# Patient Record
Sex: Female | Born: 1935 | Race: Black or African American | Hispanic: No | Smoking: Never smoker
Health system: Southern US, Community
[De-identification: ages and names within clinical notes are randomized; demographics above are authoritative.]

## PROBLEM LIST (undated history)

## (undated) DIAGNOSIS — E871 Hypo-osmolality and hyponatremia: Secondary | ICD-10-CM

## (undated) DIAGNOSIS — F32A Depression, unspecified: Secondary | ICD-10-CM

## (undated) DIAGNOSIS — K219 Gastro-esophageal reflux disease without esophagitis: Secondary | ICD-10-CM

## (undated) DIAGNOSIS — E46 Unspecified protein-calorie malnutrition: Secondary | ICD-10-CM

## (undated) DIAGNOSIS — F29 Unspecified psychosis not due to a substance or known physiological condition: Secondary | ICD-10-CM

## (undated) DIAGNOSIS — E039 Hypothyroidism, unspecified: Secondary | ICD-10-CM

## (undated) DIAGNOSIS — K5730 Diverticulosis of large intestine without perforation or abscess without bleeding: Secondary | ICD-10-CM

## (undated) DIAGNOSIS — C189 Malignant neoplasm of colon, unspecified: Secondary | ICD-10-CM

## (undated) DIAGNOSIS — Z9289 Personal history of other medical treatment: Secondary | ICD-10-CM

## (undated) DIAGNOSIS — K5792 Diverticulitis of intestine, part unspecified, without perforation or abscess without bleeding: Secondary | ICD-10-CM

## (undated) DIAGNOSIS — F329 Major depressive disorder, single episode, unspecified: Secondary | ICD-10-CM

## (undated) DIAGNOSIS — M1990 Unspecified osteoarthritis, unspecified site: Secondary | ICD-10-CM

## (undated) DIAGNOSIS — F419 Anxiety disorder, unspecified: Secondary | ICD-10-CM

## (undated) DIAGNOSIS — I10 Essential (primary) hypertension: Secondary | ICD-10-CM

## (undated) DIAGNOSIS — F919 Conduct disorder, unspecified: Secondary | ICD-10-CM

## (undated) DIAGNOSIS — I219 Acute myocardial infarction, unspecified: Secondary | ICD-10-CM

## (undated) DIAGNOSIS — K225 Diverticulum of esophagus, acquired: Secondary | ICD-10-CM

## (undated) DIAGNOSIS — K648 Other hemorrhoids: Secondary | ICD-10-CM

## (undated) DIAGNOSIS — M545 Low back pain, unspecified: Secondary | ICD-10-CM

## (undated) HISTORY — PX: GLAUCOMA SURGERY: SHX656

## (undated) HISTORY — DX: Other hemorrhoids: K64.8

## (undated) HISTORY — PX: THYROIDECTOMY: SHX17

## (undated) HISTORY — DX: Essential (primary) hypertension: I10

## (undated) HISTORY — DX: Low back pain: M54.5

## (undated) HISTORY — DX: Hypothyroidism, unspecified: E03.9

## (undated) HISTORY — PX: CATARACT EXTRACTION W/ INTRAOCULAR LENS  IMPLANT, BILATERAL: SHX1307

## (undated) HISTORY — PX: TUBAL LIGATION: SHX77

## (undated) HISTORY — DX: Diverticulosis of large intestine without perforation or abscess without bleeding: K57.30

## (undated) HISTORY — DX: Low back pain, unspecified: M54.50

---

## 1998-09-23 ENCOUNTER — Encounter: Admission: RE | Admit: 1998-09-23 | Discharge: 1998-12-22 | Payer: Self-pay | Admitting: Radiation Oncology

## 1998-10-19 ENCOUNTER — Other Ambulatory Visit: Admission: RE | Admit: 1998-10-19 | Discharge: 1998-10-19 | Payer: Self-pay | Admitting: Ophthalmology

## 2002-02-11 ENCOUNTER — Emergency Department (HOSPITAL_COMMUNITY): Admission: EM | Admit: 2002-02-11 | Discharge: 2002-02-11 | Payer: Self-pay

## 2002-02-18 ENCOUNTER — Inpatient Hospital Stay (HOSPITAL_COMMUNITY): Admission: EM | Admit: 2002-02-18 | Discharge: 2002-02-20 | Payer: Self-pay | Admitting: *Deleted

## 2002-02-19 ENCOUNTER — Encounter: Payer: Self-pay | Admitting: Cardiology

## 2002-06-12 ENCOUNTER — Encounter: Payer: Self-pay | Admitting: Internal Medicine

## 2002-06-12 ENCOUNTER — Encounter: Admission: RE | Admit: 2002-06-12 | Discharge: 2002-06-12 | Payer: Self-pay | Admitting: Internal Medicine

## 2003-09-19 HISTORY — PX: APPENDECTOMY: SHX54

## 2004-06-20 ENCOUNTER — Ambulatory Visit (HOSPITAL_COMMUNITY): Admission: RE | Admit: 2004-06-20 | Discharge: 2004-06-20 | Payer: Self-pay | Admitting: Internal Medicine

## 2004-06-30 ENCOUNTER — Encounter: Admission: RE | Admit: 2004-06-30 | Discharge: 2004-06-30 | Payer: Self-pay | Admitting: Internal Medicine

## 2004-08-25 ENCOUNTER — Ambulatory Visit: Payer: Self-pay | Admitting: Internal Medicine

## 2004-08-25 ENCOUNTER — Encounter (INDEPENDENT_AMBULATORY_CARE_PROVIDER_SITE_OTHER): Payer: Self-pay | Admitting: *Deleted

## 2004-08-25 ENCOUNTER — Encounter: Admission: RE | Admit: 2004-08-25 | Discharge: 2004-08-25 | Payer: Self-pay | Admitting: Internal Medicine

## 2004-08-25 ENCOUNTER — Inpatient Hospital Stay (HOSPITAL_COMMUNITY): Admission: AD | Admit: 2004-08-25 | Discharge: 2004-08-27 | Payer: Self-pay | Admitting: Internal Medicine

## 2004-09-08 ENCOUNTER — Ambulatory Visit: Payer: Self-pay | Admitting: Internal Medicine

## 2004-10-03 ENCOUNTER — Ambulatory Visit: Payer: Self-pay | Admitting: Internal Medicine

## 2004-11-01 ENCOUNTER — Ambulatory Visit: Payer: Self-pay | Admitting: Internal Medicine

## 2005-01-16 ENCOUNTER — Ambulatory Visit: Payer: Self-pay | Admitting: Internal Medicine

## 2005-05-08 ENCOUNTER — Ambulatory Visit: Payer: Self-pay | Admitting: Internal Medicine

## 2005-10-10 ENCOUNTER — Emergency Department (HOSPITAL_COMMUNITY): Admission: EM | Admit: 2005-10-10 | Discharge: 2005-10-10 | Payer: Self-pay | Admitting: Emergency Medicine

## 2005-10-13 ENCOUNTER — Ambulatory Visit: Payer: Self-pay | Admitting: Internal Medicine

## 2006-10-17 ENCOUNTER — Ambulatory Visit: Payer: Self-pay | Admitting: Internal Medicine

## 2006-10-17 LAB — CONVERTED CEMR LAB
BUN: 19 mg/dL (ref 6–23)
Basophils Absolute: 0 10*3/uL (ref 0.0–0.1)
Basophils Relative: 0.9 % (ref 0.0–1.0)
CO2: 28 meq/L (ref 19–32)
Calcium: 10 mg/dL (ref 8.4–10.5)
Chloride: 102 meq/L (ref 96–112)
Creatinine, Ser: 1.5 mg/dL — ABNORMAL HIGH (ref 0.4–1.2)
Eosinophils Absolute: 0 10*3/uL (ref 0.0–0.6)
Eosinophils Relative: 0.9 % (ref 0.0–5.0)
GFR calc Af Amer: 42 mL/min
GFR calc non Af Amer: 35 mL/min
Glucose, Bld: 119 mg/dL — ABNORMAL HIGH (ref 70–99)
HCT: 33.2 % — ABNORMAL LOW (ref 36.0–46.0)
Hemoglobin: 11.6 g/dL — ABNORMAL LOW (ref 12.0–15.0)
Lymphocytes Relative: 31.6 % (ref 12.0–46.0)
MCHC: 35 g/dL (ref 30.0–36.0)
MCV: 83.4 fL (ref 78.0–100.0)
Monocytes Absolute: 0.4 10*3/uL (ref 0.2–0.7)
Monocytes Relative: 7.4 % (ref 3.0–11.0)
Neutro Abs: 3.2 10*3/uL (ref 1.4–7.7)
Neutrophils Relative %: 59.2 % (ref 43.0–77.0)
Platelets: 186 10*3/uL (ref 150–400)
Potassium: 4.2 meq/L (ref 3.5–5.1)
RBC: 3.98 M/uL (ref 3.87–5.11)
RDW: 13.9 % (ref 11.5–14.6)
Sodium: 137 meq/L (ref 135–145)
TSH: 0.03 microintl units/mL — ABNORMAL LOW (ref 0.35–5.50)
WBC: 5.2 10*3/uL (ref 4.5–10.5)

## 2006-10-28 ENCOUNTER — Emergency Department (HOSPITAL_COMMUNITY): Admission: EM | Admit: 2006-10-28 | Discharge: 2006-10-28 | Payer: Self-pay | Admitting: Family Medicine

## 2007-11-27 ENCOUNTER — Encounter: Payer: Self-pay | Admitting: *Deleted

## 2007-11-27 DIAGNOSIS — H409 Unspecified glaucoma: Secondary | ICD-10-CM | POA: Insufficient documentation

## 2007-11-27 DIAGNOSIS — B029 Zoster without complications: Secondary | ICD-10-CM | POA: Insufficient documentation

## 2007-11-27 DIAGNOSIS — I428 Other cardiomyopathies: Secondary | ICD-10-CM | POA: Insufficient documentation

## 2007-11-27 DIAGNOSIS — I10 Essential (primary) hypertension: Secondary | ICD-10-CM

## 2007-11-27 DIAGNOSIS — K5730 Diverticulosis of large intestine without perforation or abscess without bleeding: Secondary | ICD-10-CM

## 2007-11-27 DIAGNOSIS — M545 Low back pain: Secondary | ICD-10-CM

## 2007-11-27 DIAGNOSIS — Z9089 Acquired absence of other organs: Secondary | ICD-10-CM

## 2007-11-27 DIAGNOSIS — K648 Other hemorrhoids: Secondary | ICD-10-CM | POA: Insufficient documentation

## 2007-11-27 DIAGNOSIS — E039 Hypothyroidism, unspecified: Secondary | ICD-10-CM | POA: Insufficient documentation

## 2007-12-14 ENCOUNTER — Ambulatory Visit: Payer: Self-pay | Admitting: Cardiology

## 2007-12-14 ENCOUNTER — Observation Stay (HOSPITAL_COMMUNITY): Admission: EM | Admit: 2007-12-14 | Discharge: 2007-12-17 | Payer: Self-pay | Admitting: General Surgery

## 2007-12-14 ENCOUNTER — Ambulatory Visit: Payer: Self-pay | Admitting: Internal Medicine

## 2007-12-16 ENCOUNTER — Encounter: Payer: Self-pay | Admitting: Internal Medicine

## 2007-12-24 ENCOUNTER — Ambulatory Visit: Payer: Self-pay | Admitting: Internal Medicine

## 2007-12-24 DIAGNOSIS — I209 Angina pectoris, unspecified: Secondary | ICD-10-CM

## 2007-12-31 ENCOUNTER — Telehealth: Payer: Self-pay | Admitting: Internal Medicine

## 2008-01-06 ENCOUNTER — Ambulatory Visit: Payer: Self-pay | Admitting: Internal Medicine

## 2008-01-07 ENCOUNTER — Ambulatory Visit: Payer: Self-pay | Admitting: Internal Medicine

## 2008-01-07 LAB — CONVERTED CEMR LAB
BUN: 25 mg/dL — ABNORMAL HIGH (ref 6–23)
CO2: 32 meq/L (ref 19–32)
Calcium: 9.7 mg/dL (ref 8.4–10.5)
Chloride: 103 meq/L (ref 96–112)
Cholesterol: 258 mg/dL (ref 0–200)
Creatinine, Ser: 1.1 mg/dL (ref 0.4–1.2)
Direct LDL: 140 mg/dL
GFR calc Af Amer: 60 mL/min
GFR calc non Af Amer: 50 mL/min
Glucose, Bld: 123 mg/dL — ABNORMAL HIGH (ref 70–99)
HDL: 98.8 mg/dL (ref >39.0)
Potassium: 3.7 meq/L (ref 3.5–5.1)
Sodium: 140 meq/L (ref 135–145)
TSH: 0.28 microintl units/mL — ABNORMAL LOW (ref 0.35–5.50)
Total CHOL/HDL Ratio: 2.6
Triglycerides: 78 mg/dL (ref 0–149)
VLDL: 16 mg/dL (ref 0–40)

## 2008-01-09 ENCOUNTER — Encounter: Payer: Self-pay | Admitting: Internal Medicine

## 2008-01-13 ENCOUNTER — Encounter: Payer: Self-pay | Admitting: Internal Medicine

## 2008-01-16 ENCOUNTER — Ambulatory Visit: Payer: Self-pay | Admitting: Cardiology

## 2008-11-03 ENCOUNTER — Encounter (INDEPENDENT_AMBULATORY_CARE_PROVIDER_SITE_OTHER): Payer: Self-pay | Admitting: *Deleted

## 2008-11-03 ENCOUNTER — Ambulatory Visit: Payer: Self-pay | Admitting: Cardiology

## 2009-05-28 ENCOUNTER — Telehealth: Payer: Self-pay | Admitting: Internal Medicine

## 2009-08-29 ENCOUNTER — Emergency Department (HOSPITAL_COMMUNITY): Admission: EM | Admit: 2009-08-29 | Discharge: 2009-08-29 | Payer: Self-pay | Admitting: Emergency Medicine

## 2009-11-03 ENCOUNTER — Emergency Department (HOSPITAL_COMMUNITY): Admission: EM | Admit: 2009-11-03 | Discharge: 2009-11-03 | Payer: Self-pay | Admitting: Emergency Medicine

## 2010-02-10 ENCOUNTER — Telehealth: Payer: Self-pay | Admitting: Internal Medicine

## 2010-07-25 ENCOUNTER — Encounter: Payer: Self-pay | Admitting: Internal Medicine

## 2010-07-25 ENCOUNTER — Inpatient Hospital Stay (HOSPITAL_COMMUNITY): Admission: EM | Admit: 2010-07-25 | Discharge: 2010-07-27 | Payer: Self-pay | Admitting: Emergency Medicine

## 2010-08-03 ENCOUNTER — Ambulatory Visit: Payer: Self-pay | Admitting: Internal Medicine

## 2010-08-03 LAB — CONVERTED CEMR LAB
Basophils Absolute: 0 10*3/uL (ref 0.0–0.1)
Basophils Relative: 0.5 % (ref 0.0–3.0)
Eosinophils Absolute: 0 10*3/uL (ref 0.0–0.7)
Eosinophils Relative: 0.5 % (ref 0.0–5.0)
HCT: 39.4 % (ref 36.0–46.0)
Hemoglobin: 13.3 g/dL (ref 12.0–15.0)
Lymphocytes Relative: 18.3 % (ref 12.0–46.0)
Lymphs Abs: 1 10*3/uL (ref 0.7–4.0)
MCHC: 33.7 g/dL (ref 30.0–36.0)
MCV: 89.2 fL (ref 78.0–100.0)
Monocytes Absolute: 0.4 10*3/uL (ref 0.1–1.0)
Monocytes Relative: 6.7 % (ref 3.0–12.0)
Neutro Abs: 4.2 10*3/uL (ref 1.4–7.7)
Neutrophils Relative %: 74 % (ref 43.0–77.0)
Platelets: 130 10*3/uL — ABNORMAL LOW (ref 150.0–400.0)
RBC: 4.42 M/uL (ref 3.87–5.11)
RDW: 13.7 % (ref 11.5–14.6)
WBC: 5.7 10*3/uL (ref 4.5–10.5)

## 2010-09-20 ENCOUNTER — Encounter: Payer: Self-pay | Admitting: Internal Medicine

## 2010-10-18 NOTE — Progress Notes (Signed)
  Phone Note Refill Request Message from:  Fax from Pharmacy on Feb 10, 2010 1:42 PM  Refills Requested: Medication #1:  LISINOPRIL-HYDROCHLOROTHIAZIDE 10-12.5 MG  TABS 1 by mouth once daily pt's last ov was april 2009, please Advise refill  Initial call taken by: Ami Bullins CMA,  Feb 10, 2010 1:43 PM  Follow-up for Phone Call        ok for as needed refills. An office vist would be nice if she is able and will get lab at that time Follow-up by: Jacques Navy MD,  Feb 10, 2010 6:34 PM    Prescriptions: LISINOPRIL-HYDROCHLOROTHIAZIDE 10-12.5 MG  TABS (LISINOPRIL-HYDROCHLOROTHIAZIDE) 1 by mouth once daily  #30 Tablet x 2   Entered by:   Bill Salinas CMA   Authorized by:   Jacques Navy MD   Signed by:   Bill Salinas CMA on 02/11/2010   Method used:   Electronically to        [facility]  [address] [phone]* (retail)       [address]       [place], [place]  [id number]       Ph: [phone] or [phone]       Fax: [phone]   RxID:   [id number]

## 2010-10-18 NOTE — Assessment & Plan Note (Signed)
Summary: post hosp   Vital Signs:  Patient profile:   77 year old female Height:      60 inches Weight:      86 pounds BMI:     16.86 O2 Sat:      97 % on Room air Temp:     97.2 degrees F oral Pulse rate:   90 / minute BP sitting:   168 / 82  (left arm) Cuff size:   regular  Vitals Entered By: Bill Salinas CMA (August 03, 2010 9:21 AM)  O2 Flow:  Room air CC: pt here for hosp follow up/ ab Comments She is no longer taking ranitidine and has no been taking oscal or ducolax/ ab   Primary Care Lillianne Eick:  Jacques Navy MD  CC:  pt here for hosp follow up/ ab.  History of Present Illness: [name] presents today for a hospital follow up after being admitted for a rule out of chest pain.  PRior to admissiopn she took 2 sublingual nitro tablets which did not resolve her chest pain.  In the hospital she was found to have a normal EKG and 3 reading of normal cardiac enzymes and an MI as the caouse of her chest pain was ruled out.  While in the hospital it was noted that she had a low Hbg.  Her base line Hbg is around 11 and she was at 8.3 at its lowest.  She also had reports of very dark stools.  It seems as though she had an acute upper GI bleed that lead to anemia that most likely caused the angina she was feeling.  In the hospital she was transfused 2 units of blood and her hemoglobin rose to 13.6.  She had no evidence of bleeding at the time of discharge and was sent home on a PPI for her acute upper GI bleed.  Now she states she is feeling well.  She has no more instances of the chest pain and has no signs of a bleed.  She denies any bloody stools or any dark stools since leaving the hospital.  She has not had any more chest pain, nor has she had any jaw pain, arm pain, or loss of sensation.  She has felt more stuffy than normal however.  She feels like her nose is congested, but has no sinus pressure.  She has been noticing some post nasal drip which irritates her  throat.  Current Medications (verified): 1)  Levothyroxine Sodium 88 Mcg  Tabs (Levothyroxine Sodium) .... Take 1 Tablet By Mouth Once A Day 2)  B Complex   Tabs (B Complex Vitamins) .... Take One Tablet Once Daily 3)  Ranitidine Hcl 150 Mg  Tabs (Ranitidine Hcl) .... Take Two Times A Day 4)  Oscal 500/200 D-3 500-200 Mg-Unit  Tabs (Calcium-Vitamin D) .... Take Once Daily 5)  Nitroquick 0.4 Mg  Subl (Nitroglycerin) .... Take Sl As Directed For Chest Pains. 6)  Isosorbide Mononitrate Cr 30 Mg  Tb24 (Isosorbide Mononitrate) .... Take 1 Tablet By Mouth Once A Day 7)  Dulcolax Stool Softener 100 Mg  Caps (Docusate Sodium) .... As Directed 8)  Aspirin 81 Mg Tbec (Aspirin) .Marland Kitchen.. 1 Tablet Daily 9)  Lisinopril-Hydrochlorothiazide 10-12.5 Mg  Tabs (Lisinopril-Hydrochlorothiazide) .Marland Kitchen.. 1 By Mouth Once Daily 10)  Sertraline Hcl 25 Mg  Tabs (Sertraline Hcl) .Marland Kitchen.. 1 Once Daily 11)  Pantoprazole Sodium 40 Mg Tbec (Pantoprazole Sodium) .Marland Kitchen.. 1 Tablet Two Times A Day  Allergies (verified): No Known Drug  Allergies  Past History:  Past Medical History: Last updated: 11/27/2007 Hx of INTERNAL HEMORRHOIDS (ICD-455.0) Hx of DIVERTICULOSIS, COLON (ICD-562.10) Hx of HERPES ZOSTER (ICD-053.9) GLAUCOMA (ICD-365.9) Hx of BACK PAIN, LUMBAR (ICD-724.2) HYPERTENSION (ICD-401.9) Hx of CARDIOMYOPATHY (ICD-425.4) HYPOTHYROIDISM (ICD-244.9)    Past Surgical History: Last updated: 11/27/2007 THYROIDECTOMY, HX OF (ICD-V45.79) APPENDECTOMY, HX OF (ICD-V45.79) TUBAL LIGATION, HX OF (ICD-V26.51)  Family History: Last updated: 12/24/2007 non-contributory  Social History: Last updated: 12/24/2007 lives alone but family stays with her.  Risk Factors: Exercise: no (12/24/2007)  Risk Factors: Smoking Status: never (11/27/2007)  Review of Systems  The patient denies anorexia, fever, weight loss, weight gain, vision loss, decreased hearing, hoarseness, chest pain, syncope, dyspnea on exertion, peripheral  edema, prolonged cough, headaches, hemoptysis, abdominal pain, melena, hematochezia, severe indigestion/heartburn, hematuria, incontinence, muscle weakness, suspicious skin lesions, difficulty walking, depression, unusual weight change, abnormal bleeding, and enlarged lymph nodes.         She denies any further chest pain, numbness or tingling in her extremities, jaw pain, or shoulder pain.  Shge has no abdominal discomfort.  She denies any history of falls.  She does report some feeling of post-nasal drip.  Physical Exam  General:  alert, well-developed, and underweight appearing.   Head:  normocephalic and atraumatic.   Eyes:  pupils equal, pupils round, pupils reactive to light, pupils react to accomodation, and corneas and lenses clear.   Ears:  R ear normal and L ear normal.   Nose:  no external deformity, no external erythema, and no nasal discharge.   Mouth:  No teeth and no longer wears dentures. pharynx pink and moist, no erythema, and no exudates.   Neck:  supple, full ROM, no masses, and no cervical lymphadenopathy.   Chest Wall:  no deformities, no tenderness, and no mass.   Lungs:  normal respiratory effort, normal breath sounds, no crackles, and no wheezes.   Heart:  normal rate, regular rhythm, no murmur, no gallop, and no rub.   Abdomen:  soft, non-tender, normal bowel sounds, no distention, no masses, no guarding, and no rigidity.   Msk:  normal ROM, no joint tenderness, no joint swelling, no joint deformities, no joint instability, and no crepitation.   Pulses:  R radial normal, R posterior tibial normal, L radial normal, and L posterior tibial normal.   Extremities:  no edema, cyanosis, or clubbing Neurologic:  alert & oriented X3, cranial nerves II-XII intact, and sensation intact to light touch.  4/5 strength in lower and upper extremity.  Gait is slow.  Uses a cane when walking. Skin:  turgor normal and color normal.   Cervical Nodes:  no anterior cervical adenopathy and  no posterior cervical adenopathy.   Psych:  Oriented X3, memory intact for recent and remote, normally interactive, good eye contact, not anxious appearing, and not depressed appearing.     Impression & Recommendations:  Problem # 1:  UNSPECIFIED ANEMIA (ICD-285.9) The anemia that she experienced was secondary to an acute upper GI bleed.  The loss of blood caused her to have anemia induced angina due to the increased work of her heart.  Her anemia was sucessfully corrected in the hospital and she seems to be having no other signs of a GI bleed nor any more angina.    Plan:  CBC today to check Hbg level           Continue PPI for another week at two times a day, then switch to once a day for a  month.  Once finished with the PPI restart Ranitidine for her acid reflux.    Orders: TLB-CBC Platelet - w/Differential (85025-CBCD) TLB-BMP (Basic Metabolic Panel-BMET) (80048-METABOL)  Patient: [name] Note: All result statuses are Final unless otherwise noted.  Tests: (1) CBC Platelet w/Diff (CBCD)   White Cell Count          5.7 K/uL                    4.5-10.5   Red Cell Count            4.42 Mil/uL                 3.87-5.11   Hemoglobin                13.3 g/dL                   96.0-45.4   Hematocrit                39.4 %                      36.0-46.0   MCV                       89.2 fl                     78.0-100.0   MCHC                      33.7 g/dL                   09.8-11.9   RDW                       13.7 %                      11.5-14.6   Platelet Count       [L]  130.0 K/uL                  150.0-400.0   Neutrophil %              74.0 %                      43.0-77.0   Lymphocyte %              18.3 %                      12.0-46.0   Monocyte %                6.7 %                       3.0-12.0   Eosinophils%              0.5 %                       0.0-5.0   Basophils %               0.5 %                       0.0-3.0   Neutrophill Absolute      4.2 K/uL  1.4-7.7   Lymphocyte Absolute       1.0 K/uL                    0.7-4.0   Monocyte Absolute         0.4 K/uL                    0.1-1.0  Eosinophils, Absolute                             0.0 K/uL                    0.0-0.7   Basophils Absolute        0.0 K/uL                    0.0-0.1  Problem # 2:  RHINITIS (ICD-472.0) She seems to be having a case of seasonal rhinitis.  She complains of nassal congestion but no sinus congestion or any signs of infection.  Plan:  If very congested can take one 30mg  tablet of pseudoephed once a day.           Watch for increase in blood pressure.    Complete Medication List: 1)  Levothyroxine Sodium 88 Mcg Tabs (Levothyroxine sodium) .... Take 1 tablet by mouth once a day 2)  B Complex Tabs (B complex vitamins) .... Take one tablet once daily 3)  Ranitidine Hcl 150 Mg Tabs (Ranitidine hcl) .... Take two times a day 4)  Oscal 500/200 D-3 500-200 Mg-unit Tabs (Calcium-vitamin d) .... Take once daily 5)  Nitroquick 0.4 Mg Subl (Nitroglycerin) .... Take sl as directed for chest pains. 6)  Isosorbide Mononitrate Cr 30 Mg Tb24 (Isosorbide mononitrate) .... Take 1 tablet by mouth once a day 7)  Dulcolax Stool Softener 100 Mg Caps (Docusate sodium) .... As directed 8)  Aspirin 81 Mg Tbec (Aspirin) .Marland Kitchen.. 1 tablet daily 9)  Lisinopril-hydrochlorothiazide 10-12.5 Mg Tabs (Lisinopril-hydrochlorothiazide) .Marland Kitchen.. 1 by mouth once daily 10)  Sertraline Hcl 25 Mg Tabs (Sertraline hcl) .Marland Kitchen.. 1 once daily 11)  Pantoprazole Sodium 40 Mg Tbec (Pantoprazole sodium) .Marland Kitchen.. 1 tablet two times a day   Orders Added: 1)  TLB-CBC Platelet - w/Differential [85025-CBCD] 2)  TLB-BMP (Basic Metabolic Panel-BMET) [80048-METABOL]

## 2010-10-20 NOTE — Medication Information (Signed)
Summary: Medications / [facility]  Medications / [facility]   Imported By: Lennie Odor 09/22/2010 14:47:17  _____________________________________________________________________  External Attachment:    Type:   Image     Comment:   External Document

## 2010-11-29 LAB — BASIC METABOLIC PANEL
BUN: 30 mg/dL — ABNORMAL HIGH (ref 6–23)
CO2: 27 mEq/L (ref 19–32)
Calcium: 9 mg/dL (ref 8.4–10.5)
Chloride: 104 mEq/L (ref 96–112)
Chloride: 106 mEq/L (ref 96–112)
Creatinine, Ser: 1.18 mg/dL (ref 0.4–1.2)
Creatinine, Ser: 1.42 mg/dL — ABNORMAL HIGH (ref 0.4–1.2)
GFR calc Af Amer: 42 mL/min — ABNORMAL LOW (ref >60)
GFR calc Af Amer: 52 mL/min — ABNORMAL LOW (ref >60)
GFR calc non Af Amer: 35 mL/min — ABNORMAL LOW (ref >60)
Glucose, Bld: 117 mg/dL — ABNORMAL HIGH (ref 70–99)
Potassium: 3.6 mEq/L (ref 3.5–5.1)
Sodium: 137 mEq/L (ref 135–145)
Sodium: 142 mEq/L (ref 135–145)

## 2010-11-29 LAB — DIFFERENTIAL
Basophils Absolute: 0 10*3/uL (ref 0.0–0.1)
Basophils Relative: 1 % (ref 0–1)
Eosinophils Absolute: 0.1 10*3/uL (ref 0.0–0.7)
Eosinophils Relative: 2 % (ref 0–5)
Lymphocytes Relative: 46 % (ref 12–46)
Lymphs Abs: 2 10*3/uL (ref 0.7–4.0)
Monocytes Absolute: 0.4 10*3/uL (ref 0.1–1.0)
Monocytes Relative: 8 % (ref 3–12)
Neutro Abs: 1.9 10*3/uL (ref 1.7–7.7)
Neutrophils Relative %: 43 % (ref 43–77)

## 2010-11-29 LAB — CARDIAC PANEL(CRET KIN+CKTOT+MB+TROPI)
CK, MB: 2.8 ng/mL (ref 0.3–4.0)
CK, MB: 3.4 ng/mL (ref 0.3–4.0)
Relative Index: INVALID (ref 0.0–2.5)
Total CK: 88 U/L (ref 7–177)
Troponin I: 0.01 ng/mL (ref 0.00–0.06)

## 2010-11-29 LAB — URINE MICROSCOPIC-ADD ON

## 2010-11-29 LAB — URINALYSIS, ROUTINE W REFLEX MICROSCOPIC
Bilirubin Urine: NEGATIVE
Glucose, UA: NEGATIVE mg/dL
Hgb urine dipstick: NEGATIVE
Ketones, ur: NEGATIVE mg/dL
Nitrite: NEGATIVE
Protein, ur: NEGATIVE mg/dL
Specific Gravity, Urine: 1.015 (ref 1.005–1.030)
Urobilinogen, UA: 0.2 mg/dL (ref 0.0–1.0)
pH: 6 (ref 5.0–8.0)

## 2010-11-29 LAB — CBC
HCT: 27.7 % — ABNORMAL LOW (ref 36.0–46.0)
Hemoglobin: 8.3 g/dL — ABNORMAL LOW (ref 12.0–15.0)
Hemoglobin: 8.9 g/dL — ABNORMAL LOW (ref 12.0–15.0)
MCH: 27.9 pg (ref 26.0–34.0)
MCHC: 32.1 g/dL (ref 30.0–36.0)
MCV: 86.8 fL (ref 78.0–100.0)
MCV: 87.5 fL (ref 78.0–100.0)
Platelets: 118 10*3/uL — ABNORMAL LOW (ref 150–400)
Platelets: 134 10*3/uL — ABNORMAL LOW (ref 150–400)
RBC: 2.95 MIL/uL — ABNORMAL LOW (ref 3.87–5.11)
RBC: 3.19 MIL/uL — ABNORMAL LOW (ref 3.87–5.11)
RDW: 14.4 % (ref 11.5–15.5)
WBC: 3.3 10*3/uL — ABNORMAL LOW (ref 4.0–10.5)
WBC: 4.3 10*3/uL (ref 4.0–10.5)

## 2010-11-29 LAB — CROSSMATCH
ABO/RH(D): O POS
DAT, IgG: NEGATIVE
Unit division: 0

## 2010-11-29 LAB — POCT CARDIAC MARKERS
CKMB, poc: 1.3 ng/mL (ref 1.0–8.0)
Myoglobin, poc: 86.1 ng/mL (ref 12–200)
Troponin i, poc: <0.05 ng/mL (ref 0.00–0.09)

## 2010-11-29 LAB — VITAMIN B12: Vitamin B-12: 302 pg/mL (ref 211–911)

## 2010-11-29 LAB — TROPONIN I: Troponin I: 0.06 ng/mL (ref 0.00–0.06)

## 2010-11-29 LAB — IRON AND TIBC
Iron: 64 ug/dL (ref 42–135)
TIBC: 337 ug/dL (ref 250–470)

## 2010-11-29 LAB — CK TOTAL AND CKMB (NOT AT ARMC)
CK, MB: 2.3 ng/mL (ref 0.3–4.0)
Relative Index: INVALID (ref 0.0–2.5)
Total CK: 78 U/L (ref 7–177)

## 2010-12-07 LAB — DIFFERENTIAL
Basophils Absolute: 0 10*3/uL (ref 0.0–0.1)
Basophils Relative: 0 % (ref 0–1)
Monocytes Relative: 9 % (ref 3–12)
Neutro Abs: 2.9 10*3/uL (ref 1.7–7.7)
Neutrophils Relative %: 57 % (ref 43–77)

## 2010-12-07 LAB — POCT CARDIAC MARKERS: Myoglobin, poc: 114 ng/mL (ref 12–200)

## 2010-12-07 LAB — AMYLASE: Amylase: 110 U/L — ABNORMAL HIGH (ref 0–105)

## 2010-12-07 LAB — COMPREHENSIVE METABOLIC PANEL
Alkaline Phosphatase: 118 U/L — ABNORMAL HIGH (ref 39–117)
BUN: 21 mg/dL (ref 6–23)
GFR calc non Af Amer: 45 mL/min — ABNORMAL LOW (ref >60)
Glucose, Bld: 123 mg/dL — ABNORMAL HIGH (ref 70–99)
Potassium: 3.6 mEq/L (ref 3.5–5.1)
Total Protein: 7.6 g/dL (ref 6.0–8.3)

## 2010-12-07 LAB — URINE MICROSCOPIC-ADD ON

## 2010-12-07 LAB — URINE CULTURE

## 2010-12-07 LAB — URINALYSIS, ROUTINE W REFLEX MICROSCOPIC
Glucose, UA: NEGATIVE mg/dL
Hgb urine dipstick: NEGATIVE
Specific Gravity, Urine: 1.014 (ref 1.005–1.030)

## 2010-12-07 LAB — LIPASE, BLOOD: Lipase: 21 U/L (ref 11–59)

## 2010-12-07 LAB — CBC
HCT: 33.2 % — ABNORMAL LOW (ref 36.0–46.0)
Hemoglobin: 11.2 g/dL — ABNORMAL LOW (ref 12.0–15.0)
MCHC: 33.6 g/dL (ref 30.0–36.0)
RDW: 14.6 % (ref 11.5–15.5)

## 2010-12-20 LAB — DIFFERENTIAL
Basophils Absolute: 0 10*3/uL (ref 0.0–0.1)
Basophils Relative: 0 % (ref 0–1)
Eosinophils Absolute: 0.1 10*3/uL (ref 0.0–0.7)
Neutro Abs: 2.6 10*3/uL (ref 1.7–7.7)
Neutrophils Relative %: 49 % (ref 43–77)

## 2010-12-20 LAB — POCT CARDIAC MARKERS
CKMB, poc: <1 ng/mL — ABNORMAL LOW (ref 1.0–8.0)
Myoglobin, poc: 81.8 ng/mL (ref 12–200)

## 2010-12-20 LAB — CBC
MCHC: 33.2 g/dL (ref 30.0–36.0)
Platelets: 153 10*3/uL (ref 150–400)
RDW: 14.9 % (ref 11.5–15.5)

## 2010-12-20 LAB — BASIC METABOLIC PANEL
BUN: 27 mg/dL — ABNORMAL HIGH (ref 6–23)
CO2: 29 mEq/L (ref 19–32)
Calcium: 9.6 mg/dL (ref 8.4–10.5)
Creatinine, Ser: 1.14 mg/dL (ref 0.4–1.2)
GFR calc Af Amer: 54 mL/min — ABNORMAL LOW (ref >60)
Glucose, Bld: 122 mg/dL — ABNORMAL HIGH (ref 70–99)

## 2011-01-08 ENCOUNTER — Other Ambulatory Visit: Payer: Self-pay | Admitting: Internal Medicine

## 2011-01-09 ENCOUNTER — Other Ambulatory Visit: Payer: Self-pay | Admitting: Internal Medicine

## 2011-01-19 ENCOUNTER — Other Ambulatory Visit: Payer: Self-pay | Admitting: Internal Medicine

## 2011-01-31 NOTE — H&P (Signed)
[name], [name] NO.:  [id number]   MEDICAL RECORD NO.:  [id number]          PATIENT TYPE:  INP   LOCATION:  3708                         FACILITY:  [facility]   PHYSICIAN:  Gordy Savers, MDDATE OF BIRTH:  October 12, 1916   DATE OF ADMISSION:  12/14/2007  DATE OF DISCHARGE:                              HISTORY & PHYSICAL   CHIEF COMPLAINT:  Chest pain.   HISTORY OF PRESENT ILLNESS:  The patient is an 77 year old African  American female without history of prior coronary artery disease.  She  was stable until approximately 4 a.m. of the day of admission when she  developed left anterior chest pain while walking to the bathroom.  This  was described as a tightness or achiness, fairly mild, and tended to wax  and wane.  After approximately 1 hour family members were called and the  patient was transported to the emergency department where pain subsided.  Associated symptoms included weakness and mild dyspnea.  She denied any  nausea or diaphoresis.  The patient was initially evaluated in the  emergency department where EKG was unremarkable and had no ischemic  changes.  Initial cardiac markers were also normal.  The patient is now  admitted for further evaluation and treatment of her chest pain  syndrome.   PAST MEDICAL HISTORY:  Medical problems include hypertension and history  of cardiomyopathy.  She has hypothyroidism, osteoarthritis, with some  chronic low back pain.  Additionally, she has diverticulosis, history of  glaucoma and remote history of herpes zoster.  Prior surgeries include  of tubal ligation and a thyroidectomy.   Medical regimen includes the following:  1. Protonix 40 mg daily.  2. Levothyroxine 0.1 mg daily.  3. Dyazide 37.5/25.  4. Alphagan ophthalmic drops 0.1% one drop to both eyes at bedtime.  5. Os-Cal plus D.  6. Meclizine 25 mg p.r.n.  7. Toprol-XL 25 mg daily.  8. Nitroglycerin 0.4 p.r.n. chest pain.   SOCIAL HISTORY:  The  patient lives with a grandson.  A daughter lives in  the area.  She is widowed.  She is a nonsmoker.   FAMILY HISTORY:  Noncontributory.  Please see chart for details   PHYSICAL EXAMINATION:  GENERAL EXAM:  Revealed an elderly female,  comfortable at rest.  VITAL SIGNS:  O2 saturation 98%, blood pressure 140/72, pulse rate 68,  temperature 98.4.  HEAD AND NECK:  Revealed arcus senilis.  Pupil responses were normal.  ENT unremarkable.  Neck revealed no bruits or neck vein distention.  No  adenopathy.  CHEST:  Clear  CARDIOVASCULAR EXAM:  Revealed normal S1, S2.  No murmurs or gallops  appreciated.  There is no tenderness along the anterior chest wall.  BREASTS:  Atrophic, no masses.  ABDOMEN:  Soft and nontender.  A lower midline scar noted.  EXTREMITIES:  No edema.  Dorsalis pedis pulses were not easily palpable,  posterior tibial pulses were present.  NEUROLOGIC EXAM:  Nonfocal.   IMPRESSION:  1. Chest pain syndrome, rule out myocardial infarction.  2. Hypertension.  3. History of cardiomyopathy.   DISPOSITION:  The patient  will be admitted to hospital.  Cardiac enzymes  will be cycled.  She will be observed in a telemetry setting.  A D-dimer  will also be checked.      Gordy Savers, MD  Electronically Signed     PFK/MEDQ  D:  12/14/2007  T:  12/15/2007  Job:  [id number]

## 2011-01-31 NOTE — Consult Note (Signed)
[name] NO.:  [id number]   MEDICAL RECORD NO.:  [id number]          PATIENT TYPE:  INP   LOCATION:  3708                         FACILITY:  [facility]   PHYSICIAN:  [name] Sidle, MD DATE OF BIRTH:  04-16-1917   DATE OF CONSULTATION:  12/17/2007  DATE OF DISCHARGE:                                 CONSULTATION   PRIMARY CARDIOLOGIST:  Dr. Rollene Rotunda.   PRIMARY CARE PHYSICIAN:  Dr. Casimiro Needle . Norins.   PATIENT PROFILE:  An 77 year old, African-American female with a prior  history of cardiomyopathy, and EF of 50% in 2005 with a negative  Myoview at that time who presented to [facility] March 28 with chest  pain.   PROBLEM LIST:  1. Chest pain.      a.     Status post Cardiolite February 2005 showing no ischemia.  2. History of nonischemic cardiomyopathy.      a.     EF was 50% in 2005.      b.     December 16, 2007 2-D echocardiogram EF 55% with hypokinesis       and basal inferoposterior wall, mild diastolic dysfunction.  Mild       AI.  Moderate MR.  3. Moderate MR.  4. Hypothyroidism.  5. Hypertension.  6. Prior history of hyperlipidemia.  7. Osteoarthritis.  8. Diverticulosis.  9. Glaucoma.  10.Herpes zoster.  11.GERD.  12.Status post tubal ligation.  13.Status post thyroidectomy now on replacement.   HISTORY OF PRESENT ILLNESS:  An 77 year old, African-American female  with the above problem list.  She presented to [facility] March 28  following a several month history of both rest and exertional left chest  dull aching with occasional radiation to the left shoulder.  When  occurring with exertion, symptoms may be associated with dyspnea and  relieved with rest. She had recurrence of symptoms early in the a.m. of  March 28 prompting her to present to the ED later that day.  She has  since ruled out for MI.  Her D-dimer was elevated at 1.05 and a VQ scan  was performed and was felt be low probability.  Chest x-ray showed no  acute  process.  She underwent a 2-D echocardiogram yesterday which  showed EF of 55% however, there was hypokinesis in the basal  inferoposterior wall with mild diastolic dysfunction.  We have been  asked to consult with regards to additional cardiac evaluation and  management.  Currently the patient is pain free and upon discussion with  the patient and her granddaughter, they would prefer to pursue medical  therapy rather than any aggressive measures such as cardiac  catheterization.   ALLERGIES:  No known drug allergies.   CURRENT MEDICATIONS:  1. Aspirin 325 mg daily.  2. Brimonidine eyedrops 1 drop both eyes q.h.s.  3. Lovenox 30 mg subcu daily.  4. Colace 100 mg daily.  5. Synthroid 88 mcg daily.  6. Protonix 40 mg daily.  7. K-Dur 20 mEq daily.  8. Tylenol p.r.n.  9. Nitroglycerin p.r.n.   FAMILY HISTORY:  Noncontributory.  SOCIAL HISTORY:  She lives in [place].  Her grandson lives with her.  She is retired.  She is widowed.  She denies any tobacco, alcohol or  drug use and does not routinely exercise.   REVIEW OF SYSTEMS:  Positive for chest pain and occasional dyspnea on  exertion as outlined in the HPI.  Otherwise all systems reviewed and  negative.   PHYSICAL EXAM:  Temperature 98.7, heart rate 55, respirations 18, blood  pressure 134/66, pulse ox 100% on room air.  A pleasant, African-American female in no acute distress, awake, alert  and oriented x3.  HEENT:  Normal.  NEUROLOGIC:  Grossly intact, nonfocal.  SKIN:  Warm and dry without lesions or masses.  NECK:  No JVD or bruits.  LUNGS:  Respirations regular and unlabored.  Clear to auscultation.  CARDIAC:  Regular S1 and S2.  No S3, S4 or  murmurs.  ABDOMEN:  Round, soft, nontender, nondistended.  Bowel sounds present  x4.  EXTREMITIES:  Warm, dry, no clubbing, cyanosis or edema.  Dorsalis  pedis and posterior tibial pulses 2+ and equal bilaterally.   ACCESSORY CLINICAL FINDINGS:  EKG shows sinus bradycardia  at a rate of  50 and normal axis, no acute ST-T changes and no Qs.  Chest x-ray from  March 28 showed COPD and cardiomegaly without pulmonary edema.  VQ scan  was low probability for PE.   LABORATORY DATA:  Hemoglobin 11.0, hematocrit 33.3, WBC 5.3, platelets  180.  Sodium 138, potassium 3.9, chloride 105, CO2 28, BUN 29,  creatinine 1.13, glucose 86. Total bilirubin 0.7, alkaline phosphatase  70, AST 32, ALT 23, total protein 6.9, albumin 3.9.  Cardiac markers  negative x3.  Calcium 9.2.   ASSESSMENT/PLAN:  1. Chest pain. The patient has a several month history of      resting/exertional chest discomfort relieved spontaneously or with      rest when occurring with exertion.  Her cardiac markers are      negative and VQ scan is low probability.  She most likely has      underlying coronary artery disease with angina, and this was      clearly explained to the patient and her granddaugher.  The patient      prefers medical management rather than any aggressive cardiac      evaluation.  She did have a negative Cardiolite in 2005.  We will      add Imdur 30 mg daily and also continue her aspirin. Would      recommend follow-up of lipids and LFTs as an outpatient and      consideration of adding a statin.  She unfortunately becomes fairly      bradycardic on beta blocker therapy and therefore we would agree in      continuing to hold back. We will arrange a follow-up with Dr.      Antoine Poche who she has seen in consultation in the past.  2. Hypertension, stable.  Will follow this on long-acting nitrates.  3. Lipid status currently unknown.  Recommend outpatient lipids and      LFTs with low threshold to initiate statin therapy.      Nicolasa Ducking, ANP      [name] Sidle, MD  Electronically Signed    CB/MEDQ  D:  12/17/2007  T:  12/17/2007  Job:  [id number]

## 2011-01-31 NOTE — Discharge Summary (Signed)
[name], [name]              ACCOUNT NO.:  [id number]   MEDICAL RECORD NO.:  [id number]          PATIENT TYPE:  OBV   LOCATION:  3708                         FACILITY:  [facility]   PHYSICIAN:  Valerie A. Felicity Coyer, MDDATE OF BIRTH:  1916/12/07   DATE OF ADMISSION:  12/14/2007  DATE OF DISCHARGE:  12/17/2007                               DISCHARGE SUMMARY   DISCHARGE DIAGNOSES:  1. Chest pain.  2. Hypothyroidism with depressed TSH.  Plan to decrease Levoxyl and      recheck TFTs in 4-6 weeks as an outpatient.  3. Bradycardia in setting of beta-blocker use.  4. Hypertension.   HISTORY OF PRESENT ILLNESS:  [name] is an 77 year old African  American female admitted on December 14, 2007, with chief complaint of  chest pain.  She was stable until approximately 4:00 a.m. on the day of  admission; at which time, she developed left anterior chest pain while  walking to the bathroom.  She described it as a tightness or achiness,  which was fairly mild and tended to wax and wane and lasted for  approximately 1 hour.  Family members were called and the patient was  transported to the emergency department where the pain subsided.  She  also noted weakness and mild dyspnea but denied any nausea or  diaphoresis associated with that chest pain.  She was initially  evaluated in the emergency department for EKG was unremarkable and had  no ischemic changes.  Initial cardiac markers were normal.  She is  admitted for further evaluation and treatment of her chest pain.   PAST MEDICAL HISTORY:  1. Hypertension.  2. History of cardiomyopathy.  3. Hypothyroidism.  4. Osteoarthritis.  5. Chronic low back pain  6. Diverticulosis.  7. Glaucoma.  8. Remote history of herpes zoster.  9. History of tubal ligation.  10.Status post thyroidectomy.   COURSE OF HOSPITALIZATION:  Chest pain.  The patient was admitted and  underwent serial cardiac enzymes which were negative.  2D echo noted a  normal LV  function with a left ventricular ejection fraction of 55%.  It  did note hypokinesis of the basal inferoposterior wall with mild  diastolic dysfunction, moderate mitral regurg.  The patient was noted to  have bradycardia during this admission on telemetry and her beta-blocker  was discontinued.  A Cardiology consult was requested and the patient  was seen by Dr. Nona Dell.  As the patient's family are refusing  any invasive workup, plan at this time is to treat medically including  nitrate, aspirin, and statin.  However, beta-blocker will not be  continued at time of discharge due to bradycardia during this admission.  She has been scheduled to see Dr. Antoine Poche in the clinic.  We do not  have any current fasting lipid profile on this patient.  This will need  to be checked as an outpatient and statin started as indicated.  Also,  she is being started on nitrate and her blood pressure is 134/66, off  her triamterene and hydrochlorothiazide.  We will continue her off this  medication at the time of  discharge to avoid hypotension.   MEDICATIONS:  At the time of discharge:  1. Aspirin 325 mg p.o. daily.  2. Alphagan 0.2% eye drops to both eyes nightly.  3. Os-Cal plus D 1 tab twice daily.  4. Levothyroxine 88 mcg once daily.  5. Imdur 30 mg p.o. daily.  6. Nitroglycerin 0.4 mg under tongue as needed for chest pain.  7. Ranitidine 150 mg p.o. b.i.d.  8. B complex 1 tablet p.o. daily.   LABORATORY DATA:  At the time of discharge, BUN 29, creatinine 1.13.  V/Q scan low probability for PE.  D-dimer 1.05.  TSH 0.125.   DISPOSITION:  The patient will be discharged to home.   FOLLOWUP:  She is scheduled to follow up with Dr. Illene Regulus on  December 24, 2007, at 3:50 p.m.  She will need to have a fasting lipid  profile performed and follow up of her blood pressure.  She is also  scheduled to follow up with Dr. Antoine Poche on January 15, 2008, at 9:15 a.m.  In addition, she needs to follow up  thyroid function test in 4-6 weeks.      Sandford Craze, NP      Raenette Rover. Felicity Coyer, MD  Electronically Signed    MO/MEDQ  D:  12/17/2007  T:  12/18/2007  Job:  [id number]   cc:   Rosalyn Gess. Norins, MD  Rollene Rotunda, MD, [facility]

## 2011-01-31 NOTE — Assessment & Plan Note (Signed)
[facility]                            CARDIOLOGY OFFICE NOTE   NAME:[name], [name]                       MRN:          [id number]  DATE:01/16/2008                            DOB:          1916-12-23    PRIMARY CARE PHYSICIAN:  [name] Gess. Norins, MD.   REASON FOR PRESENTATION:  Evaluate patient with chest pain.   HISTORY OF PRESENT ILLNESS:  The patient was recently in the hospital  for evaluation of chest pain.  We saw her in consultation.  She had some  resting chest discomfort.  Her D-dimer was slightly elevated.  However,  V/Q scan was low probability.  An echocardiogram demonstrated an EF of  55%.  There was hypokinesis in the inferoposterior wall with mild  diastolic dysfunction.  She had some moderate mitral regurgitation.  The  patient did not want further evaluation.  She was sent home with Imdur  and a new prescription for sublingual nitroglycerin.  She says she has  had to use the nitroglycerin one time for chest discomfort.  The chest  discomfort went away after this.  She has otherwise been active at home,  making the beds and doing light housekeeping.  With this she denies any  chest pressure, neck or arm discomfort.  She has had no palpitations,  presyncope or syncope.  She has had no PND or orthopnea.   PAST MEDICAL HISTORY:  Mildly reduced ejection fraction (EF 50% in 2005,  negative Myoview at that time), moderate mitral regurgitation,  hypothyroidism, hypertension, hyperlipidemia, osteoarthritis,  diverticulosis, glaucoma, herpes zoster, gastroesophageal reflux  disease, tubal ligation, thyroidectomy.   ALLERGIES:  None.   MEDICATIONS:  1. Sertraline 25 mg p.r.n.  2. Aspirin 81 mg daily.  3. Isosorbide 30 mg daily.  4. Calcium.  5. Lisinopril/HCT 20/12.5 daily.  6. Levothyroxine 88 mcg daily.  7. Stool softener.  8. Ranitidine 150 mg b.i.d.  9. Vitamin B complex.   REVIEW OF SYSTEMS:  As stated in the HPI and otherwise  negative for  other systems.   PHYSICAL EXAMINATION:  GENERAL:  The patient is frail but in no  distress.  VITAL SIGNS:  Blood pressure 164/78, heart rate 65 and regular, weight  91 pounds, body mass index 18.  NECK:  No jugular venous distention at 45 degrees.  Carotid upstroke  brisk and symmetrical.  No bruits, no thyromegaly.  LUNGS:  Clear to auscultation bilaterally.  BACK:  No costovertebral angle tenderness.  CHEST:  Unremarkable.  HEART:  PMI not displaced or sustained.  S1 and S2 within normal limits.  No S3 or S4, no clicks, rubs, murmurs.  ABDOMEN:  Flat, positive bowel sounds, normal in frequency pitch, no  bruits, rebound, guarding or midline pulsatile mass, organomegaly.  SKIN:  No rashes, no nodules.  EXTREMITIES:  2+ pulses, no edema.   EKG - sinus rhythm, rate 65, axis within normal limits, intervals within  normal limits, early transition lead V2, no acute ST-wave change.   ASSESSMENT AND PLAN:  1. Chest discomfort.  The patient may well have coronary disease but  wants conservative therapy.  She has only had one nitroglycerin      since she has been out of the hospital.  She is otherwise not      limited by any chest discomfort.  At this point I will make no      change to her medical regimen.  She will continue with risk      reduction.  2. Hypertension.  I note that her blood pressure medicine -      lisinopril/HCT - was recently changed.  The dose may have been      reduced.  I do not have these notes from Dr. Debby Bud.  Her home      health nurse does not report any high blood pressures.  Therefore,      I am going to assume that her blood pressures are not running high      typically and make no change to her regimen.  The home health nurse      can keep an eye on this.  3. Followup.  I will see the patient back in 6 months or sooner if      needed.     Rollene Rotunda, MD, [facility]  Electronically Signed    JH/MedQ  DD: 01/16/2008  DT: 01/16/2008   Job #: [id number]   cc:   [name] Gess. Norins, MD

## 2011-01-31 NOTE — Assessment & Plan Note (Signed)
[facility]                            CARDIOLOGY OFFICE NOTE   NAME:Benkert, Bonita                       MRN:          [id number]  DATE:11/03/2008                            DOB:          [birth year]    PRIMARY CARE PHYSICIAN:  Rosalyn Gess. Norins, MD   REASON FOR PRESENTATION:  Evaluate the patient with chest pain and  hypertension.   HISTORY OF PRESENT ILLNESS:  The patient returns for followup.  It has  been a little less than a year since I last saw her.  She has had no new  cardiovascular problems since I last saw her.  She denies any new chest  discomfort, neck or arm discomfort.  She has had no palpitations,  presyncope, or syncope.  She has had no PND or orthopnea.  She has  followed routinely with Dr. Debby Bud.   PAST MEDICAL HISTORY:  1. Mildly reduced ejection fraction (EF 50% in 2005 with a negative      Myoview at that time.  Her EF was 55% in March 2009 by echo).  2. Moderate mitral regurgitation.  3. Hypothyroidism.  4. Hypertension.  5. Hyperlipidemia.  6. Osteoarthritis.  7. Diverticulosis.  8. Glaucoma.  9. Herpes zoster.  10.Gastroesophageal reflux disease.  11.Tubal ligation.  12.Thyroidectomy.   ALLERGIES:  None.   MEDICATIONS:  1. Sertraline.  2. Aspirin.  3. Isosorbide 30 mg daily.  4. Calcium.  5. Lisinopril/HCTZ 10/12.5 daily.  6. Levothyroxine 88 mcg daily.  7. Stool softener.  8. Ranitidine 150 mg b.i.d.  9. Vitamin B.  10.Aspirin 325 mg daily.   REVIEW OF SYSTEMS:  As stated in the HPI and otherwise negative for  other systems.   PHYSICAL EXAMINATION:  GENERAL:  The patient is in no distress.  VITAL SIGNS:  Blood pressure 164/76, heart rate 97 and regular, weight  88 pounds, and body mass index 17.  HEENT:  Eyelids unremarkable.  Pupils equal, round, and reactive to  light.  Fundi not visualized.  Oral mucosa unremarkable.  NECK:  No jugular venous distention at 45 degrees; carotid upstroke  brisk and  symmetric; no bruits; no thyromegaly.  LYMPHATICS:  No adenopathy.  LUNGS:  Clear to auscultation bilaterally.  BACK:  No costovertebral angle tenderness.  CHEST:  Unremarkable.  HEART:  PMI not displaced or sustained; S1 and S2 within normal limits;  no S3, no S4; no clicks, no rubs, no murmurs.  ABDOMEN:  Flat; positive bowel sounds, normal in frequency and pitch; no  bruits; no rebound; no guarding; no midline pulsatile mass; no  hepatomegaly; no splenomegaly.  SKIN:  No rashes.  No nodules.  EXTREMITIES:  Pulses 2+ throughout; no edema, no cyanosis, no clubbing.  NEURO:  Oriented to person, place, and time; cranial nerves II through  XII grossly intact, motor grossly intact.   EKG:  Sinus rhythm, left axis deviation, mild voltage criteria for left  ventricular hypertrophy, questionable left atrial enlargement.  Aside  from increased rate and increased voltage, there is no significant  change from previous EKGs.   ASSESSMENT AND PLAN:  1. Hypertension.  The patient's blood pressure is elevated as it was      at the last appointment.  At this point, I am not sure whether this      is a white-coat problem or not.  It has been well controlled at      some visits.  Her granddaughter is a Agricultural engineer, and I have      asked her to keep a blood pressure diary at home.  She agrees to do      this and we can manage this further based on this report.  I      discussed that the risk of stroke could increase with increasing      systolic blood pressure and they understand the importance.  She is      also going to avoid salt as she does use added salt.  2. Cardiomyopathy.  The patient had a mildly reduced ejection fraction      but no symptoms related to this.  She had some moderate mitral      regurgitation.  However, this would be managed medically at her      age.  Therefore, no further evaluation is warranted.  3. Followup.  I will see the patient again as needed.     Rollene Rotunda, MD, [facility]  Electronically Signed    JH/MedQ  DD: 11/03/2008  DT: 11/04/2008  Job #: [id number]   cc:   Rosalyn Gess. Norins, MD

## 2011-02-03 NOTE — Discharge Summary (Signed)
NAMEMALAK, DUCHESNEAU              ACCOUNT NO.:  [id number]   MEDICAL RECORD NO.:  [id number]          PATIENT TYPE:  INP   LOCATION:  5705                         FACILITY:  [facility]   PHYSICIAN:  Rene Paci, M.D. LHCDATE OF BIRTH:  1917/06/29   DATE OF ADMISSION:  08/25/2004  DATE OF DISCHARGE:  08/27/2004                                 DISCHARGE SUMMARY   DISCHARGE DIAGNOSIS:  '  Acute appendicitis.   BRIEF ADMISSION HISTORY:  Ms. Farrey is an 77 year old African-American  female who reported sudden onset of acute right lower quadrant/right flank  pain.  Duration was 20-30 minutes.  After that, she had intermittent pain.  She denies fever, chills, diarrhea or nausea and vomiting.  The patient was  sent for a CT for a renal stone protocol which revealed acute appendicitis.  The patient was admitted for surgical consultation.   PAST MEDICAL HISTORY:  1.  Hypothyroid disease.  2.  Hypertension.  3.  Glaucoma.  4.  Status post thyroidectomy in 1970.  5.  History of tubal ligation in 1958.  6.  History of herpes zoster.  7.  History of a normal adenosine Cardiolite in February of 2005.   HOSPITAL COURSE:  Problem 1.  GASTROINTESTINAL:  The patient presented with  acute appendicitis.  She was evaluated by Dr. Ezzard Standing.  She underwent a  laparoscopic appendectomy on August 25, 2004.  The patient progressed to  the point of being stable for discharge.   Problem 2.  INFECTIOUS DISEASE:  The patient remained afebrile with a normal  white count.  The patient was empirically started on Zosyn but this was  discontinued after her appendectomy.   Problem 3.  NORMOCYTIC ANEMIA:  She remained hemodynamically stable.   DISCHARGE LABORATORY DATA:  White count 6.7, hemoglobin 9.9, BMET was normal  except for a glucose of 124.   DISCHARGE MEDICATIONS:  1.  Synthroid 112 mcg daily.  2.  Ranitidine 150 mg b.i.d.  3.  Maxzide/HCTZ 37.5/25 mg daily.  4.  Calcium +D 500 mg daily.  5.   B-Complex vitamin daily.   FOLLOW UP:  With Dr. Ezzard Standing in 2 weeks and Dr. Debby Bud as needed.      Laur   LC/MEDQ  D:  08/26/2004  T:  08/27/2004  Job:  [id number]   cc:   Rosalyn Gess. Norins, M.D. [facility]   Sandria Bales. Ezzard Standing, M.D.  [address]  [place]  [place] [address]  Fax: [phone]

## 2011-02-03 NOTE — Consult Note (Signed)
[name], [name]              ACCOUNT NO.:  [id number]   MEDICAL RECORD NO.:  [id number]          PATIENT TYPE:  INP   LOCATION:  5705                         FACILITY:  [facility]   PHYSICIAN:  Sandria Bales. Ezzard Standing, M.D.  DATE OF BIRTH:  Feb 06, 1917   DATE OF CONSULTATION:  08/25/2004  DATE OF DISCHARGE:                                   CONSULTATION   HISTORY OF PRESENT ILLNESS:  This is an 77 year old black female patient of  Dr.  Illene [name] who, approximately Monday, December 5, started  developing vague abdominal pain which is localized to her right abdomen.  Dr. Debby Bud was concerned about her possibly having kidney stones and  obtained a CT scan on her today with a renal stone protocol, however, this  showed acute appendicitis.  [name] denies any prior gastrointestinal  problems.  She denies a history of peptic ulcer disease, liver disease,  gallbladder disease, pancreatic disease, colon disease.  Her only abdominal  surgery was a tubal ligation many years ago.   ALLERGIES:  She has no allergies.   MEDICATIONS:  Calcium 500 mg, Ranitidine 150 mg b.i.d.,  Triamterene/hydrochlorothiazide 37.5/25, Levothyroxine 112 mcg daily, she is  on vitamin E and C.  She also has some eyedrops for her eyes.   REVIEW OF SYMPTOMS:  NEUROLOGIC:  No seizures or loss of consciousness.  PULMONARY:  No history of pneumonia or tuberculosis.  CARDIAC:  She apparently had a Cardiolite test about a year ago which was  reported as negative.  She has had no chest pain or angina.  No cardiac  catheterization.  GASTROINTESTINAL:  See history of present illness.  UROLOGIC:  No history of kidney stones or kidney infections.  GYN:  She has had 11 children, three are dead.  She lives with one of her grandsons.  In her room are two of her daughters.   PHYSICAL EXAMINATION:  VITAL SIGNS:  She is afebrile, pulse 100.  GENERAL:  She is a well nourished older black female.  HEENT:  Unremarkable.  LUNGS:   Clear to auscultation.  HEART:  Regular rate and rhythm without murmur or rub.  ABDOMEN:  Soft but is sore and tender in the right abdomen.  She has a well  healed lower midline scar from a prior tubal ligation.  She does have some  mild guarding.  EXTREMITIES:  She has good strength in her upper and lower extremities.   LABORATORY DATA:  White blood count 10,000, hemoglobin 10, hematocrit 33.  Sodium 135, potassium 4, chloride 97, CO2 27, glucose 126, BUN 29,  creatinine 1.6.  Again, I reviewed her CT scan which did show a thickened appendix consistent  with appendicitis.   IMPRESSION:  1.  Acute appendicitis.  I discussed with the patient and her daughters that      of proceeding with appendectomy.  I discussed the indications and      potential complications of appendectomy.  The potential complications      include but are not limited to bleeding, infection, possibly of open      surgery, and possibly of  a bowel injury.  2.  Hypothyroidism corrected.  3.  Cardiomyopathy.  4.  History of hypertension.      [name]   DHN/MEDQ  D:  08/25/2004  T:  08/25/2004  Job:  [id number]   cc:   [name], M.D. [facility]

## 2011-02-03 NOTE — Discharge Summary (Signed)
[name], [name]              ACCOUNT NO.:  [id number]   MEDICAL RECORD NO.:  [id number]          PATIENT TYPE:  INP   LOCATION:  5705                         FACILITY:  [facility]   PHYSICIAN:  Rosalyn Gess. Norins, M.D. LHCDATE OF BIRTH:  1917/08/21   DATE OF ADMISSION:  08/25/2004  DATE OF DISCHARGE:  08/27/2004                                 DISCHARGE SUMMARY   ADMISSION DIAGNOSIS:  Acute appendicitis.   DISCHARGE DIAGNOSES:  Appendicitis, resolved after laparoscopic  appendectomy.   HISTORY OF PRESENT ILLNESS:  Ms. Halfhill was admitted on August 25, 2004,  after presenting to the office with acute abdominal pain.  Outpatient CT  scan revealed acute appendicitis.  She was subsequently admitted to the  hospital.   HOSPITAL COURSE:  The patient was admitted to the hospital and started on IV  fluids and IV antibiotics with Zosyn 3.375 mg IV q.6h.  She was seen in  consultation by Sandria Bales. Ezzard Standing, M.D., soon after admission and was taken to  the operating room two-and-a-half hours after admission for laparoscopic  appendectomy.  The patient did very well with surgery.  She has been able to  take a diet.  She has had flatus and bowel movement.  At this point with the  problem resolved, she is stable and ready for discharge home.   DISPOSITION:  The patient is discharged home.   DISCHARGE MEDICATIONS:  She is to continue on all of her home medications.   FOLLOWUP:  She is to follow up with Sandria Bales. Ezzard Standing, M.D., in two weeks.   The patient is to call in the interval if she has any problems for  discomfort.       MEN/MEDQ  D:  08/27/2004  T:  08/28/2004  Job:  [id number]   cc:   Sandria Bales. Ezzard Standing, M.D.  [address]  [place]  [place] [address]  Fax: [phone]   [facility]

## 2011-02-03 NOTE — Op Note (Signed)
[name], [name]              ACCOUNT NO.:  [id number]   MEDICAL RECORD NO.:  [id number]          PATIENT TYPE:  INP   LOCATION:  5705                         FACILITY:  [facility]   PHYSICIAN:  Sandria Bales. Ezzard Standing, M.D.  DATE OF BIRTH:  22-Oct-1916   DATE OF PROCEDURE:  08/25/2004  DATE OF DISCHARGE:                                 OPERATIVE REPORT   PREOPERATIVE DIAGNOSIS:  Acute appendicitis.   POSTOPERATIVE DIAGNOSIS:  Acute appendicitis.   PROCEDURE:  Laparoscopic appendectomy.   SURGEON:  Sandria Bales. Ezzard Standing, M.D.  No first assistant.   ANESTHESIA:  General endotracheal.   INDICATION FOR PROCEDURE:  Ms. Hilmer is an 77 year old black female who had  symptomatic appendicitis both on physical exam and by CT scan.  Discussion  about her and her daughters about proceeding with appendectomy tonight.  Indications and potential complications explained to the patient.   OPERATIVE NOTE:  The patient placed in the supine position.  She was on  Zosyn as a preoperative antibiotic.  She had a Foley catheter in place.  The  abdomen was prepped with Betadine solution and sterilely draped, and her  anesthesia was supervised by Kaylyn Layer. Michelle Piper, M.D.  An infraumbilical  incision was made with sharp dissection and carried down to the abdominal  cavity.  A 0 degree laparoscope was inserted through the 12 mm Hasson trocar  at the umbilicus.  I placed a second 10 mm trocar in the left lower quadrant  and a 5 mm trocar in the right upper quadrant.   I identified the appendix, which ran retrocecal up toward the tip of the  right lobe of the liver, so it really went up the right colonic gutter.  I  was able to mobilize the appendix.  I took the mesentery down using a  Harmonic scalpel.  The base of the appendix actually felt thickened, so I  actually had to take out a little piece of the cecum with the appendix.  I  did four firings across this area.  Again, this was a 35 stapler, which  takes kind of tiny  bites.  The appendix was removed, put into an EndoCatch  bag and delivered through the umbilicus.  I then reinspected the cecum.  I  irrigated it.  I thought that I had a good staple line with no evidence of  any leakage, and I had not compromised the terminal ileum at all.   I used about 500 mL of saline for irrigation.  I then closed the umbilical  incision with a 0 Vicryl suture.  I closed the left lower quadrant incision  with a 0 Vicryl suture.  I closed each skin wound with a 5-0 Vicryl suture,  painted with tincture of Benzoin and steri-stripped it.   The patient tolerated the procedure well, was transported to the recovery  room in good condition.  Sponge and needle count were correct at the end of  the case.      Davi   DHN/MEDQ  D:  08/25/2004  T:  08/26/2004  Job:  [id number]   cc:  Rosalyn Gess Norins, M.D. [facility]

## 2011-02-03 NOTE — Discharge Summary (Signed)
[place]  Patient:    [name], [name] Visit Number: [id number] MRN: [id number]          Service Type: MED Location: [phone] Attending Physician:  Rollene Rotunda Dictated by:   Lavella Hammock, P.A.-C. Admit Date:  02/18/2002 Disc. Date: 02/20/02   CC:         Rosalyn Gess. Norins, M.D. [facility]   Referring Physician Discharge [place]  DATE OF BIRTH:  [birth year]  PROCEDURES:  Adenosine Cardiolite.  HOSPITAL COURSE:  [name] is an 77 year old female with no known history of coronary artery disease but has occasional left arm pain.  She was seen in the emergency room approximately a week ago for symptoms that included dizziness and palpitations.  She was treated for a urinary tract infection and told she was dehydrated.  She had a presyncopal spell while being placed on a Holter monitor, and a pulse was not palpable at that time.  Once the patient was on the Holter monitor, her heart rate was 60.  She was admitted to the hospital for further evaluation and treatment.  Her enzymes were negative for MI, and she had an adenosine Cardiolite on February 20, 2002.  The Cardiolite showed an EF of 59% with no infarct or ischemia. No further ischemic work-up was indicated at this time.  During her hospital stay, the patient had no significant arrhythmias and no further symptoms.  She denies any orthostatic symptoms as well.  Her TSH was checked and was low; therefore, her Synthroid was decreased from 125 to 100 mcg daily after discussion with Dr. Debby Bud.  She is to get a TSH checked in three weeks and follow up with Dr. Debby Bud after that.  By February 20, 2002, the patient was being seen by the mobility team and ambulating without difficulty, although she was a little weak.  She does not live alone, and her son and daughter are able to take care of her.  She is considered stable for discharge on February 20, 2002.  LABORATORY VALUES:  Sodium 135, potassium 4.1,  chloride 98, CO2 29, BUN 23, creatinine 1.5, glucose 134.  Hemoglobin 12.5, hematocrit 37.6, WBC 6.4, platelets 199.  TSH 0.064.  Serial CK-MB and troponin I negative for MI.  Abdominal ultrasound:  Liver, gallbladder, spleen visualized and portions of the pancreas are unremarkable.  There is an atheromatous abdominal aorta measuring up to 18 mm in diameter.  There is a 2 cm simple cyst involving the lower pole of the right kidney.  The right kidney measures 8.4 cm, left 8. cm. Common bile duct is normal in caliber.  Adenosine Cardiolite:  No evidence of reversible or fixed myocardial perfusion defect to suggest the presence of ischemia or scar.  No evidence of regional left ventricular wall motion abnormality.  EF 59%.  DISCHARGE CONDITION:  Stable.  DISCHARGE DIAGNOSES: 1. Palpitations/presyncope:  The patient is to pick up an event monitor in the    office, and they have been contacted.  They will call her.  She is to    follow up with Dr. Antoine Poche after the event monitor is completed.  She is    to follow up sooner on a p.r.n. basis. 2. Hypothyroidism:  The patients medication has been adjusted, and she is to    get a TSH check on June 23 and see Dr. Debby Bud after that. 3. Glaucoma, continue home medications. 4. Family history of coronary artery disease, 5. Premature atrial contractions. 6.  Palpitations.  DISCHARGE INSTRUCTIONS: 1. Her activity level is to be as tolerated. 2. She is to stick to a low fat diet. 3. She is to get an event monitor, and the office will call. 4. She is to get a TSH on March 10, 2002. 5. She is to follow up with Dr. Antoine Poche on April 14, 2002, at 4:30 p.m. 6. She is to see Dr. Debby Bud in three weeks.  DISCHARGE MEDICATIONS 1. Synthroid 100 mcg q.d. 2. Coated aspirin 325 mg q.d. 3. Maxzide 37.25/25 mg q.d. 4. Centrum multivitamin q.d. 5. Alphagan eye q.t.t. b.i.d. 6. Ativan 1 mg p.r.n. 7. She is not to take the Synthroid she has at home and only  take the new    prescription. Dictated by:   Lavella Hammock, P.A.-C. Attending Physician:  Rollene Rotunda DD:  02/20/02 TD:  02/20/02 Job: [id number]

## 2011-02-03 NOTE — H&P (Signed)
[name], [name]              ACCOUNT NO.:  [id number]   MEDICAL RECORD NO.:  [id number]          PATIENT TYPE:  INP   LOCATION:  5705                         FACILITY:  [facility]   PHYSICIAN:  Rosalyn Gess. Norins, M.D. LHCDATE OF BIRTH:  06/27/1917   DATE OF ADMISSION:  08/25/2004  DATE OF DISCHARGE:                                HISTORY & PHYSICAL   CHIEF COMPLAINT:  Abdominal pain.   HISTORY OF PRESENT ILLNESS:  Ms. Twiggs is a very pleasant 77 year old  African American woman who reported onset suddenly last night of acute right  lower quadrant abdominal pain and right flank pain with a duration of 20 to  30 minutes. Following this she had intermittent pain and was seen in the  office today for this problem. She denies any fever, sweats, or chills. She  has had no diarrhea, nausea, or vomiting. She had no prior GI problems. She  has no history of nephrolithiasis. Because of her age and symptoms, her  leading diagnosis was possible nephrolithiasis. The patient had laboratories  in the office and then was sent to [facility] for outpatient CT with  renal stone protocol. The study was read out as showing acute appendicitis  with no perforation or abscess. Of note the patient's white blood cell count  in the office was 10,000 and she was afebrile. She is now admitted for IV  antibiotics and surgical consult.   PAST MEDICAL HISTORY:  Surgical: (1) tubal ligation in 1958; (2)  thyroidectomy in 1970. Medical illnesses: (1) the patient had the usual  childhood diseases; (2) hypothyroid disease; (3) hypertension: (4)  cardiomyopathy with an EF of 50% with no evidence of ischemic heart disease;  (5) history of glaucoma; (6) iatrogenic hypothyroid disease; (7) history of  zoster.   CURRENT MEDICATIONS:  1.  Maxzide 37.5/25 daily.  2.  Synthroid 112 mcg daily.  3.  Ranitidine 150 mg q.h.s.  4.  Alphagan eye drops.  5.  B-complex vitamins.   ALLERGIES:  The patient has no known  drug allergies.   HABITS:  Alcohol--none.  Tobacco--none.   FAMILY HISTORY:  Father lived to be 24. Mother lived to be 45. There is a  family history for cardiovascular disease and hypertension.   SOCIAL HISTORY:  The patient has two daughters and a son, and a large family  that is very supportive. She is a full code.   PHYSICAL EXAMINATION:  VITAL SIGNS: On admission temperature 97.9, blood  pressure 196/94, pulse 96, respirations 20.  GENERAL APPEARANCE: This is a slender black female in no acute distress.  HEENT: Normocephalic and atraumatic. Conjunctiva and sclera clear.  Oropharynx without lesions.  NECK: Supple without thyromegaly. No thyroid  tenderness noted.  CHEST: Clear to auscultation and percussion with no rales, rhonchi, or  wheezes.  BREASTS: Deferred.  CARDIOVASCULAR: 2+ radial pulse. She had a quiet precordium with a regular  rate and rhythm without murmurs, rubs, or gallops.  ABDOMEN: The patient has positive bowel sounds, although hypoactive. She had  exquisite tenderness to palpation in the right lower quadrant. She was  diffusely tender otherwise.  She did have some tenderness to percussion over  the right flank.  PELVIC/ RECTAL EXAM: Deferred.  EXTREMITIES: Without clubbing, cyanosis, or edema.  NEUROLOGIC: Nonfocal.   Office laboratory from August 25, 2004, at 15:00 hours reveal a white count  of 10,000, hemoglobin 10.7 gm, hematocrit 33.8%, platelet count 211,000.  Chemistries with a sodium of 135, potassium 4, chloride  97, CO2 27, BUN 29,  creatinine 1.6, glucose 126, and LFTs normal. UA was negative except for a  few white blood cells, no blood was noted.   ASSESSMENT/PLAN:  1.  General surgery. The patient now has acute appendicitis with clinical      symptoms that are consistent despite the absence of fever and      leukocytosis possibly secondary to her age and Neo-Synephrine. The      patient does not have any increased risk for surgery. Plan is  for the      patient will be started on Zosyn 3.375 mg IV q.6h., first dose ordered      STAT. The patient will have a surgical consult and I have spoken with      Dr. Ovidio Kin who will see her this evening.  2.  Cardiovascular. Patient with a history of nonischemic cardiomyopathy.      She had an adenosine Cardiolite study performed on November 05, 2003,      which showed no diagnostic electrocardiographic changes. Scintigraphic      images showed mild apical thinning, but there was no ischemia. Gated      ejection fraction was 50% with normal wall motion. Plan is for the      patient to be continued on Maxzide when taking p.o.  3.  Hypothyroid disease. The patient has been stable. She will be continued      on her home dose of Synthroid when taking p.o.  4.  Patient is awake, alert, and oriented, knows the situation. She is      willing to proceed with treatment as needed. She is a full code.       MEN/MEDQ  D:  08/25/2004  T:  08/25/2004  Job:  [id number]

## 2011-03-06 ENCOUNTER — Other Ambulatory Visit: Payer: Self-pay | Admitting: Internal Medicine

## 2011-03-13 ENCOUNTER — Other Ambulatory Visit: Payer: Self-pay | Admitting: Internal Medicine

## 2011-04-20 ENCOUNTER — Other Ambulatory Visit: Payer: Self-pay | Admitting: Internal Medicine

## 2011-05-10 ENCOUNTER — Other Ambulatory Visit: Payer: Self-pay | Admitting: Internal Medicine

## 2011-05-20 ENCOUNTER — Other Ambulatory Visit: Payer: Self-pay | Admitting: Internal Medicine

## 2011-05-31 ENCOUNTER — Encounter: Payer: Self-pay | Admitting: Internal Medicine

## 2011-06-01 ENCOUNTER — Ambulatory Visit (INDEPENDENT_AMBULATORY_CARE_PROVIDER_SITE_OTHER): Payer: Medicare HMO | Admitting: Internal Medicine

## 2011-06-01 ENCOUNTER — Encounter: Payer: Self-pay | Admitting: Internal Medicine

## 2011-06-01 DIAGNOSIS — K219 Gastro-esophageal reflux disease without esophagitis: Secondary | ICD-10-CM

## 2011-06-01 DIAGNOSIS — H9190 Unspecified hearing loss, unspecified ear: Secondary | ICD-10-CM

## 2011-06-01 DIAGNOSIS — J309 Allergic rhinitis, unspecified: Secondary | ICD-10-CM

## 2011-06-01 MED ORDER — PANTOPRAZOLE SODIUM 40 MG PO TBEC: 40.0000 mg | DELAYED_RELEASE_TABLET | Freq: Every day | ORAL | Status: DC

## 2011-06-01 NOTE — Progress Notes (Signed)
  Subjective:    Patient ID: [name], female    DOB: [birth year], 77 y.o.   MRN: [id number]  HPI [name] reports that she is having pyrosis, nausea, heart burn and will occasionally have dysphagia. Generally able to swallow.   She c/o allergic rhinitis type symptoms and congestion in the evening, coinciding to when she opens the house up.   She is hard of hearing.   I have reviewed the patient's medical history in detail and updated the computerized patient record.    Review of Systems System review is negative for any constitutional, cardiac, pulmonary, GI or neuro symptoms or complaints     Objective:   Physical Exam Vitals noted Gen'l - very elderly , thin AA woman in no acute distress HEENT - cerumen but no impaction. TMs normal Cor - RRR Abdomen- BS +, mild tenderness to palpation.        Assessment & Plan:

## 2011-06-01 NOTE — Patient Instructions (Signed)
Heart burn, nausea and occasion problem with food going down = uncontrolled acid reflux. Plan - restart the protonix every AM and take the Zantac after supper. Call if you have more trouble with food getting stuck.  Hearing - may benefit from hearing test and possibly hearing aids. Plan - referral to audiology. My staff will call with an appointment.

## 2011-06-04 DIAGNOSIS — J309 Allergic rhinitis, unspecified: Secondary | ICD-10-CM | POA: Insufficient documentation

## 2011-06-04 DIAGNOSIS — K219 Gastro-esophageal reflux disease without esophagitis: Secondary | ICD-10-CM | POA: Insufficient documentation

## 2011-06-04 NOTE — Assessment & Plan Note (Signed)
Patient with recurrent symptoms of reflux since stopping PPI therapy for H2 therapy.   Plan - restart protonix 40 mg qAM           Continue ranitadine qPM

## 2011-06-04 NOTE — Assessment & Plan Note (Signed)
Rhinorrhea and congestion.  Plan - air out the house           Use of otc non-sedating antihistamine.

## 2011-06-12 LAB — DIFFERENTIAL
Basophils Relative: 1
Eosinophils Absolute: 0.1
Monocytes Absolute: 0.4
Monocytes Relative: 8

## 2011-06-12 LAB — CBC
Hemoglobin: 11 — ABNORMAL LOW
MCHC: 33
MCV: 86.3
RBC: 3.87

## 2011-06-12 LAB — D-DIMER, QUANTITATIVE: D-Dimer, Quant: 1.05 — ABNORMAL HIGH

## 2011-06-12 LAB — BASIC METABOLIC PANEL
CO2: 28
Calcium: 9.2
Chloride: 105
Chloride: 107
GFR calc Af Amer: 55 — ABNORMAL LOW
GFR calc Af Amer: >60
Potassium: 3.7
Sodium: 138
Sodium: 141

## 2011-06-12 LAB — POCT I-STAT, CHEM 8
Calcium, Ion: 1.18
Chloride: 105
Glucose, Bld: 117 — ABNORMAL HIGH
HCT: 36

## 2011-06-12 LAB — COMPREHENSIVE METABOLIC PANEL
CO2: 24
Calcium: 9.3
Creatinine, Ser: 1.23 — ABNORMAL HIGH
GFR calc Af Amer: 50 — ABNORMAL LOW
GFR calc non Af Amer: 41 — ABNORMAL LOW
Glucose, Bld: 99

## 2011-06-12 LAB — POCT CARDIAC MARKERS
Myoglobin, poc: 98.9
Operator id: 272551
Troponin i, poc: <0.05

## 2011-06-12 LAB — TSH: TSH: 0.125 — ABNORMAL LOW

## 2011-06-12 LAB — CARDIAC PANEL(CRET KIN+CKTOT+MB+TROPI)
CK, MB: 1.5
Troponin I: 0.04

## 2011-06-12 LAB — CK TOTAL AND CKMB (NOT AT ARMC): Relative Index: INVALID

## 2011-06-20 ENCOUNTER — Encounter: Payer: Self-pay | Admitting: Internal Medicine

## 2011-06-20 ENCOUNTER — Emergency Department (HOSPITAL_COMMUNITY)
Admission: EM | Admit: 2011-06-20 | Discharge: 2011-06-20 | Disposition: A | Discharge status: Home / Self Care | Payer: Medicare HMO | Attending: Emergency Medicine | Admitting: Emergency Medicine

## 2011-06-20 DIAGNOSIS — R5381 Other malaise: Secondary | ICD-10-CM | POA: Insufficient documentation

## 2011-06-20 DIAGNOSIS — R55 Syncope and collapse: Secondary | ICD-10-CM | POA: Insufficient documentation

## 2011-06-20 DIAGNOSIS — R51 Headache: Secondary | ICD-10-CM | POA: Insufficient documentation

## 2011-06-20 DIAGNOSIS — I10 Essential (primary) hypertension: Secondary | ICD-10-CM | POA: Insufficient documentation

## 2011-06-20 DIAGNOSIS — H9209 Otalgia, unspecified ear: Secondary | ICD-10-CM | POA: Insufficient documentation

## 2011-06-20 DIAGNOSIS — R0982 Postnasal drip: Secondary | ICD-10-CM | POA: Insufficient documentation

## 2011-06-20 DIAGNOSIS — J329 Chronic sinusitis, unspecified: Secondary | ICD-10-CM | POA: Insufficient documentation

## 2011-06-20 DIAGNOSIS — I252 Old myocardial infarction: Secondary | ICD-10-CM | POA: Insufficient documentation

## 2011-06-20 DIAGNOSIS — E039 Hypothyroidism, unspecified: Secondary | ICD-10-CM | POA: Insufficient documentation

## 2011-06-20 DIAGNOSIS — R509 Fever, unspecified: Secondary | ICD-10-CM | POA: Insufficient documentation

## 2011-06-20 DIAGNOSIS — J3489 Other specified disorders of nose and nasal sinuses: Secondary | ICD-10-CM | POA: Insufficient documentation

## 2011-06-20 LAB — POCT I-STAT, CHEM 8
Chloride: 106 mEq/L (ref 96–112)
Glucose, Bld: 98 mg/dL (ref 70–99)
HCT: 32 % — ABNORMAL LOW (ref 36.0–46.0)
Potassium: 4.1 mEq/L (ref 3.5–5.1)

## 2011-06-27 ENCOUNTER — Ambulatory Visit (INDEPENDENT_AMBULATORY_CARE_PROVIDER_SITE_OTHER): Payer: Medicare HMO | Admitting: Internal Medicine

## 2011-06-27 DIAGNOSIS — N289 Disorder of kidney and ureter, unspecified: Secondary | ICD-10-CM

## 2011-06-27 DIAGNOSIS — J069 Acute upper respiratory infection, unspecified: Secondary | ICD-10-CM

## 2011-06-27 DIAGNOSIS — I10 Essential (primary) hypertension: Secondary | ICD-10-CM

## 2011-06-27 DIAGNOSIS — K219 Gastro-esophageal reflux disease without esophagitis: Secondary | ICD-10-CM

## 2011-06-28 DIAGNOSIS — N289 Disorder of kidney and ureter, unspecified: Secondary | ICD-10-CM | POA: Insufficient documentation

## 2011-06-28 NOTE — Assessment & Plan Note (Signed)
Patient had hospital adm with acute blood loss thought to be upper GI in origin and was d/c's home on PPI + H2 blocker. She is doing well  Plan - at this time she is advised to continue PPI therapy           She may take H2 blocker prn for breakthrough symptoms.

## 2011-06-28 NOTE — Assessment & Plan Note (Signed)
BP Readings from Last 3 Encounters:  06/27/11 138/72  06/01/11 160/70  08/03/10 168/82   BP is ok today. Continue present medications.

## 2011-06-28 NOTE — Progress Notes (Signed)
  Subjective:    Patient ID: [name], female    DOB: 17-Nov-1916, 77 y.o.   MRN: [id number]  HPI [name] present for ED follow-up. She had an episode at home of near syncope and weakness. EMS was called and she was transported to ED - records reviewed. She was diagnosed with a sinus infection and started on amoxicllin and flonase nasal spray. Her neurologic exam was normal. Lab revealed a creatinine of 1.3 and she was told to follow-up with PCP for renal insufficiency. She has been doing well since this evaluation: tolerating her medications and has not had any recurrent symptoms.   I have reviewed the patient's medical history in detail and updated the computerized patient record.    Review of Systems System review is negative for any constitutional, cardiac, pulmonary, GI or neuro symptoms or complaints other than as described in the HPI.     Objective:   Physical Exam Vitals noted Gen'l - very thin elderly AA woman in no distress HEENT - no tenderness to percussion over frontal sinus Pulm- CTAP, normal respirations Cor - RRR        Assessment & Plan:  URI - resolving with treatment.  Plan - she is to complete antibiotics and continue nasal spray until sinus congestion/drainage is improved.

## 2011-06-28 NOTE — Assessment & Plan Note (Signed)
At Ed visit patient's creatinine was 1.3. Reviewed record going back several years: she has had mild progressive increase in creatinine from 0.9 to a high of 1.4 with an acute hospitalization and back to 1.14 range. This mild elevation of creatinine in the ED is not worrisome even though it understates actual renal function in a nanogenarian.  Plan - patient encouraged to hydrate

## 2011-07-02 ENCOUNTER — Emergency Department (HOSPITAL_COMMUNITY): Payer: Medicare HMO

## 2011-07-02 ENCOUNTER — Inpatient Hospital Stay (HOSPITAL_COMMUNITY)
Admission: EM | Admit: 2011-07-02 | Discharge: 2011-07-12 | DRG: 378 | Disposition: A | Discharge status: Hospice | Payer: Medicare HMO | Attending: Internal Medicine | Admitting: Internal Medicine

## 2011-07-02 DIAGNOSIS — D6489 Other specified anemias: Secondary | ICD-10-CM

## 2011-07-02 DIAGNOSIS — Z79899 Other long term (current) drug therapy: Secondary | ICD-10-CM

## 2011-07-02 DIAGNOSIS — Z7982 Long term (current) use of aspirin: Secondary | ICD-10-CM

## 2011-07-02 DIAGNOSIS — K5731 Diverticulosis of large intestine without perforation or abscess with bleeding: Principal | ICD-10-CM | POA: Diagnosis present

## 2011-07-02 DIAGNOSIS — R55 Syncope and collapse: Secondary | ICD-10-CM | POA: Diagnosis present

## 2011-07-02 DIAGNOSIS — I428 Other cardiomyopathies: Secondary | ICD-10-CM | POA: Diagnosis present

## 2011-07-02 DIAGNOSIS — I959 Hypotension, unspecified: Secondary | ICD-10-CM | POA: Diagnosis present

## 2011-07-02 DIAGNOSIS — K922 Gastrointestinal hemorrhage, unspecified: Secondary | ICD-10-CM

## 2011-07-02 DIAGNOSIS — K219 Gastro-esophageal reflux disease without esophagitis: Secondary | ICD-10-CM | POA: Diagnosis present

## 2011-07-02 DIAGNOSIS — I059 Rheumatic mitral valve disease, unspecified: Secondary | ICD-10-CM | POA: Diagnosis present

## 2011-07-02 DIAGNOSIS — Z66 Do not resuscitate: Secondary | ICD-10-CM | POA: Diagnosis not present

## 2011-07-02 DIAGNOSIS — D62 Acute posthemorrhagic anemia: Secondary | ICD-10-CM | POA: Diagnosis present

## 2011-07-02 DIAGNOSIS — M1990 Unspecified osteoarthritis, unspecified site: Secondary | ICD-10-CM | POA: Diagnosis present

## 2011-07-02 DIAGNOSIS — I10 Essential (primary) hypertension: Secondary | ICD-10-CM | POA: Diagnosis present

## 2011-07-02 DIAGNOSIS — I252 Old myocardial infarction: Secondary | ICD-10-CM

## 2011-07-02 DIAGNOSIS — F329 Major depressive disorder, single episode, unspecified: Secondary | ICD-10-CM | POA: Diagnosis present

## 2011-07-02 DIAGNOSIS — H409 Unspecified glaucoma: Secondary | ICD-10-CM | POA: Diagnosis present

## 2011-07-02 DIAGNOSIS — E039 Hypothyroidism, unspecified: Secondary | ICD-10-CM | POA: Diagnosis present

## 2011-07-02 DIAGNOSIS — Z515 Encounter for palliative care: Secondary | ICD-10-CM

## 2011-07-02 DIAGNOSIS — F3289 Other specified depressive episodes: Secondary | ICD-10-CM | POA: Diagnosis present

## 2011-07-02 LAB — COMPREHENSIVE METABOLIC PANEL
ALT: 14 U/L (ref 0–35)
AST: 24 U/L (ref 0–37)
Alkaline Phosphatase: 52 U/L (ref 39–117)
CO2: 30 mEq/L (ref 19–32)
Chloride: 106 mEq/L (ref 96–112)
GFR calc non Af Amer: 43 mL/min — ABNORMAL LOW (ref >90)
Potassium: 4.5 mEq/L (ref 3.5–5.1)
Sodium: 140 mEq/L (ref 135–145)
Total Bilirubin: 0.3 mg/dL (ref 0.3–1.2)

## 2011-07-02 LAB — URINALYSIS, ROUTINE W REFLEX MICROSCOPIC
Bilirubin Urine: NEGATIVE
Ketones, ur: NEGATIVE mg/dL
Leukocytes, UA: NEGATIVE
Nitrite: NEGATIVE
Protein, ur: NEGATIVE mg/dL
Urobilinogen, UA: 0.2 mg/dL (ref 0.0–1.0)
pH: 6.5 (ref 5.0–8.0)

## 2011-07-02 LAB — DIFFERENTIAL
Basophils Absolute: 0 10*3/uL (ref 0.0–0.1)
Basophils Relative: 0 % (ref 0–1)
Monocytes Relative: 5 % (ref 3–12)
Neutro Abs: 5.7 10*3/uL (ref 1.7–7.7)
Neutrophils Relative %: 80 % — ABNORMAL HIGH (ref 43–77)

## 2011-07-02 LAB — CBC
Hemoglobin: 7.5 g/dL — ABNORMAL LOW (ref 12.0–15.0)
MCH: 28 pg (ref 26.0–34.0)
RBC: 2.68 MIL/uL — ABNORMAL LOW (ref 3.87–5.11)
WBC: 7 10*3/uL (ref 4.0–10.5)

## 2011-07-02 LAB — TROPONIN I: Troponin I: <0.3 ng/mL (ref <0.30)

## 2011-07-02 LAB — PROTIME-INR: INR: 1.13 (ref 0.00–1.49)

## 2011-07-03 ENCOUNTER — Other Ambulatory Visit (HOSPITAL_COMMUNITY): Payer: Medicare HMO

## 2011-07-03 DIAGNOSIS — I959 Hypotension, unspecified: Secondary | ICD-10-CM

## 2011-07-03 DIAGNOSIS — K922 Gastrointestinal hemorrhage, unspecified: Secondary | ICD-10-CM

## 2011-07-03 DIAGNOSIS — D6489 Other specified anemias: Secondary | ICD-10-CM

## 2011-07-03 DIAGNOSIS — D649 Anemia, unspecified: Secondary | ICD-10-CM

## 2011-07-03 LAB — CBC
HCT: 29.2 % — ABNORMAL LOW (ref 36.0–46.0)
Hemoglobin: 10 g/dL — ABNORMAL LOW (ref 12.0–15.0)
Hemoglobin: 7.9 g/dL — ABNORMAL LOW (ref 12.0–15.0)
MCH: 30.5 pg (ref 26.0–34.0)
MCHC: 34.2 g/dL (ref 30.0–36.0)
MCHC: 35 g/dL (ref 30.0–36.0)
RBC: 2.63 MIL/uL — ABNORMAL LOW (ref 3.87–5.11)
RDW: 14.5 % (ref 11.5–15.5)
WBC: 5.4 10*3/uL (ref 4.0–10.5)

## 2011-07-03 LAB — BASIC METABOLIC PANEL
BUN: 35 mg/dL — ABNORMAL HIGH (ref 6–23)
CO2: 26 mEq/L (ref 19–32)
Chloride: 108 mEq/L (ref 96–112)
GFR calc non Af Amer: 52 mL/min — ABNORMAL LOW (ref >90)
Glucose, Bld: 107 mg/dL — ABNORMAL HIGH (ref 70–99)
Potassium: 4.4 mEq/L (ref 3.5–5.1)

## 2011-07-03 LAB — CARDIAC PANEL(CRET KIN+CKTOT+MB+TROPI)
CK, MB: 3.1 ng/mL (ref 0.3–4.0)
Relative Index: 3 — ABNORMAL HIGH (ref 0.0–2.5)
Relative Index: INVALID (ref 0.0–2.5)
Total CK: 95 U/L (ref 7–177)
Troponin I: <0.3 ng/mL (ref <0.30)
Troponin I: <0.3 ng/mL (ref <0.30)

## 2011-07-03 LAB — URINE CULTURE

## 2011-07-03 LAB — PREPARE RBC (CROSSMATCH)

## 2011-07-04 DIAGNOSIS — D6489 Other specified anemias: Secondary | ICD-10-CM

## 2011-07-04 DIAGNOSIS — K922 Gastrointestinal hemorrhage, unspecified: Secondary | ICD-10-CM

## 2011-07-04 DIAGNOSIS — R55 Syncope and collapse: Secondary | ICD-10-CM

## 2011-07-04 LAB — CBC
HCT: 23.7 % — ABNORMAL LOW (ref 36.0–46.0)
Hemoglobin: 8.5 g/dL — ABNORMAL LOW (ref 12.0–15.0)
MCH: 30.8 pg (ref 26.0–34.0)
MCHC: 35.9 g/dL (ref 30.0–36.0)
RBC: 2.76 MIL/uL — ABNORMAL LOW (ref 3.87–5.11)

## 2011-07-04 LAB — HEMOGLOBIN AND HEMATOCRIT, BLOOD: HCT: 21.6 % — ABNORMAL LOW (ref 36.0–46.0)

## 2011-07-05 ENCOUNTER — Other Ambulatory Visit (HOSPITAL_COMMUNITY): Payer: Medicare HMO

## 2011-07-05 DIAGNOSIS — I959 Hypotension, unspecified: Secondary | ICD-10-CM

## 2011-07-05 LAB — BASIC METABOLIC PANEL
BUN: 23 mg/dL (ref 6–23)
Chloride: 107 mEq/L (ref 96–112)
Creatinine, Ser: 1.16 mg/dL — ABNORMAL HIGH (ref 0.50–1.10)
GFR calc Af Amer: 46 mL/min — ABNORMAL LOW (ref >90)
GFR calc non Af Amer: 40 mL/min — ABNORMAL LOW (ref >90)

## 2011-07-05 LAB — HEMOGLOBIN AND HEMATOCRIT, BLOOD
HCT: 29.8 % — ABNORMAL LOW (ref 36.0–46.0)
HCT: 30.7 % — ABNORMAL LOW (ref 36.0–46.0)
Hemoglobin: 10.8 g/dL — ABNORMAL LOW (ref 12.0–15.0)

## 2011-07-05 LAB — TYPE AND SCREEN
Unit division: 0
Unit division: 0
Unit division: 0

## 2011-07-06 DIAGNOSIS — D6489 Other specified anemias: Secondary | ICD-10-CM

## 2011-07-06 DIAGNOSIS — K922 Gastrointestinal hemorrhage, unspecified: Secondary | ICD-10-CM

## 2011-07-06 LAB — HEMOGLOBIN AND HEMATOCRIT, BLOOD
HCT: 26.3 % — ABNORMAL LOW (ref 36.0–46.0)
Hemoglobin: 9.3 g/dL — ABNORMAL LOW (ref 12.0–15.0)
Hemoglobin: 9.8 g/dL — ABNORMAL LOW (ref 12.0–15.0)

## 2011-07-07 DIAGNOSIS — I959 Hypotension, unspecified: Secondary | ICD-10-CM

## 2011-07-07 DIAGNOSIS — K922 Gastrointestinal hemorrhage, unspecified: Secondary | ICD-10-CM

## 2011-07-07 DIAGNOSIS — D6489 Other specified anemias: Secondary | ICD-10-CM

## 2011-07-07 DIAGNOSIS — R55 Syncope and collapse: Secondary | ICD-10-CM

## 2011-07-07 LAB — BASIC METABOLIC PANEL
BUN: 9 mg/dL (ref 6–23)
CO2: 28 mEq/L (ref 19–32)
Chloride: 110 mEq/L (ref 96–112)
GFR calc non Af Amer: 50 mL/min — ABNORMAL LOW (ref >90)
Glucose, Bld: 117 mg/dL — ABNORMAL HIGH (ref 70–99)
Potassium: 3.6 mEq/L (ref 3.5–5.1)
Sodium: 141 mEq/L (ref 135–145)

## 2011-07-08 DIAGNOSIS — D6489 Other specified anemias: Secondary | ICD-10-CM

## 2011-07-08 DIAGNOSIS — K922 Gastrointestinal hemorrhage, unspecified: Secondary | ICD-10-CM

## 2011-07-08 LAB — CBC
HCT: 20.4 % — ABNORMAL LOW (ref 36.0–46.0)
Hemoglobin: 6.8 g/dL — CL (ref 12.0–15.0)
MCH: 30.4 pg (ref 26.0–34.0)
MCHC: 33.3 g/dL (ref 30.0–36.0)
MCV: 91.1 fL (ref 78.0–100.0)
RBC: 2.24 MIL/uL — ABNORMAL LOW (ref 3.87–5.11)

## 2011-07-09 LAB — CROSSMATCH
ABO/RH(D): O POS
Unit division: 0
Unit division: 0

## 2011-07-09 LAB — HEMOGLOBIN AND HEMATOCRIT, BLOOD
HCT: 31.1 % — ABNORMAL LOW (ref 36.0–46.0)
Hemoglobin: 10.7 g/dL — ABNORMAL LOW (ref 12.0–15.0)

## 2011-07-10 LAB — HEMOGLOBIN AND HEMATOCRIT, BLOOD
HCT: 24.6 % — ABNORMAL LOW (ref 36.0–46.0)
Hemoglobin: 8.3 g/dL — ABNORMAL LOW (ref 12.0–15.0)
Hemoglobin: 8.3 g/dL — ABNORMAL LOW (ref 12.0–15.0)

## 2011-07-10 LAB — CULTURE, BLOOD (ROUTINE X 2)
Culture  Setup Time: 201210160219
Culture: NO GROWTH
Culture: NO GROWTH

## 2011-07-12 DIAGNOSIS — K922 Gastrointestinal hemorrhage, unspecified: Secondary | ICD-10-CM

## 2011-07-15 DIAGNOSIS — K922 Gastrointestinal hemorrhage, unspecified: Secondary | ICD-10-CM

## 2011-07-18 ENCOUNTER — Encounter: Payer: Self-pay | Admitting: Internal Medicine

## 2011-07-20 ENCOUNTER — Telehealth: Payer: Self-pay | Admitting: *Deleted

## 2011-07-20 NOTE — Discharge Summary (Signed)
[name], [name]              ACCOUNT NO.:  [id number]  MEDICAL RECORD NO.:  [id number]  LOCATION:  1301                         FACILITY:  [facility]  PHYSICIAN:  Rosalyn Gess. Kjuan Seipp, MD  DATE OF BIRTH:  [birth year]  DATE OF ADMISSION:  07/02/2011 DATE OF DISCHARGE:  07/12/2011                              DISCHARGE SUMMARY   ADMITTING DIAGNOSES: 1. Hematochezia. 2. Hematochezia related anemia. 3. Question of syncope. 4. Hypothyroid disease. 5. Hypertension.  DISCHARGE DIAGNOSES: 1. Lower gastrointestinal bleed, presumed diverticular. 2. Hematochezia related anemia. 3. Question of syncope. 4. Hypothyroid disease. 5. Hypertension.  CONSULTANTS:  Dr. Hedwig Morton. Juanda Chance and colleagues for Gastroenterology.  PROCEDURES:  Acute abdominal series performed on day of admission which revealed emphysema without acute cardiopulmonary findings.  No evidence for intraperitoneal free air or over bowel obstruction.  HISTORY OF PRESENT ILLNESS:  [name] is a delightful 77 year old woman with a past medical history significant for mitral valve regurgitation, hypothyroid disease, hypertension, hyperlipidemia, who presented to [facility] Emergency Department with a complaint of bright red blood per rectum.  According to the patient's daughter when the patient woke up on the morning of admission, she was feeling like she wants to throw up at approximately 6:00 am, and did not feel well.  When she woke up and ate breakfast with her daughter, she began to feel "jittery" and when she stood up, she passed out.  The patient's daughter reported that there was 1 episode of emesis that was clear.  The second emesis that was brownish red in color.  There were no coffee grounds reported.  The patient was brought by EMS to the emergency department, and when she used the restroom, she passed bright red blood per rectum on several occasions.  Of note, the patient had no prior GI history.  She had had  a colonoscopy several years ago which was unrevealing except for diverticulosis.  The patient had no fevers, chills, sweats, dizziness, chest pain, palpitations, shortness of breath, orthopnea, or abdominal pain.  PAST MEDICAL HISTORY:  Hypertension, hypothyroid disease, mitral regurgitation, hyperlipidemia, osteoarthritis, GERD, depression, cardiomyopathy and glaucoma.  PAST SURGICAL HISTORY:  No surgeries.  SOCIAL HISTORY:  The patient is the matriarch of a very large family. She has 11 children in total with many grandchildren and great- grandchildren.  Family is extremely supportive and makes group decisions.  FAMILY HISTORY:  Noncontributory, but the patient's mother did have hypertension and coronary artery disease, and she had 4 brothers who where diagnosed with various cancers.  Please see the H and P for admission examination.  Initial hemoglobin was 7.5 g with a hematocrit 23.4, platelets 149,000.  Chemistries were normal.  HOSPITAL COURSE: 1. GI.  The patient presenting with a lower GI bleed, presumed to be     diverticular.  She was admitted to a step-down unit/ICU.  The     patient had continued bleeding intermittently.  She was transfused     a total of 8 units of blood during this admission.  She was seen in     consultation by the GI service, who felt that the most likely cause     of her bleeding was diverticular.  The patient was seen in     consultation by Interventional Radiology on October 17, but the     patient's bleeding had stopped.  They felt the risk of doing an     angiogram was too high and the procedure was canceled.  The patient     was initially treated with Zosyn for possible lower GI infection,     but this was eventually stopped with no sign of infection being     noted.  Several family conferences were held in regards the     patient's plans and goals of care.  The patient herself participate     to a limited degree, which she did defer to her  family.  The     decision was made that she would be a DNR/DNI when she appeared     most critical on the 15th.  Second family conference was held.  On     the 22nd when the patient was seem to be having continued GI bleed     and had received a total of 8 units of blood.  Careful conversation     including the patient revealed that she did not want any heroic     measures.  She specifically did not want to have any surgery.     Furthermore, she expressed that she was fatigued and tired of     diagnostic procedures.  At this point, the patient did seem     excepting of her current status and desired comfort care only.     Family was in concurrence with this.  The decision was then made     that she would be no longer transfused, not check further labs, and     no other interventions were planned.  It was felt the patient would     be an excellent candidate for [facility] where she could have     excellent supportive care as well as having an opportunity for     family to visit with her ad lib.  With this in mind, the patient is     now ready for transfer to [facility] on the 24th. The patient's other medical problems including hypertension and hypothyroid disease remained stable.  PHYSICAL EXAMINATION:  On the day of discharge, VITAL SIGNS:  Reveal temperature 97.8, blood pressure 147/70, pulse was 67, respirations 16, oxygen saturation was 94% on room air. GENERAL APPEARANCE:  This is a very elderly African American woman who is in no distress.  She is awake, alert, and calm. HEENT:  Significant for mild temporal wasting.  Conjunctivae and sclerae were clear. NECK:  Supple. CHEST:  The patient has no deformity. LUNGS:  The patient is moving air well.  There is no increased work of breathing.  No use of accessory musculature.  No rales, wheezes, or rhonchi were appreciated. CARDIOVASCULAR:  2+ radial pulse.  Her precordium was quiet. HEART:  Sounds were distant, but  regular. ABDOMEN:  The patient had positive bowel sounds in all 4 quadrants.  She had no guarding or rebound.  Her abdomen was soft.  There was no tenderness to palpation. GENITALIA/RECTAL:  Deferred. EXTREMITIES:  The patient has mild arthritic changes.  She has mild edema in her hands, but no other deformities or problems are noted. NEUROLOGIC:  The patient is awake, alert.  She is oriented to person, place, context, and examiner.  Cranial nerves 2-12 are grossly intact with normal facial symmetry and muscle movement.  The patient is able to move all extremities to command.  No further examination conducted. DERM:  The patient has no skin breakdown on examination.  LABORATORY:  On October 22, at 4 p.m., hemoglobin was 8.3 g.  Blood cultures from the 15th were no growth.  Final potassium on October 18 was 2.5.  The patient was given oral replacement.  Final metabolic panel from the 19th revealed a sodium of 141, potassium 3.6, chloride of 110, CO2 of 28, BUN of 9, creatinine 0.95, calcium 7.6.  The patient did have cardiac enzymes cycled that were negative.  Urinalysis was checked and was negative.  DISPOSITION:  The patient to be transferred to [facility].  We will minimize her medications.  Please see the medication discharge manager. Goals of care are comfort only.  The patient prognosis is very guarded, given her advanced age, intermittent ongoing GI bleeding.     Rosalyn Gess Jewelz Kobus, MD     MEN/MEDQ  D:  07/12/2011  T:  07/12/2011  Job:  [id number]  Electronically Signed by Illene Regulus MD on 07/20/2011 06:00:39 PM

## 2011-07-20 NOTE — Telephone Encounter (Signed)
Called back, spoke with Nurse [place] Passey. Plan- ativan 0.5 mg po q6 prn anxiety

## 2011-07-20 NOTE — Telephone Encounter (Signed)
Received call from Alger Memos, RN at [facility]. She states pt is complaining of shortness of breath. Nurse states pt does not appear visibly sob and has no other complaint except neck and leg pains.  Respirations are 14 and shallow.  Ox sat 100%.  Pt has history of blood transfusion prior to admission to [facility]. Nurse thinks pt may be having some anxiety. Please advise.

## 2011-07-21 NOTE — H&P (Signed)
NAMEANDREEA, [name]              ACCOUNT NO.:  [id number]  MEDICAL RECORD NO.:  [id number]  LOCATION:  WLED                         FACILITY:  [facility]  PHYSICIAN:  Lionel December, MD  DATE OF BIRTH:  [birth year]  DATE OF ADMISSION:  07/02/2011 DATE OF DISCHARGE:                             HISTORY & PHYSICAL   ER REQUESTING PHYSICIAN:  Markham Jordan L. Effie Shy, M.D.  CHIEF COMPLAINT:  Bloody stools.  HISTORY OF PRESENT ILLNESS:  This is a 77 year old woman with a past medical history, significant for mitral regurgitation, hypothyroidism, hypertension, hyperlipidemia, who presents to the [facility] Emergency Department with a complaint of bright red blood per rectum.  Per the patient's daughter, when the patient woke up this morning, she stated she was feeling like she wanted to throw up and approximately 6 a.m., she did not feel well, but was able to drift back to sleep at that point.  When she woke up, eat breakfast with her daughter, which consisted of oatmeal and started feeling jittery and when she went to stand up she passed out.  As she was passing out, she was holding onto her stomach.  The daughter then noted that she threw up once, at first it was clear and then the next time it was brownish red in color.  There were no coffee-grounds reported.  The patient came to the emergency department by EMS and when she got to the emergency department, she had to use the rest room and passed nothing, but bright red blood per rectum.  This happens several times.  The patient has never had symptoms like this in the past and had a colonoscopy several years ago, which was essentially unrevealing.  The patient denies any fevers, chills, sweats, dizziness, chest pain, palpitations, shortness of breath, orthopnea, recurrent abdominal pain.  PAST MEDICAL HISTORY: 1. Hypertension. 2. Hypothyroidism. 3. Mitral regurgitation. 4. Hyperlipidemia. 5. Osteoarthritis. 6. Gastroesophageal reflux  disease. 7. Depression. 8. Cardiomyopathy. 9. Glaucoma.  SOCIAL HISTORY:  The patient lives in [place] with her daughter, has 11 children in total, is currently widowed.  Denies any tobacco, alcohol, or illicit drug use.  FAMILY HISTORY:  Mother has hypertension and coronary artery disease and she had 4 brothers who were deceased from cancer.  REVIEW OF SYSTEMS:  Twelve-point review of systems is reviewed and negative with the exceptions in the HPI.  CURRENT MEDICATIONS: 1. Aspirin. 2. Bisacodyl. 3. Calcium. 4. Isosorbide dinitrate. 5. Isosorbide mononitrate. 6. Levothyroxine. 7. Lisinopril and hydrochlorothiazide. 8. Protonix. 9. Ranitidine. 10.Sertraline.  ALLERGIES:  No known drug allergies.  REVIEW OF SYSTEMS:  Twelve-point review of systems is reviewed and negative with the exceptions in the HPI.  PHYSICAL EXAMINATION:  VITAL SIGNS:  Temperature 99.1, pulse 69, respiratory rate 12, blood pressure 141/68, satting 100% on room air. GENERAL:  Awake, alert, and oriented x3, no acute distress. HEENT:  Pupils equal, round, and reactive to light.  Extraocular movements are intact.  Mucous membranes are moist.  Oropharynx is clear without erythema or exudate.  Mucous membranes are pale as are the patient's conjunctivae. NECK:  Supple.  No JVD.  No lymphadenopathy. CARDIAC:  Regular rate and rhythm.  There is a 2/6 diastolic murmur,  best heard over the left sternal border. RESPIRATORY:  Clear to auscultation bilaterally.  No wheezes, rhonchi, or rales. ABDOMEN:  Soft, nontender, nondistended.  Normoactive bowel sounds.  No hepatosplenomegaly.  No masses.  The patient does claim that there is a sensation of having the stool when pressing on the left lower quadrant. EXTREMITIES:  No clubbing, cyanosis, or edema. NEUROLOGIC:  Mental status intact.  Alert and oriented x3.  Cranial nerves 2 through 12 are intact.  No focal, sensory, or motor deficits are noted  throughout.  LABORATORY DATA:  White blood cell 7.0; hemoglobin 7.5, which is a significant drop from the patient's baseline; hematocrit 23.4; platelets 149.  Urinalysis is negative.  Troponin is undetectable.  Sodium 140, potassium 4.5, chloride 106, bicarb 30, BUN 32, creatinine 1.08, glucose 145.  RADIOLOGY:  Two-view of the abdomen shows no evidence for intraperitoneal free air or overt bowel obstruction.  EKG, unchanged from the patient's baseline shows sinus rhythm, no ST-or T-wave changes to suggest ischemia.  ASSESSMENT/PLAN: 1. Likely lower gastrointestinal bleed.  Given the patient's bright     red blood per rectum, I suspect that she has a lower     gastrointestinal bleed.  Most likely source would be the     diverticular disease or arteriovenous malformation given the rate     of the patient's bleeding.  Hemorrhoidal bleeding is also a     possibility, however, given the degree of bleeding in this patient,     needs for transfusion, at this point in time, I suspect this is not     the entire story.  The patient did have red to brown vomitus per     the patient's daughter, however, this was on the second vomiting     episode and I suspect this could be just secondary to     gastroesophageal irritation from the first episode of vomiting.  It     is very possible that the nausea and vomiting was secondary to the     syncopal episode the patient had.  At this point in time, GI has     been consulted.  The patient will remain n.p.o.  I suspect the     patient would benefit from both upper and lower endoscopy to     further evaluate for bleeding in this patient.  The patient will     need blood.  Please see anemia per below. 2. Blood-loss anemia, likely secondary to lower gastrointestinal bleed     per above.  We will transfuse the patient 2 units.  We will cycle     CBC q.6 hours.  GI has been consulted for above.  Continue to     monitor. 3. Syncope.  This is likely  secondary to blood-loss anemia per above.     Blood transfusion.  Continue to monitor on telemetry. 4. Hypothyroidism.  We will continue the patient on Synthroid. 5. Hypertension.  The patient is well controlled.  We will continue     home medications. 6. FEN/prophylaxis.  N.p.o. for now.  SCD for deep venous thrombosis     prophylaxis if the patient does have any GI bleed, also holding     patient's aspirin due to GI bleeding for now. 7. Code status:  The patient is assumed full code.          ______________________________ Lionel December, MD     MC/MEDQ  D:  07/02/2011  T:  07/02/2011  Job:  [id number]  Electronically Signed by Lionel December MD on 07/21/2011 07:28:57 PM

## 2011-07-24 DIAGNOSIS — K922 Gastrointestinal hemorrhage, unspecified: Secondary | ICD-10-CM

## 2011-07-24 DIAGNOSIS — R339 Retention of urine, unspecified: Secondary | ICD-10-CM

## 2011-07-31 ENCOUNTER — Telehealth: Payer: Self-pay | Admitting: *Deleted

## 2011-07-31 NOTE — Telephone Encounter (Signed)
Pt's daughter Bonita Quin calling in regards to paper work that needs to be signed by MD. I called pts daughter to inform her the hospice order had been signed. Waiting on call back from pts daughter linda

## 2011-08-01 NOTE — Telephone Encounter (Signed)
I called pts daughter and left a message on her voicemail all the pt orders and paperwork were faxed into hospice yesterday and originals sent down to scanning.

## 2011-08-12 ENCOUNTER — Other Ambulatory Visit: Payer: Self-pay | Admitting: Internal Medicine

## 2011-08-21 ENCOUNTER — Ambulatory Visit (INDEPENDENT_AMBULATORY_CARE_PROVIDER_SITE_OTHER): Payer: Medicare HMO | Admitting: Internal Medicine

## 2011-08-21 DIAGNOSIS — I209 Angina pectoris, unspecified: Secondary | ICD-10-CM

## 2011-08-21 DIAGNOSIS — I10 Essential (primary) hypertension: Secondary | ICD-10-CM

## 2011-08-21 DIAGNOSIS — D62 Acute posthemorrhagic anemia: Secondary | ICD-10-CM

## 2011-08-21 DIAGNOSIS — I428 Other cardiomyopathies: Secondary | ICD-10-CM

## 2011-08-23 DIAGNOSIS — D62 Acute posthemorrhagic anemia: Secondary | ICD-10-CM | POA: Insufficient documentation

## 2011-08-23 NOTE — Assessment & Plan Note (Signed)
Doing well. No evidence of decompensation.   Plan- continue present medications.

## 2011-08-23 NOTE — Assessment & Plan Note (Addendum)
No complaints of chest pain.  Plan - resume Imdur.

## 2011-08-23 NOTE — Assessment & Plan Note (Signed)
BP Readings from Last 3 Encounters:  08/21/11 138/80  06/27/11 138/72  06/01/11 160/70   Good control on present medications

## 2011-08-23 NOTE — Assessment & Plan Note (Signed)
Remains anemic with Hgb 8.3 - stable from 1 month ago. % Iron sat = 19%, a little low.  Plan - iron rich diet           F/u lab prn

## 2011-08-23 NOTE — Progress Notes (Signed)
  Subjective:    Patient ID: [name], female    DOB: 09-05-1917, 77 y.o.   MRN: [id number]  HPI [name] is a delightful woman recently hospitalized for a major LGI bleed requiring 8 units of PRBCs. She was greatly weakened and given her advanced age she was admitted to hospice and transferred from hospital to [facility]. She rallied and did very well graduating to SNF. She has just completed her rehab stay and has returned home. She is doing GREAT: taking a good diet, continuing to exercise and enjoying her very large extended family. She has no complaints today and is very thankful for her care and good fortune.  Past Medical History  Diagnosis Date  . Internal hemorrhoid   . Diverticulosis of colon (without mention of hemorrhage)   . Herpes zoster   . Glaucoma   . Lumbar back pain   . Hypertension   . Cardiomyopathy   . Hypothyroid    Past Surgical History  Procedure Date  . Thyroidectomy   . Appendectomy   . Tubal ligation    No family history on file. History   Social History  . Marital Status: Widowed    Spouse Name: N/A    Number of Children: N/A  . Years of Education: N/A   Occupational History  . Not on file.   Social History Main Topics  . Smoking status: Never Smoker   . Smokeless tobacco: Not on file  . Alcohol Use: Not on file  . Drug Use: Not on file  . Sexually Active: Not on file   Other Topics Concern  . Not on file   Social History Narrative   Lives alone but family stay with her     Review of Systems  Constitutional: Negative.   HENT: Negative.   Eyes: Negative.   Respiratory: Negative.   Cardiovascular: Negative.   Gastrointestinal: Negative.   Musculoskeletal: Positive for back pain.  Neurological: Negative.   Hematological: Negative.        Objective:   Physical Exam Vitals stable but she is underweight Gen'l- Thin, chipper elderly woman in no acute distress HEENT- mild temporal wasting, C&S clear Cor- 2+ radial  pulse, RRR Pulm - normal respirations Abd - soft, w/o tenderness Neuro - A&O x 3, speech and cognition clear. Gait - independent with the use of a cane/walker.       Assessment & Plan:

## 2011-08-24 ENCOUNTER — Other Ambulatory Visit: Payer: Self-pay | Admitting: Internal Medicine

## 2011-08-25 ENCOUNTER — Telehealth: Payer: Self-pay | Admitting: *Deleted

## 2011-08-25 DIAGNOSIS — K5731 Diverticulosis of large intestine without perforation or abscess with bleeding: Secondary | ICD-10-CM

## 2011-08-25 DIAGNOSIS — I10 Essential (primary) hypertension: Secondary | ICD-10-CM

## 2011-08-25 DIAGNOSIS — M6281 Muscle weakness (generalized): Secondary | ICD-10-CM

## 2011-08-25 DIAGNOSIS — I252 Old myocardial infarction: Secondary | ICD-10-CM

## 2011-08-25 NOTE — Telephone Encounter (Signed)
Pt daughter states MD was given FL2 form for [employer]  and [employer] (attn Mrs. Moore) [phone] Blue part of form should be mailed to office [address] [place] [address]

## 2011-09-01 NOTE — Telephone Encounter (Signed)
hav gone through all my paperwork and do not find these forms

## 2011-09-05 NOTE — Telephone Encounter (Signed)
Have you seen these forms? 

## 2011-09-28 ENCOUNTER — Telehealth: Payer: Self-pay | Admitting: *Deleted

## 2011-09-28 MED ORDER — LISINOPRIL-HYDROCHLOROTHIAZIDE 10-12.5 MG PO TABS: 1.0000 | ORAL_TABLET | Freq: Every day | ORAL | Status: DC

## 2011-09-28 NOTE — Telephone Encounter (Signed)
Refill request for Lisinopril-HCTZ 10-12.5mg .

## 2011-10-13 ENCOUNTER — Other Ambulatory Visit: Payer: Self-pay | Admitting: Internal Medicine

## 2011-10-13 NOTE — Telephone Encounter (Signed)
Zoloft request [last refill 09.01.12 #30x1]

## 2011-10-13 NOTE — Telephone Encounter (Signed)
Zoloft request [last filled 09.01.12 #30x1]

## 2011-11-12 ENCOUNTER — Other Ambulatory Visit: Payer: Self-pay | Admitting: Internal Medicine

## 2011-12-05 ENCOUNTER — Ambulatory Visit: Payer: Medicare HMO | Admitting: Internal Medicine

## 2011-12-07 ENCOUNTER — Ambulatory Visit: Payer: Medicare HMO | Admitting: Internal Medicine

## 2011-12-12 ENCOUNTER — Other Ambulatory Visit (INDEPENDENT_AMBULATORY_CARE_PROVIDER_SITE_OTHER): Payer: Medicare HMO

## 2011-12-12 ENCOUNTER — Encounter: Payer: Self-pay | Admitting: Internal Medicine

## 2011-12-12 ENCOUNTER — Ambulatory Visit (INDEPENDENT_AMBULATORY_CARE_PROVIDER_SITE_OTHER): Payer: Medicare HMO | Admitting: Internal Medicine

## 2011-12-12 VITALS — BP 152/78 | HR 88 | Temp 98.5°F | Resp 14 | Wt 87.0 lb

## 2011-12-12 DIAGNOSIS — I10 Essential (primary) hypertension: Secondary | ICD-10-CM

## 2011-12-12 DIAGNOSIS — I209 Angina pectoris, unspecified: Secondary | ICD-10-CM

## 2011-12-12 DIAGNOSIS — D62 Acute posthemorrhagic anemia: Secondary | ICD-10-CM

## 2011-12-12 DIAGNOSIS — K219 Gastro-esophageal reflux disease without esophagitis: Secondary | ICD-10-CM

## 2011-12-12 DIAGNOSIS — D518 Other vitamin B12 deficiency anemias: Secondary | ICD-10-CM

## 2011-12-12 LAB — COMPREHENSIVE METABOLIC PANEL
Alkaline Phosphatase: 86 U/L (ref 39–117)
BUN: 28 mg/dL — ABNORMAL HIGH (ref 6–23)
CO2: 28 mEq/L (ref 19–32)
GFR: 55 mL/min — ABNORMAL LOW (ref >60.00)
Glucose, Bld: 112 mg/dL — ABNORMAL HIGH (ref 70–99)
Total Bilirubin: 0.5 mg/dL (ref 0.3–1.2)
Total Protein: 7.6 g/dL (ref 6.0–8.3)

## 2011-12-12 LAB — CBC WITH DIFFERENTIAL/PLATELET
Basophils Absolute: 0 10*3/uL (ref 0.0–0.1)
Basophils Relative: 0 % (ref 0.0–3.0)
Eosinophils Absolute: 0 10*3/uL (ref 0.0–0.7)
Lymphocytes Relative: 21.2 % (ref 12.0–46.0)
MCHC: 31.2 g/dL (ref 30.0–36.0)
MCV: 77.1 fl — ABNORMAL LOW (ref 78.0–100.0)
Monocytes Absolute: 0.3 10*3/uL (ref 0.1–1.0)
Neutrophils Relative %: 70.5 % (ref 43.0–77.0)
Platelets: 180 10*3/uL (ref 150.0–400.0)
RBC: 3.39 Mil/uL — ABNORMAL LOW (ref 3.87–5.11)
RDW: 22.9 % — ABNORMAL HIGH (ref 11.5–14.6)

## 2011-12-12 LAB — IBC PANEL
Iron: 43 ug/dL (ref 42–145)
Saturation Ratios: 8.5 % — ABNORMAL LOW (ref 20.0–50.0)
Transferrin: 362 mg/dL — ABNORMAL HIGH (ref 212.0–360.0)

## 2011-12-12 MED ORDER — SUCRALFATE 1 GM/10ML PO SUSP: 1.0000 g | Freq: Four times a day (QID) | ORAL | Status: DC

## 2011-12-12 NOTE — Assessment & Plan Note (Signed)
Concern that discomfort is suboptimally control acid related symptoms.  Plan - add carafate suspension 1 g AC/HS to her regimen.           For lack of improvement in her symptoms will consider GI consult.

## 2011-12-12 NOTE — Patient Instructions (Signed)
Discomfort with eating and pain in the stomach and throat. Worried about reflux and ulcer disease.  Plan - lab today to check on blood counts           New medication: carafate (sucralfate) suspension: take 10 cc before meals and at bedtime  Blood pressure - OK  Plan - will check routine lab.  Anemia - a problem in the past, due to blood loss  Plan - lab today: blood count and iron panel.   You should always eat sitting up to minimize any problem with choking or aspiration.

## 2011-12-12 NOTE — Assessment & Plan Note (Signed)
No complaint of chest pain.  

## 2011-12-12 NOTE — Assessment & Plan Note (Signed)
BP Readings from Last 3 Encounters:  12/12/11 152/78  08/21/11 138/80  06/27/11 138/72   Running a little elevated today but previously well controlled.  Plan - continue present medications

## 2011-12-12 NOTE — Progress Notes (Signed)
  Subjective:    Patient ID: [name], female    DOB: [birth year], 78 y.o.   MRN: [id number]  HPI [name] presents for substernal pain with eating and pain in the stomach with eating. She is able to swallow w/o difficulty. She has had no signs of bleeding: no hematemesis, no hematochezia. She is taking protonix but her symptoms persist. She has not lost weight by her report. She has a h/o PUD and Upper GI problems. She did have a large LGI bleed in October '12 - finally stopped. she spent several weeks at [facility] then [facility] and has been home since Christmas. She is eating and she does remain independent with personal care, she is cooking and getting about.   Past Medical History  Diagnosis Date  . Internal hemorrhoid   . Diverticulosis of colon (without mention of hemorrhage)   . Herpes zoster   . Glaucoma   . Lumbar back pain   . Hypertension   . Cardiomyopathy   . Hypothyroid    Past Surgical History  Procedure Date  . Thyroidectomy   . Appendectomy   . Tubal ligation    No family history on file. History   Social History  . Marital Status: Widowed    Spouse Name: N/A    Number of Children: N/A  . Years of Education: N/A   Occupational History  . Not on file.   Social History Main Topics  . Smoking status: Never Smoker   . Smokeless tobacco: Not on file  . Alcohol Use: Not on file  . Drug Use: Not on file  . Sexually Active: Not on file   Other Topics Concern  . Not on file   Social History Narrative   Lives alone but family stay with her       Review of Systems System review is negative for any constitutional, cardiac, pulmonary, GI or neuro symptoms or complaints other than as described in the HPI.     Objective:   Physical Exam Filed Vitals:   12/12/11 1024  BP: 152/78  Pulse: 88  Temp: 98.5 F (36.9 C)  Resp: 14   Wt Readings from Last 3 Encounters:  12/12/11 87 lb (39.463 kg)  08/21/11 85 lb (38.556 kg)  06/27/11 87 lb  (39.463 kg)   Gen'l- a slight, elderly AA woman nicely dressed for a portrait session today in no distress HEENT- C&S clear,  Neck- supple, no thyromegaly Chest - no deformity, excellent posture Cor- 2+ radial pulse, RRR Pulm - normal respirations, lungs are clear w/o rales or wheezes Abd- BS+ x 4, soft, no guarding or rebound, tender to deep palpation in the epigastrium.  Lab Results  Component Value Date   WBC 3.9* 12/12/2011   HGB 8.2 Repeated and verified X2.* 12/12/2011   HCT 26.1* 12/12/2011   PLT 180.0 12/12/2011   GLUCOSE 112* 12/12/2011   CHOL 258* 01/07/2008   TRIG 78 01/07/2008   HDL 98.8 01/07/2008   LDLDIRECT 140.0 01/07/2008   ALT 14 12/12/2011   AST 29 12/12/2011   NA 139 12/12/2011   K 3.9 12/12/2011   CL 103 12/12/2011   CREATININE 1.2 12/12/2011   BUN 28* 12/12/2011   CO2 28 12/12/2011   TSH 0.77 12/12/2011   INR 1.13 07/02/2011        Assessment & Plan:

## 2011-12-12 NOTE — Assessment & Plan Note (Addendum)
Patient with some weakness and abdominal discomfort with a h/o acute blood loss.  Plan - CBC with recommendations to follow  Addendum- Hgb 8.2 which has been stable over last three determinations = anemia of chronic disease

## 2011-12-13 ENCOUNTER — Other Ambulatory Visit: Payer: Self-pay | Admitting: Internal Medicine

## 2011-12-13 NOTE — Telephone Encounter (Signed)
Zoloft request [last refill 02.24.13 #30x0]

## 2011-12-17 ENCOUNTER — Encounter: Payer: Self-pay | Admitting: Internal Medicine

## 2011-12-25 ENCOUNTER — Other Ambulatory Visit: Payer: Self-pay | Admitting: Internal Medicine

## 2012-01-10 ENCOUNTER — Other Ambulatory Visit: Payer: Self-pay | Admitting: Internal Medicine

## 2012-01-18 ENCOUNTER — Other Ambulatory Visit: Payer: Self-pay | Admitting: Internal Medicine

## 2012-02-06 ENCOUNTER — Other Ambulatory Visit: Payer: Self-pay

## 2012-02-06 MED ORDER — NITROGLYCERIN 0.4 MG SL SUBL: 0.4000 mg | SUBLINGUAL_TABLET | SUBLINGUAL | Status: DC | PRN

## 2012-02-23 ENCOUNTER — Emergency Department (HOSPITAL_COMMUNITY)
Admission: EM | Admit: 2012-02-23 | Discharge: 2012-02-23 | Disposition: A | Discharge status: Home / Self Care | Payer: Medicare HMO | Attending: Emergency Medicine | Admitting: Emergency Medicine

## 2012-02-23 ENCOUNTER — Encounter (HOSPITAL_COMMUNITY): Payer: Self-pay | Admitting: *Deleted

## 2012-02-23 DIAGNOSIS — N390 Urinary tract infection, site not specified: Secondary | ICD-10-CM

## 2012-02-23 DIAGNOSIS — I428 Other cardiomyopathies: Secondary | ICD-10-CM | POA: Insufficient documentation

## 2012-02-23 DIAGNOSIS — I10 Essential (primary) hypertension: Secondary | ICD-10-CM | POA: Insufficient documentation

## 2012-02-23 DIAGNOSIS — Z79899 Other long term (current) drug therapy: Secondary | ICD-10-CM | POA: Insufficient documentation

## 2012-02-23 DIAGNOSIS — Z7982 Long term (current) use of aspirin: Secondary | ICD-10-CM | POA: Insufficient documentation

## 2012-02-23 DIAGNOSIS — R110 Nausea: Secondary | ICD-10-CM | POA: Insufficient documentation

## 2012-02-23 LAB — DIFFERENTIAL
Basophils Absolute: 0 10*3/uL (ref 0.0–0.1)
Eosinophils Relative: 2 % (ref 0–5)
Lymphocytes Relative: 33 % (ref 12–46)
Lymphs Abs: 1.3 10*3/uL (ref 0.7–4.0)
Monocytes Absolute: 0.4 10*3/uL (ref 0.1–1.0)
Neutro Abs: 2.3 10*3/uL (ref 1.7–7.7)

## 2012-02-23 LAB — CBC
HCT: 27.9 % — ABNORMAL LOW (ref 36.0–46.0)
MCV: 80.6 fL (ref 78.0–100.0)
Platelets: 165 10*3/uL (ref 150–400)
RBC: 3.46 MIL/uL — ABNORMAL LOW (ref 3.87–5.11)
RDW: 19.1 % — ABNORMAL HIGH (ref 11.5–15.5)
WBC: 4.1 10*3/uL (ref 4.0–10.5)

## 2012-02-23 LAB — URINE MICROSCOPIC-ADD ON

## 2012-02-23 LAB — URINALYSIS, ROUTINE W REFLEX MICROSCOPIC
Glucose, UA: NEGATIVE mg/dL
Hgb urine dipstick: NEGATIVE
pH: 6.5 (ref 5.0–8.0)

## 2012-02-23 LAB — BASIC METABOLIC PANEL
CO2: 31 mEq/L (ref 19–32)
Calcium: 9.5 mg/dL (ref 8.4–10.5)
Chloride: 101 mEq/L (ref 96–112)
Glucose, Bld: 97 mg/dL (ref 70–99)
Sodium: 138 mEq/L (ref 135–145)

## 2012-02-23 MED ORDER — CEPHALEXIN 500 MG PO CAPS: 500.0000 mg | ORAL_CAPSULE | Freq: Three times a day (TID) | ORAL | Status: AC

## 2012-02-23 MED ORDER — SODIUM CHLORIDE 0.9 % IV SOLN
INTRAVENOUS | Status: DC
  Administered 2012-02-23: 08:00:00 via INTRAVENOUS

## 2012-02-23 NOTE — ED Provider Notes (Signed)
I saw and evaluated the patient, reviewed the resident's note and I agree with the findings and plan.  Cheri Guppy, MD 02/23/12 1039

## 2012-02-23 NOTE — Discharge Instructions (Signed)
UTI may be causing nausea. Take antibiotic three times daily until completed in 10 days. Make an appointment with Dr. Debby Bud to follow up. If you have any vomiting, fevers, worsened symptoms then follow up sooner in the emergency department. Be sure to drink plenty of water.   Urinary Tract Infection Infections of the urinary tract can start in several places. A bladder infection (cystitis), a kidney infection (pyelonephritis), and a prostate infection (prostatitis) are different types of urinary tract infections (UTIs). They usually get better if treated with medicines (antibiotics) that kill germs. Take all the medicine until it is gone. You or your child may feel better in a few days, but TAKE ALL MEDICINE or the infection may not respond and may become more difficult to treat. HOME CARE INSTRUCTIONS   Drink enough water and fluids to keep the urine clear or pale yellow. Cranberry juice is especially recommended, in addition to large amounts of water.   Avoid caffeine, tea, and carbonated beverages. They tend to irritate the bladder.   Alcohol may irritate the prostate.   Only take over-the-counter or prescription medicines for pain, discomfort, or fever as directed by your caregiver.  To prevent further infections:  Empty the bladder often. Avoid holding urine for long periods of time.   After a bowel movement, women should cleanse from front to back. Use each tissue only once.   Empty the bladder before and after sexual intercourse.  FINDING OUT THE RESULTS OF YOUR TEST Not all test results are available during your visit. If your or your child's test results are not back during the visit, make an appointment with your caregiver to find out the results. Do not assume everything is normal if you have not heard from your caregiver or the medical facility. It is important for you to follow up on all test results. SEEK MEDICAL CARE IF:   There is back pain.   Your baby is older than 3  months with a rectal temperature of 100.5 F (38.1 C) or higher for more than 1 day.   Your or your child's problems (symptoms) are no better in 3 days. Return sooner if you or your child is getting worse.  SEEK IMMEDIATE MEDICAL CARE IF:   There is severe back pain or lower abdominal pain.   You or your child develops chills.   You have a fever.   Your baby is older than 3 months with a rectal temperature of 102 F (38.9 C) or higher.   Your baby is 59 months old or younger with a rectal temperature of 100.4 F (38 C) or higher.   There is nausea or vomiting.   There is continued burning or discomfort with urination.  MAKE SURE YOU:   Understand these instructions.   Will watch your condition.   Will get help right away if you are not doing well or get worse.  Document Released: 06/14/2005 Document Revised: 08/24/2011 Document Reviewed: 01/17/2007 [facility] Patient Information 2012 [place], [place].

## 2012-02-23 NOTE — ED Provider Notes (Signed)
History     CSN: [id number]  Arrival date & time 02/23/12  0700   First MD Initiated Contact with Patient 02/23/12 [phone]      Chief Complaint  Patient presents with  . Nausea  . Weakness    (Consider location/radiation/quality/duration/timing/severity/associated sxs/prior treatment) HPI Comments: 78 yo female with history of GI bleed, diverticulitis, HTN presents with 3 days of worsening nausea and generalized weakness. Had very poor appetite as well. Being treated for some epigastric/upper abdominal pain symptomatically by PCP with PPI and carafate which she has continued. Her daughter brings the patient in for concern about these similar symptoms leading to hosptalization in 2012 for severe GI blood loss to the point patient was discharged on hospice at that time. She spent short time at SNF, but has been living at home and her children visit and take turns caring for her now. Patient also endorses feeling some left shoulder pain radiating to her arm, intermittent, lasting several minutes, worsened with arm movement, relieved by tylenol. Has stable exertional dyspnea, denies chest pain, emesis, diarrhea, bloody or dark stools, abdominal pain, syncope.   Patient is a 78 y.o. female presenting with weakness.  Weakness Primary symptoms do not include fever.  Additional symptoms include weakness.    Past Medical History  Diagnosis Date  . Internal hemorrhoid   . Diverticulosis of colon (without mention of hemorrhage)   . Herpes zoster   . Glaucoma   . Lumbar back pain   . Hypertension   . Cardiomyopathy   . Hypothyroid     Past Surgical History  Procedure Date  . Thyroidectomy   . Appendectomy   . Tubal ligation     No family history on file.  History  Substance Use Topics  . Smoking status: Never Smoker   . Smokeless tobacco: Never Used  . Alcohol Use: No    OB History    Grav Para Term Preterm Abortions TAB SAB Ect Mult Living                  Review of Systems   Constitutional: Positive for fatigue. Negative for fever, chills and activity change.  HENT: Negative for neck pain.   Respiratory: Negative for cough and chest tightness.   Cardiovascular: Negative for chest pain, palpitations and leg swelling.  Genitourinary: Negative for dysuria and difficulty urinating.  Musculoskeletal: Negative for joint swelling.  Neurological: Positive for weakness. Negative for syncope.  Psychiatric/Behavioral: Negative for confusion.  All other systems reviewed and are negative.    Allergies  Review of patient's allergies indicates no known allergies.  Home Medications   Current Outpatient Rx  Name Route Sig Dispense Refill  . ASPIRIN 81 MG PO TABS Oral Take 81 mg by mouth daily.      . B COMPLEX VITAMINS PO CAPS Oral Take 1 capsule by mouth daily.      Marland Kitchen FLUTICASONE FUROATE 27.5 MCG/SPRAY NA SUSP Nasal Place 2 sprays into the nose daily as needed. For allergies    . ISOSORBIDE MONONITRATE ER 30 MG PO TB24  TAKE 1 TABLET EVERY DAY 30 tablet 3  . LEVOTHYROXINE SODIUM 88 MCG PO TABS  TAKE 1 TABLET BY MOUTH ONCE A DAY 30 tablet 5  . LISINOPRIL-HYDROCHLOROTHIAZIDE 10-12.5 MG PO TABS  TAKE 1 TABLET BY MOUTH DAILY. 30 tablet 5  . PANTOPRAZOLE SODIUM 40 MG PO TBEC  TAKE 1 TABLET BY MOUTH DAILY 30 tablet 5  . SERTRALINE HCL 25 MG PO TABS  TAKE 1  TABLET BY MOUTH DAILY 30 tablet 5  . SUCRALFATE 1 GM/10ML PO SUSP Oral Take 1 g by mouth 4 (four) times daily.    Marland Kitchen NITROGLYCERIN 0.4 MG SL SUBL Sublingual Place 1 tablet (0.4 mg total) under the tongue every 5 (five) minutes as needed. 30 tablet 5  . SUCRALFATE 1 GM/10ML PO SUSP Oral Take 10 mLs (1 g total) by mouth 4 (four) times daily. 10 ccs before meals and bedtime. 1200 mL 3    BP 166/78  Pulse 63  Temp(Src) 98.1 F (36.7 C) (Oral)  SpO2 99%  Physical Exam  Vitals reviewed. Constitutional: No distress.       Frail, appears cachectic, lying in bed.  HENT:  Head: Normocephalic and atraumatic.  Mouth/Throat:  Oropharynx is clear and moist.  Eyes: EOM are normal. Pupils are equal, round, and reactive to light.  Neck: No JVD present.  Cardiovascular: Regular rhythm and normal heart sounds.   No murmur heard.      bradycardia  Pulmonary/Chest: Effort normal and breath sounds normal. No respiratory distress. She has no wheezes.  Abdominal: Soft. Bowel sounds are normal. She exhibits no distension. There is no tenderness. There is no rebound and no guarding.  Musculoskeletal: She exhibits no edema and no tenderness.  Lymphadenopathy:    She has no cervical adenopathy.  Neurological: She is alert. She exhibits normal muscle tone. Coordination normal.       Speech normal. Hard of hearing. Answers questions appropriately.  Skin: No rash noted.    ED Course  Procedures (including critical care time)  Labs Reviewed  CBC - Abnormal; Notable for the following:    RBC 3.46 (*)    Hemoglobin 8.7 (*)    HCT 27.9 (*)    MCH 25.1 (*)    RDW 19.1 (*)    All other components within normal limits  BASIC METABOLIC PANEL - Abnormal; Notable for the following:    BUN 29 (*)    GFR calc non Af Amer 47 (*)    GFR calc Af Amer 54 (*)    All other components within normal limits  URINALYSIS, ROUTINE W REFLEX MICROSCOPIC - Abnormal; Notable for the following:    Leukocytes, UA SMALL (*)    All other components within normal limits  URINE MICROSCOPIC-ADD ON - Abnormal; Notable for the following:    Bacteria, UA MANY (*)    All other components within normal limits  DIFFERENTIAL   No results found.   No diagnosis found.   Date: 02/23/2012  Rate: 55  Rhythm: sinus bradycardia  QRS Axis: normal  Intervals: borderline PR 188  ST/T Wave abnormalities: normal  Conduction Disutrbances:none  Narrative Interpretation:   Old EKG Reviewed: normal    MDM  78 yo female with hx of GIB presents with nausea, weakness. Basic labs, EKG within normal limits, hemoglobin stable. UA cath specimen showing likely  UTI, will send culture and treat with keflex. Discuss with patient and daughter return to PCP for f/u and seek emergency care if unable to take medications, emesis, blood in stool or other concerns.         Durwin Reges, MD 02/23/12 [phone]

## 2012-02-23 NOTE — ED Provider Notes (Signed)
I saw and evaluated the patient, reviewed the resident's note and I agree with the findings and plan. 5 y female hx of anemia c/o nausea and weakness.  No cough, fever, sob. No vomiting, diarrhea, uti sxs.  pe cachectic, frail female. No distress.  Hrt, lungs, abd nl.   Labs + signs uti on cath ua.  + anemia but hgb actually higher than last recorded hgb. Will tx with abxs as outpt.  Cheri Guppy, MD 02/23/12 1038

## 2012-02-23 NOTE — ED Notes (Signed)
edmd notifited of pt's heart rate

## 2012-02-23 NOTE — ED Notes (Signed)
Pt reports feeling weak and nauseated over last week. Family member reports it seems worse today. Denies vomiting, diarrhea. Hx diverticulitis, abd sometimes, not currently. Denies blood in stool.

## 2012-02-26 LAB — URINE CULTURE
Colony Count: >=100000
Culture  Setup Time: 201306071943

## 2012-02-27 NOTE — ED Notes (Signed)
+   Urine Patient treated with keflex-sensitive to same-chart appended per protocol MD. 

## 2012-03-05 ENCOUNTER — Other Ambulatory Visit: Payer: Self-pay | Admitting: Internal Medicine

## 2012-03-13 ENCOUNTER — Encounter: Payer: Self-pay | Admitting: Internal Medicine

## 2012-03-13 ENCOUNTER — Ambulatory Visit (INDEPENDENT_AMBULATORY_CARE_PROVIDER_SITE_OTHER): Payer: Medicare HMO | Admitting: Internal Medicine

## 2012-03-13 VITALS — BP 160/70 | HR 80 | Temp 98.7°F | Resp 15 | Wt 86.0 lb

## 2012-03-13 DIAGNOSIS — N390 Urinary tract infection, site not specified: Secondary | ICD-10-CM

## 2012-03-13 DIAGNOSIS — K219 Gastro-esophageal reflux disease without esophagitis: Secondary | ICD-10-CM

## 2012-03-13 DIAGNOSIS — D62 Acute posthemorrhagic anemia: Secondary | ICD-10-CM

## 2012-03-13 NOTE — Assessment & Plan Note (Signed)
Symptoms are suboptimally controlled  Plan  Continue protonix daily  Reduce carafate to twice a day on a regular basis  Gas-Ex as needed.

## 2012-03-13 NOTE — Assessment & Plan Note (Signed)
Patient with chronic anemia with Hgb in the 8 g range since October.

## 2012-03-13 NOTE — Progress Notes (Signed)
Subjective:    Patient ID: [name], female    DOB: 27-May-1917, 78 y.o.   MRN: [id number]  HPI Since her last visit Mrs. Medici did have discomfort and was seen at ED June 7th (all records reviewed) - diagnosed with UTI and treated with Keflex. Urine Cx - Klebsiella that was sensitive to cefazolin. She has gotten better. Her Hgb was 8.7 - which is her baseline.  She has not been taking Carafate on a regular basis but has been taking protonix. She does c/o mild epigastric discomfort and nausea.   Past Medical History  Diagnosis Date  . Internal hemorrhoid   . Diverticulosis of colon (without mention of hemorrhage)   . Herpes zoster   . Glaucoma   . Lumbar back pain   . Hypertension   . Cardiomyopathy   . Hypothyroid    Past Surgical History  Procedure Date  . Thyroidectomy   . Appendectomy   . Tubal ligation    No family history on file. History   Social History  . Marital Status: Widowed    Spouse Name: N/A    Number of Children: N/A  . Years of Education: N/A   Occupational History  . Not on file.   Social History Main Topics  . Smoking status: Never Smoker   . Smokeless tobacco: Never Used  . Alcohol Use: No  . Drug Use: No  . Sexually Active: Not Currently   Other Topics Concern  . Not on file   Social History Narrative   Lives alone but family stay with her at night. She remains independent. She has a DNR established but does want medical care.    Current Outpatient Prescriptions on File Prior to Visit  Medication Sig Dispense Refill  . aspirin 81 MG tablet Take 81 mg by mouth daily.        Marland Kitchen b complex vitamins capsule Take 1 capsule by mouth daily.        . fluticasone (VERAMYST) 27.5 MCG/SPRAY nasal spray Place 2 sprays into the nose daily as needed. For allergies      . isosorbide mononitrate (IMDUR) 30 MG 24 hr tablet TAKE 1 TABLET EVERY DAY  30 tablet  3  . levothyroxine (SYNTHROID, LEVOTHROID) 88 MCG tablet TAKE 1 TABLET BY MOUTH ONCE A DAY  30  tablet  5  . lisinopril-hydrochlorothiazide (PRINZIDE,ZESTORETIC) 10-12.5 MG per tablet TAKE 1 TABLET BY MOUTH DAILY.  30 tablet  5  . nitroGLYCERIN (NITROSTAT) 0.4 MG SL tablet Place 1 tablet (0.4 mg total) under the tongue every 5 (five) minutes as needed.  30 tablet  5  . pantoprazole (PROTONIX) 40 MG tablet TAKE 1 TABLET BY MOUTH DAILY  30 tablet  5  . sertraline (ZOLOFT) 25 MG tablet TAKE 1 TABLET BY MOUTH DAILY  30 tablet  5  . sucralfate (CARAFATE) 1 GM/10ML suspension Take 1 g by mouth 4 (four) times daily.      . sucralfate (CARAFATE) 1 GM/10ML suspension Take 10 mLs (1 g total) by mouth 4 (four) times daily. 10 ccs before meals and bedtime.  1200 mL  3  .       Marland Kitchen           Review of Systems System review is negative for any constitutional, cardiac, pulmonary, GI or neuro symptoms or complaints other than as described in the HPI.     Objective:   Physical Exam Filed Vitals:   03/13/12 1046  BP: 160/70  Pulse: 80  Temp: 98.7 F (37.1 C)  Resp: 15   Weight: 86 lb (39.009 kg)  Gen'l- elderly, very thin AA woman in no acute distress HEENT - C&S clear Cor- 2+ radial, RRR Pulm - RRR Abd - BS+ x 4, soft, no guarding or rebound Neuro - soft spoken but awake and alert.        Assessment & Plan:  UTI - Klebsiella UTI treated with Keflex - symptoms resolved.

## 2012-03-13 NOTE — Patient Instructions (Addendum)
Urinary track infection - appears to be resolved.  Stomach - we want to stay ahead of the game with our treatments. Plan 1. Take the protonix every morning  2. Take the carafate twice a day: AM and PM  3. For gas and bloating - ok to take "Gas-Ex"  From over the counter  4. Continue aspirin M-W-F  Blood pressure - a little high today but will not need to change medications today,   Thanks for coming to see me today.

## 2012-04-08 ENCOUNTER — Other Ambulatory Visit: Payer: Self-pay | Admitting: Internal Medicine

## 2012-04-24 ENCOUNTER — Other Ambulatory Visit: Payer: Self-pay | Admitting: Internal Medicine

## 2012-04-24 NOTE — Telephone Encounter (Signed)
Med refil isosorbide sent to [facility]

## 2012-07-07 ENCOUNTER — Encounter (HOSPITAL_COMMUNITY): Payer: Self-pay | Admitting: *Deleted

## 2012-07-07 ENCOUNTER — Emergency Department (HOSPITAL_COMMUNITY)
Admission: EM | Admit: 2012-07-07 | Discharge: 2012-07-07 | Disposition: A | Discharge status: Home / Self Care | Payer: Medicare HMO | Attending: Emergency Medicine | Admitting: Emergency Medicine

## 2012-07-07 ENCOUNTER — Emergency Department (HOSPITAL_COMMUNITY): Payer: Medicare HMO

## 2012-07-07 DIAGNOSIS — Z7982 Long term (current) use of aspirin: Secondary | ICD-10-CM | POA: Insufficient documentation

## 2012-07-07 DIAGNOSIS — R109 Unspecified abdominal pain: Secondary | ICD-10-CM | POA: Insufficient documentation

## 2012-07-07 DIAGNOSIS — R110 Nausea: Secondary | ICD-10-CM | POA: Insufficient documentation

## 2012-07-07 DIAGNOSIS — Z79899 Other long term (current) drug therapy: Secondary | ICD-10-CM | POA: Insufficient documentation

## 2012-07-07 DIAGNOSIS — Z9089 Acquired absence of other organs: Secondary | ICD-10-CM | POA: Insufficient documentation

## 2012-07-07 DIAGNOSIS — I10 Essential (primary) hypertension: Secondary | ICD-10-CM | POA: Insufficient documentation

## 2012-07-07 DIAGNOSIS — K219 Gastro-esophageal reflux disease without esophagitis: Secondary | ICD-10-CM | POA: Insufficient documentation

## 2012-07-07 DIAGNOSIS — E039 Hypothyroidism, unspecified: Secondary | ICD-10-CM | POA: Insufficient documentation

## 2012-07-07 DIAGNOSIS — H409 Unspecified glaucoma: Secondary | ICD-10-CM | POA: Insufficient documentation

## 2012-07-07 LAB — CBC WITH DIFFERENTIAL/PLATELET
Hemoglobin: 9.9 g/dL — ABNORMAL LOW (ref 12.0–15.0)
Lymphocytes Relative: 27 % (ref 12–46)
Lymphs Abs: 1.1 10*3/uL (ref 0.7–4.0)
MCH: 27.1 pg (ref 26.0–34.0)
MCV: 84.4 fL (ref 78.0–100.0)
Monocytes Relative: 7 % (ref 3–12)
Neutrophils Relative %: 65 % (ref 43–77)
Platelets: 136 10*3/uL — ABNORMAL LOW (ref 150–400)
RBC: 3.65 MIL/uL — ABNORMAL LOW (ref 3.87–5.11)
WBC: 4.1 10*3/uL (ref 4.0–10.5)

## 2012-07-07 LAB — URINALYSIS, ROUTINE W REFLEX MICROSCOPIC
Bilirubin Urine: NEGATIVE
Hgb urine dipstick: NEGATIVE
Nitrite: NEGATIVE
Specific Gravity, Urine: 1.014 (ref 1.005–1.030)

## 2012-07-07 LAB — COMPREHENSIVE METABOLIC PANEL
ALT: 21 U/L (ref 0–35)
Alkaline Phosphatase: 82 U/L (ref 39–117)
BUN: 21 mg/dL (ref 6–23)
CO2: 28 mEq/L (ref 19–32)
GFR calc Af Amer: 54 mL/min — ABNORMAL LOW (ref >90)
GFR calc non Af Amer: 46 mL/min — ABNORMAL LOW (ref >90)
Glucose, Bld: 116 mg/dL — ABNORMAL HIGH (ref 70–99)
Potassium: 3.5 mEq/L (ref 3.5–5.1)
Sodium: 139 mEq/L (ref 135–145)
Total Bilirubin: 0.6 mg/dL (ref 0.3–1.2)

## 2012-07-07 LAB — URINE MICROSCOPIC-ADD ON

## 2012-07-07 LAB — LIPASE, BLOOD: Lipase: 20 U/L (ref 11–59)

## 2012-07-07 MED ORDER — SODIUM CHLORIDE 0.9 % IV SOLN
INTRAVENOUS | Status: DC
  Administered 2012-07-07: 09:00:00 via INTRAVENOUS

## 2012-07-07 MED ORDER — SODIUM CHLORIDE 0.9 % IV BOLUS (SEPSIS)
500.0000 mL | Freq: Once | INTRAVENOUS | Status: AC
  Administered 2012-07-07: 500 mL via INTRAVENOUS

## 2012-07-07 MED ORDER — ONDANSETRON 8 MG PO TBDP: ORAL_TABLET | ORAL | Status: DC

## 2012-07-07 MED ORDER — FAMOTIDINE IN NACL 20-0.9 MG/50ML-% IV SOLN
20.0000 mg | Freq: Once | INTRAVENOUS | Status: AC
  Administered 2012-07-07: 20 mg via INTRAVENOUS
  Filled 2012-07-07: qty 50

## 2012-07-07 MED ORDER — HYDROCODONE-ACETAMINOPHEN 5-325 MG PO TABS: 1.0000 | ORAL_TABLET | Freq: Four times a day (QID) | ORAL | Status: DC | PRN

## 2012-07-07 MED ORDER — ONDANSETRON HCL 4 MG/2ML IJ SOLN
4.0000 mg | Freq: Once | INTRAMUSCULAR | Status: AC
  Administered 2012-07-07: 4 mg via INTRAVENOUS
  Filled 2012-07-07: qty 2

## 2012-07-07 NOTE — ED Notes (Signed)
Per pt son, states pt daughter called and said pt has UTI. Pt denies urinary symptoms, states she has urge to go frequently but denies pain

## 2012-07-07 NOTE — ED Provider Notes (Signed)
History     CSN: [id number]  Arrival date & time 07/07/12  4098   First MD Initiated Contact with Patient 07/07/12 [phone]      Chief Complaint  Patient presents with  . Abdominal Pain  . Nausea    (Consider location/radiation/quality/duration/timing/severity/associated sxs/prior treatment) HPI Is 78 year old female is brought in by family, the patient is alone during the day and family state with her at night, she has a history of reflux and hypertension, she apparently may or may not have a history of coronary artery disease with prior records stating a unclear history of cardiomyopathy and angina without episodes of chest pain, she has a long-standing history of certain foods causing acid reflux symptoms which has not changed recently, she states since yesterday she has had mild upper abdominal pain off and on in the epigastric region with nausea waxing and waning lasting at least several minutes at a time, she denies chest pain cough shortness of breath, she denies vomiting or diarrhea, she denies any blood in her stools, she denies any dysuria, her pain is mild, there is no treatment prior to arrival.  She is quite pleasant but somewhat of a difficult historian to recall and determine the exact nature and timing of her symptoms, but as best as I can tell started yesterday. Past Medical History  Diagnosis Date  . Internal hemorrhoid   . Diverticulosis of colon (without mention of hemorrhage)   . Herpes zoster   . Glaucoma(365)   . Lumbar back pain   . Hypertension   . Cardiomyopathy   . Hypothyroid     Past Surgical History  Procedure Date  . Thyroidectomy   . Appendectomy   . Tubal ligation     History reviewed. No pertinent family history.  History  Substance Use Topics  . Smoking status: Never Smoker   . Smokeless tobacco: Never Used  . Alcohol Use: No    OB History    Grav Para Term Preterm Abortions TAB SAB Ect Mult Living                  Review of  Systems 10 Systems reviewed and are negative for acute change except as noted in the HPI. Allergies  Review of patient's allergies indicates no known allergies.  Home Medications   Current Outpatient Rx  Name Route Sig Dispense Refill  . ASPIRIN 81 MG PO TABS Oral Take 81 mg by mouth daily.      . B COMPLEX VITAMINS PO CAPS Oral Take 1 capsule by mouth daily.      Marland Kitchen FLUTICASONE FUROATE 27.5 MCG/SPRAY NA SUSP Nasal Place 2 sprays into the nose daily as needed. For allergies    . ISOSORBIDE MONONITRATE ER 30 MG PO TB24  TAKE 1 TABLET EVERY DAY 30 tablet 11  . LEVOTHYROXINE SODIUM 88 MCG PO TABS  TAKE 1 TABLET BY MOUTH ONCE A DAY 30 tablet 5  . LISINOPRIL-HYDROCHLOROTHIAZIDE 10-12.5 MG PO TABS  TAKE 1 TABLET BY MOUTH DAILY. 30 tablet 5  . PANTOPRAZOLE SODIUM 40 MG PO TBEC  TAKE 1 TABLET BY MOUTH DAILY 30 tablet 5  . SERTRALINE HCL 25 MG PO TABS  TAKE 1 TABLET BY MOUTH DAILY 30 tablet 5  . SUCRALFATE 1 GM/10ML PO SUSP Oral Take 1 g by mouth 4 (four) times daily.    Marland Kitchen HYDROCODONE-ACETAMINOPHEN 5-325 MG PO TABS Oral Take 1 tablet by mouth every 6 (six) hours as needed for pain. 6 tablet 0  .  NITROGLYCERIN 0.4 MG SL SUBL Sublingual Place 1 tablet (0.4 mg total) under the tongue every 5 (five) minutes as needed. 30 tablet 5  . ONDANSETRON 8 MG PO TBDP  8mg  ODT q4 hours prn nausea 4 tablet 0  . SUCRALFATE 1 GM/10ML PO SUSP Oral Take 10 mLs (1 g total) by mouth 4 (four) times daily. 10 ccs before meals and bedtime. 1200 mL 3    BP 160/64  Pulse 60  Temp 98 F (36.7 C) (Oral)  Resp 18  SpO2 100%  Physical Exam  Nursing note and vitals reviewed. Constitutional:       Awake, alert, nontoxic appearance.  HENT:  Head: Atraumatic.  Eyes: Right eye exhibits no discharge. Left eye exhibits no discharge.  Neck: Neck supple.  Cardiovascular: Normal rate and regular rhythm.   No murmur heard.       Occasional irregular beats  Pulmonary/Chest: Effort normal and breath sounds normal. No  respiratory distress. She has no wheezes. She has no rales. She exhibits no tenderness.  Abdominal: Soft. Bowel sounds are normal. She exhibits no distension and no mass. There is tenderness. There is no rebound and no guarding.       Mild epigastric tenderness with the remainder the abdomen nontender and no rebound  Musculoskeletal: She exhibits no tenderness.       Baseline ROM, no obvious new focal weakness.  Neurological: She is alert.       Mental status and motor strength appears baseline for patient and situation.  Skin: No rash noted.  Psychiatric: She has a normal mood and affect.    ED Course  Procedures (including critical care time) ECG: Sinus rhythm, ventricular rate 64, left ventricular hypertrophy, septal Q waves, no acute ischemic changes noted, no significant change noted compared with October 2012 Labs Reviewed  CBC WITH DIFFERENTIAL - Abnormal; Notable for the following:    RBC 3.65 (*)     Hemoglobin 9.9 (*)     HCT 30.8 (*)     RDW 16.6 (*)     Platelets 136 (*)     All other components within normal limits  COMPREHENSIVE METABOLIC PANEL - Abnormal; Notable for the following:    Glucose, Bld 116 (*)     AST 38 (*)     GFR calc non Af Amer 46 (*)     GFR calc Af Amer 54 (*)     All other components within normal limits  URINALYSIS, ROUTINE W REFLEX MICROSCOPIC - Abnormal; Notable for the following:    Leukocytes, UA TRACE (*)     All other components within normal limits  TROPONIN I  LIPASE, BLOOD  URINE MICROSCOPIC-ADD ON  LAB REPORT - SCANNED   No results found.   1. Abdominal pain       MDM   Pt stable in ED with no significant deterioration in condition.  Patient / Family / Caregiver informed of clinical course, understand medical decision-making process, and agree with plan.  I doubt any other EMC precluding discharge at this time including, but not necessarily limited to the following:ACS.      Hurman Horn, MD 07/12/12 2035

## 2012-07-07 NOTE — ED Notes (Signed)
Informed pt that a urine sample would be needed.  Pt stated that at this time she did not have to urinate.

## 2012-07-07 NOTE — ED Notes (Signed)
Bednar at bedside. Pt denies chest pain or sob. Reports nausea that started yesterday. Pt central upper abdominal area tender upon palpation since yesterday.

## 2012-07-15 ENCOUNTER — Telehealth: Payer: Self-pay | Admitting: Internal Medicine

## 2012-07-15 ENCOUNTER — Inpatient Hospital Stay (HOSPITAL_COMMUNITY)
Admission: EM | Admit: 2012-07-15 | Discharge: 2012-07-23 | DRG: 394 | Disposition: A | Discharge status: Home Health | Payer: Medicare HMO | Attending: Internal Medicine | Admitting: Internal Medicine

## 2012-07-15 ENCOUNTER — Encounter (HOSPITAL_COMMUNITY): Payer: Self-pay | Admitting: *Deleted

## 2012-07-15 DIAGNOSIS — I428 Other cardiomyopathies: Secondary | ICD-10-CM

## 2012-07-15 DIAGNOSIS — Z791 Long term (current) use of non-steroidal anti-inflammatories (NSAID): Secondary | ICD-10-CM

## 2012-07-15 DIAGNOSIS — I10 Essential (primary) hypertension: Secondary | ICD-10-CM

## 2012-07-15 DIAGNOSIS — K922 Gastrointestinal hemorrhage, unspecified: Secondary | ICD-10-CM

## 2012-07-15 DIAGNOSIS — K633 Ulcer of intestine: Principal | ICD-10-CM | POA: Diagnosis present

## 2012-07-15 DIAGNOSIS — K529 Noninfective gastroenteritis and colitis, unspecified: Secondary | ICD-10-CM

## 2012-07-15 DIAGNOSIS — Z66 Do not resuscitate: Secondary | ICD-10-CM | POA: Diagnosis present

## 2012-07-15 DIAGNOSIS — Z7982 Long term (current) use of aspirin: Secondary | ICD-10-CM

## 2012-07-15 DIAGNOSIS — K648 Other hemorrhoids: Secondary | ICD-10-CM | POA: Diagnosis present

## 2012-07-15 DIAGNOSIS — E039 Hypothyroidism, unspecified: Secondary | ICD-10-CM

## 2012-07-15 DIAGNOSIS — M545 Low back pain, unspecified: Secondary | ICD-10-CM

## 2012-07-15 DIAGNOSIS — K5730 Diverticulosis of large intestine without perforation or abscess without bleeding: Secondary | ICD-10-CM

## 2012-07-15 DIAGNOSIS — D62 Acute posthemorrhagic anemia: Secondary | ICD-10-CM

## 2012-07-15 DIAGNOSIS — D126 Benign neoplasm of colon, unspecified: Secondary | ICD-10-CM

## 2012-07-15 DIAGNOSIS — Z79899 Other long term (current) drug therapy: Secondary | ICD-10-CM

## 2012-07-15 LAB — COMPREHENSIVE METABOLIC PANEL
Albumin: 3.7 g/dL (ref 3.5–5.2)
Alkaline Phosphatase: 73 U/L (ref 39–117)
BUN: 38 mg/dL — ABNORMAL HIGH (ref 6–23)
Calcium: 9.4 mg/dL (ref 8.4–10.5)
Potassium: 3.8 mEq/L (ref 3.5–5.1)
Sodium: 139 mEq/L (ref 135–145)
Total Protein: 6.9 g/dL (ref 6.0–8.3)

## 2012-07-15 LAB — CBC
HCT: 25.3 % — ABNORMAL LOW (ref 36.0–46.0)
MCH: 27.9 pg (ref 26.0–34.0)
MCHC: 33.2 g/dL (ref 30.0–36.0)
RDW: 16.5 % — ABNORMAL HIGH (ref 11.5–15.5)

## 2012-07-15 NOTE — ED Notes (Signed)
Per pt's daughter - pt began having rectal bleeding that began on Saturday, bleeding has gotten progressively worse and pt is now passing clots. Pt's daughter states pt is not on blood thinners however has been hospitalized previously for diverticulitis. Pt admits to some dizziness/light-headness. Pt A&Ox4

## 2012-07-15 NOTE — Telephone Encounter (Signed)
Caller: Judith/Other; Patient Name: [name]; PCP: Illene Regulus (Adults only); Best Callback Phone Number: [phone]:  Daughter calling tonight.  States patient reported blood noted in stool in scant amount 07/13/12, again 07/14/12 with bowel movement and again today but has increased in amount today.  Daughter feels it was more than just a scant amt noted in the toilet and when wiped.  Denies symptoms.  Per Gastrointestinal Bleeding Protocol all emergent symptoms ruled out with exception of "One or more episodes of rectal bleeding and no symptoms of hypovolemia."  Patient is on Asa 81mg .  Per disposition advised to see provider within 4 hours.  Call after 4pm.  Advised to see [facility].  States will go to a [employer].  Has appointment for hospital f/u 07/16/12 and will keep at this time.  Care advice given.

## 2012-07-16 ENCOUNTER — Emergency Department (HOSPITAL_COMMUNITY): Payer: Medicare HMO

## 2012-07-16 ENCOUNTER — Ambulatory Visit: Payer: Medicare HMO | Admitting: Internal Medicine

## 2012-07-16 DIAGNOSIS — E039 Hypothyroidism, unspecified: Secondary | ICD-10-CM

## 2012-07-16 DIAGNOSIS — D62 Acute posthemorrhagic anemia: Secondary | ICD-10-CM

## 2012-07-16 DIAGNOSIS — K922 Gastrointestinal hemorrhage, unspecified: Secondary | ICD-10-CM | POA: Diagnosis present

## 2012-07-16 DIAGNOSIS — I10 Essential (primary) hypertension: Secondary | ICD-10-CM

## 2012-07-16 DIAGNOSIS — K5289 Other specified noninfective gastroenteritis and colitis: Secondary | ICD-10-CM

## 2012-07-16 LAB — PREPARE RBC (CROSSMATCH)

## 2012-07-16 LAB — CBC
Hemoglobin: 11 g/dL — ABNORMAL LOW (ref 12.0–15.0)
MCH: 27.6 pg (ref 26.0–34.0)
MCHC: 33.3 g/dL (ref 30.0–36.0)
Platelets: 138 10*3/uL — ABNORMAL LOW (ref 150–400)
RBC: 2.51 MIL/uL — ABNORMAL LOW (ref 3.87–5.11)
RDW: 15.5 % (ref 11.5–15.5)
WBC: 5 10*3/uL (ref 4.0–10.5)

## 2012-07-16 LAB — OCCULT BLOOD, POC DEVICE: Fecal Occult Bld: POSITIVE

## 2012-07-16 MED ORDER — ONDANSETRON HCL 4 MG PO TABS: 4.0000 mg | ORAL_TABLET | Freq: Four times a day (QID) | ORAL | Status: DC | PRN

## 2012-07-16 MED ORDER — CIPROFLOXACIN IN D5W 400 MG/200ML IV SOLN
400.0000 mg | Freq: Once | INTRAVENOUS | Status: AC
  Administered 2012-07-16: 400 mg via INTRAVENOUS
  Filled 2012-07-16: qty 200

## 2012-07-16 MED ORDER — SODIUM CHLORIDE 0.9 % IJ SOLN
3.0000 mL | Freq: Two times a day (BID) | INTRAMUSCULAR | Status: DC
  Administered 2012-07-16 – 2012-07-22 (×6): 3 mL via INTRAVENOUS

## 2012-07-16 MED ORDER — LEVOTHYROXINE SODIUM 88 MCG PO TABS
88.0000 ug | ORAL_TABLET | Freq: Every day | ORAL | Status: DC
  Administered 2012-07-16 – 2012-07-23 (×8): 88 ug via ORAL
  Filled 2012-07-16 (×9): qty 1

## 2012-07-16 MED ORDER — FUROSEMIDE 10 MG/ML IJ SOLN
20.0000 mg | Freq: Once | INTRAMUSCULAR | Status: AC
  Administered 2012-07-16: 20 mg via INTRAVENOUS
  Filled 2012-07-16: qty 2

## 2012-07-16 MED ORDER — FLUTICASONE PROPIONATE 50 MCG/ACT NA SUSP
2.0000 | Freq: Every day | NASAL | Status: DC
  Administered 2012-07-16 – 2012-07-23 (×8): 2 via NASAL
  Filled 2012-07-16: qty 16

## 2012-07-16 MED ORDER — PANTOPRAZOLE SODIUM 40 MG PO TBEC
40.0000 mg | DELAYED_RELEASE_TABLET | Freq: Every day | ORAL | Status: DC
  Administered 2012-07-16 – 2012-07-23 (×8): 40 mg via ORAL
  Filled 2012-07-16 (×8): qty 1

## 2012-07-16 MED ORDER — SERTRALINE HCL 25 MG PO TABS
25.0000 mg | ORAL_TABLET | Freq: Every day | ORAL | Status: DC
  Administered 2012-07-16 – 2012-07-23 (×8): 25 mg via ORAL
  Filled 2012-07-16 (×8): qty 1

## 2012-07-16 MED ORDER — CIPROFLOXACIN IN D5W 400 MG/200ML IV SOLN
400.0000 mg | Freq: Two times a day (BID) | INTRAVENOUS | Status: DC
  Administered 2012-07-16 – 2012-07-20 (×7): 400 mg via INTRAVENOUS
  Filled 2012-07-16 (×9): qty 200

## 2012-07-16 MED ORDER — FUROSEMIDE 10 MG/ML IJ SOLN
20.0000 mg | Freq: Once | INTRAMUSCULAR | Status: DC
  Filled 2012-07-16: qty 2

## 2012-07-16 MED ORDER — HYDROCODONE-ACETAMINOPHEN 5-325 MG PO TABS
1.0000 | ORAL_TABLET | ORAL | Status: DC | PRN
  Administered 2012-07-20: 1 via ORAL
  Filled 2012-07-16: qty 1

## 2012-07-16 MED ORDER — ONDANSETRON HCL 4 MG/2ML IJ SOLN
4.0000 mg | Freq: Four times a day (QID) | INTRAMUSCULAR | Status: DC | PRN
  Administered 2012-07-19 – 2012-07-20 (×3): 4 mg via INTRAVENOUS
  Filled 2012-07-16 (×3): qty 2

## 2012-07-16 MED ORDER — NITROGLYCERIN 0.4 MG SL SUBL: 0.4000 mg | SUBLINGUAL_TABLET | SUBLINGUAL | Status: DC | PRN

## 2012-07-16 MED ORDER — IOHEXOL 300 MG/ML  SOLN
70.0000 mL | Freq: Once | INTRAMUSCULAR | Status: AC | PRN
  Administered 2012-07-16: 70 mL via INTRAVENOUS

## 2012-07-16 MED ORDER — METRONIDAZOLE IN NACL 5-0.79 MG/ML-% IV SOLN
500.0000 mg | Freq: Once | INTRAVENOUS | Status: AC
  Administered 2012-07-16: 500 mg via INTRAVENOUS
  Filled 2012-07-16: qty 100

## 2012-07-16 MED ORDER — METRONIDAZOLE IN NACL 5-0.79 MG/ML-% IV SOLN
500.0000 mg | Freq: Three times a day (TID) | INTRAVENOUS | Status: DC
  Administered 2012-07-16 – 2012-07-20 (×12): 500 mg via INTRAVENOUS
  Filled 2012-07-16 (×13): qty 100

## 2012-07-16 MED ORDER — FLUTICASONE FUROATE 27.5 MCG/SPRAY NA SUSP: 2.0000 | Freq: Every day | NASAL | Status: DC

## 2012-07-16 MED ORDER — ISOSORBIDE MONONITRATE ER 30 MG PO TB24
30.0000 mg | ORAL_TABLET | Freq: Every day | ORAL | Status: DC
  Administered 2012-07-16 – 2012-07-23 (×8): 30 mg via ORAL
  Filled 2012-07-16 (×8): qty 1

## 2012-07-16 MED ORDER — DEXTROSE-NACL 5-0.9 % IV SOLN
INTRAVENOUS | Status: DC
  Administered 2012-07-16 – 2012-07-19 (×4): via INTRAVENOUS

## 2012-07-16 NOTE — ED Provider Notes (Signed)
History     CSN: [id number]  Arrival date & time 07/15/12  2152   First MD Initiated Contact with Patient 07/16/12 0005      Chief Complaint  Patient presents with  . Rectal Bleeding    (Consider location/radiation/quality/duration/timing/severity/associated sxs/prior treatment) HPI 78 year old female presents to the emergency department with complaint of blurry red blood per rectum. Patient with some blood noted in her stool on Saturday. On Sunday she began to complain of diffuse abdominal pain. Today he has had more bleeding with clots mixed with her stool. Patient has history of diverticular bleeding about a year ago requiring 8 units transfusion. Patient has complained of some weakness and dizziness, but does not complain of that now. No nausea no vomiting, no fevers. No other complaints at this time.  Past Medical History  Diagnosis Date  . Internal hemorrhoid   . Diverticulosis of colon (without mention of hemorrhage)   . Herpes zoster   . Glaucoma(365)   . Lumbar back pain   . Hypertension   . Cardiomyopathy   . Hypothyroid     Past Surgical History  Procedure Date  . Thyroidectomy   . Appendectomy   . Tubal ligation     History reviewed. No pertinent family history.  History  Substance Use Topics  . Smoking status: Never Smoker   . Smokeless tobacco: Never Used  . Alcohol Use: No    OB History    Grav Para Term Preterm Abortions TAB SAB Ect Mult Living                  Review of Systems  All other systems reviewed and are negative.    Allergies  Review of patient's allergies indicates no known allergies.  Home Medications   Current Outpatient Rx  Name Route Sig Dispense Refill  . ASPIRIN EC 81 MG PO TBEC Oral Take 81 mg by mouth daily.    . B COMPLEX VITAMINS PO CAPS Oral Take 1 capsule by mouth daily.      Marland Kitchen FLUTICASONE FUROATE 27.5 MCG/SPRAY NA SUSP Nasal Place 2 sprays into the nose daily as needed. For allergies    .  HYDROCODONE-ACETAMINOPHEN 5-325 MG PO TABS Oral Take 1 tablet by mouth every 6 (six) hours as needed for pain. 6 tablet 0  . ISOSORBIDE MONONITRATE ER 30 MG PO TB24 Oral Take 30 mg by mouth daily.    Marland Kitchen LEVOTHYROXINE SODIUM 88 MCG PO TABS  TAKE 1 TABLET BY MOUTH ONCE A DAY 30 tablet 5  . LISINOPRIL-HYDROCHLOROTHIAZIDE 10-12.5 MG PO TABS  TAKE 1 TABLET BY MOUTH DAILY. 30 tablet 5  . NITROGLYCERIN 0.4 MG SL SUBL Sublingual Place 1 tablet (0.4 mg total) under the tongue every 5 (five) minutes as needed. 30 tablet 5  . ONDANSETRON 8 MG PO TBDP Oral Take 8 mg by mouth every 8 (eight) hours as needed. 8mg  ODT q4 hours prn nausea    . PANTOPRAZOLE SODIUM 40 MG PO TBEC  TAKE 1 TABLET BY MOUTH DAILY 30 tablet 5  . SERTRALINE HCL 25 MG PO TABS Oral Take 25 mg by mouth daily.    . SUCRALFATE 1 GM/10ML PO SUSP Oral Take 1 g by mouth 4 (four) times daily.      BP 172/83  Pulse 78  Temp 97.8 F (36.6 C) (Oral)  Resp 18  Ht 5\' 1"  (1.549 m)  Wt 80 lb (36.288 kg)  BMI 15.12 kg/m2  SpO2 99%  Physical Exam  Nursing note  and vitals reviewed. Constitutional: She is oriented to person, place, and time. She appears well-nourished.       Frail, elderly  HENT:  Head: Normocephalic and atraumatic.  Nose: Nose normal.  Mouth/Throat: Oropharynx is clear and moist.  Eyes: Conjunctivae normal and EOM are normal. Pupils are equal, round, and reactive to light.  Neck: Normal range of motion. Neck supple. No JVD present. No tracheal deviation present. No thyromegaly present.  Cardiovascular: Normal rate, regular rhythm, normal heart sounds and intact distal pulses.  Exam reveals no gallop and no friction rub.   No murmur heard. Pulmonary/Chest: Effort normal and breath sounds normal. No stridor. No respiratory distress. She has no wheezes. She has no rales. She exhibits no tenderness.  Abdominal: Soft. Bowel sounds are normal. She exhibits no distension and no mass. There is tenderness (diffuse tenderness across  lower abdomen). There is no rebound and no guarding.  Genitourinary: Guaiac positive stool.       Hemorrhoids noted, none thrombosed or actively bleeding at this time  Musculoskeletal: Normal range of motion. She exhibits no edema and no tenderness.  Lymphadenopathy:    She has no cervical adenopathy.  Neurological: She is alert and oriented to person, place, and time. She has normal reflexes. She exhibits normal muscle tone. Coordination normal.  Skin: Skin is warm and dry. No rash noted. No erythema. No pallor.  Psychiatric: She has a normal mood and affect. Her behavior is normal. Judgment and thought content normal.    ED Course  Procedures (including critical care time)  Labs Reviewed  CBC - Abnormal; Notable for the following:    RBC 3.01 (*)     Hemoglobin 8.4 (*)     HCT 25.3 (*)     RDW 16.5 (*)     Platelets 148 (*)     All other components within normal limits  COMPREHENSIVE METABOLIC PANEL - Abnormal; Notable for the following:    Glucose, Bld 126 (*)     BUN 38 (*)     Creatinine, Ser 1.16 (*)     GFR calc non Af Amer 39 (*)     GFR calc Af Amer 46 (*)     All other components within normal limits  TYPE AND SCREEN  OCCULT BLOOD, POC DEVICE   Ct Abdomen Pelvis W Contrast  07/16/2012  *RADIOLOGY REPORT*  Clinical Data: Rectal bleeding for 3 days.  CT ABDOMEN AND PELVIS WITH CONTRAST  Technique:  Multidetector CT imaging of the abdomen and pelvis was performed following the standard protocol during bolus administration of intravenous contrast.  Contrast: 70mL OMNIPAQUE IOHEXOL 300 MG/ML  SOLN  Comparison: CT abdomen and pelvis 11/03/2009.  Findings: Lung bases are clear.  No pleural or pericardial effusion.  Cardiomegaly is noted.  0.6 cm hypoattenuating lesion in the liver on image 21 is compatible with a cyst and unchanged.  The liver is otherwise unremarkable.  The gallbladder, adrenal glands and spleen are unremarkable.  Small bilateral renal cysts are unchanged.  Mild  prominence of the pancreatic duct described on the prior study is unchanged.  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  The stomach and small bowel appear normal.  The walls of the cecum and ascending colon appear mildly thickened which could be due to underdistension or colitis.  The remainder of the colon is unremarkable.  The urinary bladder is distended. Uterus and adnexa appear normal.  No lymphadenopathy or fluid is identified.  Extensive atherosclerosis in a  nonaneurysmal aorta is noted. Bones demonstrate a mild, remote compression fracture deformity T11.  There may be old parasymphyseal pubic bone fractures.  No lytic or sclerotic lesion is identified.  IMPRESSION:  Mild thickening of the walls of the cecum and ascending colon could be due to underdistension or colitis.   Original Report Authenticated By: Bernadene Bell. D'ALESSIO, M.D.      1. Colitis       MDM  78 year old woman with lower GI bleed. Given history of presumed diverticular bleeding in the past, suspect this has returned. We'll get CT scan of abdomen and pelvis to investigate for possible abscess associated with diverticulitis        Olivia Mackie, MD 07/16/12 [id number]

## 2012-07-16 NOTE — Progress Notes (Signed)
Holding rounding note: reviewed admission and labs. Noted drop in Hgb. Complete full transfusion order set. Spoke with RN. Pt is hemodynamically stable.  Will see patient mid-day.

## 2012-07-16 NOTE — H&P (Signed)
[facility] History and Physical  Lashawna Poche RUE:[id number] DOB: [birth year]    PCP:   Illene Regulus, MD   Chief Complaint: Bright red blood per rectum.  HPI: [name] is an 78 y.o. female with hx of known diverticulosis, internal hemorroid, HTN, cardiomyopathy, Hx of CAD on medical tx, presents with several BRBPR.  She originally had some lightheadedness but no longer felt this way.  She has no SOB or CP.  Her baseline Hb is about 8.5 grams per DL.  Evaluation in the ER confirmed bleeding thru her rectum, with hemodynamic stability and Hb of 8.4.  Her CT of the abdomen showed possible colitis with evidence of diverticulosis.  She was given IV Cipro and Flagyl and hospitalist was asked to admit her for LGIB.  Her prior lower GI bleed was severe and required 8 units of PRBC.    Rewiew of Systems:  Constitutional: Negative for malaise, fever and chills. No significant weight loss or weight gain Eyes: Negative for eye pain, redness and discharge, diplopia, visual changes, or flashes of light. ENMT: Negative for ear pain, hoarseness, nasal congestion, sinus pressure and sore throat. No headaches; tinnitus, drooling, or problem swallowing. Cardiovascular: Negative for chest pain, palpitations, diaphoresis, dyspnea and peripheral edema. ; No orthopnea, PND Respiratory: Negative for cough, hemoptysis, wheezing and stridor. No pleuritic chestpain. Gastrointestinal: Negative for nausea, vomiting, diarrhea, constipation, abdominal pain, melena,  hematemesis, jaundice    Genitourinary: Negative for frequency, dysuria, incontinence,flank pain and hematuria; Musculoskeletal: Negative for back pain and neck pain. Negative for swelling and trauma.;  Skin: . Negative for pruritus, rash, abrasions, bruising and skin lesion.; ulcerations Neuro: Negative for headache, lightheadedness and neck stiffness. Negative for weakness, altered level of consciousness , altered mental status, extremity  weakness, burning feet, involuntary movement, seizure and syncope.  Psych: negative for anxiety, depression, insomnia, tearfulness, panic attacks, hallucinations, paranoia, suicidal or homicidal ideation    Past Medical History  Diagnosis Date  . Internal hemorrhoid   . Diverticulosis of colon (without mention of hemorrhage)   . Herpes zoster   . Glaucoma(365)   . Lumbar back pain   . Hypertension   . Cardiomyopathy   . Hypothyroid     Past Surgical History  Procedure Date  . Thyroidectomy   . Appendectomy   . Tubal ligation     Medications:  HOME MEDS: Prior to Admission medications   Medication Sig Start Date End Date Taking? Authorizing Provider  aspirin EC 81 MG tablet Take 81 mg by mouth daily.   Yes Historical Provider, MD  b complex vitamins capsule Take 1 capsule by mouth daily.     Yes Historical Provider, MD  fluticasone (VERAMYST) 27.5 MCG/SPRAY nasal spray Place 2 sprays into the nose daily as needed. For allergies   Yes Historical Provider, MD  HYDROcodone-acetaminophen (NORCO) 5-325 MG per tablet Take 1 tablet by mouth every 6 (six) hours as needed for pain. 07/07/12  Yes Hurman Horn, MD  isosorbide mononitrate (IMDUR) 30 MG 24 hr tablet Take 30 mg by mouth daily.   Yes Historical Provider, MD  levothyroxine (SYNTHROID, LEVOTHROID) 88 MCG tablet TAKE 1 TABLET BY MOUTH ONCE A DAY 03/05/12  Yes Jacques Navy, MD  lisinopril-hydrochlorothiazide (PRINZIDE,ZESTORETIC) 10-12.5 MG per tablet TAKE 1 TABLET BY MOUTH DAILY. 01/18/12  Yes Jacques Navy, MD  nitroGLYCERIN (NITROSTAT) 0.4 MG SL tablet Place 1 tablet (0.4 mg total) under the tongue every 5 (five) minutes as needed. 02/06/12  Yes Jacques Navy, MD  ondansetron (ZOFRAN-ODT) 8 MG disintegrating tablet Take 8 mg by mouth every 8 (eight) hours as needed. 8mg  ODT q4 hours prn nausea 07/07/12  Yes Hurman Horn, MD  pantoprazole (PROTONIX) 40 MG tablet TAKE 1 TABLET BY MOUTH DAILY 04/08/12  Yes Jacques Navy,  MD  sertraline (ZOLOFT) 25 MG tablet Take 25 mg by mouth daily.   Yes Historical Provider, MD  sucralfate (CARAFATE) 1 GM/10ML suspension Take 1 g by mouth 4 (four) times daily.   Yes Historical Provider, MD     Allergies:  No Known Allergies  Social History:   reports that she has never smoked. She has never used smokeless tobacco. She reports that she does not drink alcohol or use illicit drugs.  Family History: History reviewed. No pertinent family history.   Physical Exam: Filed Vitals:   07/16/12 0021 07/16/12 0022 07/16/12 0025 07/16/12 0254  BP: 148/70 143/80 141/65 172/83  Pulse: 80 79 81 78  Temp:    97.8 F (36.6 C)  TempSrc:    Oral  Resp:    18  Height:      Weight:      SpO2:    99%   Blood pressure 172/83, pulse 78, temperature 97.8 F (36.6 C), temperature source Oral, resp. rate 18, height 5\' 1"  (1.549 m), weight 36.288 kg (80 lb), SpO2 99.00%.  GEN:  Pleasant patient lying in the stretcher in no acute distress; cooperative with exam. PSYCH:  alert and oriented x4; does not appear anxious or depressed; affect is appropriate. HEENT: Mucous membranes pink and anicteric; PERRLA; EOM intact; no cervical lymphadenopathy nor thyromegaly or carotid bruit; no JVD; There were no stridor. Neck is very supple. Breasts:: Not examined CHEST WALL: No tenderness CHEST: Normal respiration, clear to auscultation bilaterally.  HEART: Regular rate and rhythm.  There are no murmur, rub, or gallops.   BACK: No kyphosis or scoliosis; no CVA tenderness ABDOMEN: soft and non-tender; no masses, no organomegaly, normal abdominal bowel sounds; no pannus; no intertriginous candida. There is no rebound and no distention. Rectal Exam: Not done EXTREMITIES: No bone or joint deformity; age-appropriate arthropathy of the hands and knees; no edema; no ulcerations.  There is no calf tenderness. Genitalia: not examined PULSES: 2+ and symmetric SKIN: Normal hydration no rash or  ulceration CNS: Cranial nerves 2-12 grossly intact no focal lateralizing neurologic deficit.  Speech is fluent; uvula elevated with phonation, facial symmetry and tongue midline. DTR are normal bilaterally, cerebella exam is intact, barbinski is negative and strengths are equaled bilaterally.  No sensory loss.   Labs on Admission:  Basic Metabolic Panel:  Lab 07/15/12 1610  NA 139  K 3.8  CL 102  CO2 27  GLUCOSE 126*  BUN 38*  CREATININE 1.16*  CALCIUM 9.4  MG --  PHOS --   Liver Function Tests:  Lab 07/15/12 2230  AST 29  ALT 18  ALKPHOS 73  BILITOT 0.3  PROT 6.9  ALBUMIN 3.7   No results found for this basename: LIPASE:5,AMYLASE:5 in the last 168 hours No results found for this basename: AMMONIA:5 in the last 168 hours CBC:  Lab 07/15/12 2230  WBC 4.5  NEUTROABS --  HGB 8.4*  HCT 25.3*  MCV 84.1  PLT 148*   Cardiac Enzymes: No results found for this basename: CKTOTAL:5,CKMB:5,CKMBINDEX:5,TROPONINI:5 in the last 168 hours  CBG: No results found for this basename: GLUCAP:5 in the last 168 hours   Radiological Exams on Admission: Ct Abdomen Pelvis W Contrast  07/16/2012  *RADIOLOGY REPORT*  Clinical Data: Rectal bleeding for 3 days.  CT ABDOMEN AND PELVIS WITH CONTRAST  Technique:  Multidetector CT imaging of the abdomen and pelvis was performed following the standard protocol during bolus administration of intravenous contrast.  Contrast: 70mL OMNIPAQUE IOHEXOL 300 MG/ML  SOLN  Comparison: CT abdomen and pelvis 11/03/2009.  Findings: Lung bases are clear.  No pleural or pericardial effusion.  Cardiomegaly is noted.  0.6 cm hypoattenuating lesion in the liver on image 21 is compatible with a cyst and unchanged.  The liver is otherwise unremarkable.  The gallbladder, adrenal glands and spleen are unremarkable.  Small bilateral renal cysts are unchanged.  Mild prominence of the pancreatic duct described on the prior study is unchanged.  There is a calcified splenic  artery aneurysm which is unchanged.  The stomach and small bowel appear normal.  The walls of the cecum and ascending colon appear mildly thickened which could be due to underdistension or colitis.  The remainder of the colon is unremarkable.  The urinary bladder is distended. Uterus and adnexa appear normal.  No lymphadenopathy or fluid is identified.  Extensive atherosclerosis in a nonaneurysmal aorta is noted. Bones demonstrate a mild, remote compression fracture deformity T11.  There may be old parasymphyseal pubic bone fractures.  No lytic or sclerotic lesion is identified.  IMPRESSION:  Mild thickening of the walls of the cecum and ascending colon could be due to underdistension or colitis.   Original Report Authenticated By: Bernadene Bell. Maricela Curet, M.D.      Assessment/Plan Present on Admission:  .Acute lower GI bleeding .HYPOTHYROIDISM .HYPERTENSION .CARDIOMYOPATHY .Anemia associated with acute blood loss   PLAN:  Will admit her to Dr Debby Bud' service.  I will order type and screen, but hold off on transfusion at this time.  I do suspect her Hb will drop.  Will check every 6 hours.  I will give her clear liquid in case she will need a colonoscopy.  She is stable, and will be admitted to telemetry.  Her other problems are stable, and I will continue her home meds.  Will check a TSH as she is on thyroid supplement.  I discussed code status and her wish is to be DNR.  We will honor her wish.  Other plans as per orders.  Code Status: DNR   Houston Siren, MD. [facility] Pager [phone] 7pm to 7am.  07/16/2012, 4:19 AM

## 2012-07-16 NOTE — Progress Notes (Signed)
Subjective: [name] has a history of severe diverticular bleed. She presented with recurrent hematochezia and was found in the ED to have a low hemoglobin. She reports that she has had no pain. She has continued to have hematochezia today. She denies SOB or chest pain.  Objective: Lab: Lab Results  Component Value Date   WBC 5.0 07/16/2012   HGB 7.0* 07/16/2012   HCT 21.1* 07/16/2012   MCV 84.1 07/16/2012   PLT 138* 07/16/2012   BMET    Component Value Date/Time   NA 139 07/15/2012 2230   K 3.8 07/15/2012 2230   CL 102 07/15/2012 2230   CO2 27 07/15/2012 2230   GLUCOSE 126* 07/15/2012 2230   BUN 38* 07/15/2012 2230   CREATININE 1.16* 07/15/2012 2230   CALCIUM 9.4 07/15/2012 2230   GFRNONAA 39* 07/15/2012 2230   GFRAA 46* 07/15/2012 2230     Imaging: CT abdomen/pelvis IMPRESSION:  Mild thickening of the walls of the cecum and ascending colon could  be due to underdistension or colitis.   Scheduled Meds:   . ciprofloxacin  400 mg Intravenous Once  . ciprofloxacin  400 mg Intravenous Q12H  . fluticasone  2 spray Each Nare Daily  . furosemide  20 mg Intravenous Once  . furosemide  20 mg Intravenous Once  . furosemide  20 mg Intravenous Once  . isosorbide mononitrate  30 mg Oral Daily  . levothyroxine  88 mcg Oral QAC breakfast  . metronidazole  500 mg Intravenous Once  . metronidazole  500 mg Intravenous Q8H  . pantoprazole  40 mg Oral Daily  . sertraline  25 mg Oral Daily  . sodium chloride  3 mL Intravenous Q12H  . DISCONTD: fluticasone  2 spray Nasal Daily   Continuous Infusions:   . dextrose 5 % and 0.9% NaCl 50 mL/hr at 07/16/12 0614   PRN Meds:.HYDROcodone-acetaminophen, iohexol, nitroGLYCERIN, ondansetron (ZOFRAN) IV, ondansetron   Physical Exam: Filed Vitals:   07/16/12 1205  BP: 162/68  Pulse: 63  Temp: 97.9 F (36.6 C)  Resp: 18   Very elderly AA woman in no distress. Very soft spoken c/o early satiety and mucus HEENT_ C&S clear Cor-  RRR Pulm - unlabored respirations Abd - not tender to palpation.     Assessment/Plan: 1. LGI bleed - most likely diverticular although CT suggests possible colitits. Her Hgb did drop further and she is currently receiving 2 units PRBCs  Plan - follow Hgb and transfuse as needed. Blood bank asked to keep 2 units ahead  For continued bleeding may need tagged cell scan or GI consult for colonoscopy.  2. Cardiac - hemodynamically stable.  3. HTN - mild elevation in BP but will be cautious re: treatment due to bleed and risk for hypotension.   Illene Regulus [place] [facility] (o) [phone]; (c) [phone] Call-grp - Patsi [facility] Tele: [phone]  07/16/2012, 12:48 PM

## 2012-07-17 DIAGNOSIS — K5730 Diverticulosis of large intestine without perforation or abscess without bleeding: Secondary | ICD-10-CM

## 2012-07-17 LAB — TYPE AND SCREEN
ABO/RH(D): O POS
Unit division: 0

## 2012-07-17 LAB — HEMOGLOBIN AND HEMATOCRIT, BLOOD
HCT: 33.5 % — ABNORMAL LOW (ref 36.0–46.0)
Hemoglobin: 11.3 g/dL — ABNORMAL LOW (ref 12.0–15.0)

## 2012-07-17 NOTE — Consult Note (Signed)
Referring Provider: No ref. provider found Primary Care Physician:  Illene Regulus, MD Primary Gastroenterologist:  [facility]  Reason for Consultation:  LGIB  HPI: [name] is a 78 y.o. female who began having rectal bleeding on Sunday.  This occurred with BM's only, but increased in amounts over the next day.  Called Dr. Debby Bud office and referred her to the hospital.  She does have a history of severe diverticular bleed in the past, requiring 8 units of blood at that time.  When she came to the hospital on this occasion a CT scan possible thickening of the cecum and ascending colon vs underdistention.  Her baseline Hgb is in the 8-9 gram range, which is where her Hgb was initially on admission, but then subsequently dropped to 7.0 grams and she was given one unit of PRBC's with great response to 11.0 grams.  Hgb is stable today at 11.3 grams.  Patient continued to have rectal bleeding overnight on two occasions.  Tech said that it was large amounts, but it did occur on the bedpan and may have been mixed with urine as well.  She denies any abdominal pain, nausea, or vomiting.  No diarrhea.  Says that she occasionally has constipation, but not recently.  Last colonoscopy was in 2003 at which time she had diverticulosis and hemorrhoids.   Past Medical History  Diagnosis Date  . Internal hemorrhoid   . Diverticulosis of colon (without mention of hemorrhage)   . Herpes zoster   . Glaucoma(365)   . Lumbar back pain   . Hypertension   . Cardiomyopathy   . Hypothyroid     Past Surgical History  Procedure Date  . Thyroidectomy   . Appendectomy   . Tubal ligation     Prior to Admission medications   Medication Sig Start Date End Date Taking? Authorizing Provider  aspirin EC 81 MG tablet Take 81 mg by mouth daily.   Yes Historical Provider, MD  b complex vitamins capsule Take 1 capsule by mouth daily.     Yes Historical Provider, MD  fluticasone (VERAMYST) 27.5 MCG/SPRAY nasal spray  Place 2 sprays into the nose daily as needed. For allergies   Yes Historical Provider, MD  HYDROcodone-acetaminophen (NORCO) 5-325 MG per tablet Take 1 tablet by mouth every 6 (six) hours as needed for pain. 07/07/12  Yes Hurman Horn, MD  isosorbide mononitrate (IMDUR) 30 MG 24 hr tablet Take 30 mg by mouth daily.   Yes Historical Provider, MD  levothyroxine (SYNTHROID, LEVOTHROID) 88 MCG tablet TAKE 1 TABLET BY MOUTH ONCE A DAY 03/05/12  Yes Jacques Navy, MD  lisinopril-hydrochlorothiazide (PRINZIDE,ZESTORETIC) 10-12.5 MG per tablet TAKE 1 TABLET BY MOUTH DAILY. 01/18/12  Yes Jacques Navy, MD  nitroGLYCERIN (NITROSTAT) 0.4 MG SL tablet Place 1 tablet (0.4 mg total) under the tongue every 5 (five) minutes as needed. 02/06/12  Yes Jacques Navy, MD  ondansetron (ZOFRAN-ODT) 8 MG disintegrating tablet Take 8 mg by mouth every 8 (eight) hours as needed. 8mg  ODT q4 hours prn nausea 07/07/12  Yes Hurman Horn, MD  pantoprazole (PROTONIX) 40 MG tablet TAKE 1 TABLET BY MOUTH DAILY 04/08/12  Yes Jacques Navy, MD  sertraline (ZOLOFT) 25 MG tablet Take 25 mg by mouth daily.   Yes Historical Provider, MD  sucralfate (CARAFATE) 1 GM/10ML suspension Take 1 g by mouth 4 (four) times daily.   Yes Historical Provider, MD    Current Facility-Administered Medications  Medication Dose Route Frequency Provider Last  Rate Last Dose  . ciprofloxacin (CIPRO) IVPB 400 mg  400 mg Intravenous Q12H Houston Siren, MD   400 mg at 07/17/12 0430  . dextrose 5 %-0.9 % sodium chloride infusion   Intravenous Continuous Houston Siren, MD 50 mL/hr at 07/17/12 0431    . fluticasone (FLONASE) 50 MCG/ACT nasal spray 2 spray  2 spray Each Nare Daily Jacques Navy, MD   2 spray at 07/16/12 1117  . furosemide (LASIX) injection 20 mg  20 mg Intravenous Once Katy Apo, MD   20 mg at 07/16/12 1511  . furosemide (LASIX) injection 20 mg  20 mg Intravenous Once Katy Apo, MD      . furosemide (LASIX) injection 20 mg  20 mg  Intravenous Once Jacques Navy, MD   20 mg at 07/16/12 1942  . HYDROcodone-acetaminophen (NORCO/VICODIN) 5-325 MG per tablet 1 tablet  1 tablet Oral Q4H PRN Houston Siren, MD      . isosorbide mononitrate (IMDUR) 24 hr tablet 30 mg  30 mg Oral Daily Houston Siren, MD   30 mg at 07/16/12 1116  . levothyroxine (SYNTHROID, LEVOTHROID) tablet 88 mcg  88 mcg Oral QAC breakfast Houston Siren, MD   88 mcg at 07/16/12 0800  . metroNIDAZOLE (FLAGYL) IVPB 500 mg  500 mg Intravenous Q8H Houston Siren, MD   500 mg at 07/17/12 0300  . nitroGLYCERIN (NITROSTAT) SL tablet 0.4 mg  0.4 mg Sublingual Q5 min PRN Houston Siren, MD      . ondansetron [facility]) tablet 4 mg  4 mg Oral Q6H PRN Houston Siren, MD       Or  . ondansetron [facility]) injection 4 mg  4 mg Intravenous Q6H PRN Houston Siren, MD      . pantoprazole (PROTONIX) EC tablet 40 mg  40 mg Oral Daily Jacques Navy, MD   40 mg at 07/16/12 1116  . sertraline (ZOLOFT) tablet 25 mg  25 mg Oral Daily Houston Siren, MD   25 mg at 07/16/12 1116  . sodium chloride 0.9 % injection 3 mL  3 mL Intravenous Q12H Houston Siren, MD   3 mL at 07/16/12 2113    Allergies as of 07/15/2012  . (No Known Allergies)    History reviewed. No pertinent family history.  History   Social History  . Marital Status: Widowed    Spouse Name: N/A    Number of Children: N/A  . Years of Education: N/A   Occupational History  . Not on file.   Social History Main Topics  . Smoking status: Never Smoker   . Smokeless tobacco: Never Used  . Alcohol Use: No  . Drug Use: No  . Sexually Active: Not Currently   Other Topics Concern  . Not on file   Social History Narrative   Lives alone but family stay with her at night. She remains independent. She has a DNR established but does want medical care.    Review of Systems: Ten point ROS is O/W negative except as mentioned in HPI.  Physical Exam: Vital signs in last 24 hours: Temp:  [97.2 F (36.2 C)-98.7 F (37.1 C)] 98.7 F (37.1 C) (10/30 0559) Pulse  Rate:  [52-97] 61  (10/30 0559) Resp:  [16-22] 18  (10/30 0559) BP: (127-177)/(57-78) 144/69 mmHg (10/30 0559) SpO2:  [98 %-100 %] 100 % (10/30 0559) Last BM Date: 07/16/12 General:   Alert, thin, pleasant and cooperative in NAD. Head:  Normocephalic and atraumatic. Eyes:  Sclera clear,  no icterus.  Conjunctiva pink. Ears:  Normal auditory acuity. Mouth:  No deformity or lesions.   Lungs:  Clear throughout to auscultation.  No wheezes, crackles, or rhonchi.  Heart:  Regular rate and rhythm; no murmurs, clicks, rubs,  or gallops. Abdomen:  Soft, nontender, BS active, nonpalp mass or hsm.   Rectal:  Deferred.  Msk:  Symmetrical without gross deformities. Pulses:  Normal pulses noted. Extremities:  Without clubbing or edema. Neurologic:  Alert and  oriented x4;  grossly normal neurologically. Skin:  Intact without significant lesions or rashes.. Psych:  Alert and cooperative. Normal mood and affect.  Intake/Output from previous day: 10/29 0701 - 10/30 0700 In: 1460 [P.O.:360; I.V.:400; Blood:700] Out: 2050 [Urine:650; Stool:1400]  Lab Results:  [facility] 07/17/12 0915 07/16/12 2059 07/16/12 0615 07/15/12 2230  WBC -- 6.0 5.0 4.5  HGB 11.3* 11.0* 7.0* --  HCT 33.5* 33.0* 21.1* --  PLT -- 118* 138* 148*   BMET  Basename 07/15/12 2230  NA 139  K 3.8  CL 102  CO2 27  GLUCOSE 126*  BUN 38*  CREATININE 1.16*  CALCIUM 9.4   LFT  Basename 07/15/12 2230  PROT 6.9  ALBUMIN 3.7  AST 29  ALT 18  ALKPHOS 73  BILITOT 0.3  BILIDIR --  IBILI --   Studies/Results: Ct Abdomen Pelvis W Contrast  07/16/2012  *RADIOLOGY REPORT*  Clinical Data: Rectal bleeding for 3 days.  CT ABDOMEN AND PELVIS WITH CONTRAST  Technique:  Multidetector CT imaging of the abdomen and pelvis was performed following the standard protocol during bolus administration of intravenous contrast.  Contrast: 70mL OMNIPAQUE IOHEXOL 300 MG/ML  SOLN  Comparison: CT abdomen and pelvis 11/03/2009.  Findings: Lung  bases are clear.  No pleural or pericardial effusion.  Cardiomegaly is noted.  0.6 cm hypoattenuating lesion in the liver on image 21 is compatible with a cyst and unchanged.  The liver is otherwise unremarkable.  The gallbladder, adrenal glands and spleen are unremarkable.  Small bilateral renal cysts are unchanged.  Mild prominence of the pancreatic duct described on the prior study is unchanged.  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  The stomach and small bowel appear normal.  The walls of the cecum and ascending colon appear mildly thickened which could be due to underdistension or colitis.  The remainder of the colon is unremarkable.  The urinary bladder is distended. Uterus and adnexa appear normal.  No lymphadenopathy or fluid is identified.  Extensive atherosclerosis in a nonaneurysmal aorta is noted. Bones demonstrate a mild, remote compression fracture deformity T11.  There may be old parasymphyseal pubic bone fractures.  No lytic or sclerotic lesion is identified.  IMPRESSION:  Mild thickening of the walls of the cecum and ascending colon could be due to underdistension or colitis.   Original Report Authenticated By: Bernadene Bell. Maricela Curet, M.D.     IMPRESSION:  -LGIB:  ? Diverticular vs colitis (possible thickening of cecum and ascending colon seen on CT scan). -History of diverticular bleed -Acute on chronic anemia:  Responded well to one unit of PRBC's yesterday and Hgb is stable this AM.  PLAN: -Monitor Hgb. -Will discuss with Dr. Russella Dar regarding further evaluation and treatment plan, ie colonoscopy, etc.   ZEHR, JESSICA D.  07/17/2012, 10:04 AM  Pager number [phone]    I have taken a history, examined the patient and reviewed the chart. I agree with Otho Darner note, impression and recommendations. Acute GI bleed, probably LGI, with a  history of diverticular bleeding. Nonspecific cecum and ascending colon wall thickening on CT scan. Last colonoscopy  that we can locate was done in 2003 showing diverticulosis and hemorrhoids. Recommended colonoscopy and possible EGD to further evaluate. Pt declines at this time but will discuss further with her daughter later today or tonight. Monitor Hb/Hct.  Meryl Dare MD [facility]

## 2012-07-17 NOTE — Progress Notes (Signed)
Subjective: Per report she contniued to have hematochezia through the night. She has no chest pain no chortness of breath  Objective: Lab: Lab Results  Component Value Date   WBC 6.0 07/16/2012   HGB 11.0* 07/16/2012   HCT 33.0* 07/16/2012   MCV 82.9 07/16/2012   PLT 118* 07/16/2012   BMET    Component Value Date/Time   NA 139 07/15/2012 2230   K 3.8 07/15/2012 2230   CL 102 07/15/2012 2230   CO2 27 07/15/2012 2230   GLUCOSE 126* 07/15/2012 2230   BUN 38* 07/15/2012 2230   CREATININE 1.16* 07/15/2012 2230   CALCIUM 9.4 07/15/2012 2230   GFRNONAA 39* 07/15/2012 2230   GFRAA 46* 07/15/2012 2230     Imaging:  Scheduled Meds:   . ciprofloxacin  400 mg Intravenous Q12H  . fluticasone  2 spray Each Nare Daily  . furosemide  20 mg Intravenous Once  . furosemide  20 mg Intravenous Once  . furosemide  20 mg Intravenous Once  . isosorbide mononitrate  30 mg Oral Daily  . levothyroxine  88 mcg Oral QAC breakfast  . metronidazole  500 mg Intravenous Q8H  . pantoprazole  40 mg Oral Daily  . sertraline  25 mg Oral Daily  . sodium chloride  3 mL Intravenous Q12H   Continuous Infusions:   . dextrose 5 % and 0.9% NaCl 50 mL/hr at 07/17/12 0431   PRN Meds:.HYDROcodone-acetaminophen, nitroGLYCERIN, ondansetron (ZOFRAN) IV, ondansetron   Physical Exam: Filed Vitals:   07/17/12 0559  BP: 144/69  Pulse: 61  Temp: 98.7 F (37.1 C)  Resp: 18   Thin and frail elderly AA woman in no distress Cor- RRR w/o tachycardia Pulm - normal respirations Abd - soft, non-tender     Assessment/Plan: 1. LGI bleed - per report still bleeding. Had a very good response to 2 u PRBCs Hgb 7.0 to 11.0  Plan - H/H - will transfuse for Hgb 8 or less  GI consult   Illene Regulus [place] [facility] (o) [phone]; (c) [phone] Call-grp - Patsi Sears IM Tele: [phone]  07/17/2012, 8:41 AM

## 2012-07-18 ENCOUNTER — Telehealth: Payer: Self-pay | Admitting: Internal Medicine

## 2012-07-18 LAB — HEMOGLOBIN AND HEMATOCRIT, BLOOD
HCT: 31.8 % — ABNORMAL LOW (ref 36.0–46.0)
Hemoglobin: 10.3 g/dL — ABNORMAL LOW (ref 12.0–15.0)

## 2012-07-18 NOTE — Progress Notes (Signed)
PM note:  Per RN patient had two bloody stools during the day - approx 400cc of stool and blood. AM Hgb 10.8. PM Hgb pending.  Spoke with daughter and explained that colonoscopy is not particularly stressfull and may be helpful in identifying a bleeding site.  Plan - continue q12 H/H - changed time to 0600, 1800 hrs.

## 2012-07-18 NOTE — Progress Notes (Signed)
Subjective: [name] is a little confused this morning. She does report scant amount of blood per rectum w/ BM but no large bleed. In discussing GI recommendations for endoscopy she wants her family consulted before making a decision. Fortunately she is in no distress.  Objective: Lab: Lab Results  Component Value Date   WBC 6.0 07/16/2012   HGB 10.8* 07/17/2012   HCT 31.8* 07/17/2012   MCV 82.9 07/16/2012   PLT 118* 07/16/2012   BMET    Component Value Date/Time   NA 139 07/15/2012 2230   K 3.8 07/15/2012 2230   CL 102 07/15/2012 2230   CO2 27 07/15/2012 2230   GLUCOSE 126* 07/15/2012 2230   BUN 38* 07/15/2012 2230   CREATININE 1.16* 07/15/2012 2230   CALCIUM 9.4 07/15/2012 2230   GFRNONAA 39* 07/15/2012 2230   GFRAA 46* 07/15/2012 2230     Imaging: no new  imaging  Scheduled Meds:   . ciprofloxacin  400 mg Intravenous Q12H  . fluticasone  2 spray Each Nare Daily  . furosemide  20 mg Intravenous Once  . isosorbide mononitrate  30 mg Oral Daily  . levothyroxine  88 mcg Oral QAC breakfast  . metronidazole  500 mg Intravenous Q8H  . pantoprazole  40 mg Oral Daily  . sertraline  25 mg Oral Daily  . sodium chloride  3 mL Intravenous Q12H   Continuous Infusions:   . dextrose 5 % and 0.9% NaCl 50 mL/hr at 07/18/12 0613   PRN Meds:.HYDROcodone-acetaminophen, nitroGLYCERIN, ondansetron (ZOFRAN) IV, ondansetron   Physical Exam: Filed Vitals:   07/18/12 0648  BP: 177/97  Pulse: 79  Temp: 98.9 F (37.2 C)  Resp: 18  gen'l - very elderly and frail woman in no distress HEENT- arcus senilis, muddy Conjuntiva Cor- RRR Pulm - normal respirations Abd - BS+, soft, no guarding or rebound      Assessment/Plan: 1. GI - LGI bleed seems to have slowed down. H/H for AM pending.  Plan Monitory H/H  If she has slowed or stopped bleeding may not need endoscopy - offered to meet with her family to night  Will allow full liquid diet since it is logistically unlikely any  procedures can be arranged today.   Illene Regulus [place] [facility] (o) [phone]; (c) [phone] Call-grp - [name] IM Tele: [phone]  07/18/2012, 8:24 AM

## 2012-07-18 NOTE — Telephone Encounter (Signed)
The patient's daughter called and is hoping to speak with you about her Mom's hospital stay.  She apologized for missing your call this am, and is hoping you can speak with her sometime after 2:30pm (she is off work then) Her call back is [phone]

## 2012-07-18 NOTE — Progress Notes (Signed)
 Gastroenterology Progress Note  Subjective:  Feels about the same.  Had one BM so far today with a moderate amount of blood per nursing tech report.  No abdominal pain.  Objective:  Vital signs in last 24 hours: Temp:  [98 F (36.7 C)-98.9 F (37.2 C)] 98.9 F (37.2 C) (10/31 0648) Pulse Rate:  [60-79] 79  (10/31 0648) Resp:  [16-18] 18  (10/31 0648) BP: (128-177)/(69-97) 177/97 mmHg (10/31 0648) SpO2:  [98 %-99 %] 99 % (10/31 0648) Last BM Date: 07/17/12 General:   Alert, thin, in NAD. Heart:  Regular rate and rhythm; no murmurs Pulm:  CTAB.  No W/R/R. Abdomen:  Soft, nontender and nondistended.  Hyperactive BS's. Extremities:  Without edema. Neurologic:  Alert and  oriented x4;  grossly normal neurologically. Psych:  Alert and cooperative. Normal mood and affect.  Intake/Output from previous day: 10/30 0701 - 10/31 0700 In: 3170.8 [P.O.:60; I.V.:1710.8; IV Piggyback:1400] Out: 600 [Urine:550; Stool:50]  Lab Results:  Basename 07/18/12 0858 07/17/12 1657 07/17/12 0915 07/16/12 2059 07/16/12 0615 07/15/12 2230  WBC -- -- -- 6.0 5.0 4.5  HGB 10.3* 10.8* 11.3* -- -- --  HCT 31.4* 31.8* 33.5* -- -- --  PLT -- -- -- 118* 138* 148*   BMET  Basename 07/15/12 2230  NA 139  K 3.8  CL 102  CO2 27  GLUCOSE 126*  BUN 38*  CREATININE 1.16*  CALCIUM 9.4   LFT  Basename 07/15/12 2230  PROT 6.9  ALBUMIN 3.7  AST 29  ALT 18  ALKPHOS 73  BILITOT 0.3  BILIDIR --  IBILI --   Assessment / Plan: -LGIB: ? Diverticular vs colitis (possible thickening of cecum and ascending colon seen on CT scan).  -History of diverticular bleed  -Acute on chronic anemia: Responded well to one unit of PRBC's; Hgb is down one gram from yesterday AM.  *Monitor Hgb and transfuse prn. *Dr. Debby Bud is going to discuss whether or not patient will have colonoscopy with the family tonight.    LOS: 3 days   ZEHR, JESSICA D.  07/18/2012, 10:41 AM  Pager number [phone]    I have taken  an interval history, reviewed the chart and examined the patient. I agree with Otho Darner note, impression and recommendations. We can proceed with colonoscopy at some point to further evaluate her source of bleeding and CT abnormalities if she is agreeable however at this time she declines.    Venita Lick. Russella Dar MD [facility]

## 2012-07-19 ENCOUNTER — Encounter (HOSPITAL_COMMUNITY): Payer: Self-pay | Admitting: *Deleted

## 2012-07-19 ENCOUNTER — Encounter (HOSPITAL_COMMUNITY)
Admission: EM | Disposition: A | Discharge status: Home Health | Payer: Self-pay | Source: Home / Self Care | Attending: Internal Medicine

## 2012-07-19 DIAGNOSIS — E46 Unspecified protein-calorie malnutrition: Secondary | ICD-10-CM

## 2012-07-19 DIAGNOSIS — E871 Hypo-osmolality and hyponatremia: Secondary | ICD-10-CM

## 2012-07-19 DIAGNOSIS — K648 Other hemorrhoids: Secondary | ICD-10-CM

## 2012-07-19 DIAGNOSIS — D126 Benign neoplasm of colon, unspecified: Secondary | ICD-10-CM

## 2012-07-19 DIAGNOSIS — K5730 Diverticulosis of large intestine without perforation or abscess without bleeding: Secondary | ICD-10-CM

## 2012-07-19 DIAGNOSIS — K225 Diverticulum of esophagus, acquired: Secondary | ICD-10-CM

## 2012-07-19 HISTORY — DX: Hypo-osmolality and hyponatremia: E87.1

## 2012-07-19 HISTORY — DX: Other hemorrhoids: K64.8

## 2012-07-19 HISTORY — PX: COLONOSCOPY: SHX5424

## 2012-07-19 HISTORY — DX: Diverticulum of esophagus, acquired: K22.5

## 2012-07-19 HISTORY — DX: Diverticulosis of large intestine without perforation or abscess without bleeding: K57.30

## 2012-07-19 HISTORY — DX: Unspecified protein-calorie malnutrition: E46

## 2012-07-19 LAB — HEMOGLOBIN AND HEMATOCRIT, BLOOD
HCT: 33.9 % — ABNORMAL LOW (ref 36.0–46.0)
HCT: 30.7 % — ABNORMAL LOW (ref 36.0–46.0)
Hemoglobin: 11.2 g/dL — ABNORMAL LOW (ref 12.0–15.0)

## 2012-07-19 SURGERY — COLONOSCOPY
Anesthesia: Moderate Sedation

## 2012-07-19 MED ORDER — SODIUM CHLORIDE 0.9 % IV SOLN
INTRAVENOUS | Status: DC
  Administered 2012-07-19: 19:00:00 via INTRAVENOUS

## 2012-07-19 MED ORDER — FENTANYL CITRATE 0.05 MG/ML IJ SOLN
INTRAMUSCULAR | Status: AC
  Filled 2012-07-19: qty 2

## 2012-07-19 MED ORDER — PEG-KCL-NACL-NASULF-NA ASC-C 100 G PO SOLR
1.0000 | Freq: Once | ORAL | Status: DC
  Filled 2012-07-19: qty 1

## 2012-07-19 MED ORDER — MIDAZOLAM HCL 10 MG/2ML IJ SOLN
INTRAMUSCULAR | Status: AC
  Filled 2012-07-19: qty 2

## 2012-07-19 MED ORDER — FENTANYL CITRATE 0.05 MG/ML IJ SOLN
INTRAMUSCULAR | Status: DC | PRN
  Administered 2012-07-19: 25 ug via INTRAVENOUS
  Administered 2012-07-19: 12.5 ug via INTRAVENOUS

## 2012-07-19 MED ORDER — MIDAZOLAM HCL 5 MG/5ML IJ SOLN
INTRAMUSCULAR | Status: DC | PRN
  Administered 2012-07-19 (×2): 1 mg via INTRAVENOUS

## 2012-07-19 NOTE — Interval H&P Note (Signed)
History and Physical Interval Note:  07/19/2012 4:19 PM  [name]  has presented today for surgery, with the diagnosis of LGIB  The various methods of treatment have been discussed with the patient and family. After consideration of risks, benefits and other options for treatment, the patient has consented to  Procedure(s) (LRB) with comments: COLONOSCOPY (N/A) as a surgical intervention .  The patient's history has been reviewed, patient examined, no change in status, stable for surgery.  I have reviewed the patient's chart and labs.  Questions were answered to the patient's satisfaction.     Venita Lick. Russella Dar MD [facility]

## 2012-07-19 NOTE — Progress Notes (Signed)
Subjective: [name] thinks she may have had another bloody stool last night but isn't sure. No pain or discomfort   Objective: Lab: Lab Results  Component Value Date   WBC 6.0 07/16/2012   HGB 10.6* 07/18/2012   HCT 31.8* 07/18/2012   MCV 82.9 07/16/2012   PLT 118* 07/16/2012   BMET    Component Value Date/Time   NA 139 07/15/2012 2230   K 3.8 07/15/2012 2230   CL 102 07/15/2012 2230   CO2 27 07/15/2012 2230   GLUCOSE 126* 07/15/2012 2230   BUN 38* 07/15/2012 2230   CREATININE 1.16* 07/15/2012 2230   CALCIUM 9.4 07/15/2012 2230   GFRNONAA 39* 07/15/2012 2230   GFRAA 46* 07/15/2012 2230     Imaging:  Scheduled Meds:   . ciprofloxacin  400 mg Intravenous Q12H  . fluticasone  2 spray Each Nare Daily  . furosemide  20 mg Intravenous Once  . isosorbide mononitrate  30 mg Oral Daily  . levothyroxine  88 mcg Oral QAC breakfast  . metronidazole  500 mg Intravenous Q8H  . pantoprazole  40 mg Oral Daily  . peg 3350 powder  1 kit Oral Once  . sertraline  25 mg Oral Daily  . sodium chloride  3 mL Intravenous Q12H   Continuous Infusions:   . dextrose 5 % and 0.9% NaCl 50 mL/hr at 07/18/12 0613   PRN Meds:.HYDROcodone-acetaminophen, nitroGLYCERIN, ondansetron (ZOFRAN) IV, ondansetron   Physical Exam: Filed Vitals:   07/19/12 0600  BP: 169/78  Pulse: 60  Temp: 98.2 F (36.8 C)  Resp: 18   gen'l- El;derly, frail, no distress Cor- RRR Abd- soft     Assessment/Plan: 1. LGI bleed - Hgb for AM is pending. She has continued to have bloody stools.  Plan H/H pending  For Colonoscopy this PM   Illene Regulus [place] [facility] (o) [phone]; (c) [phone] Call-grp - Patsi Sears IM Tele: [phone]  07/19/2012, 10:26 AM

## 2012-07-19 NOTE — Progress Notes (Signed)
[facility] Progress Note  Subjective:  Patient with two bloody stools during the day yesterday.  Dr. Debby Bud spoke with family about colonoscopy last night.  Patient is agreeable to proceed.  Objective:  Vital signs in last 24 hours: Temp:  [98.1 F (36.7 C)-98.2 F (36.8 C)] 98.2 F (36.8 C) (11/01 0600) Pulse Rate:  [60-72] 60  (11/01 0600) Resp:  [18] 18  (11/01 0600) BP: (169-190)/(78-80) 169/78 mmHg (11/01 0600) SpO2:  [95 %-100 %] 95 % (11/01 0600) Last BM Date: 07/18/12 General:   Alert, thin and frail, in NAD Heart:  Regular rate and rhythm; no murmurs Pulm:  CTAB.  No W/R/R. Abdomen:  Soft, nontender and nondistended. Normal bowel sounds.   Extremities:  Without edema. Neurologic:  Alert and  oriented x4;  grossly normal neurologically. Psych:  Alert and cooperative. Normal mood and affect.  Intake/Output from previous day: 10/31 0701 - 11/01 0700 In: 60 [P.O.:60] Out: 1150 [Stool:1150] Intake/Output this shift:    Lab Results:  Basename 07/18/12 1821 07/18/12 0858 07/17/12 1657 07/16/12 2059  WBC -- -- -- 6.0  HGB 10.6* 10.3* 10.8* --  HCT 31.8* 31.4* 31.8* --  PLT -- -- -- 118*   Assessment / Plan: -LGIB: ? Diverticular vs colitis (possible thickening of cecum and ascending colon seen on CT scan).  -History of diverticular bleed  -Acute on chronic anemia: Responded well to one unit of PRBC's; Hgb is stable this AM.   *Monitor Hgb and transfuse prn.  *Colonoscopy later today.    LOS: 4 days   ZEHR, JESSICA D.  07/19/2012, 9:09 AM  Pager number [phone]    I have taken an interval history, reviewed the chart and examined the patient. I agree with the extender's note, impression and recommendations. Bowel prep and then colonoscopy later today.  Venita Lick. Russella Dar MD [facility]

## 2012-07-19 NOTE — Progress Notes (Signed)
INITIAL ADULT NUTRITION ASSESSMENT Date: 07/19/2012   Time: 3:40 PM Reason for Assessment: Low BMI  INTERVENTION: Diet advancement per MD. Recommend Boost Plus BID once diet advanced. Pt at high risk for developing pressure ulcers r/t pt being underweight with severe chronic malnutrition. Recommend nursing re-weigh pt as pt's weight was documented as 80 pounds on 10/28, then 94 pounds on 10/29. Will monitor.   Pt meets criteria for severe malnutrition of chronic illness AEB pt with severe muscle wasting and subcutaneous fat loss in clavicles and upper extremities with fluid accumulation of non-pitting RLE and LLE edema.   ASSESSMENT: Female 78 y.o.  Dx: Bright red blood per rectum  Food/Nutrition Related Hx: Pt reports eating 3 meals/day PTA, not on any nutritional supplements. Pt drinks Boost off/on. Pt denies any problems chewing, however states sometimes she has difficulty swallowing food and points to her esophagus as where the food gets stuck, however reports when this occurs, she eats more food and it goes away. Family reports pt's weight has been up and down PTA. Pt admitted with rectal bleeding that started Saturday PTA. Pt scheduled to have colonoscopy later on today.    Hx:  Past Medical History  Diagnosis Date  . Internal hemorrhoid   . Diverticulosis of colon (without mention of hemorrhage)   . Herpes zoster   . Glaucoma(365)   . Lumbar back pain   . Hypertension   . Cardiomyopathy   . Hypothyroid    Related Meds: Scheduled Meds:   . ciprofloxacin  400 mg Intravenous Q12H  . fluticasone  2 spray Each Nare Daily  . furosemide  20 mg Intravenous Once  . isosorbide mononitrate  30 mg Oral Daily  . levothyroxine  88 mcg Oral QAC breakfast  . metronidazole  500 mg Intravenous Q8H  . pantoprazole  40 mg Oral Daily  . peg 3350 powder  1 kit Oral Once  . sertraline  25 mg Oral Daily  . sodium chloride  3 mL Intravenous Q12H   Continuous Infusions:   . sodium chloride     . dextrose 5 % and 0.9% NaCl 50 mL/hr at 07/19/12 1116   PRN Meds:.HYDROcodone-acetaminophen, nitroGLYCERIN, ondansetron (ZOFRAN) IV, ondansetron  Ht: 5\' 1"  (154.9 cm)  Wt: 94 lb (42.638 kg) Non-pitting RLE and LLE edema  Ideal Wt: 105 lb % Ideal Wt: 89  Usual Wt: 86-94 lb in the past year % Usual Wt: 100-109  Wt Readings from Last 10 Encounters:  07/16/12 94 lb (42.638 kg)  07/16/12 94 lb (42.638 kg)  03/13/12 86 lb (39.009 kg)  12/12/11 87 lb (39.463 kg)  08/21/11 85 lb (38.556 kg)  06/27/11 87 lb (39.463 kg)  06/01/11 85 lb (38.556 kg)  08/03/10 86 lb (39.009 kg)  12/24/07 94 lb (42.638 kg)  10/17/06 104 lb (47.174 kg)    Body mass index is 17.76 kg/(m^2).  Labs:  CMP     Component Value Date/Time   NA 139 07/15/2012 2230   K 3.8 07/15/2012 2230   CL 102 07/15/2012 2230   CO2 27 07/15/2012 2230   GLUCOSE 126* 07/15/2012 2230   BUN 38* 07/15/2012 2230   CREATININE 1.16* 07/15/2012 2230   CALCIUM 9.4 07/15/2012 2230   PROT 6.9 07/15/2012 2230   ALBUMIN 3.7 07/15/2012 2230   AST 29 07/15/2012 2230   ALT 18 07/15/2012 2230   ALKPHOS 73 07/15/2012 2230   BILITOT 0.3 07/15/2012 2230   GFRNONAA 39* 07/15/2012 2230   GFRAA 46* 07/15/2012 2230  Intake/Output Summary (Last 24 hours) at 07/19/12 1545 Last data filed at 07/19/12 1056  Gross per 24 hour  Intake    120 ml  Output    550 ml  Net   -430 ml   Last BM - 10/31, diarrhea  Diet Order: NPO   IVF:    sodium chloride   dextrose 5 % and 0.9% NaCl Last Rate: 50 mL/hr at 07/19/12 1116    Estimated Nutritional Needs:   Kcal:1300-1500 Protein:65-75g Fluid:1.3-1.5L  NUTRITION DIAGNOSIS: -Inadequate oral intake (NI-2.1).  Status: Ongoing  RELATED TO: inability to eat  AS EVIDENCE BY: NPO  MONITORING/EVALUATION(Goals): Advance diet as tolerated to regular diet.   EDUCATION NEEDS: -No education needs identified at this time   Dietitian #: [phone]  DOCUMENTATION CODES Per approved  criteria  -Severe malnutrition in the context of chronic illness -Underweight    Marshall Cork 07/19/2012, 3:40 PM

## 2012-07-20 LAB — HEMOGLOBIN AND HEMATOCRIT, BLOOD
HCT: 30.7 % — ABNORMAL LOW (ref 36.0–46.0)
Hemoglobin: 10.1 g/dL — ABNORMAL LOW (ref 12.0–15.0)

## 2012-07-20 MED ORDER — MESALAMINE 1000 MG RE SUPP
1000.0000 mg | Freq: Two times a day (BID) | RECTAL | Status: DC
  Administered 2012-07-20 – 2012-07-23 (×6): 1000 mg via RECTAL
  Filled 2012-07-20 (×9): qty 1

## 2012-07-20 NOTE — Progress Notes (Signed)
Patient ID: [name], female   DOB: [birth year], 79 y.o.   MRN: [id number]  [name]  Subjective: Denies any abdominal pain or cramping-no further bleeding . HGB 10.1 stable  Objective:  Vital signs in last 24 hours: Temp:  [97.5 F (36.4 C)-98.4 F (36.9 C)] 98 F (36.7 C) (11/01 2144) Pulse Rate:  [44-80] 64  (11/01 2144) Resp:  [9-33] 16  (11/01 2144) BP: (66-185)/(22-100) 132/73 mmHg (11/01 2144) SpO2:  [92 %-100 %] 97 % (11/01 2144) Last BM Date: 07/19/12 General:   Alert,  Well-developed,frail elderly female,    in NAD Heart:  Regular rate and rhythm; no murmurs Pulm;clear ant Abdomen:  Soft, nontender and nondistended. Normal bowel sounds, without guarding, and without rebound.   Extremities:  Without edema. Neurologic:  Alert and  oriented x4;  grossly normal neurologically. Psych:  Alert and cooperative. Normal mood and affect.  Intake/Output from previous day: 11/01 0701 - 11/02 0700 In: 120 [P.O.:120] Out: 200 [Urine:200] Intake/Output this shift:    Lab Results:  Basename 07/20/12 0533 07/19/12 1958 07/19/12 1100  WBC -- -- --  HGB 10.1* 10.0* 11.2*  HCT 30.7* 30.7* 33.9*  PLT -- -- --    Assessment / Plan: #1  78 yo female with persistent lower GI bleeding - stable since colonoscopy yesterday - no further active bleeding. Colonoscopy revealed a rectal ulcer, diverticulosis, and a sessile polyp in the cecum which was removed. Etiology of bleeding either diverticular or secondary to probable stercoral rectal ulcer.  Will advance diet, add  canasa supp twice daily  #2 Anemia- secondary to acute blood loss #3 Cardiomyopathy  Active Problems:  HYPOTHYROIDISM  HYPERTENSION  CARDIOMYOPATHY  Anemia associated with acute blood loss  Acute lower GI bleeding  Benign neoplasm of colon   LOS: 5 days   [name]  07/20/2012, 8:49 AM    I have taken an interval history, reviewed the chart and examined the patient. I agree  with the extender's [name], impression and recommendations. Bleeding has resolved. No GI indication for antibiotics-can they be stopped? GI signing off.  [name] Lick. Russella Dar MD Clementeen Graham

## 2012-07-20 NOTE — Evaluation (Signed)
Physical Therapy Evaluation Patient Details Name: [name] MRN: [id number] DOB: 07-May-1917 Today's Date: 07/20/2012 Time: 6295-2841 PT Time Calculation (min): 33 min  PT Assessment / Plan / Recommendation Clinical Impression  78 yo female admitted with lower GI bleed. On eval pt requires Min-Mod assist for all mobility. Demonstrates general weakness, decreased activity tolerance and impaired balance. Pt is alone during day at times. Son present and discussed with both the possible need for 24 hour supervision if pt were to d/c home. If 24 hour  S/A is not available, may need to consider ST rehab.     PT Assessment  Patient needs continued PT services    Follow Up Recommendations  Home health PT;Supervision/Assistance - 24 hour vs Post acute inpatient-SNF (depending on progress and family's ability to provide 24 hour supervision)    Does the patient have the potential to tolerate intense rehabilitation   No, Recommend SNF  Barriers to Discharge Decreased caregiver support      Equipment Recommendations  Tub/shower bench;Tub/shower seat    Recommendations for Other Services OT consult   Frequency Min 3X/week    Precautions / Restrictions Precautions Precautions: Fall Restrictions Weight Bearing Restrictions: No   Pertinent Vitals/Pain No c/o      Mobility  Bed Mobility Bed Mobility: Supine to Sit Supine to Sit: 3: Mod assist;HOB elevated Details for Bed Mobility Assistance: Increased time. Multimodal cues for safety, technique, hand placement. Assist for trunk to upright and bil LEs off bed. Utilized bed pad to assist.  Transfers Transfers: Sit to Stand;Stand to Sit Sit to Stand: 3: Mod assist;From bed;From chair/3-in-1 Stand to Sit: 4: Min assist;To chair/3-in-1 Details for Transfer Assistance: x 2. Multimodal cues for safety, technique, hand placement. Assist to rise, stabilize, control descent. LOB x 2 posteriorly with initial standing. Posterior bias.    Ambulation/Gait Ambulation/Gait Assistance: 4: Min assist Ambulation Distance (Feet): 50 Feet Assistive device: Rolling walker Ambulation/Gait Assistance Details: VCs safety, distance from RW. Assist to stabilize throughout ambulation. Followed with recliner.  Gait Pattern: Decreased stride length;Decreased step length - right;Decreased step length - left;Step-through pattern;Narrow base of support;Trunk flexed    Shoulder Instructions     Exercises     PT Diagnosis: Difficulty walking;Generalized weakness  PT Problem List: Decreased strength;Decreased activity tolerance;Decreased balance;Decreased mobility;Decreased knowledge of use of DME;Decreased cognition PT Treatment Interventions: DME instruction;Gait training;Stair training;Functional mobility training;Therapeutic activities;Therapeutic exercise;Balance training;Patient/family education   PT Goals Acute Rehab PT Goals PT Goal Formulation: With patient/family Time For Goal Achievement: 08/03/12 Potential to Achieve Goals: Good Pt will go Supine/Side to Sit: with supervision PT Goal: Supine/Side to Sit - Progress: Goal set today Pt will go Sit to Supine/Side: with supervision PT Goal: Sit to Supine/Side - Progress: Goal set today Pt will go Sit to Stand: with supervision PT Goal: Sit to Stand - Progress: Goal set today Pt will Ambulate: 51 - 150 feet;with supervision;with least restrictive assistive device PT Goal: Ambulate - Progress: Goal set today Pt will Go Up / Down Stairs: 3-5 stairs;with min assist;with rail(s) PT Goal: Up/Down Stairs - Progress: Goal set today  Visit Information  Last PT Received On: 07/20/12 Assistance Needed: +1    Subjective Data  Subjective: "My muscles....." Patient Stated Goal: Home   Prior Functioning  Home Living Lives With: Alone Available Help at Discharge: Family (daughter available at night; son intermittently during day) Type of Home: House Home Access: Stairs to  enter [employer] of Steps: 6 Entrance Stairs-Rails: Right;Left Home Layout: One level Bathroom  Shower/Tub: Tub/shower unit Home Adaptive Equipment: Walker - rolling;Straight cane;Bedside commode/3-in-1 Additional Comments: daughter interested in shower chair/tub bench Prior Function Level of Independence: Needs assistance Needs Assistance: Bathing Bath: Moderate Able to Take Stairs?: Yes Driving: No Vocation: Retired Comments: assist to get into tub and to bath occasionally Communication Communication: No difficulties    Cognition  Overall Cognitive Status: Impaired Area of Impairment: Following commands;Safety/judgement;Awareness of errors;Problem solving Arousal/Alertness: Awake/alert Orientation Level: Appears intact for tasks assessed Behavior During Session: [facility] for tasks performed Following Commands: Follows one step commands inconsistently Safety/Judgement: Decreased safety judgement for tasks assessed Awareness of Errors: Assistance required to identify errors made;Assistance required to correct errors made    Extremity/Trunk Assessment Right Lower Extremity Assessment RLE ROM/Strength/Tone: Deficits RLE ROM/Strength/Tone Deficits: Strength at least 3+/5 throughout. Decreased muscle tone.  Left Lower Extremity Assessment LLE ROM/Strength/Tone: Deficits LLE ROM/Strength/Tone Deficits: Strength at least 3+/5 throughout. Decreased muscle tone.    Balance Balance Balance Assessed: Yes Static Standing Balance Static Standing - Balance Support: Bilateral upper extremity supported Static Standing - Level of Assistance: 4: Min assist Dynamic Standing Balance Dynamic Standing - Balance Support: Bilateral upper extremity supported Dynamic Standing - Level of Assistance: 4: Min assist  End of Session PT - End of Session Equipment Utilized During Treatment: Gait belt Activity Tolerance: Patient limited by fatigue Patient left: in chair;with family/visitor  present;with chair alarm set;with call bell/phone within reach  GP     Rebeca Alert [facility] 07/20/2012, 3:18 PM [id number]

## 2012-07-20 NOTE — Progress Notes (Signed)
Subjective: Awake and alert. Has a c/o mucus in the throat. No pain or discomfort.   Objective: Lab: Lab Results  Component Value Date   WBC 6.0 07/16/2012   HGB 10.1* 07/20/2012   HCT 30.7* 07/20/2012   MCV 82.9 07/16/2012   PLT 118* 07/16/2012   BMET    Component Value Date/Time   NA 139 07/15/2012 2230   K 3.8 07/15/2012 2230   CL 102 07/15/2012 2230   CO2 27 07/15/2012 2230   GLUCOSE 126* 07/15/2012 2230   BUN 38* 07/15/2012 2230   CREATININE 1.16* 07/15/2012 2230   CALCIUM 9.4 07/15/2012 2230   GFRNONAA 39* 07/15/2012 2230   GFRAA 46* 07/15/2012 2230     Imaging:  Scheduled Meds:   . ciprofloxacin  400 mg Intravenous Q12H  . fluticasone  2 spray Each Nare Daily  . furosemide  20 mg Intravenous Once  . isosorbide mononitrate  30 mg Oral Daily  . levothyroxine  88 mcg Oral QAC breakfast  . mesalamine  1,000 mg Rectal BID  . metronidazole  500 mg Intravenous Q8H  . pantoprazole  40 mg Oral Daily  . peg 3350 powder  1 kit Oral Once  . sertraline  25 mg Oral Daily  . sodium chloride  3 mL Intravenous Q12H   Continuous Infusions:   . sodium chloride 20 mL/hr at 07/19/12 1925  . dextrose 5 % and 0.9% NaCl 50 mL/hr at 07/19/12 1116   PRN Meds:.HYDROcodone-acetaminophen, nitroGLYCERIN, ondansetron (ZOFRAN) IV, ondansetron, DISCONTD: fentaNYL, DISCONTD: midazolam   Physical Exam: Filed Vitals:   07/19/12 2144  BP: 132/73  Pulse: 64  Temp: 98 F (36.7 C)  Resp: 16   Frail elderly AA woman Cor - distnat but rgular Pulm - clear Abd - BS+, soft.     Assessment/Plan: 1. GI - LGI bleed - hemoglobin has been stable. Colonoscopy with sterrcoral ulcer and diverticulosis.  Plan Advance diet  canassa suppository bid  Continue H/H q 12  D/c antibiotics   Illene Regulus [place] [facility] (o) [phone]; (c) [phone] Call-grp - Patsi Sears IM Tele: [phone]  07/20/2012, 11:29 AM

## 2012-07-21 LAB — HEMOGLOBIN AND HEMATOCRIT, BLOOD: HCT: 29.8 % — ABNORMAL LOW (ref 36.0–46.0)

## 2012-07-21 NOTE — Op Note (Addendum)
[facility] [address] [place], [address]   COLONOSCOPY PROCEDURE REPORT  PATIENT: [name], [name]  MR#: [id number] BIRTHDATE: 06/27/1917 , 89  yrs. old GENDER: Female ENDOSCOPIST: Meryl Dare, MD, [facility] REFERRED [id number] Esther Hardy, M.D. PROCEDURE DATE:  07/19/2012 PROCEDURE:   Colonoscopy with snare polypectomy ASA CLASS:   Class III INDICATIONS:hematochezia and an abnormal CT of colon. MEDICATIONS: medications were titrated to patient response per physician's verbal order, Fentanyl 37.5 mcg IV, and Versed 2 mg IV  DESCRIPTION OF PROCEDURE:   After the risks benefits and alternatives of the procedure were thoroughly explained, informed consent was obtained.  A digital rectal exam revealed no abnormalities of the rectum.   The Pentax Ped Colon [id number] endoscope was introduced through the anus and advanced to the cecum, which was identified by both the appendix and ileocecal valve. No adverse events experienced.   The quality of the prep was good, using MoviPrep  The instrument was then slowly withdrawn as the colon was fully examined.  COLON FINDINGS: A sessile polyp measuring 5 mm in size was found at the cecum.  A polypectomy was performed with a cold snare.  The resection was complete and the polyp tissue was completely retrieved. Mild diverticulosis was noted in the sigmoid colon. A medium sized, benign appearing, nonbleeding ulcer was found in the rectum, possibly from enema just prior to colonosocpy or stercoral ulcer.   The colon was otherwise normal. There was no blood in the colon. There was no diverticulosis, inflammation, polyps or cancers unless previously stated.  Retroflexed views revealed small internal hemorrhoids. The time to cecum=4 minutes 00 seconds. Withdrawal time=15 minutes 0 seconds.  The scope was withdrawn and the procedure completed. COMPLICATIONS: There were no complications.  ENDOSCOPIC IMPRESSION: 1.   Sessile polyp  measuring at the cecum; polypectomy performed with a cold snare 2.   Mild diverticulosis was noted in the sigmoid colon 3.   Medium sized ulcer in the rectum, possibly from enema or stercoral 4.   Small internal hemorrhoids  RECOMMENDATIONS: 1.  Await pathology results 2.  Given your age, you will not need another colonoscopy for colon cancer screening or polyp surveillance.  These types of tests usually stop around the age 14.   eSigned:  Meryl Dare, MD, [facility] 07/19/2012 4:58 PM Revised: 07/19/2012 4:58 PM

## 2012-07-21 NOTE — Progress Notes (Signed)
Subjective: [name] is awaske and alert but HOH. She denies any pain or discomfort except for mild chronic back pain. She reports that she was able to get out of bed yesterday.  Objective: Lab: Lab Results  Component Value Date   WBC 6.0 07/16/2012   HGB 9.4* 07/21/2012   HCT 28.4* 07/21/2012   MCV 82.9 07/16/2012   PLT 118* 07/16/2012   BMET    Component Value Date/Time   NA 139 07/15/2012 2230   K 3.8 07/15/2012 2230   CL 102 07/15/2012 2230   CO2 27 07/15/2012 2230   GLUCOSE 126* 07/15/2012 2230   BUN 38* 07/15/2012 2230   CREATININE 1.16* 07/15/2012 2230   CALCIUM 9.4 07/15/2012 2230   GFRNONAA 39* 07/15/2012 2230   GFRAA 46* 07/15/2012 2230     Imaging:  Scheduled Meds:   . fluticasone  2 spray Each Nare Daily  . furosemide  20 mg Intravenous Once  . isosorbide mononitrate  30 mg Oral Daily  . levothyroxine  88 mcg Oral QAC breakfast  . mesalamine  1,000 mg Rectal BID  . pantoprazole  40 mg Oral Daily  . sertraline  25 mg Oral Daily  . sodium chloride  3 mL Intravenous Q12H  . [DISCONTINUED] ciprofloxacin  400 mg Intravenous Q12H  . [DISCONTINUED] metronidazole  500 mg Intravenous Q8H  . [DISCONTINUED] peg 3350 powder  1 kit Oral Once   Continuous Infusions:   . sodium chloride 20 mL/hr at 07/19/12 1925  . dextrose 5 % and 0.9% NaCl 50 mL/hr at 07/19/12 1116   PRN Meds:.HYDROcodone-acetaminophen, nitroGLYCERIN, ondansetron (ZOFRAN) IV, ondansetron   Physical Exam: Filed Vitals:   07/21/12 0600  BP: 163/78  Pulse: 68  Temp: 98.9 F (37.2 C)  Resp: 18   gen'l - elderly AA soman HEENT- arcus senilis Cor- RRR PUlm - normal respirations Abd- BS+ , soft, non-tender     Assessment/Plan: 1. GI - LGI bleed - Hgb did drop overnight from 10.4 to 9.4  Plan - Not ready for discharge with dropping Hgb. Will continue q12 hr H/H  Continue Canasa suppository for stercoral ulcer.    Illene Regulus [place] [facility] (o) [phone]; (c) [phone] Call-grp -  Patsi Sears IM Tele: [phone]  07/21/2012, 9:34 AM

## 2012-07-22 ENCOUNTER — Encounter (HOSPITAL_COMMUNITY): Payer: Self-pay

## 2012-07-22 ENCOUNTER — Encounter (HOSPITAL_COMMUNITY): Payer: Self-pay | Admitting: Gastroenterology

## 2012-07-22 DIAGNOSIS — M545 Low back pain: Secondary | ICD-10-CM

## 2012-07-22 LAB — HEMOGLOBIN AND HEMATOCRIT, BLOOD
HCT: 27.2 % — ABNORMAL LOW (ref 36.0–46.0)
HCT: 29.2 % — ABNORMAL LOW (ref 36.0–46.0)
Hemoglobin: 9.1 g/dL — ABNORMAL LOW (ref 12.0–15.0)

## 2012-07-22 NOTE — Progress Notes (Signed)
Physical Therapy Treatment Patient Details Name: [name] MRN: [id number] DOB: [birth year] Today's Date: 07/22/2012 Time: 8657-8469 PT Time Calculation (min): 26 min  PT Assessment / Plan / Recommendation Comments on Treatment Session  Continues to demonstrate general weakness, decreased activity tolerance and impaired balance. Daughter present-discussed possible need for 24 hour supervision/assist for safety, mobility due to pt's risk for falling. (Discussed this with son on evaluation, as well.) Daughter states that she may be able to have aide come out. Not sure if this will cover 24 hours????     Follow Up Recommendations  Home health PT;Supervision/Assistance - 24 hour;Supervision for mobility/OOB     Does the patient have the potential to tolerate intense rehabilitation     Barriers to Discharge        Equipment Recommendations  Tub/shower seat;Tub/shower bench    Recommendations for Other Services OT consult  Frequency Min 3X/week   Plan Discharge plan remains appropriate    Precautions / Restrictions Precautions Precautions: Fall Restrictions Weight Bearing Restrictions: No   Pertinent Vitals/Pain Back-unrated    Mobility  Bed Mobility Bed Mobility: Supine to Sit Supine to Sit: 4: Min assist;HOB elevated;With rails Details for Bed Mobility Assistance: Increased time. Multimodal cues for safety, technique. Assist for trunk to upright. Bed pad used to assist pt to EOB. Transfers Transfers: Sit to Stand;Stand to Sit Sit to Stand: 3: Mod assist;From bed;With upper extremity assist Stand to Sit: To chair/3-in-1;With armrests;With upper extremity assist Details for Transfer Assistance: Multimodal cues for safety, technique, hand placement. Assist to rise, stabilize, control descent. Posterior bias with inital standing.  Ambulation/Gait Ambulation/Gait Assistance: 4: Min assist Ambulation Distance (Feet): 60 Feet Assistive device: Rolling walker Ambulation/Gait  Assistance Details: Multimodal cues for safety, distance from RW. Assist to stabilize throughout ambulation, maneuver with RW. Fatigues easily. VCs for pt to increase step length, posture, gait speed.  Gait Pattern: Decreased stride length;Decreased step length - right;Decreased step length - left;Narrow base of support;Trunk flexed    Exercises     PT Diagnosis:    PT Problem List:   PT Treatment Interventions:     PT Goals Acute Rehab PT Goals Pt will go Supine/Side to Sit: with supervision PT Goal: Supine/Side to Sit - Progress: Progressing toward goal Pt will go Sit to Stand: with supervision PT Goal: Sit to Stand - Progress: Progressing toward goal Pt will Ambulate: 51 - 150 feet;with supervision;with least restrictive assistive device PT Goal: Ambulate - Progress: Progressing toward goal  Visit Information  Last PT Received On: 07/22/12 Assistance Needed: +1    Subjective Data  Subjective: "Whew....that felt like I walkled a mile." Patient Stated Goal: Home   Cognition  Overall Cognitive Status: Impaired Area of Impairment: Memory;Following commands;Safety/judgement;Awareness of errors;Problem solving Arousal/Alertness: Awake/alert Behavior During Session: [facility] for tasks performed Following Commands: Follows one step commands inconsistently Safety/Judgement: Decreased safety judgement for tasks assessed Awareness of Errors: Assistance required to identify errors made;Assistance required to correct errors made    Balance  Static Standing Balance Static Standing - Balance Support: Bilateral upper extremity supported Static Standing - Level of Assistance: 4: Min assist Dynamic Standing Balance Dynamic Standing - Balance Support: Bilateral upper extremity supported Dynamic Standing - Level of Assistance: 4: Min assist  End of Session PT - End of Session Equipment Utilized During Treatment: Gait belt Activity Tolerance: Patient limited by fatigue Patient left: in  chair;with call bell/phone within reach;with family/visitor present;with chair alarm set   GP     Gershon Mussel  07/22/2012, 11:49 AM [id number]

## 2012-07-22 NOTE — Progress Notes (Signed)
Subjective: [name] has no complaints. She is unaware of any blood stool.s RN reports no bloody stools overnight and no blood noticed on insertion of suppository.  Objective: Lab: Lab Results  Component Value Date   WBC 6.0 07/16/2012   HGB 9.1* 07/22/2012   HCT 27.2* 07/22/2012   MCV 82.9 07/16/2012   PLT 118* 07/16/2012   BMET    Component Value Date/Time   NA 139 07/15/2012 2230   K 3.8 07/15/2012 2230   CL 102 07/15/2012 2230   CO2 27 07/15/2012 2230   GLUCOSE 126* 07/15/2012 2230   BUN 38* 07/15/2012 2230   CREATININE 1.16* 07/15/2012 2230   CALCIUM 9.4 07/15/2012 2230   GFRNONAA 39* 07/15/2012 2230   GFRAA 46* 07/15/2012 2230     Imaging:  Scheduled Meds:   . fluticasone  2 spray Each Nare Daily  . furosemide  20 mg Intravenous Once  . isosorbide mononitrate  30 mg Oral Daily  . levothyroxine  88 mcg Oral QAC breakfast  . mesalamine  1,000 mg Rectal BID  . pantoprazole  40 mg Oral Daily  . sertraline  25 mg Oral Daily  . sodium chloride  3 mL Intravenous Q12H   Continuous Infusions:   . sodium chloride 20 mL/hr at 07/19/12 1925  . dextrose 5 % and 0.9% NaCl 50 mL/hr at 07/19/12 1116   PRN Meds:.HYDROcodone-acetaminophen, nitroGLYCERIN, ondansetron (ZOFRAN) IV, ondansetron   Physical Exam: Filed Vitals:   07/22/12 0600  BP: 134/76  Pulse: 85  Temp: 98.5 F (36.9 C)  Resp: 18   Elderly AA woman in no distress HEENT - normal except for eyes Cor- RRR Pulm - normal respirations.      Assessment/Plan: 1. GI - LGI bleed - continued drop in Hgb suggestive of continued slow bleed: Plan Continue to monitor Hgb  Continue Canasa suppository for stercoral ulcer  Transfusion threshold is 8g or less  If Hgb stabilizes will d/c home  2. HTN - blood pressure has been stable  3. Cardiac - stable   Called daughter with update. Illene Regulus [facility] [phone]) [phone]; (c) [phone] Call-grp - [facility] Tele: [phone]  07/22/2012, 7:29  AM

## 2012-07-23 ENCOUNTER — Encounter: Payer: Self-pay | Admitting: Gastroenterology

## 2012-07-23 DIAGNOSIS — I428 Other cardiomyopathies: Secondary | ICD-10-CM

## 2012-07-23 LAB — HEMOGLOBIN AND HEMATOCRIT, BLOOD: Hemoglobin: 9.8 g/dL — ABNORMAL LOW (ref 12.0–15.0)

## 2012-07-23 MED ORDER — MESALAMINE 1000 MG RE SUPP: 1000.0000 mg | Freq: Two times a day (BID) | RECTAL | Status: DC

## 2012-07-23 NOTE — Discharge Summary (Signed)
NAMEPATTON, WERBER NO.:  [id number]  MEDICAL RECORD NO.:  [id number]  LOCATION:  1503                         FACILITY:  [facility]  PHYSICIAN:  Rosalyn Gess. Norins, MD  DATE OF BIRTH:  1916/10/23  DATE OF ADMISSION:  07/15/2012 DATE OF DISCHARGE:  07/23/2012                              DISCHARGE SUMMARY   ADMITTING DIAGNOSES: 1. Hematochezia. 2. Hypothyroid disease. 3. Hypertension. 4. Cardiomyopathy.  DISCHARGE DIAGNOSES: 1. Lower gastrointestinal bleed with stercoral ulcer and diverticular     changes. 2. Hypothyroid disease. 3. Hypertension 4. Cardiomyopathy.  CONSULTANTS:  Dr. Claudette Head for GI.  PROCEDURES: 1. CT scan of the abdomen and pelvis October 29, which showed mild     thickening of the walls of the cecum, ascending colon, which could     be due to under distention versus colitis. 2. Colonoscopy performed July 19, 2012, which revealed a sessile     polyp in the cecum.  Mild diverticulosis was noted in the sigmoid     colon.  Medium size ulcer in the rectum possibly from enema or     stercoral type ulcer.  Small internal hemorrhoids were noted.  HISTORY OF PRESENT ILLNESS:  Ms. Canavan is a frail 78 year old African American woman who has a history approximately 18 months ago of a major lower GI bleed requiring 8 units of blood.  She did make a good recovery from that after a short stay at [facility] and has been at home doing relatively well given her age and medical problems.  The patient presented to the emergency department after having several episodes of hematochezia.  She had some mild lightheadedness but no other symptoms, specifically denying shortness of breath or chest pain.  Her baseline hemoglobin is about 10 and she had hemoglobin at admission of 8.4 g. The patient was admitted for lower GI bleed and transfusion and further workup.  Please see the H and P for past medical history, family history, social history, and  admission exam.  HOSPITAL COURSE:  The patient was admitted to a telemetry bed which was changed to a regular med/surg bed on the first full hospital day.  The patient was typed, cross-matched and transfused 2 units of packed red cells with an appropriate response in hemoglobin to approximately 11, adjusted quickly down to 10.4.  She did have several more bloody stools, but her hemoglobin never took a significant dip and she did not require any additional transfusions.  The patient was seen in consultation by the GI Service.  She was taken for colonoscopy on November 1 with findings as noted.  She was started on Canasa suppositories for her stercoral ulcer.  The patient did have serial hemoglobins drawn with her hemoglobin going from 11-10.8 on September 30, was back up to 11.2 November 1, stayed around 10 on the 12th, dropped to 9.4 on November 3, 9.1 on November 4, rising to 9.6 in the p.m. and 9.8 on the day of discharge.  The patient had no reports of further hematochezia.  She had no abdominal pain or tenderness.  It was felt that she was at this point stable with her hemoglobin having stabilized with no signs  of continued active bleeding.  PLAN: 1. The patient is to be discharged to home.  She will continue on     Canasa suppositories twice a day.  She will be seen in the office     for followup in 5-7 days that will include a followup lab study. 2. The patient's hypothyroid disease is stable.  Last TSH on October     29 was 1.844. 3. Hypertension.  The patient's blood pressure did remain stable     during this hospital stay.  DISCHARGE EXAMINATION:  VITAL SIGNS:  Temperature was 98.4, blood pressure 149/64, pulse 59, respirations 24, oxygen saturation 99%. GENERAL APPEARANCE:  This is a very frail-appearing elderly 78 year old African American woman, in no acute distress. HEENT:  Normocephalic, atraumatic.  Mild temporal wasting is noted. Conjunctivae and sclerae was clear.   The patient has significant arcus senilis bilaterally.  She has appearance of significant cataract on the left eye.  Oropharynx, the patient is edentulous with no oral lesions. NECK:  Supple. PULMONARY:  The patient has no increased work of breathing.  She is moving air well. CARDIOVASCULAR:  Radial pulse 2+.  Her precordium is quiet.  She has pectus carinatum that is mild.  Heart sounds were distant, but regular. ABDOMEN:  Scaphoid.  She had positive bowel sounds in all 4 quadrants. She had no guarding or rebound, no tenderness. GENITALIA/RECTAL:  Deferred. EXTREMITIES:  Thin but without significant deformity. NEURO:  The patient is very soft spoken but seems to be awake and alert. She answers questions appropriately.  Cranial nerves II-XII were grossly intact.  Motor strength, the patient is able to move all of her extremities in bed.  She has been able to get out of bed with assistance.  DISPOSITION:  The patient will be discharged to home.  We will arrange for a home health nursing and physical therapy to help recondition her and to monitor her medications.  She will continue on all of her home medications with the only addition being Canasa suppositories.  She will continue on her thyroid replacement.  The patient's condition at time of discharge dictation is stable but guarded given her very advanced age and multiple comorbidities.     Rosalyn Gess Norins, MD     MEN/MEDQ  D:  07/23/2012  T:  07/23/2012  Job:  [id number]  cc:   Venita Lick. Russella Dar, MD, FACG [address] [place] [address]  Barbette Hair. Arlyce Dice, MD,FACG [address] [place] [address]

## 2012-07-23 NOTE — Progress Notes (Signed)
Pt and  daughter were given all discharge instructions and informed where to pick up medications. Pt's daughter verbalized understanding of all discharge instructions. Home health was consulted and will be seeing the pt at home. Dr. Filbert Schilder was called for clarification on the Aspirin dose which was requested by the pt's daughter. Dr. Filbert Schilder stated to keep the dose on MWF instead of everyday which the discharge instructions stated. The pt's daughter had no questions or concerns. Orson Ape D 07/23/2012

## 2012-07-23 NOTE — Progress Notes (Signed)
Ready for d/c home. Home health requested - f-f form completed Dictated # [id number]

## 2012-07-23 NOTE — Progress Notes (Signed)
  Pt has had a great night with no complaints. Pt has not had any bloody stools. IV site is [name], Pt is resting comfortably.  [name], [name]

## 2012-07-25 ENCOUNTER — Other Ambulatory Visit: Payer: Self-pay | Admitting: Internal Medicine

## 2012-08-01 ENCOUNTER — Ambulatory Visit (INDEPENDENT_AMBULATORY_CARE_PROVIDER_SITE_OTHER): Payer: Medicare HMO | Admitting: Internal Medicine

## 2012-08-01 ENCOUNTER — Other Ambulatory Visit (INDEPENDENT_AMBULATORY_CARE_PROVIDER_SITE_OTHER): Payer: Medicare HMO

## 2012-08-01 ENCOUNTER — Other Ambulatory Visit: Payer: Medicare HMO

## 2012-08-01 ENCOUNTER — Encounter: Payer: Self-pay | Admitting: Internal Medicine

## 2012-08-01 VITALS — BP 152/88 | HR 73 | Temp 97.9°F | Resp 12 | Wt 80.1 lb

## 2012-08-01 DIAGNOSIS — D62 Acute posthemorrhagic anemia: Secondary | ICD-10-CM

## 2012-08-01 DIAGNOSIS — I10 Essential (primary) hypertension: Secondary | ICD-10-CM

## 2012-08-01 DIAGNOSIS — K922 Gastrointestinal hemorrhage, unspecified: Secondary | ICD-10-CM

## 2012-08-01 DIAGNOSIS — Z515 Encounter for palliative care: Secondary | ICD-10-CM

## 2012-08-01 LAB — HEMOGLOBIN AND HEMATOCRIT, BLOOD: HCT: 32.4 % — ABNORMAL LOW (ref 36.0–46.0)

## 2012-08-01 NOTE — Patient Instructions (Addendum)
Thank you for coming to the office for follow up. I am delighted that the bleeding did not recur.  Plan - will recheck blood count today  Continue to eat a good diet - your weight is down a litte from the last office visit  Be sure you are getting iron in your diet: raisins, greens, red meat, organ meat, etc.   Call me for any problems.   Iron-Rich Diet An iron-rich diet contains foods that are good sources of iron. Iron is an important mineral that helps your body produce hemoglobin. Hemoglobin is a protein in red blood cells that carries oxygen to the body's tissues. Sometimes, the iron level in your blood can be low. This may be caused by:  A lack of iron in your diet.   Blood loss.   Times of growth, such as during pregnancy or during a child's growth and development.  Low levels of iron can cause a decrease in the number of red blood cells. This can result in iron deficiency anemia. Iron deficiency anemia symptoms include:  Tiredness.   Weakness.   Irritability.   Increased chance of infection.  Here are some recommendations for daily iron intake:  Males older than 76 years of age need 8 mg of iron per day.   Women ages 68 to 28 need 18 mg of iron per day.   Pregnant women need 27 mg of iron per day, and women who are over 80 years of age and breastfeeding need 9 mg of iron per day.   Women over the age of 48 need 8 mg of iron per day.  SOURCES OF IRON There are 2 types of iron that are found in food: heme iron and nonheme iron. Heme iron is absorbed by the body better than nonheme iron. Heme iron is found in meat, poultry, and fish. Nonheme iron is found in grains, beans, and vegetables. Heme Iron Sources Food / Iron (mg)  Chicken liver, 3 oz (85 g)/ 10 mg   Beef liver, 3 oz (85 g)/ 5.5 mg   Oysters, 3 oz (85 g)/ 8 mg   Beef, 3 oz (85 g)/ 2 to 3 mg   Shrimp, 3 oz (85 g)/ 2.8 mg   [place], 3 oz (85 g)/ 2 mg   Chicken, 3 oz (85 g) / 1 mg   Fish (tuna, halibut),  3 oz (85 g)/ 1 mg   Pork, 3 oz (85 g)/ 0.9 mg  Nonheme Iron Sources Food / Iron (mg)  Ready-to-eat breakfast cereal, iron-fortified / 3.9 to 7 mg   Tofu,  cup / 3.4 mg   Kidney beans,  cup / 2.6 mg   Baked potato with skin / 2.7 mg   Asparagus,  cup / 2.2 mg   Avocado / 2 mg   Dried peaches,  cup / 1.6 mg   Raisins,  cup / 1.5 mg   Soy milk, 1 cup / 1.5 mg   Whole-wheat bread, 1 slice / 1.2 mg   Spinach, 1 cup / 0.8 mg   Broccoli,  cup / 0.6 mg  IRON ABSORPTION Certain foods can decrease the body's absorption of iron. Try to avoid these foods and beverages while eating meals with iron-containing foods:  Coffee.   Tea.   Fiber.   Soy.  Foods containing vitamin C can help increase the amount of iron your body absorbs from iron sources, especially from nonheme sources. Eat foods with vitamin C along with iron-containing foods to increase  your iron absorption. Foods that are high in vitamin C include many fruits and vegetables. Some good sources are:  Fresh orange juice.   Oranges.   Strawberries.   Mangoes.   Grapefruit.   Red bell peppers.   Green bell peppers.   Broccoli.   Potatoes with skin.   Tomato juice.  Document Released: 04/18/2005 Document Revised: 11/27/2011 Document Reviewed: 02/23/2011 [facility] Patient Information 2013 [place], [place].

## 2012-08-01 NOTE — Progress Notes (Signed)
Subjective:    Patient ID: [name], female    DOB: 1917/04/09, 78 y.o.   MRN: [id number]  HPI [name] was recently hospitalized for hematochezia. Her Hgb was 7.0 at admission. She was transfused to a Hgb of 11.0 and at the time of discharge Hgb was 9.8. During her stay enodoscopy revealed diverticular changes and a stercoral ulcer. She was started on canasa suppositories. She had no recurrent bleeding and was d/c to home.   Since discharge she reports and daughter confirms she has not had any further bleeding episodes. She has had a good appetite and has been eating well. She does feel weak but has no specific complaints.   Past Medical History  Diagnosis Date  . Internal hemorrhoid   . Diverticulosis of colon (without mention of hemorrhage)   . Herpes zoster   . Glaucoma(365)   . Lumbar back pain   . Hypertension   . Cardiomyopathy   . Hypothyroid    Past Surgical History  Procedure Date  . Thyroidectomy   . Appendectomy   . Tubal ligation   . Colonoscopy 07/19/2012    Procedure: COLONOSCOPY;  Surgeon: Meryl Dare, MD,FACG;  Location: [facility];  Service: Endoscopy;  Laterality: N/A;   History reviewed. No pertinent family history. History   Social History  . Marital Status: Widowed    Spouse Name: N/A    Number of Children: N/A  . Years of Education: N/A   Occupational History  . Not on file.   Social History Main Topics  . Smoking status: Never Smoker   . Smokeless tobacco: Never Used  . Alcohol Use: No  . Drug Use: No  . Sexually Active: Not Currently   Other Topics Concern  . Not on file   Social History Narrative   Lives alone but family stay with her at night. She remains independent. She has a DNR established but does want medical care.    Current Outpatient Prescriptions on File Prior to Visit  Medication Sig Dispense Refill  . aspirin EC 81 MG tablet Take 81 mg by mouth daily.      Marland Kitchen b complex vitamins capsule Take 1 capsule by mouth  daily.        . fluticasone (VERAMYST) 27.5 MCG/SPRAY nasal spray Place 2 sprays into the nose daily as needed. For allergies      . HYDROcodone-acetaminophen (NORCO) 5-325 MG per tablet Take 1 tablet by mouth every 6 (six) hours as needed for pain.  6 tablet  0  . isosorbide mononitrate (IMDUR) 30 MG 24 hr tablet Take 30 mg by mouth daily.      Marland Kitchen levothyroxine (SYNTHROID, LEVOTHROID) 88 MCG tablet TAKE 1 TABLET BY MOUTH ONCE A DAY  30 tablet  5  . lisinopril-hydrochlorothiazide (PRINZIDE,ZESTORETIC) 10-12.5 MG per tablet TAKE 1 TABLET BY MOUTH DAILY.  30 tablet  10  . mesalamine (CANASA) 1000 MG suppository Place 1 suppository (1,000 mg total) rectally 2 (two) times daily.  30 suppository  1  . nitroGLYCERIN (NITROSTAT) 0.4 MG SL tablet Place 1 tablet (0.4 mg total) under the tongue every 5 (five) minutes as needed.  30 tablet  5  . pantoprazole (PROTONIX) 40 MG tablet TAKE 1 TABLET BY MOUTH DAILY  30 tablet  5  . sertraline (ZOLOFT) 25 MG tablet Take 25 mg by mouth daily.      . sertraline (ZOLOFT) 25 MG tablet TAKE 1 TABLET BY MOUTH DAILY  30 tablet  10  . sucralfate (  CARAFATE) 1 GM/10ML suspension Take 1 g by mouth 4 (four) times daily.      . ondansetron (ZOFRAN-ODT) 8 MG disintegrating tablet Take 8 mg by mouth every 8 (eight) hours as needed. 8mg  ODT q4 hours prn nausea      . [DISCONTINUED] isosorbide dinitrate (ISORDIL) 30 MG tablet Take 30 mg by mouth 3 (three) times daily.        . [DISCONTINUED] ranitidine (ZANTAC) 150 MG tablet Take 150 mg by mouth 2 (two) times daily.            Review of Systems System review is negative for any constitutional, cardiac, pulmonary, GI or neuro symptoms or complaints other than as described in the HPI.     Objective:   Physical Exam Filed Vitals:   08/01/12 1311  BP: 152/88  Pulse: 73  Temp: 97.9 F (36.6 C)  Resp: 12   Wt Readings from Last 3 Encounters:  08/01/12 80 lb 1.3 oz (36.324 kg)  07/16/12 94 lb (42.638 kg)  07/16/12 94 lb  (42.638 kg)   Gen'l- an emaciated AA woman, soft spoken, in no distress HEENT- sunken cheeks, temporal wasting. Cor- RRR Pulm - normal respirations Abd- BS+, soft, no guarding or rebound Neuro - appears to be alert and oriented, soft spoken, no focal deficits       Assessment & Plan:

## 2012-08-02 ENCOUNTER — Telehealth: Payer: Self-pay | Admitting: *Deleted

## 2012-08-02 DIAGNOSIS — Z515 Encounter for palliative care: Secondary | ICD-10-CM | POA: Insufficient documentation

## 2012-08-02 NOTE — Assessment & Plan Note (Signed)
LGI bleed October '13 - received 2 units PRBCs, colonoscopy revealing diverticulosis and sterocoral ulcer. Started on Canasa suppositories. Has had no further bleeding since being home and has had no pain or difficulty using canasa suppositories.  Plan Complete course of canasa suppositories  F/u Hgb  Iron rich diet  Addendum : Hgb 10.6 g

## 2012-08-02 NOTE — Telephone Encounter (Signed)
Called pt and spoke with pt's daughter. Advised that Hgb is stable and doing well.

## 2012-08-02 NOTE — Assessment & Plan Note (Signed)
BP Readings from Last 3 Encounters:  08/01/12 152/88  07/23/12 149/64  07/23/12 149/64

## 2012-08-02 NOTE — Assessment & Plan Note (Signed)
Follow-up Hgb 10.6g

## 2012-08-07 ENCOUNTER — Other Ambulatory Visit: Payer: Self-pay | Admitting: Internal Medicine

## 2012-08-07 ENCOUNTER — Telehealth: Payer: Self-pay

## 2012-08-07 ENCOUNTER — Other Ambulatory Visit (INDEPENDENT_AMBULATORY_CARE_PROVIDER_SITE_OTHER): Payer: Medicare HMO

## 2012-08-07 DIAGNOSIS — D62 Acute posthemorrhagic anemia: Secondary | ICD-10-CM

## 2012-08-07 DIAGNOSIS — H409 Unspecified glaucoma: Secondary | ICD-10-CM

## 2012-08-07 NOTE — Telephone Encounter (Signed)
Called dtr. Reassured about bleeding.  See note about eye concerns. She was seeing Dr. Chales Abrahams who closed  Referral to ophthal [facility] notified

## 2012-08-07 NOTE — Telephone Encounter (Signed)
If we do anything at 52 it will be for her to see an eye doctor of choice. We can help with referral if needed but general referral isn't required.

## 2012-08-07 NOTE — Telephone Encounter (Signed)
Ms. Nastri daughter, Irma Byerly would like a phone call from Dr. Debby Bud. She is very concerned. [phone]. Please advise.

## 2012-08-07 NOTE — Telephone Encounter (Signed)
HHPT states pt is having visual difficulty in RT eye - Hx of glaucoma and cataracts but has not seen an eye doctor in 10+ years. HHPT is requesting MD advised whether pt would benefit from eye doctor referral or OV with PCP, please advise

## 2012-08-14 ENCOUNTER — Telehealth: Payer: Self-pay | Admitting: Internal Medicine

## 2012-08-14 NOTE — Telephone Encounter (Signed)
Please read the nurse call note below:

## 2012-08-14 NOTE — Telephone Encounter (Signed)
Patient Information:  Caller Name: Bosie Clos  Phone: [phone]  Patient: [name]  Gender: Female  DOB: 11-06-1916  Age: 78 Years  PCP: Illene Regulus (Adults only)   Symptoms  Reason For Call & Symptoms: sore throat  Reviewed Health History In EMR: Yes  Reviewed Medications In EMR: Yes  Reviewed Allergies In EMR: Yes  Reviewed Surgeries / Procedures: Yes  Date of Onset of Symptoms: 08/13/2012  Guideline(s) Used:  Sore Throat  Disposition Per Guideline:   Home Care  Reason For Disposition Reached:   Sore throat  Advice Given:  Soft Diet:   Cold drinks and milk shakes are especially good (Reason: swollen tonsils can make some foods hard to swallow).  Liquids:  Adequate liquid intake is important to prevent dehydration. Drink 6-8 glasses of water per day.  Contagiousness:   You can return to work or school after the fever is gone and you feel well enough to participate in normal activities. If your doctor determines that you have Strep throat, then you will need to take an antibiotic for 24 hours before you can return.  Call Back If:  Sore throat is the main symptom and it lasts longer than 24 hours  Sore throat is mild but lasts longer than 4 days  Fever lasts longer than 3 days  You become worse.  Office Follow Up:  Does the office need to follow up with this patient?: No  Instructions For The Office: N/A  RN Note:  Sore throat 08/14/12; denies earache.  Per protocol, emergent symptoms denied; advised for home care, with callback parameters given.

## 2012-08-16 ENCOUNTER — Encounter (HOSPITAL_COMMUNITY): Payer: Self-pay

## 2012-08-16 ENCOUNTER — Emergency Department (HOSPITAL_COMMUNITY): Payer: Medicare HMO

## 2012-08-16 ENCOUNTER — Inpatient Hospital Stay (HOSPITAL_COMMUNITY)
Admission: EM | Admit: 2012-08-16 | Discharge: 2012-08-22 | DRG: 640 | Disposition: A | Discharge status: Home / Self Care | Payer: Medicare HMO | Attending: Internal Medicine | Admitting: Internal Medicine

## 2012-08-16 DIAGNOSIS — E039 Hypothyroidism, unspecified: Secondary | ICD-10-CM | POA: Diagnosis present

## 2012-08-16 DIAGNOSIS — I428 Other cardiomyopathies: Secondary | ICD-10-CM | POA: Diagnosis present

## 2012-08-16 DIAGNOSIS — Z515 Encounter for palliative care: Secondary | ICD-10-CM

## 2012-08-16 DIAGNOSIS — E871 Hypo-osmolality and hyponatremia: Principal | ICD-10-CM | POA: Diagnosis present

## 2012-08-16 DIAGNOSIS — E860 Dehydration: Secondary | ICD-10-CM

## 2012-08-16 DIAGNOSIS — D62 Acute posthemorrhagic anemia: Secondary | ICD-10-CM

## 2012-08-16 DIAGNOSIS — Z681 Body mass index (BMI) 19 or less, adult: Secondary | ICD-10-CM

## 2012-08-16 DIAGNOSIS — D649 Anemia, unspecified: Secondary | ICD-10-CM | POA: Diagnosis present

## 2012-08-16 DIAGNOSIS — R627 Adult failure to thrive: Secondary | ICD-10-CM | POA: Diagnosis present

## 2012-08-16 DIAGNOSIS — Z66 Do not resuscitate: Secondary | ICD-10-CM | POA: Diagnosis present

## 2012-08-16 DIAGNOSIS — E46 Unspecified protein-calorie malnutrition: Secondary | ICD-10-CM

## 2012-08-16 DIAGNOSIS — R569 Unspecified convulsions: Secondary | ICD-10-CM | POA: Diagnosis present

## 2012-08-16 DIAGNOSIS — H409 Unspecified glaucoma: Secondary | ICD-10-CM | POA: Diagnosis present

## 2012-08-16 DIAGNOSIS — E878 Other disorders of electrolyte and fluid balance, not elsewhere classified: Secondary | ICD-10-CM

## 2012-08-16 DIAGNOSIS — E43 Unspecified severe protein-calorie malnutrition: Secondary | ICD-10-CM | POA: Diagnosis present

## 2012-08-16 DIAGNOSIS — Z79899 Other long term (current) drug therapy: Secondary | ICD-10-CM

## 2012-08-16 DIAGNOSIS — Z7982 Long term (current) use of aspirin: Secondary | ICD-10-CM

## 2012-08-16 DIAGNOSIS — I10 Essential (primary) hypertension: Secondary | ICD-10-CM | POA: Diagnosis present

## 2012-08-16 LAB — CBC
Hemoglobin: 11.1 g/dL — ABNORMAL LOW (ref 12.0–15.0)
MCH: 28.2 pg (ref 26.0–34.0)
MCH: 27.2 pg (ref 26.0–34.0)
MCHC: 34.4 g/dL (ref 30.0–36.0)
MCV: 80.5 fL (ref 78.0–100.0)
Platelets: 200 10*3/uL (ref 150–400)
RBC: 3.94 MIL/uL (ref 3.87–5.11)
RBC: 3.53 MIL/uL — ABNORMAL LOW (ref 3.87–5.11)
RDW: 13.9 % (ref 11.5–15.5)

## 2012-08-16 LAB — TROPONIN I: Troponin I: <0.3 ng/mL (ref <0.30)

## 2012-08-16 LAB — BASIC METABOLIC PANEL
BUN: 20 mg/dL (ref 6–23)
CO2: 25 mEq/L (ref 19–32)
Calcium: 9.7 mg/dL (ref 8.4–10.5)
Chloride: 81 mEq/L — ABNORMAL LOW (ref 96–112)
Creatinine, Ser: 1.01 mg/dL (ref 0.50–1.10)
GFR calc Af Amer: 66 mL/min — ABNORMAL LOW (ref >90)
GFR calc non Af Amer: 57 mL/min — ABNORMAL LOW (ref >90)
Glucose, Bld: 150 mg/dL — ABNORMAL HIGH (ref 70–99)
Potassium: 4.4 mEq/L (ref 3.5–5.1)
Sodium: 114 mEq/L — CL (ref 135–145)

## 2012-08-16 LAB — URINALYSIS, ROUTINE W REFLEX MICROSCOPIC
Glucose, UA: NEGATIVE mg/dL
Hgb urine dipstick: NEGATIVE
Ketones, ur: NEGATIVE mg/dL
Protein, ur: NEGATIVE mg/dL

## 2012-08-16 MED ORDER — PANTOPRAZOLE SODIUM 40 MG PO TBEC: 40.0000 mg | DELAYED_RELEASE_TABLET | Freq: Every day | ORAL | Status: DC

## 2012-08-16 MED ORDER — SERTRALINE HCL 25 MG PO TABS
25.0000 mg | ORAL_TABLET | Freq: Every day | ORAL | Status: DC
  Filled 2012-08-16: qty 1

## 2012-08-16 MED ORDER — HEPARIN SODIUM (PORCINE) 5000 UNIT/ML IJ SOLN
5000.0000 [IU] | Freq: Three times a day (TID) | INTRAMUSCULAR | Status: DC
  Administered 2012-08-17 – 2012-08-22 (×17): 5000 [IU] via SUBCUTANEOUS
  Filled 2012-08-16 (×19): qty 1

## 2012-08-16 MED ORDER — SODIUM CHLORIDE 0.9 % IJ SOLN
3.0000 mL | Freq: Two times a day (BID) | INTRAMUSCULAR | Status: DC
  Administered 2012-08-17 – 2012-08-22 (×3): 3 mL via INTRAVENOUS

## 2012-08-16 MED ORDER — LEVOTHYROXINE SODIUM 88 MCG PO TABS
88.0000 ug | ORAL_TABLET | Freq: Every day | ORAL | Status: DC
  Administered 2012-08-20 – 2012-08-22 (×3): 88 ug via ORAL
  Filled 2012-08-16 (×7): qty 1

## 2012-08-16 MED ORDER — ASPIRIN EC 81 MG PO TBEC
81.0000 mg | DELAYED_RELEASE_TABLET | Freq: Every day | ORAL | Status: DC
  Filled 2012-08-16: qty 1

## 2012-08-16 MED ORDER — ISOSORBIDE MONONITRATE ER 30 MG PO TB24
30.0000 mg | ORAL_TABLET | Freq: Every day | ORAL | Status: DC
  Administered 2012-08-20 – 2012-08-22 (×3): 30 mg via ORAL
  Filled 2012-08-16 (×6): qty 1

## 2012-08-16 MED ORDER — FLUTICASONE FUROATE 27.5 MCG/SPRAY NA SUSP: 2.0000 | Freq: Every day | NASAL | Status: DC | PRN

## 2012-08-16 MED ORDER — POLYETHYLENE GLYCOL 3350 17 G PO PACK
17.0000 g | PACK | Freq: Every day | ORAL | Status: DC | PRN
  Filled 2012-08-16: qty 1

## 2012-08-16 MED ORDER — FLUTICASONE PROPIONATE 50 MCG/ACT NA SUSP: 2.0000 | Freq: Every day | NASAL | Status: DC | PRN

## 2012-08-16 MED ORDER — B COMPLEX VITAMINS PO CAPS: 1.0000 | ORAL_CAPSULE | Freq: Every day | ORAL | Status: DC

## 2012-08-16 MED ORDER — SODIUM CHLORIDE 0.9 % IV BOLUS (SEPSIS)
500.0000 mL | Freq: Once | INTRAVENOUS | Status: AC
  Administered 2012-08-16: 500 mL via INTRAVENOUS

## 2012-08-16 MED ORDER — MESALAMINE 1000 MG RE SUPP
1000.0000 mg | Freq: Two times a day (BID) | RECTAL | Status: DC
  Administered 2012-08-17 – 2012-08-22 (×11): 1000 mg via RECTAL
  Filled 2012-08-16 (×14): qty 1

## 2012-08-16 MED ORDER — SUCRALFATE 1 GM/10ML PO SUSP
1.0000 g | Freq: Four times a day (QID) | ORAL | Status: DC
  Administered 2012-08-19 – 2012-08-22 (×13): 1 g via ORAL
  Filled 2012-08-16 (×24): qty 10

## 2012-08-16 MED ORDER — SODIUM CHLORIDE 0.9 % IV SOLN
INTRAVENOUS | Status: DC
  Administered 2012-08-16 – 2012-08-17 (×2): via INTRAVENOUS

## 2012-08-16 MED ORDER — B COMPLEX-C PO TABS
1.0000 | ORAL_TABLET | Freq: Every day | ORAL | Status: DC
  Filled 2012-08-16: qty 1

## 2012-08-16 NOTE — ED Notes (Signed)
NS IVF bolus intitiated. VSS, no changes. Alert, NAD, calm, resps e/u, tracking. Family at [facility].

## 2012-08-16 NOTE — ED Notes (Addendum)
Per [facility], pt recently d/c'd from [facility] for "diverticulits" according to the family where she received 8 units of blood. Tonight family reported to EMS that pt was watching tv and sitting on the side of the bed, stiffened, eyes rolled back in her head and started convulsing which lasted for 1 min. VSS. Pt is normally demented but able to acknowledge the person and baseline aphasic. EMS reports pt had a mouthful of water upon arrival to scene after family stated that they were trying to give her something to drink and pt swallowed it per EMS.

## 2012-08-16 NOTE — ED Notes (Signed)
No changes, no sz activity noted.

## 2012-08-16 NOTE — ED Notes (Signed)
No changes, no sz activity.

## 2012-08-16 NOTE — ED Notes (Signed)
Pt in radiology 

## 2012-08-16 NOTE — H&P (Signed)
[facility] History and Physical  Amarissa Koerner RUE:[id number] DOB: 05/05/1917 DOA: 08/16/2012  Referring physician: ED PCP: Illene Regulus, MD  Specialists: None  Chief Complaint: Seizure  HPI: [name] is a 78 y.o. female who presents after having a single episode of seizure reported per family onset 1 hour ago.  This lasted between 30 and 120 seconds characterized by rhythmic jerking.  In the ED the patient was found to have a sodium of 114 down from 139 on 10/28.  Hospitalist has been asked to admit for hyponatremia with seizure.  Upon further discussion with the family it is revealed that the patient has been getting progressively worse the past 2 to 3 days and eating and drinking even less than usual.  Even at baseline she has severe dementia and eats/drinks very little.  Review of Systems: Not able to be obtained as patient dosent really talk even at baseline.  Past Medical History  Diagnosis Date  . Internal hemorrhoid   . Diverticulosis of colon (without mention of hemorrhage)   . Herpes zoster   . Glaucoma(365)   . Lumbar back pain   . Hypertension   . Cardiomyopathy   . Hypothyroid    Past Surgical History  Procedure Date  . Thyroidectomy   . Appendectomy   . Tubal ligation   . Colonoscopy 07/19/2012    Procedure: COLONOSCOPY;  Surgeon: Meryl Dare, MD,FACG;  Location: [facility];  Service: Endoscopy;  Laterality: N/A;   Social History:  reports that she has never smoked. She has never used smokeless tobacco. She reports that she does not drink alcohol or use illicit drugs.   No Known Allergies  History reviewed. No pertinent family history.  Prior to Admission medications   Medication Sig Start Date End Date Taking? Authorizing Provider  aspirin EC 81 MG tablet Take 81 mg by mouth daily.   Yes Historical Provider, MD  b complex vitamins capsule Take 1 capsule by mouth daily.     Yes Historical Provider, MD  fluticasone (VERAMYST) 27.5  MCG/SPRAY nasal spray Place 2 sprays into the nose daily as needed. For allergies   Yes Historical Provider, MD  isosorbide mononitrate (IMDUR) 30 MG 24 hr tablet Take 30 mg by mouth daily.   Yes Historical Provider, MD  levothyroxine (SYNTHROID, LEVOTHROID) 88 MCG tablet TAKE 1 TABLET BY MOUTH ONCE A DAY 03/05/12  Yes Jacques Navy, MD  lisinopril-hydrochlorothiazide (PRINZIDE,ZESTORETIC) 10-12.5 MG per tablet TAKE 1 TABLET BY MOUTH DAILY. 07/25/12  Yes Jacques Navy, MD  mesalamine (CANASA) 1000 MG suppository Place 1 suppository (1,000 mg total) rectally 2 (two) times daily. 07/23/12  Yes Jacques Navy, MD  nitroGLYCERIN (NITROSTAT) 0.4 MG SL tablet Place 1 tablet (0.4 mg total) under the tongue every 5 (five) minutes as needed. 02/06/12  Yes Jacques Navy, MD  pantoprazole (PROTONIX) 40 MG tablet TAKE 1 TABLET BY MOUTH DAILY 04/08/12  Yes Jacques Navy, MD  polyethylene glycol (MIRALAX / GLYCOLAX) packet Take 17 g by mouth daily as needed. For constipation   Yes Historical Provider, MD  sertraline (ZOLOFT) 25 MG tablet Take 25 mg by mouth daily.   Yes Historical Provider, MD  sucralfate (CARAFATE) 1 GM/10ML suspension Take 1 g by mouth 4 (four) times daily.   Yes Historical Provider, MD   Physical Exam: Filed Vitals:   08/16/12 2045 08/16/12 2100 08/16/12 2115 08/16/12 2130  BP: 169/83 174/89 167/82 167/86  Pulse: 80 78 80 80  Temp:  TempSrc:      Resp: 17 20 20 18   SpO2: 100% 100% 100% 100%    General:  NAD, resting comfortably in bed, grossly cachetic Eyes: PEERLA EOMI ENT: mucous membranes moist temporal muscle wasting Neck: supple w/o JVD, diffuse muscle wasting Cardiovascular: RRR w/o MRG Respiratory: CTA B Abdomen: soft, nt, nd, bs+ Skin: no rash nor lesion Musculoskeletal: MAE, full ROM all 4 extremities Psychiatric: normal tone and affect Neurologic: wakes up, obeys commands, but non verbal  Labs on Admission:  Basic Metabolic Panel:  Lab 08/16/12 5284    NA 114*  K 4.7  CL 78*  CO2 25  GLUCOSE 150*  BUN 20  CREATININE 1.01  CALCIUM 9.7  MG --  PHOS --   Liver Function Tests: No results found for this basename: AST:5,ALT:5,ALKPHOS:5,BILITOT:5,PROT:5,ALBUMIN:5 in the last 168 hours No results found for this basename: LIPASE:5,AMYLASE:5 in the last 168 hours No results found for this basename: AMMONIA:5 in the last 168 hours CBC:  Lab 08/16/12 1921  WBC 5.3  NEUTROABS --  HGB 11.1*  HCT 31.7*  MCV 80.5  PLT 208   Cardiac Enzymes:  Lab 08/16/12 1921  CKTOTAL --  CKMB --  CKMBINDEX --  TROPONINI <0.30    BNP (last 3 results) No results found for this basename: PROBNP:3 in the last 8760 hours CBG: No results found for this basename: GLUCAP:5 in the last 168 hours  Radiological Exams on Admission: Dg Chest 1 View  08/16/2012  *RADIOLOGY REPORT*  Clinical Data: Seizure.  CHEST - 1 VIEW  Comparison: Two-view chest x-ray 07/25/2000, 12/14/2007.  Findings: Cardiac silhouette moderately enlarged but stable. Thoracic aorta tortuous and atherosclerotic, unchanged.  Hilar and mediastinal contours otherwise unremarkable.  Stable mild hyperinflation.  Lungs clear.  Bronchovascular markings normal. Pulmonary vascularity normal.  No pneumothorax.  No pleural effusions.  IMPRESSION: Stable cardiomegaly.  Hyperinflation consistent with COPD and/or asthma.  No acute cardiopulmonary disease.   Original Report Authenticated By: Hulan Saas, M.D.    Ct Head Wo Contrast  08/16/2012  *RADIOLOGY REPORT*  Clinical Data: New onset seizures.  CT HEAD WITHOUT CONTRAST  Technique:  Contiguous axial images were obtained from the base of the skull through the vertex without contrast.  Comparison: None.  Findings: Patient motion blurred some of the images but these were repeated and a diagnostic study was obtained.  Ventricular system normal in size and appearance for age.  Moderate to severe cortical atrophy and moderate to severe changes of small  vessel disease of the white matter diffusely.  Moderate cerebellar atrophy.  No mass lesion.  No midline shift.  No acute hemorrhage or hematoma.  No extra-axial fluid collections.  No evidence of acute infarction.  No skull fracture or other focal osseous abnormality involving the skull.  Visualized paranasal sinuses, bilateral mastoid air cells, and bilateral middle ear cavities well-aerated.  Extensive bilateral carotid siphon atherosclerosis.  IMPRESSION:  1.  No acute intracranial abnormality. 2.  Moderate to severe generalized atrophy and moderate to severe chronic microvascular ischemic changes of the white matter.   Original Report Authenticated By: Hulan Saas, M.D.     EKG: Independently reviewed.  Assessment/Plan Principal Problem:  *Adult failure to thrive Active Problems:  End of life care  Dehydration with hyponatremia  Seizure   1. Dehydration with hyponatremia - will rehydrate with NS initially, Q4H BMPs ordered, from clinical history provided by daughters the patients dehydration has gotten very bad, the daughters will try to give her water PO, but  she will often sit with the water in her mouth and not swallow for a while.  Specific gravity of urine was 1.016, checking urine sodium and urine osmolality as well. 2. Adult failure to thrive - examination of the patient reveals that the poor po intake almost goes certianly beyond the past few days, she is cachetic and emaciated in appearance, with frank muscle wasting noted on exam.  The daughters confirm that despite them trying to get her to eat more she will only "nibble" on food.  She may well be a hospice candidate given this. 3. End of life care - patient is already DNI/DNR, while I will rehydrate her during this hospital stay, I have mentioned to the daughters that they need to think long and hard wether the patient would want a feeding tube.  Alternatively, I noted to them that if they did not choose a feeding tube that I  thought the patient likely would likely be a candidate for hospice care. 4. Seizure - single reported seizure episode, a clear cause with her profound hyponatremia is present, will treat hyponatremia, patient is currently back to baseline mental status, putting patient on seizure precautions, EEG ordered, will hold off on ordering Neurology consultation as I am fairly confident that this seizure was a direct consequence of her new and profound hyponatremia.  Certianly if she has another seizure episode I would want to get neurology involved.  No consults obtained.  Code Status: DNR/DNI (must indicate code status--if unknown or must be presumed, indicate so) Family Communication: Spoke with daughters at bedside (indicate person spoken with, if applicable, with phone number if by telephone) Disposition Plan: Admit to inpatient, Dr. Debby Bud service (indicate anticipated LOS)  Time spent: 70 min  Cathy Crounse M. [facility] Pager [phone]  If 7PM-7AM, please contact night-coverage [email or web address] Password [facility] 08/16/2012, 9:58 PM

## 2012-08-16 NOTE — ED Provider Notes (Signed)
History     CSN: [id number]  Arrival date & time 08/16/12  1840   First MD Initiated Contact with Patient 08/16/12 1841      Chief Complaint  Patient presents with  . Seizures    (Consider location/radiation/quality/duration/timing/severity/associated sxs/prior treatment) Patient is a 77 y.o. female presenting with seizures. The history is provided by the patient.  Seizures  This is a new problem. The current episode started less than 1 hour ago. The problem has not changed since onset.There was 1 seizure. The most recent episode lasted 30 to 120 seconds. Pertinent negatives include no chest pain, no cough, no vomiting and no diarrhea. Characteristics include eye deviation (Back into her head) and rhythmic jerking. Characteristics do not include eye blinking, bowel incontinence, bladder incontinence, loss of consciousness, bit tongue, apnea or cyanosis. The episode was witnessed. The seizures did not continue in the ED. The seizure(s) had no focality. There has been no fever.    Past Medical History  Diagnosis Date  . Internal hemorrhoid   . Diverticulosis of colon (without mention of hemorrhage)   . Herpes zoster   . Glaucoma(365)   . Lumbar back pain   . Hypertension   . Cardiomyopathy   . Hypothyroid     Past Surgical History  Procedure Date  . Thyroidectomy   . Appendectomy   . Tubal ligation   . Colonoscopy 07/19/2012    Procedure: COLONOSCOPY;  Surgeon: Meryl Dare, MD,FACG;  Location: [facility];  Service: Endoscopy;  Laterality: N/A;    History reviewed. No pertinent family history.  History  Substance Use Topics  . Smoking status: Never Smoker   . Smokeless tobacco: Never Used  . Alcohol Use: No    OB History    Grav Para Term Preterm Abortions TAB SAB Ect Mult Living                  Review of Systems  Constitutional: Negative for fever and chills.  Respiratory: Negative for apnea and cough.   Cardiovascular: Negative for chest pain and  cyanosis.  Gastrointestinal: Negative for vomiting, diarrhea and bowel incontinence.  Genitourinary: Negative for bladder incontinence.  Neurological: Positive for seizures. Negative for loss of consciousness.  All other systems reviewed and are negative.    Allergies  Review of patient's allergies indicates no known allergies.  Home Medications   Current Outpatient Rx  Name  Route  Sig  Dispense  Refill  . ASPIRIN EC 81 MG PO TBEC   Oral   Take 81 mg by mouth daily.         . B COMPLEX VITAMINS PO CAPS   Oral   Take 1 capsule by mouth daily.           Marland Kitchen FLUTICASONE FUROATE 27.5 MCG/SPRAY NA SUSP   Nasal   Place 2 sprays into the nose daily as needed. For allergies         . HYDROCODONE-ACETAMINOPHEN 5-325 MG PO TABS   Oral   Take 1 tablet by mouth every 6 (six) hours as needed for pain.   6 tablet   0   . ISOSORBIDE MONONITRATE ER 30 MG PO TB24   Oral   Take 30 mg by mouth daily.         Marland Kitchen LEVOTHYROXINE SODIUM 88 MCG PO TABS      TAKE 1 TABLET BY MOUTH ONCE A DAY   30 tablet   5   . LISINOPRIL-HYDROCHLOROTHIAZIDE 10-12.5 MG PO TABS  TAKE 1 TABLET BY MOUTH DAILY.   30 tablet   10   . MESALAMINE 1000 MG RE SUPP   Rectal   Place 1 suppository (1,000 mg total) rectally 2 (two) times daily.   30 suppository   1   . NITROGLYCERIN 0.4 MG SL SUBL   Sublingual   Place 1 tablet (0.4 mg total) under the tongue every 5 (five) minutes as needed.   30 tablet   5   . ONDANSETRON 8 MG PO TBDP   Oral   Take 8 mg by mouth every 8 (eight) hours as needed. 8mg  ODT q4 hours prn nausea         . PANTOPRAZOLE SODIUM 40 MG PO TBEC      TAKE 1 TABLET BY MOUTH DAILY   30 tablet   5   . SERTRALINE HCL 25 MG PO TABS   Oral   Take 25 mg by mouth daily.         . SERTRALINE HCL 25 MG PO TABS      TAKE 1 TABLET BY MOUTH DAILY   30 tablet   10   . SUCRALFATE 1 GM/10ML PO SUSP   Oral   Take 1 g by mouth 4 (four) times daily.           BP 166/91   Pulse 72  Temp 98 F (36.7 C) (Oral)  Resp 24  SpO2 100%  Physical Exam  Nursing note and vitals reviewed. Constitutional: She appears well-nourished. No distress.       Cachectic  HENT:  Head: Normocephalic and atraumatic.  Eyes: EOM are normal. Pupils are equal, round, and reactive to light.  Neck: Normal range of motion. Neck supple.  Cardiovascular: Normal rate and regular rhythm.  Exam reveals no friction rub.   No murmur heard. Pulmonary/Chest: Effort normal and breath sounds normal. No respiratory distress. She has no wheezes. She has no rales.  Abdominal: Soft. She exhibits no distension. There is no tenderness. There is no rebound.  Musculoskeletal: Normal range of motion. She exhibits no edema.  Neurological: She is alert. No cranial nerve deficit. She exhibits normal muscle tone.       Aphasic. This is her baseline. No seizure activity noted.  Skin: She is not diaphoretic.    ED Course  Procedures (including critical care time)  Labs Reviewed  CBC - Abnormal; Notable for the following:    Hemoglobin 11.1 (*)     HCT 31.7 (*)     All other components within normal limits  BASIC METABOLIC PANEL - Abnormal; Notable for the following:    Sodium 114 (*)     Chloride 78 (*)     Glucose, Bld 150 (*)     GFR calc non Af Amer 46 (*)     GFR calc Af Amer 54 (*)     All other components within normal limits  CBC - Abnormal; Notable for the following:    RBC 3.53 (*)     Hemoglobin 9.6 (*)     HCT 27.9 (*)     All other components within normal limits  BASIC METABOLIC PANEL - Abnormal; Notable for the following:    Sodium 114 (*)     Chloride 81 (*)     Glucose, Bld 109 (*)     GFR calc non Af Amer 57 (*)     GFR calc Af Amer 66 (*)     All other components within normal limits  URINALYSIS, ROUTINE W REFLEX MICROSCOPIC  TROPONIN I  LACTIC ACID, PLASMA  CBC  BASIC METABOLIC PANEL  BASIC METABOLIC PANEL  BASIC METABOLIC PANEL  BASIC METABOLIC PANEL  BASIC  METABOLIC PANEL  SODIUM, URINE, RANDOM  OSMOLALITY, URINE   Dg Chest 1 View  08/16/2012  *RADIOLOGY REPORT*  Clinical Data: Seizure.  CHEST - 1 VIEW  Comparison: Two-view chest x-ray 07/25/2000, 12/14/2007.  Findings: Cardiac silhouette moderately enlarged but stable. Thoracic aorta tortuous and atherosclerotic, unchanged.  Hilar and mediastinal contours otherwise unremarkable.  Stable mild hyperinflation.  Lungs clear.  Bronchovascular markings normal. Pulmonary vascularity normal.  No pneumothorax.  No pleural effusions.  IMPRESSION: Stable cardiomegaly.  Hyperinflation consistent with COPD and/or asthma.  No acute cardiopulmonary disease.   Original Report Authenticated By: Hulan Saas, M.D.    Ct Head Wo Contrast  08/16/2012  *RADIOLOGY REPORT*  Clinical Data: New onset seizures.  CT HEAD WITHOUT CONTRAST  Technique:  Contiguous axial images were obtained from the base of the skull through the vertex without contrast.  Comparison: None.  Findings: Patient motion blurred some of the images but these were repeated and a diagnostic study was obtained.  Ventricular system normal in size and appearance for age.  Moderate to severe cortical atrophy and moderate to severe changes of small vessel disease of the white matter diffusely.  Moderate cerebellar atrophy.  No mass lesion.  No midline shift.  No acute hemorrhage or hematoma.  No extra-axial fluid collections.  No evidence of acute infarction.  No skull fracture or other focal osseous abnormality involving the skull.  Visualized paranasal sinuses, bilateral mastoid air cells, and bilateral middle ear cavities well-aerated.  Extensive bilateral carotid siphon atherosclerosis.  IMPRESSION:  1.  No acute intracranial abnormality. 2.  Moderate to severe generalized atrophy and moderate to severe chronic microvascular ischemic changes of the white matter.   Original Report Authenticated By: Hulan Saas, M.D.     Date: 08/17/2012  Rate: 71   Rhythm: normal sinus rhythm  QRS Axis: normal  Intervals: QT prolonged  ST/T Wave abnormalities: flipped T waves inferiorly, anteriorly  Conduction Disutrbances:none  Narrative Interpretation:   Old EKG Reviewed: changes noted and  - T wave changed    Impression: 1. Hyponatremia 2. Seizure 3. Dehydration 4. Hypochloremia   MDM   77 year old female with history of hypertension, cardiomyopathy, recent admission for diverticular bleed presents with a 1 minute long episode where her eyes will back in her head and she had generalized shaking. No family is present for history. EMS reported poor history from the family. Is unclear whether this is a seizure versus syncope. Patient does not have any history of seizures. She had a normal glucose in the 130s with EMS. Here vitals are stable. She opens her eyes is alert. She follows commands, however is aphasic. She is aphasic at baseline. Her family stated she is at her baseline when they transferred her from her house to the hospital. [facility] perform a broad workup including CT head, labs, EKG, chest x-ray for her possible seizure versus syncope. Patient's labs show severe hyponatremia.  patient is severely hydrated making this hyponatremia likely due to hypovolemia. patient's seizure was probably due to her hyponatremia. Patient continued on normal saline for rehydration. No percent saline initiated at this time. Patient admitted to medicine for further hydration and further workup.   Elwin Mocha, MD 08/17/12 0005  Elwin Mocha, MD 08/17/12 [phone]

## 2012-08-16 NOTE — ED Notes (Signed)
Family arrived to [facility], requesting advance directive, pt remains in CT.

## 2012-08-16 NOTE — ED Notes (Signed)
Notified of Low sodium result, EDP made aware.

## 2012-08-16 NOTE — ED Notes (Addendum)
Recent DNR with last hospitalization, but did want medical care, per PCP H&P 08-01-2012, (Dr. Debby Bud).

## 2012-08-16 NOTE — ED Notes (Signed)
No changes, no sz activity noted.  

## 2012-08-16 NOTE — ED Notes (Signed)
Admitting Md at [facility] with family.

## 2012-08-17 ENCOUNTER — Inpatient Hospital Stay (HOSPITAL_COMMUNITY): Payer: Medicare HMO

## 2012-08-17 ENCOUNTER — Encounter (HOSPITAL_COMMUNITY): Payer: Self-pay | Admitting: *Deleted

## 2012-08-17 DIAGNOSIS — R569 Unspecified convulsions: Secondary | ICD-10-CM | POA: Diagnosis present

## 2012-08-17 DIAGNOSIS — E871 Hypo-osmolality and hyponatremia: Principal | ICD-10-CM

## 2012-08-17 DIAGNOSIS — R627 Adult failure to thrive: Secondary | ICD-10-CM

## 2012-08-17 LAB — BASIC METABOLIC PANEL
CO2: 24 mEq/L (ref 19–32)
CO2: 23 mEq/L (ref 19–32)
Chloride: 83 mEq/L — ABNORMAL LOW (ref 96–112)
GFR calc Af Amer: 70 mL/min — ABNORMAL LOW (ref >90)
Glucose, Bld: 85 mg/dL (ref 70–99)
Potassium: 4.1 mEq/L (ref 3.5–5.1)
Potassium: 4 mEq/L (ref 3.5–5.1)
Sodium: 118 mEq/L — CL (ref 135–145)
Sodium: 117 mEq/L — CL (ref 135–145)

## 2012-08-17 LAB — TSH: TSH: 1.786 u[IU]/mL (ref 0.350–4.500)

## 2012-08-17 LAB — CBC
HCT: 30.9 % — ABNORMAL LOW (ref 36.0–46.0)
Platelets: 203 10*3/uL (ref 150–400)
RDW: 14 % (ref 11.5–15.5)
WBC: 6 10*3/uL (ref 4.0–10.5)

## 2012-08-17 MED ORDER — PANTOPRAZOLE SODIUM 40 MG IV SOLR
40.0000 mg | Freq: Every day | INTRAVENOUS | Status: DC
  Administered 2012-08-17 (×2): 40 mg via INTRAVENOUS
  Filled 2012-08-17 (×3): qty 40

## 2012-08-17 MED ORDER — HYDRALAZINE HCL 20 MG/ML IJ SOLN
5.0000 mg | Freq: Four times a day (QID) | INTRAMUSCULAR | Status: DC | PRN
  Administered 2012-08-17: 5 mg via INTRAVENOUS
  Filled 2012-08-17: qty 0.25

## 2012-08-17 NOTE — Progress Notes (Signed)
Dr.Grifiin (on call for Dr.Norins) called with Na level of 117. No new orders @ this time.

## 2012-08-17 NOTE — Progress Notes (Signed)
EEG completed at bedside as ordered °

## 2012-08-17 NOTE — Procedures (Signed)
History: 78 yo F with seizure in the setting of hyponatremia.   Sedation: None  Background: There is a well defined posterior dominant rhythm of 8.5 Hz that attenuates with eye opening.   Photic stimulation: Physiologic driving is present  EEG Diagnosis: 1) Normal EEG  Clinical Interpretation: This normal EEG is recorded in the waking and sleep state. There was no seizure or seizure predisposition recorded on this study.   Ritta Slot, MD [facility] [phone]  If 7pm- 7am, please page neurology on call at [phone].

## 2012-08-17 NOTE — Progress Notes (Signed)
Subjective: Sleeping comfortably  Objective: Vital signs in last 24 hours: Temp:  [97.5 F (36.4 C)-98.7 F (37.1 C)] 98.1 F (36.7 C) (11/30 0940) Pulse Rate:  [60-87] 60  (11/30 0940) Resp:  [16-24] 16  (11/30 0940) BP: (112-183)/(46-95) 112/46 mmHg (11/30 0940) SpO2:  [98 %-100 %] 100 % (11/30 0940) Weight:  [38.102 kg (84 lb)] 38.102 kg (84 lb) (11/29 2310) Weight change:  Last BM Date:  (unk)  Intake/Output from previous day: 11/29 0701 - 11/30 0700 In: 680 [I.V.:680] Out: -  Intake/Output this shift:    Resp: clear to auscultation bilaterally Cardio: regular rate and rhythm, S1, S2 normal, no murmur, click, rub or gallop Extremities: extremities normal, atraumatic, no cyanosis or edema  Lab Results:  [facility] 08/17/12 0216 08/16/12 2208  WBC 6.0 5.9  HGB 10.9* 9.6*  HCT 30.9* 27.9*  PLT 203 200   BMET  Basename 08/17/12 0942 08/17/12 0215  NA 117* 118*  K 4.0 4.1  CL 87* 83*  CO2 23 24  GLUCOSE 85 106*  BUN 17 17  CREATININE 0.91 0.81  CALCIUM 8.6 9.4    Studies/Results: Dg Chest 1 View  08/16/2012  *RADIOLOGY REPORT*  Clinical Data: Seizure.  CHEST - 1 VIEW  Comparison: Two-view chest x-ray 07/25/2000, 12/14/2007.  Findings: Cardiac silhouette moderately enlarged but stable. Thoracic aorta tortuous and atherosclerotic, unchanged.  Hilar and mediastinal contours otherwise unremarkable.  Stable mild hyperinflation.  Lungs clear.  Bronchovascular markings normal. Pulmonary vascularity normal.  No pneumothorax.  No pleural effusions.  IMPRESSION: Stable cardiomegaly.  Hyperinflation consistent with COPD and/or asthma.  No acute cardiopulmonary disease.   Original Report Authenticated By: Hulan Saas, M.D.    Ct Head Wo Contrast  08/16/2012  *RADIOLOGY REPORT*  Clinical Data: New onset seizures.  CT HEAD WITHOUT CONTRAST  Technique:  Contiguous axial images were obtained from the base of the skull through the vertex without contrast.  Comparison: None.   Findings: Patient motion blurred some of the images but these were repeated and a diagnostic study was obtained.  Ventricular system normal in size and appearance for age.  Moderate to severe cortical atrophy and moderate to severe changes of small vessel disease of the white matter diffusely.  Moderate cerebellar atrophy.  No mass lesion.  No midline shift.  No acute hemorrhage or hematoma.  No extra-axial fluid collections.  No evidence of acute infarction.  No skull fracture or other focal osseous abnormality involving the skull.  Visualized paranasal sinuses, bilateral mastoid air cells, and bilateral middle ear cavities well-aerated.  Extensive bilateral carotid siphon atherosclerosis.  IMPRESSION:  1.  No acute intracranial abnormality. 2.  Moderate to severe generalized atrophy and moderate to severe chronic microvascular ischemic changes of the white matter.   Original Report Authenticated By: Hulan Saas, M.D.     Medications: I have reviewed the patient's current medications.  Assessment/Plan: Principal Problem:  *Adult failure to thrive NCB.  Family considering feeding tube Active Problems:  HYPERTENSION ok BP meds on hold  End of life care  Dehydration with hyponatremia IV NS.  Hold hctz and sertraline.  Check TSH.  Seizure likely secondary to hyponatremia or dementia.  EEG done, pending   LOS: 1 day   Calvert Charland JOSEPH 08/17/2012, 10:51 AM

## 2012-08-17 NOTE — ED Provider Notes (Signed)
  I performed a history and physical examination of [name] and discussed her management with Dr. Gwendolyn Grant.  I agree with the history, physical, assessment, and plan of care, with the following exceptions: None  On my exam the patient was actively seizing, but clearly incapable of providing any significant details of the history of present illness.  Given history of a seizure, the patient's obvious clinical dehydration, there suspicion for a very thrive with consideration of electrode abnormalities, sepsis.  The patient's evaluation is most notable for demonstration of profound hyponatremia.  Correction of this was initiated in the emergency department with saline.  Given the patient's age, comorbidities, the hospitalist service discussed goals of care with the patient's family.  CRITICAL CARE Performed by: Gerhard Munch   Total critical care time: 35  Critical care time was exclusive of separately billable procedures and treating other patients.  Critical care was necessary to treat or prevent imminent or life-threatening deterioration.  Critical care was time spent personally by me on the following activities: development of treatment plan with patient and/or surrogate as well as nursing, discussions with consultants, evaluation of patient's response to treatment, examination of patient, obtaining history from patient or surrogate, ordering and performing treatments and interventions, ordering and review of laboratory studies, ordering and review of radiographic studies, pulse oximetry and re-evaluation of patient's condition.   Elyse Jarvis, MD 08/17/12 [phone]

## 2012-08-18 LAB — BASIC METABOLIC PANEL
CO2: 19 mEq/L (ref 19–32)
Chloride: 95 mEq/L — ABNORMAL LOW (ref 96–112)
Glucose, Bld: 53 mg/dL — ABNORMAL LOW (ref 70–99)
Potassium: 3.8 mEq/L (ref 3.5–5.1)
Sodium: 125 mEq/L — ABNORMAL LOW (ref 135–145)

## 2012-08-18 MED ORDER — PANTOPRAZOLE SODIUM 40 MG PO TBEC
40.0000 mg | DELAYED_RELEASE_TABLET | Freq: Every day | ORAL | Status: DC
Start: 2012-08-18 — End: 2012-08-22
  Administered 2012-08-20 – 2012-08-22 (×3): 40 mg via ORAL
  Filled 2012-08-18: qty 1

## 2012-08-18 MED ORDER — HYDRALAZINE HCL 20 MG/ML IJ SOLN
5.0000 mg | Freq: Three times a day (TID) | INTRAMUSCULAR | Status: DC | PRN
  Administered 2012-08-18 – 2012-08-20 (×3): 5 mg via INTRAVENOUS
  Filled 2012-08-18 (×3): qty 0.25

## 2012-08-18 MED ORDER — DEXTROSE-NACL 5-0.9 % IV SOLN
INTRAVENOUS | Status: DC
  Administered 2012-08-18 – 2012-08-21 (×6): via INTRAVENOUS

## 2012-08-18 NOTE — Progress Notes (Signed)
SLP Cancellation Note  Patient Details Name: Denys Labree MRN: [id number] DOB: 25-Mar-1917   Cancelled treatment:    Received order for BSE.  RN confirmed with MD to defer evaluation to 08/19/12 due to decreased LOA. ST to follow.       Moreen Fowler M.S., CCC-SLP [id number] [facility] 08/18/2012, 11:30 AM

## 2012-08-18 NOTE — Progress Notes (Signed)
Subjective: Was awake and talking yesterday, Sleeping this am.  Incontinent of urine, not so at home (UA neg).  Has tendency to clear throat while eating, po intake at home poor.  Objective: Vital signs in last 24 hours: Temp:  [97.2 F (36.2 C)-98.5 F (36.9 C)] 97.8 F (36.6 C) (12/01 0918) Pulse Rate:  [60-99] 83  (12/01 0918) Resp:  [14-20] 18  (12/01 0918) BP: (112-184)/(46-84) 170/84 mmHg (12/01 0918) SpO2:  [99 %-100 %] 100 % (12/01 0918) Weight:  [40.824 kg (90 lb)] 40.824 kg (90 lb) (11/30 2100) Weight change: 2.722 kg (6 lb) Last BM Date:  (unk)  Intake/Output from previous day: 11/30 0701 - 12/01 0700 In: 1210 [I.V.:1200; IV Piggyback:10] Out: -  Intake/Output this shift:    General appearance: slowed mentation Resp: clear to auscultation bilaterally Cardio: regular rate and rhythm, S1, S2 normal, no murmur, click, rub or gallop Extremities: extremities normal, atraumatic, no cyanosis or edema  Lab Results:  [facility] 08/17/12 0216 08/16/12 2208  WBC 6.0 5.9  HGB 10.9* 9.6*  HCT 30.9* 27.9*  PLT 203 200   BMET  Basename 08/18/12 0620 08/17/12 0942  NA 125* 117*  K 3.8 4.0  CL 95* 87*  CO2 19 23  GLUCOSE 53* 85  BUN 18 17  CREATININE 0.88 0.91  CALCIUM 8.4 8.6    Studies/Results: Dg Chest 1 View  08/16/2012  *RADIOLOGY REPORT*  Clinical Data: Seizure.  CHEST - 1 VIEW  Comparison: Two-view chest x-ray 07/25/2000, 12/14/2007.  Findings: Cardiac silhouette moderately enlarged but stable. Thoracic aorta tortuous and atherosclerotic, unchanged.  Hilar and mediastinal contours otherwise unremarkable.  Stable mild hyperinflation.  Lungs clear.  Bronchovascular markings normal. Pulmonary vascularity normal.  No pneumothorax.  No pleural effusions.  IMPRESSION: Stable cardiomegaly.  Hyperinflation consistent with COPD and/or asthma.  No acute cardiopulmonary disease.   Original Report Authenticated By: Hulan Saas, M.D.    Ct Head Wo Contrast  08/16/2012   *RADIOLOGY REPORT*  Clinical Data: New onset seizures.  CT HEAD WITHOUT CONTRAST  Technique:  Contiguous axial images were obtained from the base of the skull through the vertex without contrast.  Comparison: None.  Findings: Patient motion blurred some of the images but these were repeated and a diagnostic study was obtained.  Ventricular system normal in size and appearance for age.  Moderate to severe cortical atrophy and moderate to severe changes of small vessel disease of the white matter diffusely.  Moderate cerebellar atrophy.  No mass lesion.  No midline shift.  No acute hemorrhage or hematoma.  No extra-axial fluid collections.  No evidence of acute infarction.  No skull fracture or other focal osseous abnormality involving the skull.  Visualized paranasal sinuses, bilateral mastoid air cells, and bilateral middle ear cavities well-aerated.  Extensive bilateral carotid siphon atherosclerosis.  IMPRESSION:  1.  No acute intracranial abnormality. 2.  Moderate to severe generalized atrophy and moderate to severe chronic microvascular ischemic changes of the white matter.   Original Report Authenticated By: Hulan Saas, M.D.     Medications: I have reviewed the patient's current medications.   Assessment/Plan: Principal Problem:  *Adult failure to thrive NCB. Family deciding about feeding tube  Active Problems:  HYPERTENSION BP labile,  meds on hold  Dehydration with hyponatremia, improving.  Continue IV NS at 75. Hold hctz and sertraline.TSH ok. Hypoglycemia this am (53), check IV to D5NS Seizure likely secondary to hyponatremia. EEG normal. ?Dysphagia, speech therapy bedside swallowing evaluation   LOS: 2 days  Dreux Mcgroarty JOSEPH 08/18/2012, 9:35 AM

## 2012-08-19 DIAGNOSIS — I10 Essential (primary) hypertension: Secondary | ICD-10-CM

## 2012-08-19 DIAGNOSIS — E860 Dehydration: Secondary | ICD-10-CM

## 2012-08-19 LAB — BASIC METABOLIC PANEL
BUN: 12 mg/dL (ref 6–23)
CO2: 22 mEq/L (ref 19–32)
Chloride: 94 mEq/L — ABNORMAL LOW (ref 96–112)
GFR calc non Af Amer: 70 mL/min — ABNORMAL LOW (ref >90)
Glucose, Bld: 128 mg/dL — ABNORMAL HIGH (ref 70–99)
Potassium: 3.4 mEq/L — ABNORMAL LOW (ref 3.5–5.1)
Sodium: 128 mEq/L — ABNORMAL LOW (ref 135–145)

## 2012-08-19 MED ORDER — ENSURE PUDDING PO PUDG
1.0000 | Freq: Three times a day (TID) | ORAL | Status: DC
  Administered 2012-08-19 – 2012-08-22 (×6): 1 via ORAL

## 2012-08-19 NOTE — Clinical Documentation Improvement (Signed)
BMI DOCUMENTATION CLARIFICATION QUERY  THIS DOCUMENT IS NOT A PERMANENT PART OF THE MEDICAL RECORD  TO RESPOND TO THE THIS QUERY, FOLLOW THE INSTRUCTIONS BELOW:  1. If needed, update documentation for the patient's encounter via the notes activity.  2. Access this query again and click edit on the In [employer].  3. After updating, or not, click F2 to complete all highlighted (required) fields concerning your review. Select "additional documentation in the medical record" OR "no additional documentation provided".  4. Click Sign note button.  5. The deficiency will fall out of your In Basket *Please let us know if you are not able to complete this workflow by phone or e-mail (listed below).          08/19/12  Dear Dr. Debby Bud Marton Redwood  In an effort to better capture your patient's severity of illness, reflect appropriate length of stay and utilization of resources, a review of the patient medical record has revealed the following indicators.    Based on your clinical judgment, please clarify and document in a progress note and/or discharge summary the clinical condition associated with the following supporting information:  In responding to this query please exercise your independent judgment.  The fact that a query is asked, does not imply that any particular answer is desired or expected.  Possible Clinical Conditions:  Underweight w/BMI= 15.9  Morbid Obesity W/ BMI=    Other condition  Cannot Clinically determine   Risk Factors: Sign & Symptoms: Weight: 84 Height  5'!" BMI= 15.9  Nutrition Note: NO NOTE  Progress note: "Adult failure to thrive - examination of the patient reveals that the poor po intake almost goes certianly beyond the past few days, she is cachetic and emaciated in appearance, with frank muscle wasting noted on exam.  The daughters confirm that despite them trying to get her to eat more she will only "nibble" on food.  She may well be a hospice  candidate given this" "po intake at home poor" "grossly cachetic"   Reviewed:  no additional documentation provided  Thank You,  Joanette Gula Delk RN,BSN  Clinical Documentation Specialist: [phone] Pager Health Information Management [place]

## 2012-08-19 NOTE — Progress Notes (Signed)
Subjective: Mrs. Moreland admitted with FTT and very poor PO intake: stopped eating and drinking. Her sodium was 114 at admission and there was report of seizure like activity. She has been gently hydrated and hyponatremia corrected. SLP eval reviewed: order entered for dysphagia I diet with thin liquids and nutrition requested for this evening.  Patient is awake and seems alert. She speaks very softly but is interested in eating and when asked if she was ":done" she said she wasn't.  Objective: Lab: Lab Results  Component Value Date   WBC 6.0 08/17/2012   HGB 10.9* 08/17/2012   HCT 30.9* 08/17/2012   MCV 79.4 08/17/2012   PLT 203 08/17/2012   BMET    Component Value Date/Time   NA 128* 08/19/2012 0630   K 3.4* 08/19/2012 0630   CL 94* 08/19/2012 0630   CO2 22 08/19/2012 0630   GLUCOSE 128* 08/19/2012 0630   BUN 12 08/19/2012 0630   CREATININE 0.78 08/19/2012 0630   CALCIUM 9.1 08/19/2012 0630   GFRNONAA 70* 08/19/2012 0630   GFRAA 81* 08/19/2012 0630     Imaging:  Scheduled Meds:   . heparin  5,000 Units Subcutaneous Q8H  . isosorbide mononitrate  30 mg Oral Daily  . levothyroxine  88 mcg Oral QAC breakfast  . mesalamine  1,000 mg Rectal BID  . pantoprazole  40 mg Oral Daily  . sodium chloride  3 mL Intravenous Q12H  . sucralfate  1 g Oral QID   Continuous Infusions:   . dextrose 5 % and 0.9% NaCl 75 mL/hr at 08/19/12 1759   PRN Meds:.fluticasone, hydrALAZINE, polyethylene glycol   Physical Exam: Filed Vitals:   08/19/12 1803  BP: 177/79  Pulse: 69  Temp:   Resp:    very frail looking AA woman  HEENT- Temporal wasting noted Cor - RRR Pulm - normal respirations Neuro - Awake, seems alert, phonation is very soft. Makes appropriate responses.      Assessment/Plan: 1. FTT - patient able to swallow, therefore will not consider enteral feeds at this time. Family member reports Home Health connections will provide up to 5 hrs per day of care per  [facility].  Plan Dysphagia I diet with thin liquids  Ensure pudding tid  Fluids of choice.  PT eval  Case manager to assist with [facility] - in home aide services  2. Anemia - Hgb was stable Nov 30  3. F/E/N - Na 128 - improved. Renal function is normal  Met with multiple family members, who were present during patient interview. Answered all their questions.  Illene Regulus [facility] (o) [phone]; (c) [phone] Call-grp - [facility]  Tele: [phone]  08/19/2012, 6:21 PM

## 2012-08-19 NOTE — Evaluation (Signed)
SLP has reviewed and agrees with student's note below.  Breck Coons [place].Ed [employer] [phone]  08/19/2012

## 2012-08-19 NOTE — Progress Notes (Signed)
Chart reviewed. Plan is to see patient this PM after SLP evaluation completed. Will meet with family this PM

## 2012-08-19 NOTE — Evaluation (Signed)
Clinical/Bedside Swallow Evaluation Patient Details  Name: [name] MRN: [id number] Date of Birth: 1916/09/26  Today's Date: 08/19/2012 Time: 0950-1009 SLP Time Calculation (min): 19 min  Past Medical History:  Past Medical History  Diagnosis Date  . Internal hemorrhoid   . Diverticulosis of colon (without mention of hemorrhage)   . Herpes zoster   . Glaucoma(365)   . Lumbar back pain   . Hypertension   . Cardiomyopathy   . Hypothyroid    Past Surgical History:  Past Surgical History  Procedure Date  . Thyroidectomy   . Appendectomy   . Tubal ligation   . Colonoscopy 07/19/2012    Procedure: COLONOSCOPY;  Surgeon: Meryl Dare, MD,FACG;  Location: [facility];  Service: Endoscopy;  Laterality: N/A;   HPI:  Pt. is a 78 y/o female with baseline dementia and decreased PO intake. H/x of diverticulosis if colon, HTN, hypothyroid, and herpes zoster. Ad[facility] on 11/29 for seizure and hyponatremia. Pt. husband reports she has begun holding food, not attempting to initiate swallow. CXR showed stable cardiomegaly, hyperinflation with COPD and/or asthma. No acute cariopulmonary disease. CT showed no acute, mod-severe generalized atrophy and mod-ever chronic microvascular ischemic changes of the white matter.    Assessment / Plan / Recommendation Clinical Impression  Pt. seen for bedside swallow evaluation, she is lethargic and distractible. Husband at bedside, present and supportive. Initially pt. required max verbal/tactile cues to open mouth and accept bolus (tsp sips water) resulting in moderate anterior loss. As SLP attempted multiple trials of water (teaspoon and cup) pt. began manipulating bolus (swishing) with periords of holding in oral cavity without attempts to transit bolus with verbal cues to expectorate. Pt. able to transit puree consistency with mild delays and with straw sips thin liquids approx. 50% of time. Oropharyngeal dysphagia likely due to acute illness  (seizure) as husband reported no difficulty masticating/swallowing prior to this event.  Suspect pt. may not initially meet nutritional needs but intake will hopefully return to baseline when/if medical status improves.  SLP educated husband on how dementia diagnosis may negatively impact oropharyngeal swallow in the future and potential outcomes . Recommend Dys. 1 diet with thin liquids via straw, eat only when full alert/awake . Will continue to follow.     Aspiration Risk  Moderate    Diet Recommendation Dysphagia 1 (Puree);Thin liquid   Liquid Administration via: Cup;Straw Medication Administration: Crushed with puree Supervision: Full supervision/cueing for compensatory strategies;Staff feed patient Compensations: Check for anterior loss;Check for pocketing;Slow rate;Small sips/bites Postural Changes and/or Swallow Maneuvers: Seated upright 90 degrees    Other  Recommendations Oral Care Recommendations: Oral care BID   Follow Up Recommendations  Home health SLP    Frequency and Duration min 2x/week  2 weeks       SLP Swallow Goals Patient will consume recommended diet without observed clinical signs of aspiration with: Maximum assistance Patient will utilize recommended strategies during swallow to increase swallowing safety with: Maximal cueing   Swallow Study Prior Functional Status       General Date of Onset: 08/16/12 HPI: Pt. is a 78 y/o female with baseline dementia and decreased PO intake. H/x of diverticulosis if colon, HTN, hypothyroid, and herpes zoster. Admitted to [facility] on 11/29 for seizure and hyponatremia. Pt. husband reports she has begun holding food, not attempting to initiate swallow. CXR showed stable cardiomegaly, hyperinflation with COPD and/or asthma. No acute cariopulmonary disease. CT showed no acute, mod-severe generalized atrophy and mod-ever chronic microvascular ischemic changes of  the white matter.  Type of Study: Bedside swallow evaluation Diet Prior  to this Study: NPO Temperature Spikes Noted: No Respiratory Status: Supplemental O2 delivered via (comment) History of Recent Intubation: No Behavior/Cognition: Lethargic;Distractible;Requires cueing;Decreased sustained attention Oral Cavity - Dentition: Edentulous Self-Feeding Abilities: Needs assist;Total assist Patient Positioning: Upright in bed Baseline Vocal Quality: Hoarse;Low vocal intensity Volitional Cough: Weak Volitional Swallow: Able to elicit (extremely weak)    Oral/Motor/Sensory Function Overall Oral Motor/Sensory Function: Impaired Labial ROM: Within Functional Limits Labial Symmetry: Within Functional Limits Labial Strength: Reduced Labial Sensation: Reduced Lingual ROM:  (reduced) Lingual Symmetry: Within Functional Limits Lingual Strength: Reduced Lingual Sensation: Reduced Facial ROM: Within Functional Limits Facial Symmetry: Within Functional Limits Facial Strength: Reduced Facial Sensation: Reduced Velum:  (impaird, reduced elevation) Mandible: Within Functional Limits   Ice Chips Ice chips: Not tested   Thin Liquid Thin Liquid: Impaired Presentation: Cup;Spoon;Straw Oral Phase Impairments: Reduced labial seal;Poor awareness of bolus;Impaired anterior to posterior transit;Reduced lingual movement/coordination Oral Phase Functional Implications: Right anterior spillage;Left anterior spillage;Oral holding Pharyngeal  Phase Impairments: Suspected delayed Swallow;Decreased hyoid-laryngeal movement;Unable to trigger swallow    Nectar Thick Nectar Thick Liquid: Not tested   Honey Thick Honey Thick Liquid: Not tested   Puree Puree: Impaired Presentation: Spoon Oral Phase Impairments: Reduced labial seal;Reduced lingual movement/coordination;Impaired anterior to posterior transit Oral Phase Functional Implications: Oral residue;Prolonged oral transit Pharyngeal Phase Impairments: Suspected delayed Swallow;Decreased hyoid-laryngeal movement   Solid   GO     Solid: Not tested       Theotis Burrow 08/19/2012,2:06 PM

## 2012-08-20 DIAGNOSIS — J309 Allergic rhinitis, unspecified: Secondary | ICD-10-CM

## 2012-08-20 LAB — BASIC METABOLIC PANEL
BUN: 6 mg/dL (ref 6–23)
CO2: 24 mEq/L (ref 19–32)
Chloride: 98 mEq/L (ref 96–112)
Creatinine, Ser: 0.68 mg/dL (ref 0.50–1.10)
GFR calc Af Amer: 85 mL/min — ABNORMAL LOW (ref >90)
Potassium: 3.5 mEq/L (ref 3.5–5.1)

## 2012-08-20 LAB — HEMOGLOBIN AND HEMATOCRIT, BLOOD
HCT: 33.1 % — ABNORMAL LOW (ref 36.0–46.0)
Hemoglobin: 11.5 g/dL — ABNORMAL LOW (ref 12.0–15.0)

## 2012-08-20 MED ORDER — ENSURE COMPLETE PO LIQD
237.0000 mL | ORAL | Status: DC
  Administered 2012-08-20 – 2012-08-22 (×2): 237 mL via ORAL

## 2012-08-20 MED ORDER — FLUTICASONE PROPIONATE 50 MCG/ACT NA SUSP
2.0000 | Freq: Every day | NASAL | Status: DC
  Administered 2012-08-20 – 2012-08-22 (×3): 2 via NASAL
  Filled 2012-08-20: qty 16

## 2012-08-20 NOTE — Progress Notes (Signed)
INITIAL ADULT NUTRITION ASSESSMENT Date: 08/20/2012   Time: 11:41 AM  Reason for Assessment: Low Braden  INTERVENTION: 1. Add Ensure Complete daily for variety per discussion with family 2. RD provided handouts per family request on pureed foods, high-calorie/high-protein recipes, and tips for weight gain. 3. RD to continue to follow nutrition careplan  DOCUMENTATION CODES Per approved criteria  -Severe malnutrition in the context of chronic illness -Underweight    ASSESSMENT: Female 78 y.o.  Dx: Adult failure to thrive  Hx:  Past Medical History  Diagnosis Date  . Internal hemorrhoid   . Diverticulosis of colon (without mention of hemorrhage)   . Herpes zoster   . Glaucoma(365)   . Lumbar back pain   . Hypertension   . Cardiomyopathy   . Hypothyroid    Past Surgical History  Procedure Date  . Thyroidectomy   . Appendectomy   . Tubal ligation   . Colonoscopy 07/19/2012    Procedure: COLONOSCOPY;  Surgeon: Meryl Dare, MD,FACG;  Location: [facility];  Service: Endoscopy;  Laterality: N/A;   Related Meds:     . feeding supplement  1 Container Oral TID BM  . fluticasone  2 spray Each Nare Daily  . heparin  5,000 Units Subcutaneous Q8H  . isosorbide mononitrate  30 mg Oral Daily  . levothyroxine  88 mcg Oral QAC breakfast  . mesalamine  1,000 mg Rectal BID  . pantoprazole  40 mg Oral Daily  . sodium chloride  3 mL Intravenous Q12H  . sucralfate  1 g Oral QID   Ht: 5\' 1"  (154.9 cm)  Wt: 87 lb 4.8 oz (39.6 kg)  Ideal Wt: 105 lb/47.7 kg % Ideal Wt: 83%  Wt Readings from Last 15 Encounters:  08/18/12 87 lb 4.8 oz (39.6 kg)  08/01/12 80 lb 1.3 oz (36.324 kg)  07/16/12 94 lb (42.638 kg)  07/16/12 94 lb (42.638 kg)  03/13/12 86 lb (39.009 kg)  12/12/11 87 lb (39.463 kg)  08/21/11 85 lb (38.556 kg)  06/27/11 87 lb (39.463 kg)  06/01/11 85 lb (38.556 kg)  08/03/10 86 lb (39.009 kg)  12/24/07 94 lb (42.638 kg)  10/17/06 104 lb (47.174 kg)  Usual Wt: 85  - 87 lb % Usual Wt: 100%  Body mass index is 16.50 kg/(m^2). Underweight  Labs:  CMP     Component Value Date/Time   NA 130* 08/20/2012 0615   K 3.5 08/20/2012 0615   CL 98 08/20/2012 0615   CO2 24 08/20/2012 0615   GLUCOSE 126* 08/20/2012 0615   BUN 6 08/20/2012 0615   CREATININE 0.68 08/20/2012 0615   CALCIUM 9.3 08/20/2012 0615   PROT 6.9 07/15/2012 2230   ALBUMIN 3.7 07/15/2012 2230   AST 29 07/15/2012 2230   ALT 18 07/15/2012 2230   ALKPHOS 73 07/15/2012 2230   BILITOT 0.3 07/15/2012 2230   GFRNONAA 73* 08/20/2012 0615   GFRAA 85* 08/20/2012 0615   No results found for this basename: phos   No results found for this basename: mg    Intake/Output Summary (Last 24 hours) at 08/20/12 1141 Last data filed at 08/20/12 0906  Gross per 24 hour  Intake    725 ml  Output    200 ml  Net    525 ml  BM 12/1  Diet Order: Dysphagia 1 with thin liquids  Supplements/Tube Feeding: Ensure Pudding PO TID  IVF:     dextrose 5 % and 0.9% NaCl Last Rate: 75 mL/hr at 08/19/12 1759  Estimated Nutritional Needs:   Kcal: 900 - 1050 kcal Protein: 40 - 50 grams Fluid: 1 - 1.2 liters daily  RD drawn to chart 2/2 Low Braden score. Admitted 2/2 seizure. Noted to have a sodium level of 114 on admission.  Per MD H&P physical exam: pt is grossly cachectic, emaciated in appearance, with temporal muscle wasting. Poor PO intake x > 1 week.  BSE completed on 12/2 recommending dysphagia 1 diet with thin liquids. Per MD, because pt is able to swallow, will not consider enteral feeds at this time. Per MD note today, pt did have pudding last night and take scant amount of po fluids. Current meal intake is 10 - 25%. Discussed intake with family. They confirm ongoing weight loss and poor PO intake. Requesting information on high-calorie/high-protein nutrition therapy. RD provided information.  Stage II on coccyx.  Pt meets criteria for severe MALNUTRITION in the context of chronic illness as evidenced  by severe muscle mass/fat mass wasting and intake of <75% x at least 1 month.  NUTRITION DIAGNOSIS: Inadequate oral intake r/t end of life AEB poor PO intake and wt loss.  MONITORING/EVALUATION(Goals): Goal: Pt to meet >/= 90% of their estimated nutrition needs Monitor: weight trends, lab trends, I/O's, PO intake, supplement tolerance, GOC  EDUCATION NEEDS: -No education needs identified at this time  Jarold Motto MS, RD, LDN Pager: [phone] After-hours pager: [phone]

## 2012-08-20 NOTE — Progress Notes (Addendum)
Noted order for CM assistance with in home pt care from [facility]. Please be aware that services for home health aides such as [facility] is provided thru pt's Medicaid and must be setup outside the hospital. The pt will be evaluated, only after she leaves the hospital, for services and an application completed in the community to be sent to the state for approval. The hospital is unable to provide this services. This pt daughter, Bonita Quin has begun the process. This does generally take more time than the family seems to be aware of ( 4-6 weeks). The daughter states that she has spoke with someone at [facility] and states that they will pay for increased time in the home from [facility] to assist in the home. This has not been my experience and I have confirmed with [facility] . They will happily resume or add any services, however staying in the home is not a services they provide. It would be very unusual for [facility] Medicare to pay for hours of care in the home,they will pay only for visits by providers such as HHRN, HHPT, speech and short term assistance with bathing from a [facility] aide. Please order these services to be resumed from [facility].  Johny Shock RN MPH Case Manager [phone]

## 2012-08-20 NOTE — Progress Notes (Signed)
Physical Therapy Evaluation Patient Details Name: [name] MRN: [id number] DOB: [birth year] Today's Date: 08/20/2012 Time: 4098-1191 PT Time Calculation (min): 21 min  PT Assessment / Plan / Recommendation Clinical Impression  78 yo female admitted with Failure to Thrive; Presents with generalized weakness and fear of falling significantly effecting functional mobility; Will benefit from PT to maximize mobility and safety, and to facilitate dc home  If family is unable to setup 24 hour assistance, we may need to consider SNF    PT Assessment  Patient needs continued PT services    Follow Up Recommendations  Home health PT;Supervision/Assistance - 24 hour    Does the patient have the potential to tolerate intense rehabilitation      Barriers to Discharge Decreased caregiver support Family working on getting 24 hour assist setup    Equipment Recommendations  3 in 1 bedside comode;Wheelchair (measurements);Wheelchair cushion (measurements)    Recommendations for Other Services OT consult   Frequency Min 3X/week    Precautions / Restrictions Precautions Precautions: Fall Precaution Comments: heavy posterior lean in standing   Pertinent Vitals/Pain no apparent distress       Mobility  Bed Mobility Bed Mobility: Supine to Sit;Sitting - Scoot to Edge of Bed Supine to Sit: 3: Mod assist;With rails Sitting - Scoot to Edge of Bed: 3: Mod assist Details for Bed Mobility Assistance: Heavy moderate assist with max cues to initiate and for technique; physical assist to elevate trunk form bed; Used bed pad to help to square hips at EOB Transfers Transfers: Sit to Stand;Stand to Sit Sit to Stand: 3: Mod assist;From bed Stand to Sit: 3: Mod assist;To chair/3-in-1;Without upper extremity assist;With armrests Details for Transfer Assistance: Heavy mod assist for sit to stand secondary to LE weakness and significant posterior lean; Cues for safety and technique, and to control  descent with stand to sit Ambulation/Gait Ambulation/Gait Assistance: 3: Mod assist Ambulation Distance (Feet): 4 Feet (pivot steps bed to chair) Assistive device: Rolling walker Ambulation/Gait Assistance Details: continued posterior lean such that without assist, pt would have fallen backwards; posterior lean improved with taking steps and gentle tactile cues Gait Pattern: Decreased stride length;Trunk flexed    Shoulder Instructions     Exercises     PT Diagnosis: Difficulty walking;Abnormality of gait;Generalized weakness  PT Problem List: Decreased strength;Decreased activity tolerance;Decreased balance;Decreased mobility;Decreased coordination;Decreased cognition;Decreased knowledge of use of DME PT Treatment Interventions: DME instruction;Gait training;Stair training;Functional mobility training;Therapeutic activities;Therapeutic exercise;Patient/family education   PT Goals Acute Rehab PT Goals PT Goal Formulation: With patient/family Time For Goal Achievement: 08/20/12 Potential to Achieve Goals: Fair Pt will go Supine/Side to Sit: with supervision PT Goal: Supine/Side to Sit - Progress: Goal set today Pt will go Sit to Supine/Side: with supervision PT Goal: Sit to Supine/Side - Progress: Goal set today Pt will go Sit to Stand: with supervision PT Goal: Sit to Stand - Progress: Goal set today Pt will go Stand to Sit: with supervision PT Goal: Stand to Sit - Progress: Goal set today Pt will Transfer Bed to Chair/Chair to Bed: with supervision PT Transfer Goal: Bed to Chair/Chair to Bed - Progress: Goal set today Pt will Ambulate: 51 - 150 feet;with min assist;with rolling walker PT Goal: Ambulate - Progress: Goal set today Pt will Go Up / Down Stairs: 3-5 stairs;with min assist;with mod assist;with rail(s) (or be bumped up in wheelchair) PT Goal: Up/Down Stairs - Progress: Goal set today  Visit Information  Last PT Received On: 08/20/12 Assistance Needed: +1 (though +2  is  helpful)    Subjective Data  Subjective: agreeable to OOB Patient Stated Goal: Family would like to bring pt home   Prior Functioning  Home Living Lives With: Family Available Help at Discharge: Family;Available PRN/intermittently (Daughter looking into getting 24 hour assist) Type of Home: House Home Access: Stairs to enter [employer] of Steps: 4 Entrance Stairs-Rails: Right (daughter reports rail in unreliable) Home Layout: One level Home Adaptive Equipment: Walker - rolling Prior Function Level of Independence: Needs assistance Needs Assistance: Bathing;Gait Bath:  (reports "bird bathes") Gait Assistance: Uses RW Communication Communication: Expressive difficulties;Other (comment) (quite soft spoken)    Cognition  Overall Cognitive Status: Appears within functional limits for tasks assessed/performed Arousal/Alertness: Awake/alert Orientation Level: Appears intact for tasks assessed Behavior During Session: [facility] for tasks performed    Extremity/Trunk Assessment Right Upper Extremity Assessment RUE ROM/Strength/Tone: [facility] for tasks assessed Left Upper Extremity Assessment LUE ROM/Strength/Tone: WFL for tasks assessed Right Lower Extremity Assessment RLE ROM/Strength/Tone: Deficits RLE ROM/Strength/Tone Deficits: generalized weakness Left Lower Extremity Assessment LLE ROM/Strength/Tone: Deficits LLE ROM/Strength/Tone Deficits: Generallized weakness   Balance    End of Session PT - End of Session Equipment Utilized During Treatment: Gait belt Activity Tolerance: Patient tolerated treatment well;Patient limited by fatigue Patient left: in chair;with call bell/phone within reach;with family/visitor present (with Speech Therapy) Nurse Communication: Mobility status  GP     Olen Pel [place], [place] [phone]  08/20/2012, 2:27 PM

## 2012-08-20 NOTE — Progress Notes (Signed)
Chart review complete.  Patient is not eligible for [facility] Care Management services because her current POC is for end of life care.  For any additional questions or new referrals please contact Anibal Henderson BSN RN [facility] Liaison at [phone]

## 2012-08-20 NOTE — Progress Notes (Signed)
Subjective: Patient did have pudding last night. Scant amount of po fluids.  She is awake, very soft spoken. Denies emesis or nausea. Objective: Lab: Lab Results  Component Value Date   WBC 6.0 08/17/2012   HGB 10.9* 08/17/2012   HCT 30.9* 08/17/2012   MCV 79.4 08/17/2012   PLT 203 08/17/2012   BMET    Component Value Date/Time   NA 128* 08/19/2012 0630   K 3.4* 08/19/2012 0630   CL 94* 08/19/2012 0630   CO2 22 08/19/2012 0630   GLUCOSE 128* 08/19/2012 0630   BUN 12 08/19/2012 0630   CREATININE 0.78 08/19/2012 0630   CALCIUM 9.1 08/19/2012 0630   GFRNONAA 70* 08/19/2012 0630   GFRAA 81* 08/19/2012 0630   New labs pending  Imaging:  Scheduled Meds:   . feeding supplement  1 Container Oral TID BM  . heparin  5,000 Units Subcutaneous Q8H  . isosorbide mononitrate  30 mg Oral Daily  . levothyroxine  88 mcg Oral QAC breakfast  . mesalamine  1,000 mg Rectal BID  . pantoprazole  40 mg Oral Daily  . sodium chloride  3 mL Intravenous Q12H  . sucralfate  1 g Oral QID   Continuous Infusions:   . dextrose 5 % and 0.9% NaCl 75 mL/hr at 08/19/12 1759   PRN Meds:.fluticasone, hydrALAZINE, polyethylene glycol   Physical Exam: Filed Vitals:   08/20/12 0521  BP: 170/68  Pulse: 70  Temp: 97.7 F (36.5 C)  Resp: 16  Very elderly and frail AA woman in no distress HEENT- ARcus senilis, C&S w/o icterius Cor- 2+ radial, RRR PUlm - no increased WOB Abd- soft Neuro - awake, recognizes examiner.      Assessment/Plan: 1. FTT - she is encouraged to eat more and drink adequate fluids. PT eval pending. She will need to be stronger to return home.  2. Anemia - H/H pending  3. F/E/N - lab pending.   Illene Regulus [place] IM (o) [phone]; (c) [phone] Call-grp - Patsi Sears IM  Tele: [phone]  08/20/2012, 6:54 AM

## 2012-08-20 NOTE — Progress Notes (Signed)
Speech Language Pathology Dysphagia Treatment Patient Details Name: [name] MRN: [id number] DOB: 1917-02-03 Today's Date: 08/20/2012 Time: 0102-7253 SLP Time Calculation (min): 25 min  Assessment / Plan / Recommendation Clinical Impression  Pt. seen today for dysphagia treatment to further ensure safety, intake and possible diet upgrade.  Pt. much more alert, engaged and aware today and daughters present.  Pt. able to orally manipulate, transit and initiate a more timely swallow with water via cup and straw.  Oral manipulation and mastication with pieces of graham cracker with puree texture was mildly delayed, however functional.  She was also able to masticate small pieces of cracker (without puree) with mild-moderate delays.  One delayed throat clear present at end of session, however she would appear safe with upgraded diet texture to Dys 2 and continue thin liquids.  Pt. can use straws if given supervision to ensure small sips.  Daughters asking questions regarding preparing puree texture if pt. needs it once discharged/if she exhibits increased fatigue or is not tolerating the chopped/minced texture.  ST will continue to follow.          Diet Recommendation  Initiate / Change Diet: Dysphagia 2 (fine chop);Thin liquid    SLP Plan Continue with current plan of care       Swallowing Goals  SLP Swallowing Goals Patient will consume recommended diet without observed clinical signs of aspiration with: Maximum assistance Swallow Study Goal #1 - Progress: Progressing toward goal Patient will utilize recommended strategies during swallow to increase swallowing safety with: Maximal cueing Swallow Study Goal #2 - Progress: Progressing toward goal  General Temperature Spikes Noted: No Respiratory Status: Room air Behavior/Cognition: Alert;Cooperative;Requires cueing Oral Cavity - Dentition: Edentulous Patient Positioning: Upright in chair  Oral Cavity - Oral Hygiene Does patient have  any of the following "at risk" factors?: Nutritional status - inadequate Brush patient's teeth BID with toothbrush (using toothpaste with fluoride): Yes Patient is AT RISK - Oral Care Protocol followed (see row info): Yes   Dysphagia Treatment Treatment focused on: Skilled observation of diet tolerance;Patient/family/caregiver education;Upgraded PO texture trials Treatment Methods/Modalities: Skilled observation Patient observed directly with PO's: Yes Type of PO's observed: Dysphagia 3 (soft);Thin liquids Feeding: Able to feed self;Needs assist;Needs set up Liquids provided via: Cup;Straw Oral Phase Signs & Symptoms: Prolonged bolus formation Pharyngeal Phase Signs & Symptoms: Suspected delayed swallow initiation;Multiple swallows;Delayed throat clear Type of cueing: Verbal;Tactile;Visual Amount of cueing: Moderate        Breck Coons [place].Ed [employer] [phone]  08/20/2012

## 2012-08-21 ENCOUNTER — Telehealth: Payer: Self-pay | Admitting: Internal Medicine

## 2012-08-21 NOTE — Progress Notes (Signed)
Physical Therapy Treatment Patient Details Name: [name] MRN: [id number] DOB: September 02, 1917 Today's Date: 08/21/2012 Time: 8413-2440 PT Time Calculation (min): 23 min  PT Assessment / Plan / Recommendation Comments on Treatment Session  Admitted with FTT, and not making significant progress with mobility from yesterday's session; Still noting family would like to take pt home again -- this is an option only if family can arrange for 24 hour reliable assist for pt -- ohterwise, will need to consider SNF; For home, it is worth considering a wheelchair with cushion    Follow Up Recommendations  Supervision/Assistance - 24 hour;Home health PT;SNF     Does the patient have the potential to tolerate intense rehabilitation     Barriers to Discharge        Equipment Recommendations  3 in 1 bedside comode;Wheelchair (measurements);Wheelchair cushion (measurements)    Recommendations for Other Services OT consult  Frequency Min 3X/week   Plan Discharge plan remains appropriate    Precautions / Restrictions Precautions Precautions: Fall Precaution Comments: heavy posterior lean in standing Restrictions Weight Bearing Restrictions: No   Pertinent Vitals/Pain no apparent distress     Mobility  Bed Mobility Bed Mobility: Supine to Sit;Sitting - Scoot to Edge of Bed Supine to Sit: 3: Mod assist;With rails Sitting - Scoot to Edge of Bed: 3: Mod assist Details for Bed Mobility Assistance: Heavy moderate assist with max cues to initiate and for technique; physical assist to elevate trunk form bed; Used bed pad to help to square hips at EOB Transfers Transfers: Sit to Stand;Stand to Sit Sit to Stand: 3: Mod assist;From bed Stand to Sit: 3: Mod assist;To chair/3-in-1;Without upper extremity assist;With armrests Details for Transfer Assistance: Heavy mod assist for sit to stand secondary to LE weakness and significant posterior lean; Cues for safety and technique, and to control descent  with stand to sit Ambulation/Gait Ambulation/Gait Assistance: 3: Mod assist;2: Max assist Ambulation Distance (Feet): 4 Feet (pivot steps bed to [facility], then sidesteps BSC to recliner) Assistive device: Rolling walker Ambulation/Gait Assistance Details: More difficulty today with ambulation, with continued heavy posterior lean and noted difficulty stepping LLE for sidesteps -- unsure if this is due to decr weight shift onto RLE to unweigh LLE for stepping or decr strength/coordination of LLE for stepping; Posterior lean even more pronounced today -- brings into question how ambulatory pt was prior to coming in Gait Pattern: Decreased stride length;Decreased step length - left;Trunk flexed (psoterior lean)    Exercises     PT Diagnosis:    PT Problem List:   PT Treatment Interventions:     PT Goals Acute Rehab PT Goals Time For Goal Achievement: 09/03/12 (yesterday was a mis-entry) Potential to Achieve Goals: Fair Pt will go Supine/Side to Sit: with supervision PT Goal: Supine/Side to Sit - Progress: Not progressing Pt will go Sit to Stand: with supervision PT Goal: Sit to Stand - Progress: Not progressing Pt will go Stand to Sit: with supervision PT Goal: Stand to Sit - Progress: Not progressing Pt will Transfer Bed to Chair/Chair to Bed: with supervision PT Transfer Goal: Bed to Chair/Chair to Bed - Progress: Not progressing Pt will Ambulate: 51 - 150 feet;with min assist;with rolling walker PT Goal: Ambulate - Progress: Not progressing  Visit Information  Last PT Received On: 08/21/12 Assistance Needed: +2 (+2 is helpful for efficiency/chair behind)    Subjective Data  Subjective: agreeable to OOB; needing to get ot [facility] Patient Stated Goal: Family would like to bring pt home  Cognition  Overall Cognitive Status: Appears within functional limits for tasks assessed/performed Arousal/Alertness: Awake/alert Orientation Level: Appears intact for tasks assessed Behavior During  Session: [facility] for tasks performed Cognition - Other Comments: Slow to answer questions; Quite softspoken    Balance     End of Session PT - End of Session Equipment Utilized During Treatment: Gait belt Activity Tolerance: Patient tolerated treatment well;Other (comment) (seems limited by fear of falling) Patient left: in chair;with call bell/phone within reach Nurse Communication: Mobility status   GP     Van Clines [facility] [place], [place] [phone]'  08/21/2012, 11:43 AM

## 2012-08-21 NOTE — Care Management Note (Addendum)
Met with family members , daughter Bonita Quin and [employer] representative, [place]. Annetta Maw is not able to confirm benefits and therefore not speak to the family re [employer] and [facility] aide services. Daughter was encouraged by this CM and [employer] representative to again call [employer] benefits division to validate their support of [facility] aide for in home continuous care. The daughter, Bonita Quin will continue to pursue [facility] aide services with [facility], this is in process, they will ask Dr Debby Bud for a written prescription for such services with [facility] as the only provider.  [facility] is in place to resume services for this pt at d/c, HHRN, HHPT , HHOT, and HH aide. The family also request HH speech. The family is available to provide 24 hr support until they are able to obtain [facility] aide services for 5+ hr per day.  Johny Shock RN MPH Case Manager [phone]        CARE MANAGEMENT NOTE 08/21/2012  Patient:  [name], [name]   Account Number:  [id number]  Date Initiated:  08/20/2012  Documentation initiated by:  Ebubechukwu Jedlicka  Subjective/Objective Assessment:   Pt active with [facility] on admission, order for CM to assist with [facility] and inhome care.     Action/Plan:   Spoke with daughter, Bonita Quin and explained that this agency of [facility] aides is paid out of Medicaid and family will need to persue, she has started the process. Explained that [facility] will not pay for hours of care in the home.   Anticipated DC Date:  08/21/2012   Anticipated DC Plan:  HOME W HOME HEALTH SERVICES         Choice offered to / List presented to:          [facility] arranged  HH-1 RN  HH-2 PT  HH-4 NURSE'S AIDE  HH-5 SPEECH THERAPY      HH agency  [facility]   Status of service:  In process, will continue to follow Medicare Important Message given?   (If response is "NO", the following Medicare IM given date fields will be blank) Date Medicare IM given:   Date Additional Medicare IM given:     Discharge Disposition:  HOME W HOME HEALTH SERVICES  Per UR Regulation:    If discussed at Long Length of Stay Meetings, dates discussed:    Comments:  08/21/2012 Discussed with pt, pt daughter, Bonita Quin and [facility] Medicare representative again to day the issue of [facility] aide for 5+ hr of care in the home per day. Bonita Quin remains confident that this will be covered by [facility], and will continue to pursue this with [facility]. This CM is unable to set this up and [facility] representative is unable to confirm that this is an actual benefit that the pt's insurance offers. This CM continued to tell the daughter that the pt Medicaid would most likely cover this, however the daughter is firm in her resolve that [facility] is "under the [facility] umbrella "and will provide care in the home for 5hr. She is now seeking an agency to provide additional hours for a total of 8 hr per day. This CM encouraged pt daughter to pursue this effort. Johny Shock RN MPH

## 2012-08-21 NOTE — Telephone Encounter (Signed)
It was done this AM. Check with Carollee Herter

## 2012-08-21 NOTE — Telephone Encounter (Signed)
Pt's daughter, Bosie Clos called to inquire as to when she can pick up the paperwork for [facility].

## 2012-08-21 NOTE — Clinical Documentation Improvement (Signed)
MALNUTRITION DOCUMENTATION CLARIFICATION  THIS DOCUMENT IS NOT A PERMANENT PART OF THE MEDICAL RECORD  TO RESPOND TO THE THIS QUERY, FOLLOW THE INSTRUCTIONS BELOW:  1. If needed, update documentation for the patient's encounter via the notes activity.  2. Access this query again and click edit on the In [employer].  3. After updating, or not, click F2 to complete all highlighted (required) fields concerning your review. Select "additional documentation in the medical record" OR "no additional documentation provided".  4. Click Sign note button.  5. The deficiency will fall out of your In Basket *Please let us know if you are not able to complete this workflow by phone or e-mail (listed below).  Please update your documentation within the medical record to reflect your response to this query.                                                                                         08/21/12   Dear Dr. Debby Bud / Associates,  In a better effort to capture your patient's severity of illness, reflect appropriate length of stay and utilization of resources, a review of the patient medical record has revealed the following indicators.    Based on your clinical judgment, please clarify and document in a progress note and/or discharge summary the clinical condition associated with the following supporting information:  In responding to this query please exercise your independent judgment.  The fact that a query is asked, does not imply that any particular answer is desired or expected.  Possible Clinical Conditions? Severe Malnutrition    Severe Protein Calorie Malnutrition  Other Condition  Cannot clinically determine    Supporting Information:  Risk Factors:(As per notes)Per RD Nutrition Assessment:Pt meets criteria for severe MALNUTRITION in the context of chronic illness as evidenced by severe muscle mass/fat mass wasting and intake of <75% x at least 1 month"2  Signs &  Symptoms:"Severe malnutrition in the context of chronic illness"  Ht: 5'2"     Wt: 87 LBS 4.8 OZ  BMI: 16.5  Diagnostics :INITIAL ADULT NUTRITION ASSESSMENT Date: 08/20/2012   Time: 11:41 AM  Treatment:  INTERVENTION: 1. Add Ensure Complete daily for variety per discussion with family 2. RD provided handouts per family request on pureed foods, high-calorie/high-protein recipes, and tips for weight gain. 3. RD to continue to follow nutrition careplan    You may use possible, probable, or suspect with inpatient documentation. possible, probable, suspected diagnoses MUST be documented at the time of discharge  Reviewed: additional documentation in the medical record  Thank You,  Joanette Gula Delk RN, BSN Clinical Documentation Specialist: [phone] Pager Health Information Management [place]

## 2012-08-21 NOTE — Progress Notes (Signed)
Subjective: Appreciate case mgt note - will set up continuation of HH services at discharge. Appreciate SLP and RD follow up.   Patient is awake but soft in speech and lethargic. Voices no complaints  Objective: Lab: Lab Results  Component Value Date   WBC 6.0 08/17/2012   HGB 11.5* 08/20/2012   HCT 33.1* 08/20/2012   MCV 79.4 08/17/2012   PLT 203 08/17/2012   BMET    Component Value Date/Time   NA 130* 08/20/2012 0615   K 3.5 08/20/2012 0615   CL 98 08/20/2012 0615   CO2 24 08/20/2012 0615   GLUCOSE 126* 08/20/2012 0615   BUN 6 08/20/2012 0615   CREATININE 0.68 08/20/2012 0615   CALCIUM 9.3 08/20/2012 0615   GFRNONAA 73* 08/20/2012 0615   GFRAA 85* 08/20/2012 0615     Imaging: no imaging Scheduled Meds:   . feeding supplement  237 mL Oral Q24H  . feeding supplement  1 Container Oral TID BM  . fluticasone  2 spray Each Nare Daily  . heparin  5,000 Units Subcutaneous Q8H  . isosorbide mononitrate  30 mg Oral Daily  . levothyroxine  88 mcg Oral QAC breakfast  . mesalamine  1,000 mg Rectal BID  . pantoprazole  40 mg Oral Daily  . sodium chloride  3 mL Intravenous Q12H  . sucralfate  1 g Oral QID   Continuous Infusions:   . dextrose 5 % and 0.9% NaCl 75 mL/hr at 08/20/12 2101   PRN Meds:.hydrALAZINE, polyethylene glycol   Physical Exam: Filed Vitals:   08/21/12 0527  BP: 158/67  Pulse: 67  Temp: 97.8 F (36.6 C)  Resp: 18    Intake/Output Summary (Last 24 hours) at 08/21/12 0703 Last data filed at 08/20/12 1600  Gross per 24 hour  Intake   1425 ml  Output      0 ml  Net   1425 ml   Very elderly and fraill AA woman in no distress Cor - RRR PUlm - normal respirations Abd - BS+ soft, no guarding or rebound Neuro - seems awake. Speech is so soft it is hard to understand.    Assessment/Plan: 1. FF - taking more calories and fluids. Appreciate SLP and PT. Note that aide/in-home sitters are very limited. Plan - follow up labs in AM  Hope to d/c home in  AM  2,3 stable   [employer] IM (o) [phone]; (c) [phone] Call-grp - Patsi Sears IM  Tele: [phone]  08/21/2012, 7:03 AM

## 2012-08-22 ENCOUNTER — Telehealth: Payer: Self-pay | Admitting: *Deleted

## 2012-08-22 DIAGNOSIS — E46 Unspecified protein-calorie malnutrition: Secondary | ICD-10-CM

## 2012-08-22 LAB — BASIC METABOLIC PANEL
BUN: 7 mg/dL (ref 6–23)
Chloride: 101 mEq/L (ref 96–112)
GFR calc Af Amer: 81 mL/min — ABNORMAL LOW (ref >90)
GFR calc non Af Amer: 70 mL/min — ABNORMAL LOW (ref >90)
Glucose, Bld: 87 mg/dL (ref 70–99)
Potassium: 3.5 mEq/L (ref 3.5–5.1)
Sodium: 132 mEq/L — ABNORMAL LOW (ref 135–145)

## 2012-08-22 NOTE — Telephone Encounter (Signed)
[name] is aware.

## 2012-08-22 NOTE — Progress Notes (Signed)
Subjective: Awake. No distress. Complains left arm pain  Objective: Lab: Lab Results  Component Value Date   WBC 6.0 08/17/2012   HGB 11.5* 08/20/2012   HCT 33.1* 08/20/2012   MCV 79.4 08/17/2012   PLT 203 08/17/2012   BMET    Component Value Date/Time   NA 130* 08/20/2012 0615   K 3.5 08/20/2012 0615   CL 98 08/20/2012 0615   CO2 24 08/20/2012 0615   GLUCOSE 126* 08/20/2012 0615   BUN 6 08/20/2012 0615   CREATININE 0.68 08/20/2012 0615   CALCIUM 9.3 08/20/2012 0615   GFRNONAA 73* 08/20/2012 0615   GFRAA 85* 08/20/2012 0615     Imaging:  Scheduled Meds:   . feeding supplement  237 mL Oral Q24H  . feeding supplement  1 Container Oral TID BM  . fluticasone  2 spray Each Nare Daily  . heparin  5,000 Units Subcutaneous Q8H  . isosorbide mononitrate  30 mg Oral Daily  . levothyroxine  88 mcg Oral QAC breakfast  . mesalamine  1,000 mg Rectal BID  . pantoprazole  40 mg Oral Daily  . sodium chloride  3 mL Intravenous Q12H  . sucralfate  1 g Oral QID   Continuous Infusions:   . dextrose 5 % and 0.9% NaCl 75 mL/hr at 08/21/12 2136   PRN Meds:.hydrALAZINE, polyethylene glycol   Physical Exam: Filed Vitals:   08/22/12 0530  BP: 149/74  Pulse: 61  Temp: 97.7 F (36.5 C)  Resp: 16        Assessment/Plan: For discharge to home with [facility].  Dictated #[id number]   Illene Regulus Brinkley IM (o) [phone]; (c) [phone] Call-grp - Patsi Sears IM  Tele: [phone]  08/22/2012, 6:45 AM

## 2012-08-22 NOTE — Care Management Note (Signed)
   CARE MANAGEMENT NOTE 08/22/2012  Patient:  [name], [name]   Account Number:  [id number]  Date Initiated:  08/20/2012  Documentation initiated by:  Devaun Hernandez  Subjective/Objective Assessment:   Pt active with [facility] on admission, order for CM to assist with [facility] and inhome care.     Action/Plan:   Spoke with daughter, [name] and explained that this agency of [facility] aides is paid out of Medicaid and family will need to persue, she has started the process. Explained that [facility] will not pay for hours of care in the home.   Anticipated DC Date:  08/22/2012   Anticipated DC Plan:  HOME W HOME HEALTH SERVICES         Choice offered to / List presented to:          [facility] arranged  HH-1 RN  HH-2 PT  HH-4 NURSE'S AIDE  HH-5 SPEECH THERAPY      HH agency  [facility]   Status of service:  In process, will continue to follow Medicare Important Message given?   (If response is "NO", the following Medicare IM given date fields will be blank) Date Medicare IM given:   Date Additional Medicare IM given:    Discharge Disposition:  HOME W HOME HEALTH SERVICES  Per UR Regulation:    If discussed at Long Length of Stay Meetings, dates discussed:    Comments:  08/22/2012 Noted order for wheelchair, due to pt insurance wheelchair will need to be ordered from [place]. Orders, demographics and d/c summary with diagnosis information faxed to [facility]. Call placed to that provider and spoke with Shanda Bumps asking for wheelchair to be delivered to pt home this pm, as pt daughter, [name] plan to pick up pt at noon. This CM confirmed with [name] that home delivery was appropriate. [facility] notified of plan to d/c to home today. Order to resume [facility] services in record. Johny Shock RN MPH, [phone]   08/21/2012 Discussed with pt, pt daughter, [name] and [facility] Medicare representative again to day the issue of [facility] aide for 5+ hr of care in the home per day. [name] remains confident that this will be  covered by [facility], and will continue to pursue this with [facility]. This CM is unable to set this up and [facility] representative is unable to confirm that this is an actual benefit that the pt's insurance offers. This CM continued to tell the daughter that the pt Medicaid would most likely cover this, however the daughter is firm in her resolve that [facility] is "under the [facility] umbrella "and will provide care in the home for 5hr. She is now seeking an agency to provide additional hours for a total of 8 hr per day. This CM encouraged pt daughter to pursue this effort. Johny Shock RN MPH

## 2012-08-22 NOTE — Telephone Encounter (Signed)
Pt's daughter, Dawayne Patricia, called requesting an Emergency CAP letter for pt, letter for CNA services, 2 letters for prior extended care for [facility] and [employer]. [phone]). Please advise accordingly.

## 2012-08-22 NOTE — Progress Notes (Signed)
Speech Language Pathology Dysphagia Treatment Patient Details Name: Shallyn Constancio MRN: [id number] DOB: 29-May-1917 Today's Date: 08/22/2012 Time: 4098-1191 SLP Time Calculation (min): 15 min  Assessment / Plan / Recommendation Clinical Impression  Spoke with MD regarding pt status. Per MD, pt is to be dc'd home today. Per prior SLP notes, home health follow up is recommended. Unable to observe with po trials today, due to pt insufficiently arousable for po intake at this time. Pt currently on Dys 2 diet with thin  liquids.    Diet Recommendation  Continue with Current Diet: Dysphagia 2 (fine chop);Thin liquid    SLP Plan Continue with current plan of care   Pertinent Vitals/Pain None apparent   Swallowing Goals  SLP Swallowing Goals Swallow Study Goal #1 - Progress: Progressing toward goal Swallow Study Goal #2 - Progress: Progressing toward goal  General Temperature Spikes Noted: No Respiratory Status: Room air Behavior/Cognition: Lethargic   Dysphagia Treatment Patient observed directly with PO's: No Reason PO's not observed: Lethargic  Madden Piazza B. [place], [facility], CCC-SLP [phone]      Leigh Aurora 08/22/2012, 10:06 AM

## 2012-08-22 NOTE — Discharge Summary (Signed)
NAMEARDELL, AARONSON              ACCOUNT NO.:  [id number]  MEDICAL RECORD NO.:  [id number]  LOCATION:                                 FACILITY:  PHYSICIAN:  Rosalyn Gess. Norins, MD  DATE OF BIRTH:  09-Sep-1917  DATE OF ADMISSION:  08/16/2012 DATE OF DISCHARGE:  08/22/2012                              DISCHARGE SUMMARY   ADMITTING DIAGNOSES: 1. Failure to thrive with inanition and poor p.o. intake. 2. Hyponatremia.  DISCHARGE DIAGNOSES: 1. Failure to thrive.  The patient with progressive weakness, but no     underlying acute or uncontrolled medical condition. 2. Hyponatremia, resolved with last available sodium of 130 on     August 20, 2012.  Labs from August 22, 2012, pending. 3. Severe calorie-protein malnutrition.  CONSULTANTS:  [facility].  PROCEDURES:  CT of the head, August 16, 2012, which showed no acute intracranial abnormality.  Moderate-to-severe generalized atrophy and moderate-to-severe chronic microvascular ischemic changes of the white matter.  Chest x-ray August 16, 2012, which showed stable cardiomegaly. Hyperinflation consistent with COPD and/or asthma.  No acute cardiopulmonary disease.  HISTORY OF PRESENT ILLNESS:  [name] is a 78 year old woman who has had slow and progressive decline.  She does have a history of arteriovenous malformations and GI bleeding and recently was hospitalized for a GI bleed requiring a 2-unit transfusion.  The patient has been at home since that time.  The patient's family brought her to the hospital because of witnessed seizure like episode that lasted 30- 120 seconds characterized by rhythmic jerking.  Further details are not really available in regards to incontinence or postictal changes.  In the emergency department, the patient was found to have a sodium of 114 down from 139 at discharge 1028.  She was admitted for this reason.  Please see the H and P for past medical history, family history,  social history, and admission examination as well as previous epic notes.  HOSPITAL COURSE: 1. Failure to thrive.  The patient with increasing weakness and     fatigue.  There was no specific underlying reason for this.  I had     a long discussion with the patient and her family.  She clearly     states that she is not giving up and does want to live.  She is     instructed that she needs to increase her p.o. intake of both food     and fluids.  The patient then started doing better using dysphagia     2 pureed diet as well as using supplements.  Please see below.     With the patient being medically stable  with her being at her     mental baseline, she at this time is ready for discharge to home. 2. Hyponatremia.  It was thought this was related to her poor p.o.     intake and dehydration.  She was hydrated gently with normal     saline.  Her sodium did slowly improve.  Last available lab was on     August 20, 2012, with a sodium of 130, potassium was normal at     3.5, CO2 was 24, chloride  was 98, BUN was 6, creatinine was 0.68.     Labs from August 22, 2012, are pending at the time of discharge     dictation.  Nursing staff is advised to call if there is any     significant abnormality or regression in regards to her sodium. 3. Severe protein-calorie malnutrition.  The patient has had inanition     for the past several months.  Her family does incur she should eat.     She was seen by registered nutritionist who felt the patient did in     fact require supplements which had already been started.  The     patient's BMI is very low, being at 83% of her ideal body weight.     The patient's body mass index was 16.5.  Again this was discussed     with the patient in the presence of her family that  she does need     to work hard on eating and drinking fluids and that she will be     given supplements including Ensure pudding and Ensure liquid.     Given the patient's advanced age,  this will be an ongoing struggle. 4. Anemia.  The patient's hemoglobin has been stable during this     hospitalization.  Last available hemoglobin 11.5 g on August 20, 2012.  Labs for August 22, 2012, pending.  The patient is at risk     for recurrent AVM bleed but currently is stable.  With the patient's sodium being corrected with there being no significant underlying active medical problems, the patient is stable for discharge.  The patient's family does wish to take her home.  They had requested [facility] for  Services.  Case management did consult and the situation is such that patient will require to apply for such services through Medicaid and DSF.  Case manager was not aware that [employer] would cover this service.  We will resume the patient's home health services in regards to PT, OT and nursing visits.  DISCHARGE EXAMINATION:  VITAL SIGNS:  Temperature was 97.7, blood pressure 149/74, heart rate 61, respirations 16, oxygen saturations 98%. GENERAL APPEARANCE:  A wizened 78 year old African American woman with fascial thinning and temporal wasting, who has a very soft voice but is awake and alert and in no distress. HEENT:  Temporal wasting is noted.  Conjunctivae and sclerae were clear. Oropharynx without lesions. NECK:  Supple. CHEST:  Without deformity. PULMONARY:  The patient has  no increased work of breathing.  She has shallow inspiration.  She has no rales, wheezes or rhonchi. CARDIOVASCULAR:  A 2+ radial pulse.  Her precordium is quiet.  Her heart rate is regular.  Heart sounds are normal. ABDOMEN:  Scaphoid.  She has positive bowel sounds.  No guarding or rebound. NEURO:  The patient is awake and alert.  She does move all extremities to command. EXTREMITIES:  The patient  complained of pain in her left arm.  She has the ability to lift this off the bed.  She has normal flexion and extension.  Palpation of the elbow and wrist were unremarkable with  no synovial thickening, inflammation or tenderness.  FINAL LABORATORY:  From August 20, 2012, sodium 130, potassium 3.5, chloride 98, CO2 of 24, BUN is 6, creatinine 0.68, glucose was 126, hemoglobin 11.5 g.  The patient's thyroid function/TSH on August 17, 2012, was at 1.786.  DISCHARGE MEDICATIONS:  The patient will resume all  of her home medications without change.  She will continue on feeding supplements using Ensure pudding and/or liquid.  DISPOSITION:  The patient is to be discharged home.  FOLLOWUP:  The patient will be seen in 7-10 days for followup.  CONDITION:  The patient's condition at the time of discharge dictation is improved.  She has a very guarded prognosis given her advanced age and high risk for other additional medical problems.  CODE STATUS:  The patient is a DNR.     Rosalyn Gess Norins, MD     MEN/MEDQ  D:  08/22/2012  T:  08/22/2012  Job:  [id number]

## 2012-08-29 ENCOUNTER — Ambulatory Visit (INDEPENDENT_AMBULATORY_CARE_PROVIDER_SITE_OTHER): Payer: Medicare HMO | Admitting: Internal Medicine

## 2012-08-29 ENCOUNTER — Encounter: Payer: Self-pay | Admitting: Internal Medicine

## 2012-08-29 VITALS — BP 140/82 | HR 69 | Temp 98.4°F | Resp 12

## 2012-08-29 DIAGNOSIS — R627 Adult failure to thrive: Secondary | ICD-10-CM

## 2012-08-29 DIAGNOSIS — I10 Essential (primary) hypertension: Secondary | ICD-10-CM

## 2012-08-29 DIAGNOSIS — K922 Gastrointestinal hemorrhage, unspecified: Secondary | ICD-10-CM

## 2012-08-29 MED ORDER — ASPIRIN EC 81 MG PO TBEC: 81.0000 mg | DELAYED_RELEASE_TABLET | ORAL | Status: DC

## 2012-08-29 NOTE — Patient Instructions (Addendum)
Good to see you. Keep up the good work of eating and taking your supplements  Canasa suppository is for inflammatory bowel disease/inflammation. Will check with the GI doctors about how long to continue.  Aspirin does not cause diverticular bleeding and the benefit of 3 day a week ASA re: stroke prevention and coronary disease prevention warrants continuation  If the [facility] nurse feels that it is necessary a pressure reduction mattress overlay can be order to help with irritation at the coccyx.  Swelling of the feet and ankles - have check kidney function and heart function in the recent past and these are not the cause of swelling. The best treatment is either a very firm over the counter hosiery or the use of ACE bandage wrap: start at mid-foot and wrap to above the ankle first thing in the AM - should be snug but not tight when put on.  Last blood count December 3rd was 11 g - very good.   Continue all current medications.

## 2012-08-29 NOTE — Progress Notes (Signed)
Subjective:    Patient ID: [name], female    DOB: 1917/03/10, 78 y.o.   MRN: [id number]  HPI [name] presents for hospital follow-up when she was admitted for FTT due to very poor PO intake. During her hospital stay she had full swallow eval and was able to take POs but puree diet was recommended along with supplement TID. [name] was willing to work harder on taking in adequate nutrition and per family report has been doing well and taking supplement.  Several questions: ASA use; mesalamine use BID; continuation of miralax.   There is a need for a letter stipulating her need for more than 4 hours a day of care. Done.  Past Medical History  Diagnosis Date  . Internal hemorrhoid   . Diverticulosis of colon (without mention of hemorrhage)   . Herpes zoster   . Glaucoma(365)   . Lumbar back pain   . Hypertension   . Cardiomyopathy   . Hypothyroid    Past Surgical History  Procedure Date  . Thyroidectomy   . Appendectomy   . Tubal ligation   . Colonoscopy 07/19/2012    Procedure: COLONOSCOPY;  Surgeon: Meryl Dare, MD,FACG;  Location: [facility];  Service: Endoscopy;  Laterality: N/A;   History reviewed. No pertinent family history. History   Social History  . Marital Status: Widowed    Spouse Name: N/A    Number of Children: N/A  . Years of Education: N/A   Occupational History  . Not on file.   Social History Main Topics  . Smoking status: Never Smoker   . Smokeless tobacco: Never Used  . Alcohol Use: No  . Drug Use: No  . Sexually Active: Not Currently   Other Topics Concern  . Not on file   Social History Narrative   Lives alone but family stay with her at night. She remains independent. She has a DNR established but does want medical care.    Current Outpatient Prescriptions on File Prior to Visit  Medication Sig Dispense Refill  . b complex vitamins capsule Take 1 capsule by mouth daily.        . fluticasone (VERAMYST) 27.5 MCG/SPRAY  nasal spray Place 2 sprays into the nose daily as needed. For allergies      . isosorbide mononitrate (IMDUR) 30 MG 24 hr tablet Take 30 mg by mouth daily.      Marland Kitchen levothyroxine (SYNTHROID, LEVOTHROID) 88 MCG tablet TAKE 1 TABLET BY MOUTH ONCE A DAY  30 tablet  5  . lisinopril-hydrochlorothiazide (PRINZIDE,ZESTORETIC) 10-12.5 MG per tablet TAKE 1 TABLET BY MOUTH DAILY.  30 tablet  10  . mesalamine (CANASA) 1000 MG suppository Place 1 suppository (1,000 mg total) rectally 2 (two) times daily.  30 suppository  1  . nitroGLYCERIN (NITROSTAT) 0.4 MG SL tablet Place 1 tablet (0.4 mg total) under the tongue every 5 (five) minutes as needed.  30 tablet  5  . pantoprazole (PROTONIX) 40 MG tablet TAKE 1 TABLET BY MOUTH DAILY  30 tablet  5  . polyethylene glycol (MIRALAX / GLYCOLAX) packet Take 17 g by mouth daily as needed. For constipation      . sertraline (ZOLOFT) 25 MG tablet Take 25 mg by mouth daily.      . sucralfate (CARAFATE) 1 GM/10ML suspension Take 1 g by mouth 4 (four) times daily.      . [DISCONTINUED] isosorbide dinitrate (ISORDIL) 30 MG tablet Take 30 mg by mouth 3 (three) times daily.        . [  DISCONTINUED] ranitidine (ZANTAC) 150 MG tablet Take 150 mg by mouth 2 (two) times daily.            Review of Systems System review is negative for any constitutional, cardiac, pulmonary, GI or neuro symptoms or complaints other than as described in the HPI.     Objective:   Physical Exam Filed Vitals:   08/29/12 1548  BP: 140/82  Pulse: 69  Temp: 98.4 F (36.9 C)  Resp: 12   Wt Readings from Last 3 Encounters:  08/21/12 94 lb 1.6 oz (42.683 kg)  08/01/12 80 lb 1.3 oz (36.324 kg)  07/16/12 94 lb (42.638 kg)   Gen'l - an emaciated AA woman who appears somnolent - drifts off to sleep during the visit but denies pain or discomfort. He voice is very weak and she is hard to hear. HEENT- temporal wasting is noted Cor- 2+ radial pulse Pulm - normal, unlabored respirations abd - soft,  BS+, non-tender Neuro - very soft spoken but does answer questions appropriately       Assessment & Plan:

## 2012-09-01 NOTE — Assessment & Plan Note (Signed)
BP Readings from Last 3 Encounters:  08/29/12 140/82  08/22/12 126/60  08/01/12 152/88   BMET    Component Value Date/Time   NA 132* 08/22/2012 0630   K 3.5 08/22/2012 0630   CL 101 08/22/2012 0630   CO2 22 08/22/2012 0630   GLUCOSE 87 08/22/2012 0630   BUN 7 08/22/2012 0630   CREATININE 0.78 08/22/2012 0630   CALCIUM 8.8 08/22/2012 0630   GFRNONAA 70* 08/22/2012 0630   GFRAA 81* 08/22/2012 0630     Adequate control on present regimen.

## 2012-09-01 NOTE — Assessment & Plan Note (Signed)
No report of recurrent bleeding. Has continue on mesalamine suppository. Last Hgb 11.5 Dec 3rd  Plan Will ask GI re: duration of treatment with mesalamine

## 2012-09-01 NOTE — Assessment & Plan Note (Signed)
Patient at last hospitalization clearly stated she was not "giving up." Since being home she has been eating on a regular basis - averaging 50% of meals and she continues to take supplements. Her weight is back up to 94 lbs. Her family is very supportive and encourages her to eat.

## 2012-09-02 ENCOUNTER — Telehealth: Payer: Self-pay | Admitting: *Deleted

## 2012-09-02 NOTE — Telephone Encounter (Signed)
Called pt's daughter and told her per Dr. Russella Dar, GI dr, she may stope the mesalamine (Canasa) suppositories.

## 2012-09-04 ENCOUNTER — Telehealth: Payer: Self-pay | Admitting: *Deleted

## 2012-09-04 NOTE — Telephone Encounter (Signed)
Code status previously established. Put out of Facility DNR in mail.

## 2012-09-04 NOTE — Telephone Encounter (Signed)
Pt's daughter called requesting a signed DNR form for pt's home to have on file as suggested by the [facility].

## 2012-09-05 ENCOUNTER — Telehealth: Payer: Self-pay | Admitting: *Deleted

## 2012-09-05 MED ORDER — CIPROFLOXACIN HCL 250 MG PO TABS: 250.0000 mg | ORAL_TABLET | Freq: Two times a day (BID) | ORAL | Status: DC

## 2012-09-05 NOTE — Telephone Encounter (Signed)
[facility] PTA called stating that pt is weak and confused. Pt did not get any sleep last night and was up frequently throughout the night using the bathroom frequently-pt again this morning is weak and is asking for water and using the bathroom frequently. BP today is 108/60 and temperature is 92.3. PTA is concerned about low temp, asking MD's advisement on whether pt needs to come in today for OV.  (Can callback PTA or pt's daughter Darel Hong who is at home with [phone])

## 2012-09-05 NOTE — Telephone Encounter (Signed)
Urinary frequency with mental status changes suggestive of URI. Plan - Cipro 250 mg bid x 7. If she doesn't improve or if she gets worse than Urgent OV or ED evaluation.

## 2012-09-05 NOTE — Telephone Encounter (Signed)
Pt's daughter informed copy of DNR mailed.

## 2012-09-05 NOTE — Telephone Encounter (Signed)
Pt's daughter informed of MD's advisement and new rx-also informed HH PTA of MD's advisement via VM and to callback office with any questions/concerns.

## 2012-09-06 ENCOUNTER — Emergency Department (HOSPITAL_COMMUNITY): Payer: Medicare HMO

## 2012-09-06 ENCOUNTER — Observation Stay (HOSPITAL_COMMUNITY): Payer: Medicare HMO

## 2012-09-06 ENCOUNTER — Inpatient Hospital Stay (HOSPITAL_COMMUNITY)
Admission: EM | Admit: 2012-09-06 | Discharge: 2012-09-10 | DRG: 314 | Disposition: A | Discharge status: Home / Self Care | Payer: Medicare HMO | Attending: Internal Medicine | Admitting: Internal Medicine

## 2012-09-06 DIAGNOSIS — Z7982 Long term (current) use of aspirin: Secondary | ICD-10-CM

## 2012-09-06 DIAGNOSIS — I10 Essential (primary) hypertension: Secondary | ICD-10-CM | POA: Diagnosis present

## 2012-09-06 DIAGNOSIS — M545 Low back pain: Secondary | ICD-10-CM

## 2012-09-06 DIAGNOSIS — D62 Acute posthemorrhagic anemia: Secondary | ICD-10-CM

## 2012-09-06 DIAGNOSIS — E039 Hypothyroidism, unspecified: Secondary | ICD-10-CM | POA: Diagnosis present

## 2012-09-06 DIAGNOSIS — R627 Adult failure to thrive: Secondary | ICD-10-CM | POA: Diagnosis present

## 2012-09-06 DIAGNOSIS — K219 Gastro-esophageal reflux disease without esophagitis: Secondary | ICD-10-CM

## 2012-09-06 DIAGNOSIS — J301 Allergic rhinitis due to pollen: Secondary | ICD-10-CM

## 2012-09-06 DIAGNOSIS — K648 Other hemorrhoids: Secondary | ICD-10-CM | POA: Diagnosis present

## 2012-09-06 DIAGNOSIS — K5730 Diverticulosis of large intestine without perforation or abscess without bleeding: Secondary | ICD-10-CM

## 2012-09-06 DIAGNOSIS — I428 Other cardiomyopathies: Secondary | ICD-10-CM | POA: Diagnosis present

## 2012-09-06 DIAGNOSIS — E43 Unspecified severe protein-calorie malnutrition: Secondary | ICD-10-CM | POA: Diagnosis present

## 2012-09-06 DIAGNOSIS — R1310 Dysphagia, unspecified: Secondary | ICD-10-CM

## 2012-09-06 DIAGNOSIS — Z66 Do not resuscitate: Secondary | ICD-10-CM | POA: Diagnosis present

## 2012-09-06 DIAGNOSIS — R031 Nonspecific low blood-pressure reading: Principal | ICD-10-CM | POA: Diagnosis present

## 2012-09-06 DIAGNOSIS — R64 Cachexia: Secondary | ICD-10-CM | POA: Diagnosis present

## 2012-09-06 DIAGNOSIS — R55 Syncope and collapse: Secondary | ICD-10-CM

## 2012-09-06 DIAGNOSIS — Z515 Encounter for palliative care: Secondary | ICD-10-CM

## 2012-09-06 DIAGNOSIS — N289 Disorder of kidney and ureter, unspecified: Secondary | ICD-10-CM | POA: Diagnosis present

## 2012-09-06 DIAGNOSIS — I209 Angina pectoris, unspecified: Secondary | ICD-10-CM | POA: Diagnosis present

## 2012-09-06 DIAGNOSIS — E871 Hypo-osmolality and hyponatremia: Secondary | ICD-10-CM | POA: Diagnosis present

## 2012-09-06 DIAGNOSIS — J309 Allergic rhinitis, unspecified: Secondary | ICD-10-CM | POA: Diagnosis present

## 2012-09-06 LAB — COMPREHENSIVE METABOLIC PANEL
ALT: 15 U/L (ref 0–35)
BUN: 13 mg/dL (ref 6–23)
CO2: 25 mEq/L (ref 19–32)
Calcium: 9.3 mg/dL (ref 8.4–10.5)
GFR calc Af Amer: 60 mL/min — ABNORMAL LOW (ref >90)
GFR calc non Af Amer: 52 mL/min — ABNORMAL LOW (ref >90)
Glucose, Bld: 124 mg/dL — ABNORMAL HIGH (ref 70–99)
Sodium: 126 mEq/L — ABNORMAL LOW (ref 135–145)
Total Protein: 6.5 g/dL (ref 6.0–8.3)

## 2012-09-06 LAB — CBC WITH DIFFERENTIAL/PLATELET
Eosinophils Absolute: 0 10*3/uL (ref 0.0–0.7)
Eosinophils Relative: 1 % (ref 0–5)
HCT: 28 % — ABNORMAL LOW (ref 36.0–46.0)
Hemoglobin: 9.7 g/dL — ABNORMAL LOW (ref 12.0–15.0)
Lymphocytes Relative: 13 % (ref 12–46)
Lymphs Abs: 0.5 10*3/uL — ABNORMAL LOW (ref 0.7–4.0)
MCH: 28.6 pg (ref 26.0–34.0)
MCV: 82.6 fL (ref 78.0–100.0)
Monocytes Absolute: 0.3 10*3/uL (ref 0.1–1.0)
Monocytes Relative: 8 % (ref 3–12)
RBC: 3.39 MIL/uL — ABNORMAL LOW (ref 3.87–5.11)
WBC: 4.2 10*3/uL (ref 4.0–10.5)

## 2012-09-06 LAB — URINALYSIS, ROUTINE W REFLEX MICROSCOPIC
Bilirubin Urine: NEGATIVE
Ketones, ur: NEGATIVE mg/dL
Leukocytes, UA: NEGATIVE
Nitrite: NEGATIVE
Specific Gravity, Urine: 1.012 (ref 1.005–1.030)
Urobilinogen, UA: 0.2 mg/dL (ref 0.0–1.0)
pH: 7 (ref 5.0–8.0)

## 2012-09-06 MED ORDER — ALUM & MAG HYDROXIDE-SIMETH 200-200-20 MG/5ML PO SUSP: 30.0000 mL | Freq: Four times a day (QID) | ORAL | Status: DC | PRN

## 2012-09-06 MED ORDER — HYDROCODONE-ACETAMINOPHEN 5-325 MG PO TABS
1.0000 | ORAL_TABLET | Freq: Four times a day (QID) | ORAL | Status: DC | PRN
  Administered 2012-09-07: 1 via ORAL
  Filled 2012-09-06: qty 1

## 2012-09-06 MED ORDER — LEVOTHYROXINE SODIUM 88 MCG PO TABS
88.0000 ug | ORAL_TABLET | Freq: Every day | ORAL | Status: DC
  Administered 2012-09-08 – 2012-09-10 (×3): 88 ug via ORAL
  Filled 2012-09-06 (×6): qty 1

## 2012-09-06 MED ORDER — ISOSORBIDE MONONITRATE ER 30 MG PO TB24
30.0000 mg | ORAL_TABLET | Freq: Every day | ORAL | Status: DC
  Administered 2012-09-07 – 2012-09-10 (×4): 30 mg via ORAL
  Filled 2012-09-06 (×5): qty 1

## 2012-09-06 MED ORDER — PANTOPRAZOLE SODIUM 40 MG PO TBEC
40.0000 mg | DELAYED_RELEASE_TABLET | Freq: Every day | ORAL | Status: DC
  Administered 2012-09-07 – 2012-09-10 (×4): 40 mg via ORAL
  Filled 2012-09-06 (×4): qty 1

## 2012-09-06 MED ORDER — ONDANSETRON HCL 4 MG/2ML IJ SOLN: 4.0000 mg | Freq: Three times a day (TID) | INTRAMUSCULAR | Status: AC | PRN

## 2012-09-06 MED ORDER — SENNOSIDES-DOCUSATE SODIUM 8.6-50 MG PO TABS
1.0000 | ORAL_TABLET | Freq: Every evening | ORAL | Status: DC | PRN
  Filled 2012-09-06: qty 1

## 2012-09-06 MED ORDER — LORAZEPAM 2 MG/ML IJ SOLN
0.5000 mg | Freq: Four times a day (QID) | INTRAMUSCULAR | Status: DC | PRN
  Administered 2012-09-07 (×2): 0.5 mg via INTRAVENOUS
  Filled 2012-09-06 (×2): qty 1

## 2012-09-06 MED ORDER — GUAIFENESIN-DM 100-10 MG/5ML PO SYRP: 5.0000 mL | ORAL_SOLUTION | ORAL | Status: DC | PRN

## 2012-09-06 MED ORDER — SODIUM CHLORIDE 0.9 % IV SOLN: INTRAVENOUS | Status: DC

## 2012-09-06 MED ORDER — B COMPLEX-C PO TABS
1.0000 | ORAL_TABLET | Freq: Every day | ORAL | Status: DC
  Administered 2012-09-07 – 2012-09-10 (×4): 1 via ORAL
  Filled 2012-09-06 (×6): qty 1

## 2012-09-06 MED ORDER — ALBUTEROL SULFATE (5 MG/ML) 0.5% IN NEBU: 2.5000 mg | INHALATION_SOLUTION | RESPIRATORY_TRACT | Status: DC | PRN

## 2012-09-06 MED ORDER — SERTRALINE HCL 25 MG PO TABS
25.0000 mg | ORAL_TABLET | Freq: Every day | ORAL | Status: DC
  Administered 2012-09-07 – 2012-09-08 (×2): 25 mg via ORAL
  Filled 2012-09-06 (×3): qty 1

## 2012-09-06 MED ORDER — NITROGLYCERIN 0.4 MG SL SUBL: 0.4000 mg | SUBLINGUAL_TABLET | SUBLINGUAL | Status: DC | PRN

## 2012-09-06 MED ORDER — ASPIRIN EC 81 MG PO TBEC
81.0000 mg | DELAYED_RELEASE_TABLET | ORAL | Status: DC
  Administered 2012-09-09: 81 mg via ORAL
  Filled 2012-09-06 (×2): qty 1

## 2012-09-06 MED ORDER — POLYETHYLENE GLYCOL 3350 17 G PO PACK
17.0000 g | PACK | Freq: Every day | ORAL | Status: DC
  Administered 2012-09-07 – 2012-09-10 (×4): 17 g via ORAL
  Filled 2012-09-06 (×5): qty 1

## 2012-09-06 MED ORDER — LISINOPRIL 10 MG PO TABS
10.0000 mg | ORAL_TABLET | Freq: Every day | ORAL | Status: DC
  Administered 2012-09-07 – 2012-09-10 (×4): 10 mg via ORAL
  Filled 2012-09-06 (×5): qty 1

## 2012-09-06 MED ORDER — SODIUM CHLORIDE 0.9 % IV BOLUS (SEPSIS)
500.0000 mL | Freq: Once | INTRAVENOUS | Status: AC
  Administered 2012-09-06: 500 mL via INTRAVENOUS

## 2012-09-06 MED ORDER — METOPROLOL TARTRATE 25 MG PO TABS
25.0000 mg | ORAL_TABLET | Freq: Two times a day (BID) | ORAL | Status: DC
  Administered 2012-09-07 – 2012-09-08 (×2): 25 mg via ORAL
  Filled 2012-09-06 (×5): qty 1

## 2012-09-06 MED ORDER — CHLORHEXIDINE GLUCONATE 0.12 % MT SOLN
15.0000 mL | Freq: Two times a day (BID) | OROMUCOSAL | Status: DC
  Administered 2012-09-07 – 2012-09-10 (×6): 15 mL via OROMUCOSAL
  Filled 2012-09-06 (×10): qty 15

## 2012-09-06 MED ORDER — BIOTENE DRY MOUTH MT LIQD
15.0000 mL | Freq: Two times a day (BID) | OROMUCOSAL | Status: DC
  Administered 2012-09-07 – 2012-09-10 (×7): 15 mL via OROMUCOSAL

## 2012-09-06 MED ORDER — LISINOPRIL-HYDROCHLOROTHIAZIDE 10-12.5 MG PO TABS: 1.0000 | ORAL_TABLET | Freq: Every day | ORAL | Status: DC

## 2012-09-06 MED ORDER — SODIUM CHLORIDE 0.9 % IV SOLN
INTRAVENOUS | Status: AC
  Administered 2012-09-06: 23:00:00 via INTRAVENOUS

## 2012-09-06 MED ORDER — SODIUM CHLORIDE 0.9 % IV SOLN
INTRAVENOUS | Status: DC
  Administered 2012-09-06: 19:00:00 via INTRAVENOUS

## 2012-09-06 MED ORDER — ONDANSETRON HCL 4 MG/2ML IJ SOLN: 4.0000 mg | Freq: Four times a day (QID) | INTRAMUSCULAR | Status: DC | PRN

## 2012-09-06 MED ORDER — SUCRALFATE 1 GM/10ML PO SUSP
1.0000 g | Freq: Four times a day (QID) | ORAL | Status: DC
  Administered 2012-09-07 – 2012-09-10 (×13): 1 g via ORAL
  Filled 2012-09-06 (×17): qty 10

## 2012-09-06 MED ORDER — HYDROCHLOROTHIAZIDE 12.5 MG PO CAPS
12.5000 mg | ORAL_CAPSULE | Freq: Every day | ORAL | Status: DC
  Administered 2012-09-07 – 2012-09-10 (×4): 12.5 mg via ORAL
  Filled 2012-09-06 (×5): qty 1

## 2012-09-06 MED ORDER — ONDANSETRON HCL 4 MG PO TABS: 4.0000 mg | ORAL_TABLET | Freq: Four times a day (QID) | ORAL | Status: DC | PRN

## 2012-09-06 MED ORDER — SODIUM CHLORIDE 0.9 % IJ SOLN
3.0000 mL | Freq: Two times a day (BID) | INTRAMUSCULAR | Status: DC
  Administered 2012-09-07 – 2012-09-10 (×6): 3 mL via INTRAVENOUS

## 2012-09-06 NOTE — ED Notes (Signed)
Family at bedside- granddaughter states on mechanical soft, liquids through a straw- thin liquids, needs assistance

## 2012-09-06 NOTE — ED Notes (Signed)
Per EMS, patient was found down by family member.   Patient was unresponsive when they arrived, but when they went to put patient on the stretcher, patient "woke up and was at baseline".

## 2012-09-06 NOTE — ED Provider Notes (Signed)
History     CSN: [id number]  Arrival date & time 09/06/12  1343   First MD Initiated Contact with Patient 09/06/12 1459      Chief Complaint  Patient presents with  . Loss of Consciousness    (Consider location/radiation/quality/duration/timing/severity/associated sxs/prior treatment) HPI Comments: Patient arrives via EMS after being found slumped over in her chair witnessed by her granddaughter. She staring off and not responding for several minutes. She is not responding but confused. Grandmother reports no seizure activity. No fever, chills, chest pain or shortness of breath. Started on antibiotic for urinary tract infection yesterday. Patient admitted on November 29 hyponatremic seizure. Family reports she has been eating and drinking well.  Patient denies pain.  She is oriented to self.  Level 5 caveat: AMS  The history is provided by the patient and the EMS personnel. The history is limited by the condition of the patient.    Past Medical History  Diagnosis Date  . Internal hemorrhoid   . Diverticulosis of colon (without mention of hemorrhage)   . Herpes zoster   . Glaucoma(365)   . Lumbar back pain   . Hypertension   . Cardiomyopathy   . Hypothyroid     Past Surgical History  Procedure Date  . Thyroidectomy   . Appendectomy   . Tubal ligation   . Colonoscopy 07/19/2012    Procedure: COLONOSCOPY;  Surgeon: Meryl Dare, MD,FACG;  Location: [facility];  Service: Endoscopy;  Laterality: N/A;    No family history on file.  History  Substance Use Topics  . Smoking status: Never Smoker   . Smokeless tobacco: Never Used  . Alcohol Use: No    OB History    Grav Para Term Preterm Abortions TAB SAB Ect Mult Living                  Review of Systems  Unable to perform ROS: Mental status change  Cardiovascular: Positive for syncope.    Allergies  Review of patient's allergies indicates no known allergies.  Home Medications   No current outpatient  prescriptions on file.  BP 151/78  Pulse 78  Temp 97.8 F (36.6 C) (Oral)  Resp 18  SpO2 98%  Physical Exam  Constitutional: She appears well-developed and well-nourished. No distress.  HENT:  Head: Normocephalic and atraumatic.  Mouth/Throat: Oropharynx is clear and moist. No oropharyngeal exudate.       R corneal opacity  Eyes: Conjunctivae normal and EOM are normal. Pupils are equal, round, and reactive to light.  Neck: Normal range of motion. Neck supple.  Cardiovascular: Normal rate, regular rhythm and normal heart sounds.   No murmur heard. Pulmonary/Chest: Effort normal and breath sounds normal. No respiratory distress.  Abdominal: There is no tenderness. There is no rebound and no guarding.  Musculoskeletal: Normal range of motion. She exhibits no edema and no tenderness.  Neurological: She is alert. No cranial nerve deficit. She exhibits normal muscle tone. Coordination normal.       No facial droop, 5/5 strength throughout, follows commands and move all extremities equally  Skin: Skin is warm.    ED Course  Procedures (including critical care time)  Labs Reviewed  GLUCOSE, CAPILLARY - Abnormal; Notable for the following:    Glucose-Capillary 120 (*)     All other components within normal limits  CBC WITH DIFFERENTIAL - Abnormal; Notable for the following:    RBC 3.39 (*)     Hemoglobin 9.7 (*)  HCT 28.0 (*)     Neutrophils Relative 79 (*)     Lymphs Abs 0.5 (*)     All other components within normal limits  COMPREHENSIVE METABOLIC PANEL - Abnormal; Notable for the following:    Sodium 126 (*)     Chloride 90 (*)     Glucose, Bld 124 (*)     Albumin 3.1 (*)     GFR calc non Af Amer 52 (*)     GFR calc Af Amer 60 (*)     All other components within normal limits  URINALYSIS, ROUTINE W REFLEX MICROSCOPIC  TROPONIN I  URINE CULTURE  SODIUM, URINE, RANDOM  CREATININE, URINE, RANDOM  OSMOLALITY  TSH  PREALBUMIN  BASIC METABOLIC PANEL  CBC   Dg Chest  2 View  09/06/2012  *RADIOLOGY REPORT*  Clinical Data: Weakness, not sleeping well  CHEST - 2 VIEW  Comparison: Portable chest x-ray of 08/16/2012  Findings: The lungs are hyperaerated with increased AP diameter consistent with emphysema.  No focal infiltrate or effusion is seen.  Cardiomegaly is stable.  The bones are osteopenic and there are minimal mid thoracic vertebral compressions noted.  IMPRESSION: No active lung disease.  Cardiomegaly.  Hyperaeration consistent with emphysema.   Original Report Authenticated By: Dwyane Dee, M.D.    Ct Head Wo Contrast  09/06/2012  *RADIOLOGY REPORT*  Clinical Data: Patient unresponsive.  CT HEAD WITHOUT CONTRAST  Technique:  Contiguous axial images were obtained from the base of the skull through the vertex without contrast.  Comparison: Head CT scan 08/16/2012.  Findings: There is cortical atrophy and chronic microvascular ischemic change, stable in appearance.  No evidence of acute abnormality including infarct, hemorrhage, mass lesion, mass effect, midline shift or abnormal extra-axial fluid collection.  No hydrocephalus or pneumocephalus.  Calvarium intact.  IMPRESSION: No acute finding.  Stable compared to prior exam.   Original Report Authenticated By: Holley Dexter, M.D.    Dg Abd Portable 1v  09/06/2012  *RADIOLOGY REPORT*  Clinical Data: Pain.  PORTABLE ABDOMEN - 1 VIEW  Comparison: CT abdomen pelvis 07/16/2012.  Chest and two views abdomen 07/02/2011.  Findings: The bowel gas pattern is nonobstructive.  No unexpected abdominal calcification is identified.  No focal bony abnormality.  IMPRESSION: Negative study.   Original Report Authenticated By: Holley Dexter, M.D.      1. Hyponatremia   2. Syncope   3. Adult failure to thrive   4. Allergic rhinitis   5. Anemia associated with acute blood loss       MDM  Episode of staring off and nonresponsiveness lasting a few minutes. No seizure activity, tongue biting or urinary  incontinence.  Nonfocal neurological exam with diffuse generalized weakness.  Hyponatremia similar to previous of 126. Normal kidney function.  Patient appears cachectic and malnourished. Unclear if she had syncope or seizure. Had similar fashion last month for hyponatremia. To benefit from IV fluids and further workup of her episode of unresponsiveness.   Date: 09/06/2012  Rate: 63  Rhythm: normal sinus rhythm  QRS Axis: normal  Intervals: normal  ST/T Wave abnormalities: nonspecific ST/T changes  Conduction Disutrbances:none  Narrative Interpretation: Unchanged inferior lateral T-wave inversions  Old EKG Reviewed: unchanged          Glynn Octave, MD 09/06/12 2320

## 2012-09-06 NOTE — ED Notes (Signed)
Admitting MD at bedside.

## 2012-09-06 NOTE — H&P (Signed)
[facility]                                                                                    Patient Demographics  [name], is a 78 y.o. female  CSN: [id number]  MRN: [id number]  DOB - 1917-07-20  Admit Date - 09/06/2012  Outpatient Primary MD for the patient is Illene Regulus, MD   With History of -  Past Medical History  Diagnosis Date  . Internal hemorrhoid   . Diverticulosis of colon (without mention of hemorrhage)   . Herpes zoster   . Glaucoma(365)   . Lumbar back pain   . Hypertension   . Cardiomyopathy   . Hypothyroid       Past Surgical History  Procedure Date  . Thyroidectomy   . Appendectomy   . Tubal ligation   . Colonoscopy 07/19/2012    Procedure: COLONOSCOPY;  Surgeon: Meryl Dare, MD,FACG;  Location: [facility];  Service: Endoscopy;  Laterality: N/A;    in for   Chief Complaint  Patient presents with  . Loss of Consciousness     HPI  [name]  is a 78 y.o. female, was extremely frail and cachectic at baseline, has documented failure to thrive, questionable seizure history in the past, chronic hyponatremia in the range of 125 to 130, diverticulosis with bleeding in the past, hypertension, cardiomyopathy, hypothyroidism who was recently admitted about one month ago failure to thrive, severe protein calorie malnutrition, questionable seizure and hyponatremia. She was brought in the hospital by family members after she had an episode lasting a few minutes of decreased mental status with no witnessed seizure like activity but apparently patient was staring and was unresponsive for a few minutes, there was no frothing or tongue bite, no bowel bladder incontinence or shaking activity. In the ER she was found to be mildly hyponatremic, CT of the head and chest x-ray were stable, UA was stable, however she did have 2 episodes of vomiting, currently besides generalized weakness her other review of systems is negative.  Patient  is a poor historian extremely frail however she denies any headache chest pain or belly pain, moves all 4 extremities to commands and answers BASIC questions. Family at this time is not available.    Review of Systems  Limited review of systems as patient appears to be a poor historian  In addition to the HPI above,  No Fever-chills, No Headache, No changes with Vision or hearing, No problems swallowing food or Liquids, No Chest pain, Cough or Shortness of Breath, No Abdominal pain, also to Nausea and Vommitting, Bowel movements are regular, No Blood in stool or Urine, No dysuria, No new skin rashes or bruises, No new joints pains-aches,  No new weakness, tingling, numbness in any extremity, diffuse chronic generalized weakness No recent weight gain or loss, No polyuria, polydypsia or polyphagia, No significant Mental Stressors.  A full 10 point Review of Systems was done, except as stated above, all other Review of Systems were negative.   Social History History  Substance Use Topics  . Smoking status: Never Smoker   . Smokeless tobacco: Never Used  . Alcohol Use:  No    Family History No history of seizures or in the family   Prior to Admission medications   Medication Sig Start Date End Date Taking? Authorizing Provider  B Complex-C (B-COMPLEX WITH VITAMIN C) tablet Take 1 tablet by mouth daily.   Yes Historical Provider, MD  ciprofloxacin (CIPRO) 250 MG tablet Take 250 mg by mouth 2 (two) times daily. Started 09-05-12 for UTI   Yes Jacques Navy, MD  fluticasone (VERAMYST) 27.5 MCG/SPRAY nasal spray Place 2 sprays into the nose daily as needed. For allergies   Yes Historical Provider, MD  isosorbide mononitrate (IMDUR) 30 MG 24 hr tablet Take 30 mg by mouth daily.   Yes Historical Provider, MD  levothyroxine (SYNTHROID, LEVOTHROID) 88 MCG tablet Take 88 mcg by mouth daily.   Yes Historical Provider, MD  lisinopril-hydrochlorothiazide (PRINZIDE,ZESTORETIC) 10-12.5 MG  per tablet Take 1 tablet by mouth daily.   Yes Historical Provider, MD  nitroGLYCERIN (NITROSTAT) 0.4 MG SL tablet Place 0.4 mg under the tongue every 5 (five) minutes as needed. Chest pains 02/06/12  Yes Jacques Navy, MD  pantoprazole (PROTONIX) 40 MG tablet Take 40 mg by mouth daily.   Yes Historical Provider, MD  polyethylene glycol (MIRALAX / GLYCOLAX) packet Take 17 g by mouth daily as needed. For constipation   Yes Historical Provider, MD  sucralfate (CARAFATE) 1 GM/10ML suspension Take 1 g by mouth 4 (four) times daily. Before meals and bedtime   Yes Historical Provider, MD  aspirin EC 81 MG tablet Take 81 mg by mouth every Monday, Wednesday, and Friday. 08/29/12   Jacques Navy, MD  sertraline (ZOLOFT) 25 MG tablet Take 25 mg by mouth daily.    Historical Provider, MD    No Known Allergies  Physical Exam  Vitals  Blood pressure 154/66, pulse 62, temperature 97.6 F (36.4 C), temperature source Oral, resp. rate 16, SpO2 100.00%.   1. General extremely frail cachectic-appearing elderly African American  female lying in bed in NAD,    2. Normal affect and insight, Not Suicidal or Homicidal, Awake Alert,  3. No F.N deficits, ALL C.Nerves Intact, extreme diffuse generalized weakness all over Strength 4/5 all 4 extremities, Sensation intact all 4 extremities, Plantars down going.  4. Ears and Eyes appear Normal, Conjunctivae clear, PERRLA. Moist Oral Mucosa.  5. Supple Neck, No JVD, No cervical lymphadenopathy appriciated, No Carotid Bruits.  6. Symmetrical Chest wall movement, Good air movement bilaterally, CTAB.  7. RRR, No Gallops, Rubs or Murmurs, No Parasternal Heave.  8. Positive Bowel Sounds, Abdomen Soft, Non tender, No organomegaly appriciated,No rebound -guarding or rigidity.  9.  No Cyanosis, Normal Skin Turgor, No Skin Rash or Bruise.  10. Good muscle tone,  joints appear normal , no effusions, Normal ROM.  11. No Palpable Lymph Nodes in Neck or Axillae      Data Review  CBC  Lab 09/06/12 1440  WBC 4.2  HGB 9.7*  HCT 28.0*  PLT 280  MCV 82.6  MCH 28.6  MCHC 34.6  RDW 15.0  LYMPHSABS 0.5*  MONOABS 0.3  EOSABS 0.0  BASOSABS 0.0  BANDABS --   ------------------------------------------------------------------------------------------------------------------  Chemistries   Lab 09/06/12 1440  NA 126*  K 4.2  CL 90*  CO2 25  GLUCOSE 124*  BUN 13  CREATININE 0.92  CALCIUM 9.3  MG --  AST 25  ALT 15  ALKPHOS 69  BILITOT 0.3   ------------------------------------------------------------------------------------------------------------------ CrCl is unknown because both a height and weight (above a  minimum accepted value) are required for this calculation. ------------------------------------------------------------------------------------------------------------------ No results found for this basename: TSH,T4TOTAL,FREET3,T3FREE,THYROIDAB in the last 72 hours   Coagulation profile No results found for this basename: INR:5,PROTIME:5 in the last 168 hours ------------------------------------------------------------------------------------------------------------------- No results found for this basename: DDIMER:2 in the last 72 hours -------------------------------------------------------------------------------------------------------------------  Cardiac Enzymes  Lab 09/06/12 1441  CKMB --  TROPONINI <0.30  MYOGLOBIN --   ------------------------------------------------------------------------------------------------------------------ No components found with this basename: POCBNP:3   ---------------------------------------------------------------------------------------------------------------  Urinalysis    Component Value Date/Time   COLORURINE YELLOW 09/06/2012 1505   APPEARANCEUR CLEAR 09/06/2012 1505   LABSPEC 1.012 09/06/2012 1505   PHURINE 7.0 09/06/2012 1505   GLUCOSEU NEGATIVE 09/06/2012 1505    HGBUR NEGATIVE 09/06/2012 1505   BILIRUBINUR NEGATIVE 09/06/2012 1505   KETONESUR NEGATIVE 09/06/2012 1505   PROTEINUR NEGATIVE 09/06/2012 1505   UROBILINOGEN 0.2 09/06/2012 1505   NITRITE NEGATIVE 09/06/2012 1505   LEUKOCYTESUR NEGATIVE 09/06/2012 1505    ----------------------------------------------------------------------------------------------------------------  Imaging results:     Dg Chest 2 View  09/06/2012  *RADIOLOGY REPORT*  Clinical Data: Weakness, not sleeping well  CHEST - 2 VIEW  Comparison: Portable chest x-ray of 08/16/2012  Findings: The lungs are hyperaerated with increased AP diameter consistent with emphysema.  No focal infiltrate or effusion is seen.  Cardiomegaly is stable.  The bones are osteopenic and there are minimal mid thoracic vertebral compressions noted.  IMPRESSION: No active lung disease.  Cardiomegaly.  Hyperaeration consistent with emphysema.   Original Report Authenticated By: Dwyane Dee, M.D.    Ct Head Wo Contrast  09/06/2012  *RADIOLOGY REPORT*  Clinical Data: Patient unresponsive.  CT HEAD WITHOUT CONTRAST  Technique:  Contiguous axial images were obtained from the base of the skull through the vertex without contrast.  Comparison: Head CT scan 08/16/2012.  Findings: There is cortical atrophy and chronic microvascular ischemic change, stable in appearance.  No evidence of acute abnormality including infarct, hemorrhage, mass lesion, mass effect, midline shift or abnormal extra-axial fluid collection.  No hydrocephalus or pneumocephalus.  Calvarium intact.  IMPRESSION: No acute finding.  Stable compared to prior exam.   Original Report Authenticated By: Holley Dexter, M.D.        My personal review of EKG: Rhythm NSR, Rate  63 /min, old T wave changes Inf-Lat leads    Assessment & Plan   1. Episode of decreased mental status syncope versus seizure - patient had recent admission which was similar, that point she had EEG which did not pick  ongoing seizure activity, she is close to her baseline mental status at this time, answering basic questions and following basic commands, does not have any focal deficits, her head CT is stable, she will be admitted on a telemetry bed, seizure precautions, due to see him advice on further line of care. I have for now ordered EEG , will monitor on telemetry, will monitor blood pressures, I doubt patient can stand for orthostatic blood pressure at this time.   2. History of Failure to thrive, severe protein calorie malnutrition, chronic hyponatremia- patient will be provided gentle IV fluids, diet as tolerated, protein supplements, check prealbumin, will have dietitian see the patient, gentle IV fluids and monitor BMP, check urine sodium osmolality along with serum osmolality.   3. History of angina, hypertension, cardiomyopathy - patient is chest pain-free, EKG is chronic T wave changes, last echogram from 2011 shows EF of 50%, blood pressure is slightly high, we'll continue home medications and add when necessary medications with written parameters  for extremely high pressures.   4. History of diverticulosis, GI bleed in the past, GERD. No acute issues continue PPI monitor H&H with gentle hydration. Expect some fall with dilution due to IV fluids.   5. Emesis in the ER, patient denies any abdominal pain, abdominal exam is stable, will check a KUB, supportive care with when necessary IV Zofran.    I question her swallowing status due to her extremely frail and weak appearance, for now patient will be admitted on dysphagia 3 diet, feeding assistance and aspiration precautions, will have speech evaluate her and as status tolerated.    Patient appears to be frail, extremely malnourished and cachectic, I agree with previous documentation by patient's primary care physician Dr. Ranell Patrick that she should be DNR/DNI, no heroic or invasive procedures should be attempted, she will definitely benefit from  palliative care/home hospice in the near future.    DVT Prophylaxis  SCDs   AM Labs Ordered, also please review Full Orders  Family Communication: Admission, patients condition and plan of care including tests being ordered have been discussed with the patient  who indicates understanding and agree with the plan and Code Status.  Code Status DNR-I  Disposition Plan: TBD  Time spent in minutes :45  Condition GUARDED   Leroy Sea M.D on 09/06/2012 at 5:15 PM  Between 7am to 7pm - Pager - [phone]  After 7pm go to [email or web address] - password [email or web address]  And look for the night coverage person covering me after hours  [facility] Office  [phone]

## 2012-09-06 NOTE — ED Notes (Signed)
Bruising noted on right side of tongue, no open areas noted, drooling, but able to swallow saliva when instructed to

## 2012-09-06 NOTE — ED Notes (Signed)
Report called to 3W.

## 2012-09-06 NOTE — ED Notes (Signed)
When patient returned from radiology, notified pt had vomiting x 2. Admitting MD at bedside, made aware. Pt denies any abdominal or feeling bad. Pt is alert at this time. Appropriate awareness.

## 2012-09-06 NOTE — Plan of Care (Signed)
Problem: Consults Goal: General Medical Patient Education See Patient Education Module for specific education. Outcome: Not Progressing Unable to educate patient; dementia with poor memory. Family/caregiver not present. Goal: Nutrition Consult-if indicated Outcome: Progressing Strict NPO for speech language pathology swallow exam.  Problem: Phase I Progression Outcomes Goal: OOB as tolerated unless otherwise ordered Outcome: Not Progressing Global weakness noted.  Hyponatremia with urinary frequency/urgency/incontinence.

## 2012-09-07 ENCOUNTER — Other Ambulatory Visit (HOSPITAL_COMMUNITY): Payer: Medicare HMO

## 2012-09-07 DIAGNOSIS — R627 Adult failure to thrive: Secondary | ICD-10-CM

## 2012-09-07 DIAGNOSIS — I10 Essential (primary) hypertension: Secondary | ICD-10-CM

## 2012-09-07 DIAGNOSIS — E871 Hypo-osmolality and hyponatremia: Secondary | ICD-10-CM

## 2012-09-07 DIAGNOSIS — N289 Disorder of kidney and ureter, unspecified: Secondary | ICD-10-CM

## 2012-09-07 LAB — BASIC METABOLIC PANEL
BUN: 11 mg/dL (ref 6–23)
Calcium: 9.4 mg/dL (ref 8.4–10.5)
GFR calc non Af Amer: 61 mL/min — ABNORMAL LOW (ref >90)
Glucose, Bld: 92 mg/dL (ref 70–99)
Sodium: 133 mEq/L — ABNORMAL LOW (ref 135–145)

## 2012-09-07 LAB — CBC
MCH: 28.4 pg (ref 26.0–34.0)
MCHC: 34.2 g/dL (ref 30.0–36.0)
Platelets: 294 10*3/uL (ref 150–400)
RBC: 3.41 MIL/uL — ABNORMAL LOW (ref 3.87–5.11)
RDW: 15.1 % (ref 11.5–15.5)

## 2012-09-07 MED ORDER — ENSURE COMPLETE PO LIQD
237.0000 mL | Freq: Two times a day (BID) | ORAL | Status: DC
  Administered 2012-09-07 – 2012-09-10 (×6): 237 mL via ORAL

## 2012-09-07 NOTE — Progress Notes (Signed)
Patient had a 2.06 pause at 10:06 on 09/07/12.  Patient was asleep at the time.  Dr Debby Bud made aware, no new orders received at this time.  Will continue to monitor patient.

## 2012-09-07 NOTE — Progress Notes (Signed)
Subjective: [name] has been in decline for several months and has been more rapidly declining since her last bleed. Her last admission was for LOC and FTT. Eval was negative for seizure. She had swallow eval and was able to tolerate pureed diet and supplement but at last office visit her PO intake was intermittent but generally poor. She now presents with change in mental status and increase weakness. She had normal CT brain, KJB, CXR. She remains very weak but does not appear uncomfortable and denies pain when aroused.  Objective: Lab: Lab Results  Component Value Date   WBC 4.1 09/07/2012   HGB 9.7* 09/07/2012   HCT 28.4* 09/07/2012   MCV 83.3 09/07/2012   PLT 294 09/07/2012   BMET    Component Value Date/Time   NA 133* 09/07/2012 0540   K 4.3 09/07/2012 0540   CL 97 09/07/2012 0540   CO2 26 09/07/2012 0540   GLUCOSE 92 09/07/2012 0540   BUN 11 09/07/2012 0540   CREATININE 0.81 09/07/2012 0540   CALCIUM 9.4 09/07/2012 0540   GFRNONAA 61* 09/07/2012 0540   GFRAA 70* 09/07/2012 0540     Imaging:  Scheduled Meds:   . antiseptic oral rinse  15 mL Mouth Rinse q12n4p  . aspirin EC  81 mg Oral Q M,W,F  . B-complex with vitamin C  1 tablet Oral Daily  . chlorhexidine  15 mL Mouth Rinse BID  . lisinopril  10 mg Oral Daily   And  . hydrochlorothiazide  12.5 mg Oral Daily  . isosorbide mononitrate  30 mg Oral Daily  . levothyroxine  88 mcg Oral QAC breakfast  . metoprolol tartrate  25 mg Oral BID  . pantoprazole  40 mg Oral Daily  . polyethylene glycol  17 g Oral Daily  . sertraline  25 mg Oral Daily  . sodium chloride  3 mL Intravenous Q12H  . sucralfate  1 g Oral QID   Continuous Infusions:   . sodium chloride 50 mL/hr at 09/06/12 2235   PRN Meds:.albuterol, alum & mag hydroxide-simeth, guaiFENesin-dextromethorphan, HYDROcodone-acetaminophen, LORazepam, nitroGLYCERIN, ondansetron (ZOFRAN) IV, ondansetron, senna-docusate   Physical Exam: Filed Vitals:   09/07/12  0500  BP: 167/74  Pulse: 61  Temp: 97.2 F (36.2 C)  Resp: 18   Gen'l- emaciated AA woman very elderly minimally responsive HEENT- tempora wasting Cor - heart sounds are distant but regular Pul - shallow inspirations, no increased WOB, no rales Abd - scaphoid, not tender Neuro - barely arousable, very weak.       Assessment/Plan: 1. Change in mental status - multi-factorial but seizure unlikely. This is a combination of chrfonic issue with FTT. PLan - d/c order for repeat EEG  2. FTT- progressive weakness and inanition. No acute problems have been identified. At her advanced age care must be taken in regard to aggressive eval. By previous discussion - she is a DNR   [facility] (o) [phone]; (c) [phone] Call-grp - [facility]  Tele: [phone]  09/07/2012, 9:52 AM

## 2012-09-07 NOTE — Evaluation (Signed)
Clinical/Bedside Swallow Evaluation Patient Details  Name: [name] MRN: [id number] Date of Birth: [birth year]  Today's Date: 09/07/2012 Time: 1130-1215 SLP Time Calculation (min): 45 min  Past Medical History:  Past Medical History  Diagnosis Date  . Internal hemorrhoid   . Diverticulosis of colon (without mention of hemorrhage)   . Herpes zoster   . Glaucoma(365)   . Lumbar back pain   . Hypertension   . Cardiomyopathy   . Hypothyroid    Past Surgical History:  Past Surgical History  Procedure Date  . Thyroidectomy   . Appendectomy   . Tubal ligation   . Colonoscopy 07/19/2012    Procedure: COLONOSCOPY;  Surgeon: Meryl Dare, MD,FACG;  Location: [facility];  Service: Endoscopy;  Laterality: N/A;   HPI:  [name]  is a 78 y.o. female, was extremely frail and cachectic at baseline, has documented failure to thrive, questionable seizure history in the past, chronic hyponatremia in the range of 125 to 130, diverticulosis with bleeding in the past, hypertension, cardiomyopathy, hypothyroidism who was recently admitted about one month ago failure to thrive, severe protein calorie malnutrition, questionable seizure and hyponatremia. She was brought in the hospital by family members after she had an episode lasting a few minutes of decreased mental status with no witnessed seizure like activity but apparently patient was staring and was unresponsive for a few minutes, there was no frothing or tongue bite, no bowel bladder incontinence or shaking activity. In the ER she was found to be mildly hyponatremic, CT of the head and chest x-ray were stable, UA was stable, however she did have 2 episodes of vomiting, currently besides generalized weakness her other review of systems is negative.  Patient referred for BSE to assess risk for aspiration per stroke protocol.    Assessment / Plan / Recommendation Clinical Impression  Baseline dysphagia indicated.  Minimal to moderate  oral phase dyphagia with right sided facial, labial, and lingual weakness with right anterior spillage with sips of thin liquid by spoon and cup.   Moderate verbal cues required for patient to complete oral transit with puree and liquid trials as patient with reduced oral awareness.  Slight delay in initiation of swallow with audible swallow initial sip of liquid by spoon.  No outward s/s of aspiration noted but patient required full assist with feeding.  Recommend to continue current diet of dysphagia 1 secondary to LOA and due to  dentures not available.  Continue thin liquids with full supervision with all meals to assist as warranted. ST to follow briefly in acute care setting for diet tolerance and possible advancement.     Aspiration Risk  Moderate    Diet Recommendation Dysphagia 1 (Puree);Thin liquid   Liquid Administration via: Cup;Straw Medication Administration: Crushed with puree Supervision: Full supervision/cueing for compensatory strategies Compensations: Slow rate;Small sips/bites;Check for pocketing;Follow solids with liquid Postural Changes and/or Swallow Maneuvers: Seated upright 90 degrees;Upright 30-60 min after meal    Other  Recommendations Oral Care Recommendations: Oral care QID Other Recommendations: Clarify dietary restrictions   Follow Up Recommendations  Other (comment) (TBD)    Frequency and Duration min 2x/week  2 weeks       SLP Swallow Goals Patient will consume recommended diet without observed clinical signs of aspiration with: Maximum assistance Patient will utilize recommended strategies during swallow to increase swallowing safety with: Maximum assistance   Swallow Study Prior Functional Status   Mechanical soft/thin liquids    General Date of Onset: 09/07/12 HPI: [name]  is a 78 y.o. female, was extremely frail and cachectic at baseline, has documented failure to thrive, questionable seizure history in the past, chronic hyponatremia in  the range of 125 to 130, diverticulosis with bleeding in the past, hypertension, cardiomyopathy, hypothyroidism who was recently admitted about one month ago failure to thrive, severe protein calorie malnutrition, questionable seizure and hyponatremia. She was brought in the hospital by family members after she had an episode lasting a few minutes of decreased mental status with no witnessed seizure like activity but apparently patient was staring and was unresponsive for a few minutes, there was no frothing or tongue bite, no bowel bladder incontinence or shaking activity. In the ER she was found to be mildly hyponatremic, CT of the head and chest x-ray were stable, UA was stable, however she did have 2 episodes of vomiting, currently besides generalized weakness her other review of systems is negative. Previous Swallow Assessment: BSE 08/19/12 Diet Prior to this Study: Dysphagia 1 (puree);Thin liquids Temperature Spikes Noted: No Respiratory Status: Room air History of Recent Intubation: No Behavior/Cognition: Lethargic;Cooperative Oral Cavity - Dentition: Dentures, not available;Edentulous Self-Feeding Abilities: Total assist Patient Positioning: Upright in bed Baseline Vocal Quality: Low vocal intensity;Hoarse Volitional Cough: Strong Volitional Swallow: Unable to elicit    Oral/Motor/Sensory Function Overall Oral Motor/Sensory Function: Impaired at baseline Labial ROM: Reduced right Labial Symmetry: Abnormal symmetry right Labial Strength: Reduced Labial Sensation: Reduced Lingual ROM: Reduced right Lingual Symmetry: Abnormal symmetry right Lingual Strength: Reduced Lingual Sensation: Reduced Facial ROM: Reduced right Facial Symmetry: Right droop Facial Strength: Reduced Facial Sensation: Reduced Velum: Within Functional Limits Mandible: Within Functional Limits   Ice Chips Ice chips: Not tested   Thin Liquid Thin Liquid: Impaired Presentation: Cup;Spoon;Straw Oral Phase  Impairments: Reduced labial seal Oral Phase Functional Implications: Right anterior spillage Pharyngeal  Phase Impairments: Suspected delayed Swallow    Nectar Thick Nectar Thick Liquid: Not tested   Honey Thick Honey Thick Liquid: Not tested   Puree Puree: Impaired Presentation: Spoon Oral Phase Impairments: Reduced labial seal;Impaired anterior to posterior transit Oral Phase Functional Implications: Prolonged oral transit Pharyngeal Phase Impairments: Suspected delayed Swallow   Solid   GO    Solid: Not tested      Moreen Fowler MS, CCC-SLP [phone] [facility] 09/07/2012,12:45 PM

## 2012-09-08 LAB — URINE CULTURE: Culture: NO GROWTH

## 2012-09-08 MED ORDER — HYDROCODONE-ACETAMINOPHEN 5-325 MG PO TABS
1.0000 | ORAL_TABLET | Freq: Every day | ORAL | Status: DC
  Administered 2012-09-09: 1 via ORAL
  Filled 2012-09-08: qty 1

## 2012-09-08 MED ORDER — ACETAMINOPHEN 325 MG PO TABS
650.0000 mg | ORAL_TABLET | Freq: Three times a day (TID) | ORAL | Status: DC | PRN
  Administered 2012-09-08: 650 mg via ORAL
  Filled 2012-09-08: qty 2

## 2012-09-08 NOTE — Progress Notes (Signed)
Subjective: Appreciate SLP reassessement- for puree diet. Daughter reports the patient slept well perhaps due to vicodin controlling her back pain. No other changes Objective: Lab: Lab Results  Component Value Date   WBC 4.1 09/07/2012   HGB 9.7* 09/07/2012   HCT 28.4* 09/07/2012   MCV 83.3 09/07/2012   PLT 294 09/07/2012   BMET    Component Value Date/Time   NA 133* 09/07/2012 0540   K 4.3 09/07/2012 0540   CL 97 09/07/2012 0540   CO2 26 09/07/2012 0540   GLUCOSE 92 09/07/2012 0540   BUN 11 09/07/2012 0540   CREATININE 0.81 09/07/2012 0540   CALCIUM 9.4 09/07/2012 0540   GFRNONAA 61* 09/07/2012 0540   GFRAA 70* 09/07/2012 0540     Imaging: no new imaging Scheduled Meds:   . antiseptic oral rinse  15 mL Mouth Rinse q12n4p  . aspirin EC  81 mg Oral Q M,W,F  . B-complex with vitamin C  1 tablet Oral Daily  . chlorhexidine  15 mL Mouth Rinse BID  . feeding supplement  237 mL Oral BID BM  . lisinopril  10 mg Oral Daily   And  . hydrochlorothiazide  12.5 mg Oral Daily  . isosorbide mononitrate  30 mg Oral Daily  . levothyroxine  88 mcg Oral QAC breakfast  . metoprolol tartrate  25 mg Oral BID  . pantoprazole  40 mg Oral Daily  . polyethylene glycol  17 g Oral Daily  . sertraline  25 mg Oral Daily  . sodium chloride  3 mL Intravenous Q12H  . sucralfate  1 g Oral QID   Continuous Infusions:  PRN Meds:.albuterol, alum & mag hydroxide-simeth, guaiFENesin-dextromethorphan, HYDROcodone-acetaminophen, LORazepam, nitroGLYCERIN, ondansetron (ZOFRAN) IV, ondansetron, senna-docusate   Physical Exam: Filed Vitals:   09/08/12 0920  BP: 118/50  Pulse: 63  Temp:   Resp:    Very frail and thin elderly AA woman in no distress Cor- RRR Pulm - unlabored respirations. Neuro - level of consciousness waxes and wanes but no evidence of focal neurologic issues.     Assessment/Plan: 1. Change in mental status - no focal findings or metabolic derangement. Question of  intermittent hypotension  Liberalize BP control - will d/c BB  2. FTT - no change   Illene Regulus [place] [facility] (o) [phone]; (c) [phone] Call-grp - Patsi Sears IM  Tele: [phone]  09/08/2012, 11:14 AM

## 2012-09-09 DIAGNOSIS — R55 Syncope and collapse: Secondary | ICD-10-CM

## 2012-09-09 NOTE — Progress Notes (Signed)
Subjective: Weak, taking small amount of diet. NO specific complaint  Objective: Lab: Lab Results  Component Value Date   WBC 4.1 09/07/2012   HGB 9.7* 09/07/2012   HCT 28.4* 09/07/2012   MCV 83.3 09/07/2012   PLT 294 09/07/2012   BMET    Component Value Date/Time   NA 133* 09/07/2012 0540   K 4.3 09/07/2012 0540   CL 97 09/07/2012 0540   CO2 26 09/07/2012 0540   GLUCOSE 92 09/07/2012 0540   BUN 11 09/07/2012 0540   CREATININE 0.81 09/07/2012 0540   CALCIUM 9.4 09/07/2012 0540   GFRNONAA 61* 09/07/2012 0540   GFRAA 70* 09/07/2012 0540     Imaging: no new imaging  Scheduled Meds:   . antiseptic oral rinse  15 mL Mouth Rinse q12n4p  . aspirin EC  81 mg Oral Q M,W,F  . B-complex with vitamin C  1 tablet Oral Daily  . chlorhexidine  15 mL Mouth Rinse BID  . feeding supplement  237 mL Oral BID BM  . lisinopril  10 mg Oral Daily   And  . hydrochlorothiazide  12.5 mg Oral Daily  . HYDROcodone-acetaminophen  1 tablet Oral QHS  . isosorbide mononitrate  30 mg Oral Daily  . levothyroxine  88 mcg Oral QAC breakfast  . pantoprazole  40 mg Oral Daily  . polyethylene glycol  17 g Oral Daily  . sodium chloride  3 mL Intravenous Q12H  . sucralfate  1 g Oral QID   Continuous Infusions:  PRN Meds:.acetaminophen, albuterol, alum & mag hydroxide-simeth, guaiFENesin-dextromethorphan, nitroGLYCERIN, ondansetron (ZOFRAN) IV, ondansetron, senna-docusate   Physical Exam: Filed Vitals:   09/09/12 0500  BP: 150/71  Pulse: 53  Temp: 98.5 F (36.9 C)  Resp: 18   gen'l - dmaciated, frail AA woman in no distress, propped up in bed to eat Cor- heart sounds distant but regular PUlm - shallow inspirations, no increased WOB, no rales or wheezes Abd - soft Neuro - awake, phonation is very soft but clear.     Assessment/Plan: 1. Mental status - close to baseline Plan - up today with assist,   Orthostatic vitals  2. FTT - continue to encourage calorie intake  dispo - home with  family 12/24   Illene Regulus [place] [facility] (o) [phone]; (c) [phone] Call-grp - Patsi Sears IM  Tele: [phone]  09/09/2012, 8:05 AM

## 2012-09-09 NOTE — Clinical Documentation Improvement (Signed)
CHANGE MENTAL STATUS DOCUMENTATION CLARIFICATION   THIS DOCUMENT IS NOT A PERMANENT PART OF THE MEDICAL RECORD  TO RESPOND TO THE THIS QUERY, FOLLOW THE INSTRUCTIONS BELOW:  1. If needed, update documentation for the patient's encounter via the notes activity.  2. Access this query again and click edit on the In [employer].  3. After updating, or not, click F2 to complete all highlighted (required) fields concerning your review. Select "additional documentation in the medical record" OR "no additional documentation provided".  4. Click Sign note button.  5. The deficiency will fall out of your In Basket *Please let us know if you are not able to complete this workflow by phone or e-mail (listed below).         09/09/12  Dear Dr. Arthur Holms   Marton Redwood  In an effort to better capture your patient's severity of illness/SOI, risk of mortality/ROM, reflect appropriate length of stay and utilization of resources, a review of the patient medical record has revealed the following indicators.   NOTED IN CHART PDX:"CHANGE IN MENTAL STATUS MULTIFACTORAL". THIS WILL CODE TO ALTERED MENTAL STATUS, A SYMPTOM AND LOW WEIGHTED. PLEASE INDICATE IN NOTES/DC SUMMARY POSSIBLE, SUSPECTED, LIKELY UNDERLYING CAUSE OF CHANGE IN MENTAL STATUS.  THANK YOU.  Possible Clinical Conditions?  Encephalopathy (describe type if known) - Hypertensive - Metabolic -Toxic  Acute confusion Acute delirium Acute exacerbation of known dementia (indicate type) New diagnosis of Dementia, Alzheimer's, cerebral atherosclerosis Hyponatremia / Hypernatremia Hypoxemia / Hypoxia  Other Condition (please specify)   Supporting Information: - Risk Factors: Severe PCM, FTT, declining health past few months, last BMI:15.9 08/16/12 - Signs & Symptoms: "Question of intermittent hypotension", increased weakness - Treatment: "liberalize BP control no BB", pureed diet push calories   Reviewed:  no additional documentation  provided  Thank You,  Beverley Fiedler RN BSN Clinical Documentation Specialist: Tele:  [phone] Health Information Management [place]  Notes document my thinking: not sure of etiology - hypotension, advanced age, basically don't know

## 2012-09-09 NOTE — Progress Notes (Signed)
Speech Language Pathology Dysphagia Treatment Patient Details Name: [name] MRN: [id number] DOB: 1917/07/10 Today's Date: 09/09/2012 Time: 4098-1191 SLP Time Calculation (min): 15 min  Assessment / Plan / Recommendation Clinical Impression  Today pt demonstrated sutained attention to PO, ability to masticate soft saltine despite missing dentition. No Overt s/s of aspriation observed. However after minimal PO trials pt with complaining of globus in chest, constantly swallowing, reported feeling that the food might "come back up." Pt observed to have cups of foamy secretions at bedside. Signs indicating possible esophageal dysphagia or GI issue. Pt able to consume Dys 2 (fine chop) diet but recommend continue a puree diet due to possible difficutly transiting solids. Recommend MD f/u for recommendations regarding this issue. Would pt benefit from esophageal assessment?  Is this a change from baseline?      Diet Recommendation  Continue with Current Diet: Dysphagia 1 (puree);Thin liquid    SLP Plan Continue with current plan of care   Pertinent Vitals/Pain NA   Swallowing Goals  SLP Swallowing Goals Patient will consume recommended diet without observed clinical signs of aspiration with: Maximum assistance Swallow Study Goal #1 - Progress: Progressing toward goal Patient will utilize recommended strategies during swallow to increase swallowing safety with: Maximum assistance Swallow Study Goal #2 - Progress: Progressing toward goal  General Temperature Spikes Noted: No Respiratory Status: Room air Behavior/Cognition: Lethargic;Cooperative Oral Cavity - Dentition: Dentures, not available;Edentulous Patient Positioning: Upright in bed  Oral Cavity - Oral Hygiene     Dysphagia Treatment Treatment focused on: Skilled observation of diet tolerance Treatment Methods/Modalities: Skilled observation Patient observed directly with PO's: Yes Type of PO's observed: Dysphagia 2  (chopped);Thin liquids Feeding: Needs assist Liquids provided via: Straw Oral Phase Signs & Symptoms: Prolonged oral phase Pharyngeal Phase Signs & Symptoms: Delayed throat clear;Complaints of globus Type of cueing: Verbal;Tactile;Visual Amount of cueing: Moderate   GO     Zareth Rippetoe, Riley Nearing 09/09/2012, 12:28 PM

## 2012-09-10 ENCOUNTER — Inpatient Hospital Stay (HOSPITAL_COMMUNITY): Payer: Medicare HMO

## 2012-09-10 DIAGNOSIS — K219 Gastro-esophageal reflux disease without esophagitis: Secondary | ICD-10-CM

## 2012-09-10 DIAGNOSIS — I428 Other cardiomyopathies: Secondary | ICD-10-CM

## 2012-09-10 DIAGNOSIS — R1314 Dysphagia, pharyngoesophageal phase: Secondary | ICD-10-CM

## 2012-09-10 DIAGNOSIS — M545 Low back pain: Secondary | ICD-10-CM

## 2012-09-10 MED ORDER — ENSURE COMPLETE PO LIQD: 237.0000 mL | Freq: Two times a day (BID) | ORAL | Status: DC

## 2012-09-10 MED ORDER — ALUM & MAG HYDROXIDE-SIMETH 200-200-20 MG/5ML PO SUSP: 15.0000 mL | Freq: Four times a day (QID) | ORAL | Status: DC | PRN

## 2012-09-10 MED ORDER — ACETAMINOPHEN 325 MG PO TABS: 650.0000 mg | ORAL_TABLET | Freq: Three times a day (TID) | ORAL | Status: DC | PRN

## 2012-09-10 NOTE — Progress Notes (Signed)
Subjective: SLP note reviewed: question of esophageal dysphagia noted. She is awake but I am not sure how much she is understanding. Phonation remains very soft.  Objective: Lab: Lab Results  Component Value Date   WBC 4.1 09/07/2012   HGB 9.7* 09/07/2012   HCT 28.4* 09/07/2012   MCV 83.3 09/07/2012   PLT 294 09/07/2012   BMET    Component Value Date/Time   NA 133* 09/07/2012 0540   K 4.3 09/07/2012 0540   CL 97 09/07/2012 0540   CO2 26 09/07/2012 0540   GLUCOSE 92 09/07/2012 0540   BUN 11 09/07/2012 0540   CREATININE 0.81 09/07/2012 0540   CALCIUM 9.4 09/07/2012 0540   GFRNONAA 61* 09/07/2012 0540   GFRAA 70* 09/07/2012 0540     Imaging: no new imaging  Scheduled Meds:   . antiseptic oral rinse  15 mL Mouth Rinse q12n4p  . aspirin EC  81 mg Oral Q M,W,F  . B-complex with vitamin C  1 tablet Oral Daily  . chlorhexidine  15 mL Mouth Rinse BID  . feeding supplement  237 mL Oral BID BM  . lisinopril  10 mg Oral Daily   And  . hydrochlorothiazide  12.5 mg Oral Daily  . HYDROcodone-acetaminophen  1 tablet Oral QHS  . isosorbide mononitrate  30 mg Oral Daily  . levothyroxine  88 mcg Oral QAC breakfast  . pantoprazole  40 mg Oral Daily  . polyethylene glycol  17 g Oral Daily  . sodium chloride  3 mL Intravenous Q12H  . sucralfate  1 g Oral QID   Continuous Infusions:  PRN Meds:.acetaminophen, albuterol, alum & mag hydroxide-simeth, guaiFENesin-dextromethorphan, nitroGLYCERIN, ondansetron (ZOFRAN) IV, ondansetron, senna-docusate   Physical Exam: Filed Vitals:   09/09/12 2034  BP: 156/76  Pulse: 77  Temp: 98.2 F (36.8 C)  Resp: 18   Very thin, emaciated woman HEENT- holding foamy secretions in her mouth. Can spit with encouragement Cor- RRR PUlm - normal respirations Neuro - awake but degree of understanding is uncertain.     Assessment/Plan: 1. Mental status - at baseline but hard to assess  2. FTT - no change in condition  3. GI - difficulty with  swallow as documented by SLP Plan Ba Swallow with speech pathology  If esophageal stricture or obstruction will call for GI consult   Illene Regulus [place] [facility] (o) [phone]; (c) [phone] Call-grp - Patsi Sears IM  Tele: [phone]  09/10/2012, 6:22 AM

## 2012-09-10 NOTE — Progress Notes (Signed)
Speech Language Pathology  Patient Details Name: [name] MRN: [id number] DOB: [birth year] Today's Date: 09/10/2012 Time:  -      Reviewed MD's note: plans for barium swallow to evaluate for possible stricture or obstruction, however an MBS with Speech Pathology was ordered.  SLP spoke with Dr. Debby Bud who confirmed desired test of barium esophagram (SLP in agreement and recommended esophagram).   Plan:  SLP will follow up once barium swallow completed.  Breck Coons Savage.Ed [employer] [phone]  09/10/2012

## 2012-09-10 NOTE — Progress Notes (Signed)
Pt granddaughter provided with dc instructions and education and verbalizes understanding. Granddaughter aware of all medications and how to take them. No questions or complaints at this time. IV removed with tip intact. Heart monitor cleaned and returned to front. Pt leaving with granddaughter for home. Levonne Spiller, RN

## 2012-09-10 NOTE — Progress Notes (Signed)
Utilization review completed.  

## 2012-09-11 NOTE — Discharge Summary (Signed)
[name], BOWLER NO.:  [id number]  MEDICAL RECORD NO.:  [id number]  LOCATION:  3W28C                        FACILITY:  [facility]  PHYSICIAN:  Rosalyn Gess. Kentley Blyden, MD  DATE OF BIRTH:  July 23, 1917  DATE OF ADMISSION:  09/06/2012 DATE OF DISCHARGE:  09/10/2012                              DISCHARGE SUMMARY   ADMITTING DIAGNOSES: 1. Loss of consciousness/change in consciousness. 2. Hypertension. 3. Failure to thrive. 4. Known cardiomyopathy. 5. History of diverticular bleed.  DISCHARGE DIAGNOSES: 1. Change in level of consciousness, back at baseline. 2. Failure to thrive with poor nutrition secondary to swallow     difficulty. 3. Cardiomyopathy is stable. 4. Lower GI bleed by history, with no active bleeding this admission.  CONSULTANTS:  None.  PROCEDURES: 1. CT scan of the brain at the day of admission, which showed no acute     changes. 2. Abdominal x-rays on the day of admission, which showed no active     problem with a normal bowel gas pattern. 3. Barium swallow, which showed dilated esophagus with poor esophageal     motility and small Zenker's diverticulum a 15 mm at the level of     C6.  No evidence of obstruction.  HISTORY OF PRESENT ILLNESS:  Ms. [name] is a 78 year old woman with progressive inanition, weight loss, weakness.  She was recently admitted for failure to thrive.  The patient has been at home reasonably stable but still with poor p.o. intake despite being on a pureed diet and getting supplements.  The patient also has been treated aggressively for her hypertension.  On the day of admission, the patient was in church and was noted to have what appeared to be an absance type of episode where she was staring off into space and was not aware of what was going on around her.  This lasted for several minutes.  There was no witnessed seizure-like activity.  There was no evidence of significant neurologic change other than lack of attention.   By the time she came to the ER, she was more awake.  Initial evaluation was negative.  Please see the HPI for past medical history, family history, social history, and exam.  Also see recent hospital records.  HOSPITAL COURSE: 1. Change in level of consciousness.  The patient returned to her     baseline relatively quickly.  CT of the head was negative.     Neurologic exam was essentially nonfocal.  Working hypothesis is     that patient had episode of hypotension.  The patient's blood     pressure medications were reduced to avoid overtreatment.  She had     no further episodes of loss of consciousness.  On telemetry, she     did not have any significant pauses or arrhythmias.  Repeat EEG     that was ordered at admission was canceled due to previous study.  The patient was seen by speech pathology.  It was felt she could handle a dysphagia 2 diet, but was continued her on pureed diet.  It was noted that she was having difficulty with swallow complaining of a globus sensation, and having difficulty with frothy secretions that  she was having a great deal of difficulty to clear.  Barium swallow was performed to rule out obstruction, which was negative. The study did seem to indicate esophageal dysmotility with somewhat dilated esophagus. Discussed FLP recommendations with the family which is to continue on a pureed diet with liquid supplements.  She has experiencing inanition due to difficulty with eating.  She is at high risk for choking.  She is also at high risk for continued malnutrition given her poor p.o. intake.  Given the patient's advanced age, she is not a candidate for tube feeding nor did she wish this.  Family was made aware.  They can expect progressive inanition, weight loss, and progressive weakness.  At this time due to these problems, she will be referred back to hospice to resume supportive care.  2. Hypertension.  The patient's blood pressures have been running a      bit on the high side at the time of discharge 188/75.  This is a     known risk given the concern that she was actually having     hypotensive episodes leading to change in consciousness.  Given her     advanced age, her stroke risk is relatively low.  We will continue     some of her antihypertensive medications.  Please see the     medication discharge summary.  With the patient being relatively stable given her comorbidities and advanced age, with no need for further inpatient testing but the patient's desire to return home, at this point she will be discharged to home in a charge of her family.  We will contact hospice for hospice referral to resume hospice care.  The patient will remain on my service as her hospice attending.  DISCHARGE EXAMINATION:  VITAL SIGNS:  Temperature was 97.9, blood pressure 188/75, pulse 76, respirations 16, oxygen saturations 98% on room air. GENERAL APPEARANCE:  This is a very frail, very thin, emaciated African American woman who is in no acute distress.  She is actively working to clear secretions, spitting frothy sputum into a cup. HEENT:  Conjunctiva sclerae were clear.  She has significant arcus senilis, and what appeared to be cataracts obstructing lens.  She has temporal wasting.  She is edentulous.  She has no oral lesions. NECK:  Supple. CHEST:  The patient with no deformities. PULMONARY:  The patient with no increased work of breathing.  She is moving air well.  There is no wheezing. CARDIOVASCULAR:  2+ radial pulse.  Her precordium is quiet.  Heart sounds are present but distant. ABDOMEN:  Soft.  No guarding or rebound. NEURO:  The patient is awake.  She seems to be alert.  She does answer questions.  Phonation is very very soft and hard to hear.  The patient is moving all of her extremities.  No further examination conducted.  FINAL LABORATORY:  From December 21, sodium 133, potassium 4.3, chloride 97, CO2 of 26, BUN 11, creatinine  0.81, glucose was 92.  Troponin was less than 0.3.  WBC was 4.1, hemoglobin 9.7 g which is stable, platelet count 294,000.  Imaging studies as noted.  DISCHARGE MEDICATIONS:  Tylenol p.r.n.; aspirin 81 mg daily Monday, Wednesday, Friday; B complex with vitamin C daily, Mylanta 5 mL p.r.n.; Robitussin DM 5 mL p.r.n. cough; sublingual nitroglycerin as needed; Ensure 3 times a day; Norco 5/325 q.6h p.r.n. pain; Imdur 30 mg once daily; Synthroid 88 mcg daily; lisinopril 10 mg daily; hydrochlorothiazide 12.5 mg daily; Carafate 1 g  a.c. and h.s.  DISPOSITION:  The patient will be discharged home.  She will be referred to hospice.  PROGNOSIS:  Very poor prognosis given her advanced age, comorbidities, continued problem with inanition and inability to take adequate calories with severe calorie protein malnutrition.  The patient is a DNR.  The patient's condition at the time of discharge dictation is guarded.     Rosalyn Gess Teddrick Mallari, MD     MEN/MEDQ  D:  09/10/2012  T:  09/11/2012  Job:  [id number]

## 2012-09-12 ENCOUNTER — Telehealth: Payer: Self-pay | Admitting: Internal Medicine

## 2012-09-12 LAB — PREALBUMIN: Prealbumin: 24.1 mg/dL (ref 18.0–45.0)

## 2012-09-12 NOTE — Telephone Encounter (Signed)
Call from Hospice: 1. Will attend, symptom mgt with hospice docs 2. DME - hospital bed 3. Meds: atropine drops to manage secretions     roxanol 20mg /ml  2-4 mg q 4 for pain 4. Dispo - for [facility] when bed availabl.e  Rx for roxanol faxed to [facility] [address]

## 2012-09-16 ENCOUNTER — Telehealth: Payer: Self-pay | Admitting: *Deleted

## 2012-09-16 NOTE — Telephone Encounter (Signed)
Heather from [facility] called wanting to let Dr Debby Bud know that Ms. Decarolis will be admitted there today.

## 2012-09-20 DIAGNOSIS — E43 Unspecified severe protein-calorie malnutrition: Secondary | ICD-10-CM

## 2012-09-20 DIAGNOSIS — K224 Dyskinesia of esophagus: Secondary | ICD-10-CM

## 2012-09-20 DIAGNOSIS — I428 Other cardiomyopathies: Secondary | ICD-10-CM

## 2012-09-20 DIAGNOSIS — D62 Acute posthemorrhagic anemia: Secondary | ICD-10-CM

## 2012-09-20 DIAGNOSIS — R627 Adult failure to thrive: Secondary | ICD-10-CM

## 2012-09-20 DIAGNOSIS — K922 Gastrointestinal hemorrhage, unspecified: Secondary | ICD-10-CM

## 2012-09-20 DIAGNOSIS — Z515 Encounter for palliative care: Secondary | ICD-10-CM

## 2012-09-25 ENCOUNTER — Telehealth: Payer: Self-pay | Admitting: *Deleted

## 2012-09-25 NOTE — Telephone Encounter (Signed)
Heather from [facility] called to let MD know pt was discharged today. They are having structural problem with the building and pt was d/cd due to safety concerns.

## 2012-09-26 ENCOUNTER — Other Ambulatory Visit: Payer: Self-pay | Admitting: *Deleted

## 2012-09-26 MED ORDER — POLYETHYLENE GLYCOL 3350 17 G PO PACK: 17.0000 g | PACK | Freq: Every day | ORAL | Status: DC | PRN

## 2012-09-26 MED ORDER — BISACODYL 5 MG PO TBEC: 5.0000 mg | DELAYED_RELEASE_TABLET | Freq: Every day | ORAL | Status: DC | PRN

## 2012-09-26 NOTE — Telephone Encounter (Signed)
Rosey Bath, nurse with Hospice called stating pt does not have any dulcolax or miralax. Requesting refills [facility] to [facility]. Done

## 2012-10-01 ENCOUNTER — Telehealth: Payer: Self-pay | Admitting: *Deleted

## 2012-10-01 NOTE — Telephone Encounter (Signed)
Home health nurse, Thereasa Solo, would like to know if they may continue oxygen in the home. [phone]

## 2012-10-02 NOTE — Telephone Encounter (Signed)
Ok for verbal order to continue oxygen therapy in home? Please advise.

## 2012-10-02 NOTE — Telephone Encounter (Signed)
THERESA GRIFFIC, NURSE WITH [facility] NOTIFIED TO CONTINUE OXYGEN THERAPY IN THE HOME PER VO DR. Debby Bud.

## 2012-10-04 ENCOUNTER — Other Ambulatory Visit: Payer: Self-pay | Admitting: Internal Medicine

## 2012-10-25 ENCOUNTER — Telehealth: Payer: Self-pay | Admitting: *Deleted

## 2012-10-25 ENCOUNTER — Other Ambulatory Visit: Payer: Self-pay | Admitting: *Deleted

## 2012-10-25 MED ORDER — PANTOPRAZOLE SODIUM 40 MG PO TBEC: 40.0000 mg | DELAYED_RELEASE_TABLET | Freq: Every day | ORAL | Status: DC

## 2012-10-25 NOTE — Telephone Encounter (Signed)
If she is doing ok I don't need to see her. If not doing OK happy to see her.

## 2012-10-25 NOTE — Telephone Encounter (Signed)
Larrie Kass, hospice nurse [phone]) and told her that Dr Debby Bud stated if she is doing ok, he does not need to see her. But if she is not, he would be happy to see her. She stated she would let her daughter know.

## 2012-10-25 NOTE — Telephone Encounter (Signed)
Rosey Bath, nurse with [facility] called asking if Ms. Luviano needs to come in for a follow up appt. Please advise.

## 2012-11-08 ENCOUNTER — Telehealth: Payer: Self-pay | Admitting: *Deleted

## 2012-11-08 NOTE — Telephone Encounter (Signed)
Afrin nasal spray prn - 1 spr each nostr bid Tylenol prn 500 mg bid  Thx

## 2012-11-08 NOTE — Telephone Encounter (Signed)
Alvy Beal, RN with hospice called stating the pt has a non-productive cough, sneezing and a runny nose. Pt does not have a temperature. Pt has hx of  hypertension. Pt has claritin at home. Please advise if she can use this or another OTC med to treat these sx.

## 2012-11-11 NOTE — Telephone Encounter (Signed)
Pt already completing meds per Dr. Posey Rea. Pt has thick, heavy yellow phlegm mostly in the morning with her productive cough.

## 2012-11-12 NOTE — Telephone Encounter (Signed)
OTC mucinex DM 1200 mg bid

## 2012-11-13 ENCOUNTER — Telehealth: Payer: Self-pay | Admitting: *Deleted

## 2012-11-13 NOTE — Telephone Encounter (Signed)
Unable to reach daughter, will call back.

## 2012-11-13 NOTE — Telephone Encounter (Signed)
Called pt's daughter, Bosie Clos and advised her per Dr Debby Bud to try the OTC of mucinex DM 1200mg  bid. And if there are no changes call us.

## 2012-11-25 ENCOUNTER — Telehealth: Payer: Self-pay | Admitting: Internal Medicine

## 2012-11-25 NOTE — Telephone Encounter (Signed)
Hospice is calling to inform Dr. Debby Bud that the patient has been discharged from Hospice care due to having a diagnosis of greater than 6 months, if you have questions call [phone], patient was discharged 11/22/12

## 2013-02-11 ENCOUNTER — Other Ambulatory Visit: Payer: Self-pay | Admitting: Internal Medicine

## 2013-02-25 ENCOUNTER — Emergency Department (HOSPITAL_COMMUNITY): Payer: Medicare HMO

## 2013-02-25 ENCOUNTER — Encounter (HOSPITAL_COMMUNITY): Payer: Self-pay

## 2013-02-25 ENCOUNTER — Emergency Department (HOSPITAL_COMMUNITY)
Admission: EM | Admit: 2013-02-25 | Discharge: 2013-02-25 | Disposition: A | Discharge status: Home / Self Care | Payer: Medicare HMO | Attending: Emergency Medicine | Admitting: Emergency Medicine

## 2013-02-25 DIAGNOSIS — H409 Unspecified glaucoma: Secondary | ICD-10-CM | POA: Insufficient documentation

## 2013-02-25 DIAGNOSIS — I10 Essential (primary) hypertension: Secondary | ICD-10-CM | POA: Insufficient documentation

## 2013-02-25 DIAGNOSIS — R079 Chest pain, unspecified: Secondary | ICD-10-CM

## 2013-02-25 DIAGNOSIS — Z8619 Personal history of other infectious and parasitic diseases: Secondary | ICD-10-CM | POA: Insufficient documentation

## 2013-02-25 DIAGNOSIS — Z79899 Other long term (current) drug therapy: Secondary | ICD-10-CM | POA: Insufficient documentation

## 2013-02-25 DIAGNOSIS — Z8719 Personal history of other diseases of the digestive system: Secondary | ICD-10-CM | POA: Insufficient documentation

## 2013-02-25 DIAGNOSIS — Z8679 Personal history of other diseases of the circulatory system: Secondary | ICD-10-CM | POA: Insufficient documentation

## 2013-02-25 DIAGNOSIS — K648 Other hemorrhoids: Secondary | ICD-10-CM | POA: Insufficient documentation

## 2013-02-25 DIAGNOSIS — E039 Hypothyroidism, unspecified: Secondary | ICD-10-CM | POA: Insufficient documentation

## 2013-02-25 DIAGNOSIS — R0789 Other chest pain: Secondary | ICD-10-CM | POA: Insufficient documentation

## 2013-02-25 DIAGNOSIS — Z7982 Long term (current) use of aspirin: Secondary | ICD-10-CM | POA: Insufficient documentation

## 2013-02-25 DIAGNOSIS — IMO0002 Reserved for concepts with insufficient information to code with codable children: Secondary | ICD-10-CM | POA: Insufficient documentation

## 2013-02-25 LAB — BASIC METABOLIC PANEL
BUN: 24 mg/dL — ABNORMAL HIGH (ref 6–23)
GFR calc Af Amer: 52 mL/min — ABNORMAL LOW (ref >90)
GFR calc non Af Amer: 45 mL/min — ABNORMAL LOW (ref >90)
Potassium: 3.5 mEq/L (ref 3.5–5.1)
Sodium: 140 mEq/L (ref 135–145)

## 2013-02-25 LAB — HEPATIC FUNCTION PANEL
Albumin: 3.4 g/dL — ABNORMAL LOW (ref 3.5–5.2)
Indirect Bilirubin: 0.3 mg/dL (ref 0.3–0.9)
Total Bilirubin: 0.4 mg/dL (ref 0.3–1.2)

## 2013-02-25 LAB — POCT I-STAT TROPONIN I: Troponin i, poc: 0.01 ng/mL (ref 0.00–0.08)

## 2013-02-25 LAB — CBC
MCHC: 32.7 g/dL (ref 30.0–36.0)
RDW: 16.2 % — ABNORMAL HIGH (ref 11.5–15.5)

## 2013-02-25 MED ORDER — NITROGLYCERIN 0.4 MG SL SUBL: 0.4000 mg | SUBLINGUAL_TABLET | SUBLINGUAL | Status: DC | PRN

## 2013-02-25 NOTE — ED Notes (Signed)
Pt and family state she developed left chest pain at 2230 last night while in bed-daughter gave her 2 NTG with some relief of the pain-pt states some SOB with the pain-O2 applied in triage/12 lead completed

## 2013-02-25 NOTE — ED Provider Notes (Signed)
History     CSN: [id number]  Arrival date & time 02/25/13  0005   First MD Initiated Contact with Patient 02/25/13 0030      Chief Complaint  Patient presents with  . Chest Pain    (Consider location/radiation/quality/duration/timing/severity/associated sxs/prior treatment) Patient is a 79 y.o. female presenting with chest pain. The history is provided by the patient and a relative.  Chest Pain Associated symptoms: no abdominal pain, no cough, no fever and not vomiting   pt w hx dementia, cardiomyopathy, hospice care, presents from home after c/o cp earlier tonight at rest. Family member gave ntg sl. No cp currently. Pt denies sob. No nv, diaphoresis w earlier pain. No cough or uri c/o. No fever or chills. No other recent cp or discomfort. Denies hx cad, mi, stent. Pt v difficult/poor historian - level 5 caveat.   Past Medical History  Diagnosis Date  . Internal hemorrhoid   . Diverticulosis of colon (without mention of hemorrhage)   . Herpes zoster   . Glaucoma   . Lumbar back pain   . Hypertension   . Cardiomyopathy   . Hypothyroid     Past Surgical History  Procedure Laterality Date  . Thyroidectomy    . Appendectomy    . Tubal ligation    . Colonoscopy  07/19/2012    Procedure: COLONOSCOPY;  Surgeon: Meryl Dare, MD,FACG;  Location: [facility];  Service: Endoscopy;  Laterality: N/A;    No family history on file.  History  Substance Use Topics  . Smoking status: Never Smoker   . Smokeless tobacco: Never Used  . Alcohol Use: No    OB History   Grav Para Term Preterm Abortions TAB SAB Ect Mult Living                  Review of Systems  Constitutional: Negative for fever.  Respiratory: Negative for cough.   Cardiovascular: Positive for chest pain.  Gastrointestinal: Negative for vomiting and abdominal pain.  level 5 caveat - dementia, limited historian.   Allergies  Review of patient's allergies indicates no known allergies.  Home Medications    Current Outpatient Rx  Name  Route  Sig  Dispense  Refill  . acetaminophen (TYLENOL) 325 MG tablet   Oral   Take 2 tablets (650 mg total) by mouth every 8 (eight) hours as needed.         Marland Kitchen alum & mag hydroxide-simeth (MAALOX/MYLANTA) 200-200-20 MG/5ML suspension   Oral   Take 15 mLs by mouth every 6 (six) hours as needed for indigestion (dyspepsia).   355 mL      . aspirin EC 81 MG tablet   Oral   Take 81 mg by mouth every Monday, Wednesday, and Friday.         . B Complex-C (B-COMPLEX WITH VITAMIN C) tablet   Oral   Take 1 tablet by mouth daily.         . bisacodyl (DULCOLAX) 5 MG EC tablet   Oral   Take 1 tablet (5 mg total) by mouth daily as needed for constipation.   30 tablet   2   . CARAFATE 1 GM/10ML suspension      TAKE 10 MLS 4 TIMES DAILY 10 MLS BEFORE MEALS AND BEDTIME.   1200 mL   1   . feeding supplement (ENSURE COMPLETE) LIQD   Oral   Take 237 mLs by mouth 2 (two) times daily between meals.         Marland Kitchen  fluticasone (VERAMYST) 27.5 MCG/SPRAY nasal spray   Nasal   Place 2 sprays into the nose daily as needed. For allergies         . isosorbide mononitrate (IMDUR) 30 MG 24 hr tablet   Oral   Take 30 mg by mouth daily.         Marland Kitchen levothyroxine (SYNTHROID, LEVOTHROID) 88 MCG tablet   Oral   Take 88 mcg by mouth daily.         Marland Kitchen levothyroxine (SYNTHROID, LEVOTHROID) 88 MCG tablet      TAKE 1 TABLET BY MOUTH ONCE A DAY   30 tablet   5   . lisinopril-hydrochlorothiazide (PRINZIDE,ZESTORETIC) 10-12.5 MG per tablet   Oral   Take 1 tablet by mouth daily.         . nitroGLYCERIN (NITROSTAT) 0.4 MG SL tablet   Sublingual   Place 0.4 mg under the tongue every 5 (five) minutes as needed. Chest pains         . pantoprazole (PROTONIX) 40 MG tablet   Oral   Take 1 tablet (40 mg total) by mouth daily.   90 tablet   1   . polyethylene glycol (MIRALAX / GLYCOLAX) packet   Oral   Take 17 g by mouth daily as needed. For constipation    14 each   2   . sertraline (ZOLOFT) 25 MG tablet   Oral   Take 25 mg by mouth daily.         . sucralfate (CARAFATE) 1 GM/10ML suspension   Oral   Take 1 g by mouth 4 (four) times daily. Before meals and bedtime           BP 181/92  Pulse 76  Temp(Src) 98.1 F (36.7 C) (Oral)  Resp 20  Ht 5' (1.524 m)  Wt 95 lb (43.092 kg)  BMI 18.55 kg/m2  SpO2 97%  Physical Exam  Nursing note and vitals reviewed. Constitutional: She appears well-developed and well-nourished. No distress.  HENT:  Mouth/Throat: Oropharynx is clear and moist.  Eyes: Conjunctivae are normal. No scleral icterus.  Neck: Neck supple. No tracheal deviation present.  Cardiovascular: Normal rate, regular rhythm, normal heart sounds and intact distal pulses.   Pulmonary/Chest: Effort normal and breath sounds normal. No respiratory distress. She exhibits no tenderness.  Abdominal: Soft. Normal appearance. She exhibits no distension. There is no tenderness.  Musculoskeletal: She exhibits no edema and no tenderness.  Neurological: She is alert.  Skin: Skin is warm and dry. No rash noted.  Psychiatric: She has a normal mood and affect.    ED Course  Procedures (including critical care time)   Results for orders placed during the hospital encounter of 02/25/13  CBC      Result Value Range   WBC 4.8  4.0 - 10.5 K/uL   RBC 3.37 (*) 3.87 - 5.11 MIL/uL   Hemoglobin 9.3 (*) 12.0 - 15.0 g/dL   HCT 16.1 (*) 09.6 - 04.5 %   MCV 84.3  78.0 - 100.0 fL   MCH 27.6  26.0 - 34.0 pg   MCHC 32.7  30.0 - 36.0 g/dL   RDW 40.9 (*) 81.1 - 91.4 %   Platelets 157  150 - 400 K/uL  BASIC METABOLIC PANEL      Result Value Range   Sodium 140  135 - 145 mEq/L   Potassium 3.5  3.5 - 5.1 mEq/L   Chloride 101  96 - 112 mEq/L   CO2 31  19 - 32 mEq/L   Glucose, Bld 104 (*) 70 - 99 mg/dL   BUN 24 (*) 6 - 23 mg/dL   Creatinine, Ser 4.09  0.50 - 1.10 mg/dL   Calcium 9.3  8.4 - 81.1 mg/dL   GFR calc non Af Amer 45 (*) >90 mL/min    GFR calc Af Amer 52 (*) >90 mL/min  PRO B NATRIURETIC PEPTIDE      Result Value Range   Pro B Natriuretic peptide (BNP) 269.7  0 - 450 pg/mL  HEPATIC FUNCTION PANEL      Result Value Range   Total Protein 7.2  6.0 - 8.3 g/dL   Albumin 3.4 (*) 3.5 - 5.2 g/dL   AST 20  0 - 37 U/L   ALT 10  0 - 35 U/L   Alkaline Phosphatase 105  39 - 117 U/L   Total Bilirubin 0.4  0.3 - 1.2 mg/dL   Bilirubin, Direct 0.1  0.0 - 0.3 mg/dL   Indirect Bilirubin 0.3  0.3 - 0.9 mg/dL  POCT I-STAT TROPONIN I      Result Value Range   Troponin i, poc 0.01  0.00 - 0.08 ng/mL   Comment 3            Dg Chest Port 1 View  02/25/2013   *RADIOLOGY REPORT*  Clinical Data: Chest pain.  PORTABLE CHEST - 1 VIEW  Comparison: 09/06/2012  Findings: Stable cardiomegaly.  No edema, infiltrate or pleural fluid is identified.  Visualized bony structures are unremarkable.  IMPRESSION: Stable cardiomegaly.  No active disease.   Original Report Authenticated By: Irish Lack, M.D.       MDM  Labs, ecg. Cxr.  Reviewed nursing notes and prior charts for additional history.    Date: 02/25/2013  Rate: 67  Rhythm: normal sinus rhythm and premature ventricular contractions (PVC)  QRS Axis: normal  Intervals: normal  ST/T Wave abnormalities: nonspecific ST/T changes  Conduction Disutrbances:none  Narrative Interpretation:   Old EKG Reviewed: unchanged  Recheck no cp or discomfort. No sob.  Trop neg.   Pt appears stable for d/c.         Suzi Roots, MD 02/25/13 [phone]

## 2013-03-13 ENCOUNTER — Other Ambulatory Visit: Payer: Self-pay | Admitting: Internal Medicine

## 2013-04-04 ENCOUNTER — Other Ambulatory Visit: Payer: Self-pay | Admitting: Internal Medicine

## 2013-04-27 ENCOUNTER — Other Ambulatory Visit: Payer: Self-pay | Admitting: Internal Medicine

## 2013-04-30 ENCOUNTER — Other Ambulatory Visit: Payer: Self-pay | Admitting: Internal Medicine

## 2013-05-14 ENCOUNTER — Other Ambulatory Visit: Payer: Self-pay | Admitting: Internal Medicine

## 2013-06-02 ENCOUNTER — Encounter: Payer: Self-pay | Admitting: Internal Medicine

## 2013-06-02 ENCOUNTER — Ambulatory Visit (INDEPENDENT_AMBULATORY_CARE_PROVIDER_SITE_OTHER): Payer: Medicaid Other | Admitting: Internal Medicine

## 2013-06-02 VITALS — BP 142/60 | HR 79 | Temp 97.3°F | Wt 93.0 lb

## 2013-06-02 DIAGNOSIS — M25511 Pain in right shoulder: Secondary | ICD-10-CM

## 2013-06-02 DIAGNOSIS — I209 Angina pectoris, unspecified: Secondary | ICD-10-CM

## 2013-06-02 DIAGNOSIS — R627 Adult failure to thrive: Secondary | ICD-10-CM

## 2013-06-02 DIAGNOSIS — I10 Essential (primary) hypertension: Secondary | ICD-10-CM

## 2013-06-02 DIAGNOSIS — D62 Acute posthemorrhagic anemia: Secondary | ICD-10-CM

## 2013-06-02 DIAGNOSIS — K219 Gastro-esophageal reflux disease without esophagitis: Secondary | ICD-10-CM

## 2013-06-02 DIAGNOSIS — H409 Unspecified glaucoma: Secondary | ICD-10-CM

## 2013-06-02 DIAGNOSIS — M25519 Pain in unspecified shoulder: Secondary | ICD-10-CM

## 2013-06-02 DIAGNOSIS — J309 Allergic rhinitis, unspecified: Secondary | ICD-10-CM

## 2013-06-02 NOTE — Patient Instructions (Addendum)
1. Allergy with mucus in your throat - no sign of infection. Plan Ok to use claritin (generic) once a day  Mucinex 600 mg twice a day to help thin the secretions that may pool in the throat  Continue nasal spray.  2. Eyes - it may be the glaucoma - defer treatment plans to the eye doctor.  3. Blood pressure  - looks ok on present medications  4. Acid reflux - sounds like you symptoms are well controlled. Plan Continue  pantoprazole once a day  Continue the carafate - but twice a day should be sufficient - before biggest meal and at bedtime.  5. Weight - doing well - stable over the last six months.  6. Heart - no chest pain by your report so we will continue present medication. Continue aspirin 3 times a week.  7. Anemia - reviewed the chart: the hemoglobin has been pretty steady for the last year. No need for any tests or new treatments.   8. Right shoulder pain - the joint moves well. There is tenderness in the muscle above the shoulder and at the neck. Plan Rub of choice, e.g. Aspercreme, etc.  Massage and heat.  All things considered you are doing well.

## 2013-06-02 NOTE — Progress Notes (Signed)
Subjective:    Patient ID: [name], female    DOB: [birth year], 79 y.o.   MRN: [id number]  HPI [name] for routine medical follow up. She c/o pian in the right shoulder, mostly at night. She is having some rhinorrhea unrelieved by fluticasone spray.She does complain of thick mucus that gathers in her throat. She has a good appetite. Bowels moving on a regular basis. She has had intermittent nausea but no heartburn or dyspepsia. Denies any episodes of hematochezia. She does admit to some sleep problems intermittently.  Past Medical History  Diagnosis Date  . Internal hemorrhoid   . Diverticulosis of colon (without mention of hemorrhage)   . Herpes zoster   . Glaucoma   . Lumbar back pain   . Hypertension   . Cardiomyopathy   . Hypothyroid    Past Surgical History  Procedure Laterality Date  . Thyroidectomy    . Appendectomy    . Tubal ligation    . Colonoscopy  07/19/2012    Procedure: COLONOSCOPY;  Surgeon: Meryl Dare, MD,FACG;  Location: [facility];  Service: Endoscopy;  Laterality: N/A;   History reviewed. No pertinent family history. History   Social History  . Marital Status: Widowed    Spouse Name: N/A    Number of Children: N/A  . Years of Education: N/A   Occupational History  . Not on file.   Social History Main Topics  . Smoking status: Never Smoker   . Smokeless tobacco: Never Used  . Alcohol Use: No  . Drug Use: No  . Sexual Activity: Not Currently   Other Topics Concern  . Not on file   Social History Narrative   Lives alone but family stay with her at night. She remains independent. She has a DNR established but does want medical care.    Current Outpatient Prescriptions on File Prior to Visit  Medication Sig Dispense Refill  . acetaminophen (TYLENOL) 325 MG tablet Take 2 tablets (650 mg total) by mouth every 8 (eight) hours as needed.      Marland Kitchen alum & mag hydroxide-simeth (MAALOX/MYLANTA) 200-200-20 MG/5ML suspension Take 15 mLs by  mouth every 6 (six) hours as needed for indigestion (dyspepsia).  355 mL    . aspirin EC 81 MG tablet Take 81 mg by mouth every Monday, Wednesday, and Friday.      Marland Kitchen CARAFATE 1 GM/10ML suspension TAKE 10 MLS 4 TIMES DAILY 10 MLS BEFORE MEALS AND BEDTIME.  1200 mL  1  . feeding supplement (ENSURE COMPLETE) LIQD Take 237 mLs by mouth 2 (two) times daily between meals.      . fluticasone (VERAMYST) 27.5 MCG/SPRAY nasal spray Place 2 sprays into the nose daily as needed. For allergies      . isosorbide mononitrate (IMDUR) 30 MG 24 hr tablet TAKE 1 TABLET EVERY DAY  30 tablet  11  . levothyroxine (SYNTHROID, LEVOTHROID) 88 MCG tablet TAKE 1 TABLET BY MOUTH ONCE A DAY  30 tablet  5  . lisinopril-hydrochlorothiazide (PRINZIDE,ZESTORETIC) 10-12.5 MG per tablet Take 1 tablet by mouth daily.      . nitroGLYCERIN (NITROSTAT) 0.4 MG SL tablet Place 0.4 mg under the tongue every 5 (five) minutes as needed. Chest pains      . pantoprazole (PROTONIX) 40 MG tablet TAKE 1 TABLET (40 MG TOTAL) BY MOUTH DAILY.  90 tablet  1  . polyethylene glycol (MIRALAX / GLYCOLAX) packet Take 17 g by mouth daily as needed. For constipation  14 each  2  . sertraline (ZOLOFT) 25 MG tablet Take 25 mg by mouth daily.      . [DISCONTINUED] isosorbide dinitrate (ISORDIL) 30 MG tablet Take 30 mg by mouth 3 (three) times daily.        . [DISCONTINUED] ranitidine (ZANTAC) 150 MG tablet Take 150 mg by mouth 2 (two) times daily.         No current facility-administered medications on file prior to visit.      Review of Systems Constitutional:  Negative for fever, chills, activity change and unexpected weight change.  HEENT:  Negative for hearing loss, ear pain, congestion, neck stiffness and postnasal drip. Negative for sore throat or swallowing problems. Negative for dental complaints.   Eyes: Negative for vision loss or change in visual acuity.  Respiratory: Negative for chest tightness and wheezing. Positive for DOE intermittently.    Cardiovascular: Negative for chest pain or palpitations.  Gastrointestinal: No change in bowel habit. No bloating or gas. No reflux or indigestion Genitourinary: Negative for urgency, frequency, flank pain and difficulty urinating.  Musculoskeletal: Negative for myalgias, back pain, arthralgias and gait problem.  Neurological: Negative for dizziness, tremors, weakness and headaches.  Hematological: Negative for adenopathy.  Psychiatric/Behavioral: Negative for behavioral problems and dysphoric mood.       Objective:   Physical Exam Filed Vitals:   06/02/13 1325  BP: 142/60  Pulse: 79  Temp: 97.3 F (36.3 C)   Wt Readings from Last 3 Encounters:  06/02/13 93 lb (42.185 kg)  02/25/13 95 lb (43.092 kg)  09/10/12 84 lb 11.2 oz (38.42 kg)   Gen'l - very elderly, thin AA woman in no distress HEENT - Temporal wasting noted. C&S - muddy, Arcus senilis noted, hazy lens bilaterally. Visual acuity not tested Cor- 2+ radial pulse, RRR Pulm - no increased WOB, Lungs - CTAP Abd - BS+, soft, no guarding Neuro - A&O, very soft spoken, mildly HOH. No tremor. Able to stand w/o assistance pulling up on walker. Ambulates with walker - slowly but w/o assistance. MSK - right shoulder with normal passive ROM, w/o crepitus or click. Tender to palpation at the trapezius, levator muscle groups       Assessment & Plan:  Right shoulder pain - the joint moves well. There is tenderness in the muscle above the shoulder and at the neck. Plan Rub of choice, e.g. Aspercreme, etc.  Massage and heat.

## 2013-06-03 NOTE — Assessment & Plan Note (Signed)
No c/o chest pain or discomfort. Normal cardiac exam.  Plan Continue present medications

## 2013-06-03 NOTE — Assessment & Plan Note (Signed)
Allergy with mucus in your throat - no sign of infection. Plan Ok to use claritin (generic) once a day  Mucinex 600 mg twice a day to help thin the secretions that may pool in the throat  Continue nasal spray.

## 2013-06-03 NOTE — Assessment & Plan Note (Signed)
Visual loss it may be the glaucoma -   Plan defer treatment plans to the eye doctor.

## 2013-06-03 NOTE — Assessment & Plan Note (Signed)
Wt Readings from Last 3 Encounters:  06/02/13 93 lb (42.185 kg)  02/25/13 95 lb (43.092 kg)  09/10/12 84 lb 11.2 oz (38.42 kg)   doing well - stable over the last six months.  Plan Use carnation instant breakfast with ice-cream instead of Ensure.

## 2013-06-03 NOTE — Assessment & Plan Note (Signed)
.  lasthgb3 Lab Results  Component Value Date   HGB 9.3* 02/25/2013   Reviewed chart - Hgb has been stable for past 6+ months. She is asymptomatic - no C/P, no SOB. No report of active blood loss.

## 2013-06-03 NOTE — Assessment & Plan Note (Signed)
BP Readings from Last 3 Encounters:  06/02/13 142/60  02/25/13 145/70  09/10/12 188/75   BMET    Component Value Date/Time   NA 140 02/25/2013 0048   K 3.5 02/25/2013 0048   CL 101 02/25/2013 0048   CO2 31 02/25/2013 0048   GLUCOSE 104* 02/25/2013 0048   BUN 24* 02/25/2013 0048   CREATININE 1.04 02/25/2013 0048   CALCIUM 9.3 02/25/2013 0048   GFRNONAA 45* 02/25/2013 0048   GFRAA 52* 02/25/2013 0048     looks ok on present medications

## 2013-06-03 NOTE — Assessment & Plan Note (Signed)
sounds like you symptoms are well controlled. Plan Continue  pantoprazole once a day  Continue the carafate - but twice a day should be sufficient - before biggest meal and at bedtime.

## 2013-07-01 ENCOUNTER — Other Ambulatory Visit: Payer: Self-pay | Admitting: Internal Medicine

## 2013-07-08 ENCOUNTER — Other Ambulatory Visit: Payer: Self-pay | Admitting: Internal Medicine

## 2013-07-29 ENCOUNTER — Other Ambulatory Visit: Payer: Self-pay | Admitting: Internal Medicine

## 2013-08-01 ENCOUNTER — Emergency Department (HOSPITAL_COMMUNITY)
Admission: EM | Admit: 2013-08-01 | Discharge: 2013-08-02 | Disposition: A | Discharge status: Home / Self Care | Payer: Medicare HMO | Attending: Emergency Medicine | Admitting: Emergency Medicine

## 2013-08-01 ENCOUNTER — Emergency Department (HOSPITAL_COMMUNITY): Payer: Medicare HMO

## 2013-08-01 ENCOUNTER — Encounter (HOSPITAL_COMMUNITY): Payer: Self-pay | Admitting: Emergency Medicine

## 2013-08-01 DIAGNOSIS — E039 Hypothyroidism, unspecified: Secondary | ICD-10-CM | POA: Insufficient documentation

## 2013-08-01 DIAGNOSIS — J441 Chronic obstructive pulmonary disease with (acute) exacerbation: Secondary | ICD-10-CM | POA: Insufficient documentation

## 2013-08-01 DIAGNOSIS — Z8719 Personal history of other diseases of the digestive system: Secondary | ICD-10-CM | POA: Insufficient documentation

## 2013-08-01 DIAGNOSIS — Z7982 Long term (current) use of aspirin: Secondary | ICD-10-CM | POA: Insufficient documentation

## 2013-08-01 DIAGNOSIS — Z8619 Personal history of other infectious and parasitic diseases: Secondary | ICD-10-CM | POA: Insufficient documentation

## 2013-08-01 DIAGNOSIS — Z79899 Other long term (current) drug therapy: Secondary | ICD-10-CM | POA: Insufficient documentation

## 2013-08-01 DIAGNOSIS — I10 Essential (primary) hypertension: Secondary | ICD-10-CM | POA: Insufficient documentation

## 2013-08-01 DIAGNOSIS — IMO0002 Reserved for concepts with insufficient information to code with codable children: Secondary | ICD-10-CM | POA: Insufficient documentation

## 2013-08-01 DIAGNOSIS — Z8669 Personal history of other diseases of the nervous system and sense organs: Secondary | ICD-10-CM | POA: Insufficient documentation

## 2013-08-01 MED ORDER — MORPHINE SULFATE 4 MG/ML IJ SOLN
4.0000 mg | Freq: Once | INTRAMUSCULAR | Status: AC
  Administered 2013-08-02: 4 mg via INTRAVENOUS
  Filled 2013-08-01: qty 1

## 2013-08-01 MED ORDER — IPRATROPIUM BROMIDE 0.02 % IN SOLN
0.5000 mg | Freq: Once | RESPIRATORY_TRACT | Status: AC
  Administered 2013-08-02: 0.5 mg via RESPIRATORY_TRACT
  Filled 2013-08-01: qty 2.5

## 2013-08-01 MED ORDER — ALBUTEROL SULFATE (5 MG/ML) 0.5% IN NEBU
5.0000 mg | INHALATION_SOLUTION | Freq: Once | RESPIRATORY_TRACT | Status: AC
  Administered 2013-08-02: 5 mg via RESPIRATORY_TRACT
  Filled 2013-08-01: qty 1

## 2013-08-01 NOTE — ED Provider Notes (Signed)
CSN: [id number]     Arrival date & time 08/01/13  2212 History   First MD Initiated Contact with Patient 08/01/13 2303     Chief Complaint  Patient presents with  . Shortness of Breath   (Consider location/radiation/quality/duration/timing/severity/associated sxs/prior Treatment) HPI Patient reports developing some shortness of breath with associated right sided reproducible chest pain that's been mild in severity.  All her symptoms began today.  No known history of emphysema.  She does not take breathing treatments at home.  She reports mild cough.  She denies fevers and chills.  She does report that she feels slightly "tired" today.  No nausea vomiting or diarrhea.  No melena or hematochezia.  No urinary complaints.  Abdominal pain.  No radiation of her right-sided chest pain.  She reports the right-sided chest pain is worse with deep breathing and palpation.    Past Medical History  Diagnosis Date  . Internal hemorrhoid   . Diverticulosis of colon (without mention of hemorrhage)   . Herpes zoster   . Glaucoma   . Lumbar back pain   . Hypertension   . Cardiomyopathy   . Hypothyroid    Past Surgical History  Procedure Laterality Date  . Thyroidectomy    . Appendectomy    . Tubal ligation    . Colonoscopy  07/19/2012    Procedure: COLONOSCOPY;  Surgeon: Meryl Dare, MD,FACG;  Location: [facility];  Service: Endoscopy;  Laterality: N/A;   No family history on file. History  Substance Use Topics  . Smoking status: Never Smoker   . Smokeless tobacco: Never Used  . Alcohol Use: No   OB History   Grav Para Term Preterm Abortions TAB SAB Ect Mult Living                 Review of Systems  All other systems reviewed and are negative.    Allergies  Review of patient's allergies indicates no known allergies.  Home Medications   Current Outpatient Rx  Name  Route  Sig  Dispense  Refill  . acetaminophen (TYLENOL) 325 MG tablet   Oral   Take 2 tablets (650 mg total)  by mouth every 8 (eight) hours as needed.         Marland Kitchen aspirin EC 81 MG tablet   Oral   Take 81 mg by mouth every Monday, Wednesday, and Friday.         Marland Kitchen CARAFATE 1 GM/10ML suspension      TAKE 10 MLS 4 TIMES DAILY 10 MLS BEFORE MEALS AND BEDTIME.   1200 mL   1   . feeding supplement (ENSURE COMPLETE) LIQD   Oral   Take 237 mLs by mouth 2 (two) times daily between meals.         . fluticasone (VERAMYST) 27.5 MCG/SPRAY nasal spray   Nasal   Place 2 sprays into the nose daily as needed. For allergies         . guaiFENesin (MUCINEX) 600 MG 12 hr tablet   Oral   Take 600 mg by mouth 2 (two) times daily.         . isosorbide mononitrate (IMDUR) 30 MG 24 hr tablet      TAKE 1 TABLET EVERY DAY   30 tablet   11   . levothyroxine (SYNTHROID, LEVOTHROID) 88 MCG tablet      TAKE 1 TABLET BY MOUTH ONCE A DAY   30 tablet   5   . lisinopril-hydrochlorothiazide (PRINZIDE,ZESTORETIC) 10-12.5  MG per tablet   Oral   Take 1 tablet by mouth daily.         . nitroGLYCERIN (NITROSTAT) 0.4 MG SL tablet   Sublingual   Place 0.4 mg under the tongue every 5 (five) minutes as needed. Chest pains         . pantoprazole (PROTONIX) 40 MG tablet      TAKE 1 TABLET (40 MG TOTAL) BY MOUTH DAILY.   90 tablet   1   . polyethylene glycol (MIRALAX / GLYCOLAX) packet   Oral   Take 17 g by mouth daily as needed. For constipation   14 each   2   . sertraline (ZOLOFT) 25 MG tablet   Oral   Take 25 mg by mouth daily.         Marland Kitchen albuterol (PROVENTIL HFA;VENTOLIN HFA) 108 (90 BASE) MCG/ACT inhaler   Inhalation   Inhale 2 puffs into the lungs every 4 (four) hours as needed for wheezing or shortness of breath.   1 Inhaler   0   . ibuprofen (ADVIL,MOTRIN) 400 MG tablet   Oral   Take 1 tablet (400 mg total) by mouth every 6 (six) hours as needed.   12 tablet   0   . predniSONE (DELTASONE) 10 MG tablet   Oral   Take 6 tablets (60 mg total) by mouth daily.   30 tablet   0     BP 146/77  Pulse 78  Temp(Src) 98.6 F (37 C) (Oral)  Resp 18  SpO2 97% Physical Exam  Nursing note and vitals reviewed. Constitutional: She is oriented to person, place, and time. She appears well-developed and well-nourished. No distress.  HENT:  Head: Normocephalic and atraumatic.  Eyes: EOM are normal.  Neck: Normal range of motion.  Cardiovascular: Normal rate, regular rhythm and normal heart sounds.   Pulmonary/Chest: Effort normal. She has wheezes.  Mild right-sided chest tenderness.  No crepitus.  Abdominal: Soft. She exhibits no distension. There is no tenderness.  Musculoskeletal: Normal range of motion.  Neurological: She is alert and oriented to person, place, and time.  Skin: Skin is warm and dry.  Psychiatric: She has a normal mood and affect. Judgment normal.    ED Course  Procedures (including critical care time) Labs Review Labs Reviewed  CBC WITH DIFFERENTIAL - Abnormal; Notable for the following:    Hemoglobin 11.5 (*)    HCT 35.0 (*)    Platelets 137 (*)    All other components within normal limits  BASIC METABOLIC PANEL - Abnormal; Notable for the following:    Potassium 3.4 (*)    Glucose, Bld 118 (*)    GFR calc non Af Amer 44 (*)    GFR calc Af Amer 51 (*)    All other components within normal limits  D-DIMER, QUANTITATIVE - Abnormal; Notable for the following:    D-Dimer, Quant 1.72 (*)    All other components within normal limits  TROPONIN I   Imaging Review Dg Chest 2 View  08/01/2013   CLINICAL DATA:  SHORTNESS OF BREATH  EXAM: CHEST  2 VIEW  COMPARISON:  DG CHEST 1V PORT dated 02/25/2013  FINDINGS: Stable enlarged cardiac silhouette. Lungs are hyperinflated. The aorta is ectatic. No effusion, infiltrate, or pneumothorax. Osteopenia noted.  IMPRESSION: 1. No acute cardiopulmonary findings. 2. Cardiomegaly. 3. Emphysematous change.   Electronically Signed   By: Genevive Bi M.D.   On: 08/01/2013 23:48   Ct Angio Chest W/cm &/or  Wo  Cm  08/02/2013   CLINICAL DATA:  Short of breath, elevated D-dimer  EXAM: CT ANGIOGRAPHY CHEST WITH CONTRAST  TECHNIQUE: Multidetector CT imaging of the chest was performed using the standard protocol during bolus administration of intravenous contrast. Multiplanar CT image reconstructions including MIPs were obtained to evaluate the vascular anatomy.  CONTRAST:  OMNIPAQUE IOHEXOL 350 MG/ML SOLN  COMPARISON:  Radiograph 08/01/2013  FINDINGS: No filling defects within the pulmonary arteries to suggest acute pulmonary embolism. Abdominal discretion at the ascending aorta is mildly dilated at 37 mm. Aortic intimal calcification present. No acute findings of the aorta evident. No pericardial fluid.  Esophagus is mildly expanded with some low-density material. Esophagus has a tortuous course behind the aorta. No mass lesion identified.  Review of the lung windows demonstrates calcified nodule in the left upper lobe. There is biapical pleural thickening. No focal consolidation. No pneumothorax. No pleural fluid.  Limited view of the upper abdomen demonstrates a benign cyst in the right hepatic lobe. Benign cyst of the left kidney.  Review of the bone windows demonstrate multiple mild compression deformities of the thoracic spine. No acute findings. No evidence of mediastinal hilar lymphadenopathy.  Review of the MIP images confirms the above findings.  IMPRESSION: 1. No evidence of acute pulmonary embolism. 2. No acute pulmonary parenchymal findings. 3. Material within the distal esophagus likely relates to esophageal dysmotility and tortuosity. No focal masses identified.   Electronically Signed   By: Genevive Bi M.D.   On: 08/02/2013 02:07  I personally reviewed the imaging tests through PACS system I reviewed available ER/hospitalization records through the EMR   EKG Interpretation     Ventricular Rate:  67 PR Interval:  184 QRS Duration: 109 QT Interval:  419 QTC Calculation: 442 R  Axis:   -28 Text Interpretation:  Sinus rhythm Borderline left axis deviation Nonspecific repol abnormality, diffuse leads , new since tracing 11 Feb 2002            MDM   1. COPD exacerbation    Patient feels much better after pain medicine and albuterol.  Given the appearance of her chest x-ray I suspect this is a COPD exacerbation.  Am surprised that the patient is ever been on her meds before in the past.  PCP followup.  Home with albuterol, anti-inflammatories, short course of prednisone.  She understands return to the ER for new or worsening symptoms.    Lyanne Co, MD 08/02/13 [phone]

## 2013-08-01 NOTE — ED Notes (Signed)
Patient states that yesterday evening she began to experience shortness of breath at rest and reports feeling tired. Patient and daughter denies fever or cold like symptoms, but just that the patient is not feeling well. Her voice and breathing are course.

## 2013-08-01 NOTE — ED Notes (Signed)
PT TRANSPORTED TO XRAY 

## 2013-08-02 ENCOUNTER — Emergency Department (HOSPITAL_COMMUNITY): Payer: Medicare HMO

## 2013-08-02 LAB — CBC WITH DIFFERENTIAL/PLATELET
Eosinophils Absolute: 0 10*3/uL (ref 0.0–0.7)
Hemoglobin: 11.5 g/dL — ABNORMAL LOW (ref 12.0–15.0)
Lymphocytes Relative: 23 % (ref 12–46)
Lymphs Abs: 0.9 10*3/uL (ref 0.7–4.0)
MCH: 28.5 pg (ref 26.0–34.0)
Monocytes Relative: 10 % (ref 3–12)
Neutro Abs: 2.7 10*3/uL (ref 1.7–7.7)
Neutrophils Relative %: 67 % (ref 43–77)
Platelets: 137 10*3/uL — ABNORMAL LOW (ref 150–400)
RBC: 4.03 MIL/uL (ref 3.87–5.11)
RDW: 15.5 % (ref 11.5–15.5)
WBC: 4.1 10*3/uL (ref 4.0–10.5)

## 2013-08-02 LAB — BASIC METABOLIC PANEL
CO2: 30 mEq/L (ref 19–32)
Chloride: 97 mEq/L (ref 96–112)
GFR calc non Af Amer: 44 mL/min — ABNORMAL LOW (ref >90)
Glucose, Bld: 118 mg/dL — ABNORMAL HIGH (ref 70–99)
Potassium: 3.4 mEq/L — ABNORMAL LOW (ref 3.5–5.1)
Sodium: 137 mEq/L (ref 135–145)

## 2013-08-02 LAB — D-DIMER, QUANTITATIVE: D-Dimer, Quant: 1.72 ug/mL-FEU — ABNORMAL HIGH (ref 0.00–0.48)

## 2013-08-02 MED ORDER — IBUPROFEN 400 MG PO TABS: 400.0000 mg | ORAL_TABLET | Freq: Four times a day (QID) | ORAL | Status: DC | PRN

## 2013-08-02 MED ORDER — ALBUTEROL SULFATE HFA 108 (90 BASE) MCG/ACT IN AERS
2.0000 | INHALATION_SPRAY | RESPIRATORY_TRACT | Status: DC
  Administered 2013-08-02: 2 via RESPIRATORY_TRACT
  Filled 2013-08-02: qty 6.7

## 2013-08-02 MED ORDER — PREDNISONE 20 MG PO TABS
60.0000 mg | ORAL_TABLET | Freq: Once | ORAL | Status: AC
  Administered 2013-08-02: 60 mg via ORAL
  Filled 2013-08-02: qty 3

## 2013-08-02 MED ORDER — PREDNISONE 10 MG PO TABS: 60.0000 mg | ORAL_TABLET | Freq: Every day | ORAL | Status: DC

## 2013-08-02 MED ORDER — IOHEXOL 350 MG/ML SOLN
100.0000 mL | Freq: Once | INTRAVENOUS | Status: AC | PRN
  Administered 2013-08-02: 100 mL via INTRAVENOUS

## 2013-08-02 MED ORDER — ALBUTEROL SULFATE HFA 108 (90 BASE) MCG/ACT IN AERS: 2.0000 | INHALATION_SPRAY | RESPIRATORY_TRACT | Status: DC | PRN

## 2013-08-02 NOTE — ED Notes (Signed)
PT TRANSPORTED TO CT 

## 2013-08-10 ENCOUNTER — Encounter (HOSPITAL_COMMUNITY): Payer: Self-pay | Admitting: Emergency Medicine

## 2013-08-10 ENCOUNTER — Emergency Department (HOSPITAL_COMMUNITY): Payer: Medicare HMO

## 2013-08-10 ENCOUNTER — Inpatient Hospital Stay (HOSPITAL_COMMUNITY)
Admission: EM | Admit: 2013-08-10 | Discharge: 2013-08-13 | DRG: 378 | Disposition: A | Discharge status: Home / Self Care | Payer: Medicare HMO | Attending: Internal Medicine | Admitting: Internal Medicine

## 2013-08-10 DIAGNOSIS — Z791 Long term (current) use of non-steroidal anti-inflammatories (NSAID): Secondary | ICD-10-CM

## 2013-08-10 DIAGNOSIS — N289 Disorder of kidney and ureter, unspecified: Secondary | ICD-10-CM

## 2013-08-10 DIAGNOSIS — K219 Gastro-esophageal reflux disease without esophagitis: Secondary | ICD-10-CM | POA: Diagnosis present

## 2013-08-10 DIAGNOSIS — Z66 Do not resuscitate: Secondary | ICD-10-CM | POA: Diagnosis present

## 2013-08-10 DIAGNOSIS — I10 Essential (primary) hypertension: Secondary | ICD-10-CM | POA: Diagnosis present

## 2013-08-10 DIAGNOSIS — K5730 Diverticulosis of large intestine without perforation or abscess without bleeding: Secondary | ICD-10-CM | POA: Diagnosis present

## 2013-08-10 DIAGNOSIS — K5731 Diverticulosis of large intestine without perforation or abscess with bleeding: Principal | ICD-10-CM | POA: Diagnosis present

## 2013-08-10 DIAGNOSIS — Z681 Body mass index (BMI) 19 or less, adult: Secondary | ICD-10-CM

## 2013-08-10 DIAGNOSIS — F0390 Unspecified dementia without behavioral disturbance: Secondary | ICD-10-CM | POA: Diagnosis present

## 2013-08-10 DIAGNOSIS — K922 Gastrointestinal hemorrhage, unspecified: Secondary | ICD-10-CM | POA: Diagnosis present

## 2013-08-10 DIAGNOSIS — Z79899 Other long term (current) drug therapy: Secondary | ICD-10-CM

## 2013-08-10 DIAGNOSIS — I428 Other cardiomyopathies: Secondary | ICD-10-CM | POA: Diagnosis present

## 2013-08-10 DIAGNOSIS — D649 Anemia, unspecified: Secondary | ICD-10-CM | POA: Diagnosis present

## 2013-08-10 DIAGNOSIS — E039 Hypothyroidism, unspecified: Secondary | ICD-10-CM | POA: Diagnosis present

## 2013-08-10 DIAGNOSIS — K648 Other hemorrhoids: Secondary | ICD-10-CM | POA: Diagnosis present

## 2013-08-10 DIAGNOSIS — N179 Acute kidney failure, unspecified: Secondary | ICD-10-CM | POA: Diagnosis present

## 2013-08-10 DIAGNOSIS — D62 Acute posthemorrhagic anemia: Secondary | ICD-10-CM | POA: Diagnosis present

## 2013-08-10 DIAGNOSIS — Z7982 Long term (current) use of aspirin: Secondary | ICD-10-CM

## 2013-08-10 DIAGNOSIS — H409 Unspecified glaucoma: Secondary | ICD-10-CM | POA: Diagnosis present

## 2013-08-10 LAB — CBC WITH DIFFERENTIAL/PLATELET
Basophils Absolute: 0 10*3/uL (ref 0.0–0.1)
Eosinophils Absolute: 0 10*3/uL (ref 0.0–0.7)
Eosinophils Relative: 1 % (ref 0–5)
Lymphocytes Relative: 38 % (ref 12–46)
MCV: 87.1 fL (ref 78.0–100.0)
Monocytes Relative: 6 % (ref 3–12)
Neutro Abs: 3.7 10*3/uL (ref 1.7–7.7)
Neutrophils Relative %: 56 % (ref 43–77)
Platelets: 208 10*3/uL (ref 150–400)
RDW: 15.6 % — ABNORMAL HIGH (ref 11.5–15.5)
WBC: 6.5 10*3/uL (ref 4.0–10.5)

## 2013-08-10 LAB — POCT I-STAT, CHEM 8
BUN: 38 mg/dL — ABNORMAL HIGH (ref 6–23)
Chloride: 101 mEq/L (ref 96–112)
Creatinine, Ser: 1.3 mg/dL — ABNORMAL HIGH (ref 0.50–1.10)
Glucose, Bld: 109 mg/dL — ABNORMAL HIGH (ref 70–99)
Hemoglobin: 12.9 g/dL (ref 12.0–15.0)
Sodium: 141 mEq/L (ref 135–145)
TCO2: 30 mmol/L (ref 0–100)

## 2013-08-10 LAB — TYPE AND SCREEN
ABO/RH(D): O POS
Antibody Screen: NEGATIVE

## 2013-08-10 LAB — OCCULT BLOOD, POC DEVICE: Fecal Occult Bld: POSITIVE — AB

## 2013-08-10 LAB — POCT I-STAT TROPONIN I

## 2013-08-10 MED ORDER — SODIUM CHLORIDE 0.9 % IJ SOLN: 3.0000 mL | Freq: Two times a day (BID) | INTRAMUSCULAR | Status: DC

## 2013-08-10 MED ORDER — SODIUM CHLORIDE 0.9 % IV BOLUS (SEPSIS)
500.0000 mL | Freq: Once | INTRAVENOUS | Status: AC
  Administered 2013-08-10: 500 mL via INTRAVENOUS

## 2013-08-10 MED ORDER — ASPIRIN EC 81 MG PO TBEC
81.0000 mg | DELAYED_RELEASE_TABLET | ORAL | Status: DC
  Administered 2013-08-11 – 2013-08-13 (×2): 81 mg via ORAL
  Filled 2013-08-10 (×2): qty 1

## 2013-08-10 MED ORDER — SODIUM CHLORIDE 0.9 % IV SOLN
INTRAVENOUS | Status: DC
  Administered 2013-08-10: 19:00:00 via INTRAVENOUS

## 2013-08-10 MED ORDER — LISINOPRIL-HYDROCHLOROTHIAZIDE 10-12.5 MG PO TABS: 1.0000 | ORAL_TABLET | Freq: Every day | ORAL | Status: DC

## 2013-08-10 MED ORDER — ISOSORBIDE MONONITRATE ER 30 MG PO TB24
30.0000 mg | ORAL_TABLET | Freq: Every day | ORAL | Status: DC
  Administered 2013-08-11 – 2013-08-13 (×3): 30 mg via ORAL
  Filled 2013-08-10 (×3): qty 1

## 2013-08-10 MED ORDER — ACETAMINOPHEN 650 MG RE SUPP: 650.0000 mg | Freq: Four times a day (QID) | RECTAL | Status: DC | PRN

## 2013-08-10 MED ORDER — SODIUM CHLORIDE 0.9 % IV SOLN: INTRAVENOUS | Status: AC

## 2013-08-10 MED ORDER — SODIUM CHLORIDE 0.9 % IJ SOLN
3.0000 mL | Freq: Two times a day (BID) | INTRAMUSCULAR | Status: DC
  Administered 2013-08-11 – 2013-08-12 (×3): 3 mL via INTRAVENOUS

## 2013-08-10 MED ORDER — ACETAMINOPHEN 325 MG PO TABS: 650.0000 mg | ORAL_TABLET | Freq: Four times a day (QID) | ORAL | Status: DC | PRN

## 2013-08-10 MED ORDER — ENSURE COMPLETE PO LIQD
237.0000 mL | Freq: Two times a day (BID) | ORAL | Status: DC
  Administered 2013-08-11 – 2013-08-13 (×4): 237 mL via ORAL

## 2013-08-10 MED ORDER — FLUTICASONE FUROATE 27.5 MCG/SPRAY NA SUSP: 2.0000 | Freq: Every day | NASAL | Status: DC

## 2013-08-10 MED ORDER — SUCRALFATE 1 GM/10ML PO SUSP
1.0000 g | Freq: Three times a day (TID) | ORAL | Status: DC
  Administered 2013-08-11 – 2013-08-13 (×9): 1 g via ORAL
  Filled 2013-08-10 (×13): qty 10

## 2013-08-10 MED ORDER — LEVOTHYROXINE SODIUM 88 MCG PO TABS
88.0000 ug | ORAL_TABLET | Freq: Every day | ORAL | Status: DC
  Administered 2013-08-11 – 2013-08-13 (×3): 88 ug via ORAL
  Filled 2013-08-10 (×4): qty 1

## 2013-08-10 MED ORDER — SODIUM CHLORIDE 0.9 % IJ SOLN: 3.0000 mL | INTRAMUSCULAR | Status: DC | PRN

## 2013-08-10 MED ORDER — ONDANSETRON HCL 4 MG/2ML IJ SOLN: 4.0000 mg | Freq: Four times a day (QID) | INTRAMUSCULAR | Status: DC | PRN

## 2013-08-10 MED ORDER — ONDANSETRON HCL 4 MG PO TABS: 4.0000 mg | ORAL_TABLET | Freq: Four times a day (QID) | ORAL | Status: DC | PRN

## 2013-08-10 MED ORDER — NITROGLYCERIN 0.4 MG SL SUBL: 0.4000 mg | SUBLINGUAL_TABLET | SUBLINGUAL | Status: DC | PRN

## 2013-08-10 MED ORDER — PANTOPRAZOLE SODIUM 40 MG PO TBEC
40.0000 mg | DELAYED_RELEASE_TABLET | Freq: Every day | ORAL | Status: DC
Start: 2013-08-11 — End: 2013-08-13
  Administered 2013-08-11 – 2013-08-13 (×3): 40 mg via ORAL
  Filled 2013-08-10 (×3): qty 1

## 2013-08-10 MED ORDER — SERTRALINE HCL 25 MG PO TABS
25.0000 mg | ORAL_TABLET | Freq: Every day | ORAL | Status: DC
  Administered 2013-08-11 – 2013-08-13 (×3): 25 mg via ORAL
  Filled 2013-08-10 (×3): qty 1

## 2013-08-10 MED ORDER — POLYETHYLENE GLYCOL 3350 17 G PO PACK
17.0000 g | PACK | Freq: Every day | ORAL | Status: DC
  Administered 2013-08-12 – 2013-08-13 (×2): 17 g via ORAL
  Filled 2013-08-10 (×3): qty 1

## 2013-08-10 MED ORDER — ALBUTEROL SULFATE HFA 108 (90 BASE) MCG/ACT IN AERS
2.0000 | INHALATION_SPRAY | RESPIRATORY_TRACT | Status: DC | PRN
Start: 2013-08-10 — End: 2013-08-13
  Filled 2013-08-10: qty 6.7

## 2013-08-10 MED ORDER — SODIUM CHLORIDE 0.9 % IV SOLN
80.0000 mg | Freq: Once | INTRAVENOUS | Status: AC
  Administered 2013-08-10: 19:00:00 80 mg via INTRAVENOUS
  Filled 2013-08-10: qty 80

## 2013-08-10 MED ORDER — SODIUM CHLORIDE 0.9 % IV SOLN: 250.0000 mL | INTRAVENOUS | Status: DC | PRN

## 2013-08-10 NOTE — ED Notes (Signed)
Patient's daughter states that the patient had a bowel movement on yesterday 08/09/13 that was very dark and another bowel movement on today that was bloody and formed. Patient's family brought her to the ED for evaluation.

## 2013-08-10 NOTE — H&P (Signed)
[facility] History and Physical  [name] UJW:[id number] DOB: 1917-06-16 DOA: 08/10/2013  Referring physician: Dr Fonnie Jarvis PCP: Illene Regulus, MD   Chief Complaint: Dark stools x 1 day  History obtained from patient's daughter at bedside.  HPI:  79 y/o female with hx of diverticular bleed, internal hemorrhoids, glaucoma, dementia, hypothyroid, cardiomyopathy, hypertension, angina who was brought in to ED by her daughter for dark stools since yesterday morning. Patient at baseline has mild to moderate dementia. She had a dark maroon bowel movement yesterday morning. This was followed by 2 episodes of dark stools mixed with bright red blood this morning.  Patient did not have any dizziness, c/o headache, nausea, vomiting, SOB, abdominal pain, fever, chills, constipation , diarrhea, dysuria, hematuria,  Weakness. patient has moderate dementia and talks minimally. At baseline she ambulates with help of a walker.  Patient was admitted 1 year back with diverticular bleed and had colonoscopy by [facility] which showed polyps and internal hemorrhoids.   In the ED vitals were stable. Rectal exam done by ED physician showed reveals maroon grossly bloody stool.   Hb was 12.9.(better than her baseline around 10).  Creatinine was mildly elevated to 1.30.  [facility] called to admit patient under telemetry.     Review of Systems:  Constitutional: Denies fever, chills, diaphoresis, appetite change and fatigue.  HEENT: Denies congestion, sore throat, rhinorrhea, sneezing, mouth sores, trouble swallowing, neck pain, neck stiffness Respiratory: Denies SOB, DOE, cough, chest tightness,  and wheezing.   Cardiovascular: Denies chest pain, palpitations and leg swelling.  Gastrointestinal: blood in stool, Denies nausea, vomiting, abdominal pain, diarrhea, constipation,  and abdominal distention.  Genitourinary: Denies dysuria, urgency, frequency, hematuria, flank pain and difficulty  urinating.  Musculoskeletal: Denies myalgias, back pain, joint swelling, arthralgias and gait problem.  Skin: Denies pallor, rash and wound.  Neurological: Denies dizziness, seizures, syncope, weakness, light-headedness, numbness and headaches.     Past Medical History  Diagnosis Date  . Internal hemorrhoid   . Diverticulosis of colon (without mention of hemorrhage)   . Herpes zoster   . Glaucoma   . Lumbar back pain   . Hypertension   . Cardiomyopathy   . Hypothyroid    Past Surgical History  Procedure Laterality Date  . Thyroidectomy    . Appendectomy    . Tubal ligation    . Colonoscopy  07/19/2012    Procedure: COLONOSCOPY;  Surgeon: Meryl Dare, MD,FACG;  Location: [facility];  Service: Endoscopy;  Laterality: N/A;   Social History:  reports that she has never smoked. She has never used smokeless tobacco. She reports that she does not drink alcohol or use illicit drugs.  No Known Allergies  History reviewed. No pertinent family history.  Prior to Admission medications   Medication Sig Start Date End Date Taking? Authorizing Provider  albuterol (PROVENTIL HFA;VENTOLIN HFA) 108 (90 BASE) MCG/ACT inhaler Inhale 2 puffs into the lungs every 4 (four) hours as needed for wheezing or shortness of breath. 08/02/13  Yes Lyanne Co, MD  aspirin EC 81 MG tablet Take 81 mg by mouth every Monday, Wednesday, and Friday. 08/29/12  Yes Jacques Navy, MD  CARAFATE 1 GM/10ML suspension TAKE 10 MLS 4 TIMES DAILY 10 MLS BEFORE MEALS AND BEDTIME. 03/13/13  Yes Jacques Navy, MD  feeding supplement, ENSURE COMPLETE, (ENSURE COMPLETE) LIQD Take 237 mLs by mouth 2 (two) times daily between meals. Strawberry. 09/10/12  Yes Jacques Navy, MD  fluticasone (VERAMYST) 27.5 MCG/SPRAY nasal spray  Place 2 sprays into the nose daily as needed. For allergies   Yes Historical Provider, MD  isosorbide mononitrate (IMDUR) 30 MG 24 hr tablet TAKE 1 TABLET EVERY DAY 05/14/13  Yes Jacques Navy, MD  levothyroxine (SYNTHROID, LEVOTHROID) 88 MCG tablet TAKE 1 TABLET BY MOUTH ONCE A DAY 04/04/13  Yes Jacques Navy, MD  lisinopril-hydrochlorothiazide (PRINZIDE,ZESTORETIC) 10-12.5 MG per tablet Take 1 tablet by mouth daily.   Yes Historical Provider, MD  nitroGLYCERIN (NITROSTAT) 0.4 MG SL tablet Place 0.4 mg under the tongue every 5 (five) minutes as needed. Chest pains 02/06/12  Yes Jacques Navy, MD  pantoprazole (PROTONIX) 40 MG tablet TAKE 1 TABLET (40 MG TOTAL) BY MOUTH DAILY. 07/29/13  Yes Jacques Navy, MD  polyethylene glycol (MIRALAX / GLYCOLAX) packet Take 17 g by mouth daily. On Monday Wednesday and Friday.For constipation. 09/26/12  Yes Jacques Navy, MD  sertraline (ZOLOFT) 25 MG tablet Take 25 mg by mouth daily.   Yes Historical Provider, MD    Physical Exam:  Filed Vitals:   08/10/13 1724  BP: 162/96  Pulse: 93  Temp: 98 F (36.7 C)  TempSrc: Oral  Resp: 18  SpO2: 90%    Constitutional: Vital signs reviewed.  Elderly female lying in  bed in no acute distress  HEENT: No pallor, icterus, moist mucosa Chest: Clear to auscultation bilaterally, no added sounds Cardiovascular: RRR, S1 normal, S2 normal, no MRG,  Abdominal: Soft. Non-tender, non-distended, bowel sounds are normal, no masses, organomegaly, or guarding present. Rectal exam per EDP as above EXT: Warm, no edema CNS: AAO x2, nonfocal Labs on Admission:  Basic Metabolic Panel:  Recent Labs Lab 08/10/13 1827  NA 141  K 4.0  CL 101  GLUCOSE 109*  BUN 38*  CREATININE 1.30*   Liver Function Tests: No results found for this basename: AST, ALT, ALKPHOS, BILITOT, PROT, ALBUMIN,  in the last 168 hours No results found for this basename: LIPASE, AMYLASE,  in the last 168 hours No results found for this basename: AMMONIA,  in the last 168 hours CBC:  Recent Labs Lab 08/10/13 1811 08/10/13 1827  WBC 6.5  --   NEUTROABS 3.7  --   HGB 10.9* 12.9  HCT 33.8* 38.0  MCV 87.1  --   PLT  208  --    Cardiac Enzymes: No results found for this basename: CKTOTAL, CKMB, CKMBINDEX, TROPONINI,  in the last 168 hours BNP: No components found with this basename: POCBNP,  CBG: No results found for this basename: GLUCAP,  in the last 168 hours  Radiological Exams on Admission: Dg Chest Port 1 View  08/10/2013   CLINICAL DATA:  GI bleeding  EXAM: PORTABLE CHEST - 1 VIEW  COMPARISON:  08/01/2013  FINDINGS: Mild cardiac enlargement. Aortic arch calcification. Vascular pattern normal. Lungs clear. Mild elevation left diaphragm.  IMPRESSION: No change from prior study.  No acute findings.   Electronically Signed   By: Esperanza Heir M.D.   On: 08/10/2013 18:06     Assessment/Plan  Principal Problem:   Acute lower GI bleeding Likely diverticular versus internal hemorrhoids Monitor serial H&h. Admit to telemetry. Continue Protonix. Hb currently stable and does not need transfusion. if she continues to have GI bleed with drop in h&H she  will need a GI evaluation . Seen by [facility] in past.  Active Problems:  AKI  mild . Likely prerenal. Will hold HCTZ-lisinopril.  Cardiomyopathy Stable. Continue imdur. Hold ASA   GERD  continue protonix and Carafate     HYPOTHYROIDISM continue synthroid    HYPERTENSION Hold HCTZ and lisinopril due to mild AKI.   Diet: regular  DVT prophylaxis SCD  Code Status: DNR Family Communication: Daughter  At bedside Disposition Plan: home once Rosendo Gros [facility] Pager [phone]  Dr Arthur Holms will take over patient care in am. Voice mail left.  If 7PM-7AM, please contact night-coverage [email or web address] Password [facility] 08/10/2013, 8:36 PM    Total time spent:70 minutes

## 2013-08-10 NOTE — ED Provider Notes (Signed)
CSN: [id number]     Arrival date & time 08/10/13  1719 History   First MD Initiated Contact with Patient 08/10/13 1723     Chief Complaint  Patient presents with  . GI Bleeding   (Consider location/radiation/quality/duration/timing/severity/associated sxs/prior Treatment) HPI This 79 year old female has a history of a diverticular bleed in the past requiring a transfusion of 8 units of blood a couple years ago, at baseline she is generally weak and frail lives at home with family and uses a walker for ambulation, she does not talk much at baseline, she was at her baseline until yesterday when she had a very dark almost black stool yesterday, today she had 2 stools that were maroon in color and appeared bloody so the patient was brought to the emergency department, she is no chest pain no shortness breath no abdominal pain no vomiting no change in her mental status no syncope and no treatment prior to arrival. Past Medical History  Diagnosis Date  . Internal hemorrhoid   . Diverticulosis of colon (without mention of hemorrhage)   . Herpes zoster   . Glaucoma   . Lumbar back pain   . Hypertension   . Cardiomyopathy   . Hypothyroid    Past Surgical History  Procedure Laterality Date  . Thyroidectomy    . Appendectomy    . Tubal ligation    . Colonoscopy  07/19/2012    Procedure: COLONOSCOPY;  Surgeon: Meryl Dare, MD,FACG;  Location: [facility];  Service: Endoscopy;  Laterality: N/A;   History reviewed. No pertinent family history. History  Substance Use Topics  . Smoking status: Never Smoker   . Smokeless tobacco: Never Used  . Alcohol Use: No   OB History   Grav Para Term Preterm Abortions TAB SAB Ect Mult Living                 Review of Systems 10 Systems reviewed and are negative for acute change except as noted in the HPI. Allergies  Review of patient's allergies indicates no known allergies.  Home Medications   Current Outpatient Rx  Name  Route  Sig   Dispense  Refill  . albuterol (PROVENTIL HFA;VENTOLIN HFA) 108 (90 BASE) MCG/ACT inhaler   Inhalation   Inhale 2 puffs into the lungs every 4 (four) hours as needed for wheezing or shortness of breath.   1 Inhaler   0   . aspirin EC 81 MG tablet   Oral   Take 81 mg by mouth every Monday, Wednesday, and Friday.         Marland Kitchen CARAFATE 1 GM/10ML suspension      TAKE 10 MLS 4 TIMES DAILY 10 MLS BEFORE MEALS AND BEDTIME.   1200 mL   1   . feeding supplement, ENSURE COMPLETE, (ENSURE COMPLETE) LIQD   Oral   Take 237 mLs by mouth 2 (two) times daily between meals. Strawberry.         . fluticasone (VERAMYST) 27.5 MCG/SPRAY nasal spray   Nasal   Place 2 sprays into the nose daily as needed. For allergies         . isosorbide mononitrate (IMDUR) 30 MG 24 hr tablet      TAKE 1 TABLET EVERY DAY   30 tablet   11   . levothyroxine (SYNTHROID, LEVOTHROID) 88 MCG tablet      TAKE 1 TABLET BY MOUTH ONCE A DAY   30 tablet   5   . lisinopril-hydrochlorothiazide (PRINZIDE,ZESTORETIC) 10-12.5 MG  per tablet   Oral   Take 1 tablet by mouth daily.         . nitroGLYCERIN (NITROSTAT) 0.4 MG SL tablet   Sublingual   Place 0.4 mg under the tongue every 5 (five) minutes as needed. Chest pains         . pantoprazole (PROTONIX) 40 MG tablet      TAKE 1 TABLET (40 MG TOTAL) BY MOUTH DAILY.   90 tablet   1   . polyethylene glycol (MIRALAX / GLYCOLAX) packet   Oral   Take 17 g by mouth daily. On Monday Wednesday and Friday.For constipation.         . sertraline (ZOLOFT) 25 MG tablet   Oral   Take 25 mg by mouth daily.          BP 138/68  Pulse 53  Temp(Src) 97.6 F (36.4 C) (Oral)  Resp 16  Ht 5' (1.524 m)  Wt 90 lb 11.2 oz (41.141 kg)  BMI 17.71 kg/m2  SpO2 100% Physical Exam  Nursing note and vitals reviewed. Constitutional: She is oriented to person, place, and time.  Awake, alert, nontoxic appearance.  HENT:  Head: Atraumatic.  Eyes: Right eye exhibits no  discharge. Left eye exhibits no discharge.  Neck: Neck supple.  Cardiovascular: Normal rate and regular rhythm.   No murmur heard. Pulmonary/Chest: Effort normal and breath sounds normal. No respiratory distress. She has no wheezes. She has no rales. She exhibits no tenderness.  Abdominal: Soft. Bowel sounds are normal. She exhibits no distension and no mass. There is no tenderness. There is no rebound and no guarding.  Genitourinary:  Chaperone present for rectal examination which reveals maroon grossly bloody stool  Musculoskeletal: She exhibits no tenderness.  Baseline ROM, no obvious new focal weakness.  Neurological: She is alert and oriented to person, place, and time.  Mental status and motor strength appears baseline for patient and situation.  Skin: No rash noted.  Psychiatric: She has a normal mood and affect.    ED Course  Procedures (including critical care time) Patient / Family / Caregiver informed of clinical course, understand medical decision-making process, and agree with plan.Pt stable in ED with no significant deterioration in condition. D/w [facility] for admit. 1953 Labs Review Labs Reviewed  CBC WITH DIFFERENTIAL - Abnormal; Notable for the following:    Hemoglobin 10.9 (*)    HCT 33.8 (*)    RDW 15.6 (*)    All other components within normal limits  CBC - Abnormal; Notable for the following:    RBC 3.12 (*)    Hemoglobin 8.9 (*)    HCT 27.2 (*)    RDW 15.7 (*)    All other components within normal limits  HEMOGLOBIN AND HEMATOCRIT, BLOOD - Abnormal; Notable for the following:    Hemoglobin 8.9 (*)    HCT 27.5 (*)    All other components within normal limits  HEMOGLOBIN AND HEMATOCRIT, BLOOD - Abnormal; Notable for the following:    Hemoglobin 9.0 (*)    HCT 27.3 (*)    All other components within normal limits  HEMOGLOBIN AND HEMATOCRIT, BLOOD - Abnormal; Notable for the following:    Hemoglobin 9.6 (*)    HCT 29.6 (*)    All other components within  normal limits  OCCULT BLOOD, POC DEVICE - Abnormal; Notable for the following:    Fecal Occult Bld POSITIVE (*)    All other components within normal limits  POCT I-STAT, CHEM 8 -  Abnormal; Notable for the following:    BUN 38 (*)    Creatinine, Ser 1.30 (*)    Glucose, Bld 109 (*)    All other components within normal limits  POCT I-STAT TROPONIN I  TYPE AND SCREEN   Imaging Review No results found.  EKG Interpretation    Date/Time:  Sunday August 10 2013 17:49:43 EST Ventricular Rate:  69 PR Interval:  174 QRS Duration: 94 QT Interval:  508 QTC Calculation: 544 R Axis:   -27 Text Interpretation:  Sinus rhythm Borderline left axis deviation Nonspecific repol abnormality, lateral leads Prolonged QT interval Baseline wander in lead(s) V6 Compared to previous tracing QT has lengthened Confirmed by Fonnie Jarvis  MD, Jacorion Klem ([id number]) on 08/10/2013 6:09:36 PM            MDM   1. Acute lower GI bleeding   2. Anemia   3. AKI (acute kidney injury)   4. Diverticulosis of colon (without mention of hemorrhage)   5. HYPOTHYROIDISM   6. CARDIOMYOPATHY   7. INTERNAL HEMORRHOIDS   8. DIVERTICULOSIS, COLON   9. GLAUCOMA   10. Renal insufficiency, mild    The patient appears reasonably stabilized for admission considering the current resources, flow, and capabilities available in the ED at this time, and I doubt any other [facility] requiring further screening and/or treatment in the ED prior to admission.    Hurman Horn, MD 08/13/13 [phone]

## 2013-08-11 LAB — CBC
HCT: 27.2 % — ABNORMAL LOW (ref 36.0–46.0)
Hemoglobin: 8.9 g/dL — ABNORMAL LOW (ref 12.0–15.0)
MCV: 87.2 fL (ref 78.0–100.0)
RBC: 3.12 MIL/uL — ABNORMAL LOW (ref 3.87–5.11)
WBC: 6.4 10*3/uL (ref 4.0–10.5)

## 2013-08-11 MED ORDER — FLUTICASONE PROPIONATE 50 MCG/ACT NA SUSP
2.0000 | Freq: Every day | NASAL | Status: DC
  Administered 2013-08-11 – 2013-08-13 (×3): 2 via NASAL
  Filled 2013-08-11: qty 16

## 2013-08-11 MED ORDER — LISINOPRIL 10 MG PO TABS
10.0000 mg | ORAL_TABLET | Freq: Every day | ORAL | Status: DC
  Administered 2013-08-11 – 2013-08-13 (×3): 10 mg via ORAL
  Filled 2013-08-11 (×3): qty 1

## 2013-08-11 MED ORDER — HYDROCHLOROTHIAZIDE 12.5 MG PO CAPS
12.5000 mg | ORAL_CAPSULE | Freq: Every day | ORAL | Status: DC
  Administered 2013-08-12 – 2013-08-13 (×2): 12.5 mg via ORAL
  Filled 2013-08-11 (×3): qty 1

## 2013-08-11 NOTE — Progress Notes (Signed)
Pt is bradycardic in the 40's while asleep

## 2013-08-11 NOTE — Progress Notes (Signed)
The patient's vital signs were: 98.30F,HR 77,RR 16, B/P 167/67. RN telephoned the answering service for the MD on call for Dr. Debby Bud. Awaiting a call back from the MD on call.

## 2013-08-11 NOTE — Progress Notes (Signed)
Subjective: [name] has a h/o diverticular bleeds in the past. She presents for recurrent hematochezia with melanic stools on exam by ED ME. She has had a drop in Hgb from her baseline.   [name] is awake and alert and denies any pain or discomfort.   Objective: Lab:  Recent Labs  08/10/13 1811 08/10/13 1827 08/11/13 0155  WBC 6.5  --  6.4  NEUTROABS 3.7  --   --   HGB 10.9* 12.9 8.9*  HCT 33.8* 38.0 27.2*  MCV 87.1  --  87.2  PLT 208  --  158    Recent Labs  08/10/13 1827  NA 141  K 4.0  CL 101  GLUCOSE 109*  BUN 38*  CREATININE 1.30*    Imaging:  Scheduled Meds: . sodium chloride   Intravenous STAT  . aspirin EC  81 mg Oral Q M,W,F  . feeding supplement (ENSURE COMPLETE)  237 mL Oral BID BM  . fluticasone  2 spray Each Nare Daily  . lisinopril  10 mg Oral Daily   And  . hydrochlorothiazide  12.5 mg Oral Daily  . isosorbide mononitrate  30 mg Oral Daily  . levothyroxine  88 mcg Oral Q0600  . pantoprazole  40 mg Oral Daily  . polyethylene glycol  17 g Oral Daily  . sertraline  25 mg Oral Daily  . sodium chloride  3 mL Intravenous Q12H  . sodium chloride  3 mL Intravenous Q12H  . sucralfate  1 g Oral TID WC & HS   Continuous Infusions: . sodium chloride 125 mL/hr at 08/10/13 1844   PRN Meds:.sodium chloride, acetaminophen, acetaminophen, albuterol, nitroGLYCERIN, ondansetron (ZOFRAN) IV, ondansetron, sodium chloride   Physical Exam: Filed Vitals:   08/11/13 0617  BP: 144/59  Pulse: 51  Temp: 97.7 F (36.5 C)  Resp: 16   Gen'l- very elderly woman in no distress HEENT- Arcus senilis, conjunctiva a little muddy Cor- 2+ radial, RRR PUlm - normal respirations Abd- BS+, soft, no guarding or rebound, no tenderness to palpation Neuro - awake and alert.      Assessment/Plan: 1. GI - patient with LGI bleed most likely diverticular. This has been a problem several times in the past. She is not a candidate for surgical intervention. She appears to  have had a 2 g drop in Hgb: 10.9 to 12.6 to 8.9 with the 12 g reading being suspect.  Plan D/c/ tele  Change Hgb to q 12: 0500, 1700 hrs   Illene Reg[facility] (o) [phone]; (c) [phone] Call-grp - [facility]  Tele: [phone]  08/11/2013, 7:29 AM

## 2013-08-12 ENCOUNTER — Ambulatory Visit: Payer: Medicare HMO | Admitting: Internal Medicine

## 2013-08-12 ENCOUNTER — Telehealth: Payer: Self-pay

## 2013-08-12 DIAGNOSIS — K922 Gastrointestinal hemorrhage, unspecified: Secondary | ICD-10-CM

## 2013-08-12 LAB — HEMOGLOBIN AND HEMATOCRIT, BLOOD
HCT: 27.3 % — ABNORMAL LOW (ref 36.0–46.0)
HCT: 29.6 % — ABNORMAL LOW (ref 36.0–46.0)
Hemoglobin: 9 g/dL — ABNORMAL LOW (ref 12.0–15.0)
Hemoglobin: 9.6 g/dL — ABNORMAL LOW (ref 12.0–15.0)

## 2013-08-12 NOTE — Progress Notes (Signed)
Subjective: Awake and alert. No complaints. Prefers to be discharged in AM  Objective: Lab:  Recent Labs  08/10/13 1811  08/11/13 0155 08/11/13 1650 08/12/13 0513 08/12/13 1722  WBC 6.5  --  6.4  --   --   --   NEUTROABS 3.7  --   --   --   --   --   HGB 10.9*  < > 8.9* 8.9* 9.0* 9.6*  HCT 33.8*  < > 27.2* 27.5* 27.3* 29.6*  MCV 87.1  --  87.2  --   --   --   PLT 208  --  158  --   --   --   < > = values in this interval not displayed.  Recent Labs  08/10/13 1827  NA 141  K 4.0  CL 101  GLUCOSE 109*  BUN 38*  CREATININE 1.30*    Imaging:  Scheduled Meds: . aspirin EC  81 mg Oral Q M,W,F  . feeding supplement (ENSURE COMPLETE)  237 mL Oral BID BM  . fluticasone  2 spray Each Nare Daily  . lisinopril  10 mg Oral Daily   And  . hydrochlorothiazide  12.5 mg Oral Daily  . isosorbide mononitrate  30 mg Oral Daily  . levothyroxine  88 mcg Oral Q0600  . pantoprazole  40 mg Oral Daily  . polyethylene glycol  17 g Oral Daily  . sertraline  25 mg Oral Daily  . sodium chloride  3 mL Intravenous Q12H  . sodium chloride  3 mL Intravenous Q12H  . sucralfate  1 g Oral TID WC & HS   Continuous Infusions:  PRN Meds:.sodium chloride, acetaminophen, acetaminophen, albuterol, nitroGLYCERIN, ondansetron (ZOFRAN) IV, ondansetron, sodium chloride   Physical Exam: Filed Vitals:   08/12/13 1441  BP: 129/62  Pulse: 83  Temp: 97.9 F (36.6 C)  Resp: 18   HEENT- edentulous. Cor -regular Pulm - no increased WOB     Assessment/Plan: 1. GI - seems stable. No BM today. Last was maroon (old blood). Hgb is stable  Plan - d/c home in AM   [employer] [facility] (o) [phone]; (c) [phone] Call-grp - [facility]  Tele: [phone]  08/12/2013, 6:02 PM

## 2013-08-12 NOTE — Telephone Encounter (Signed)
Per Dr Debby Bud patient is in the hospital so appt needs cancelling, thank you.

## 2013-08-12 NOTE — Progress Notes (Signed)
Patient is awake and alert this AM with no complaints. Hgb 9.0 g and has been stable last 3 draws.  Plan H/H at 1700  If Hgb remains stable will plan to d/c home this PM.

## 2013-08-13 DIAGNOSIS — I428 Other cardiomyopathies: Secondary | ICD-10-CM

## 2013-08-13 DIAGNOSIS — D649 Anemia, unspecified: Secondary | ICD-10-CM

## 2013-08-13 DIAGNOSIS — N289 Disorder of kidney and ureter, unspecified: Secondary | ICD-10-CM

## 2013-08-13 NOTE — Discharge Summary (Signed)
[name], [name]              ACCOUNT NO.:  [id number]  MEDICAL RECORD NO.:  [id number]  LOCATION:  1441                         FACILITY:  [facility]  PHYSICIAN:  Rosalyn Gess. Norins, MD  DATE OF BIRTH:  1917/02/14  DATE OF ADMISSION:  08/10/2013 DATE OF DISCHARGE:  08/13/2013                              DISCHARGE SUMMARY   ADMITTING DIAGNOSES:  Hematochezia/lower GI bleed.  DISCHARGE DIAGNOSES:  Hematochezia/lower GI bleed.  CONSULTANTS:  None.  PROCEDURES:  Chest x-ray on the day of admission, which revealed no change from prior study with no acute findings.  HISTORY OF PRESENT ILLNESS:  [name] is a delightful 79 year old woman with a history of severe diverticular bleeds in the past.  She also has a history of internal hemorrhoids, glaucoma, dementia, hypothyroid disease, cardiomyopathy, hypertension, and angina.  She was brought to the emergency department by her daughter for a history of dark stools for the previous 24 hours.  The patient has been at her baseline with mild to moderate dementia.  She did have a dark maroon  bowel movement morning prior to admission.  This was followed by 2 episodes of dark stools with bright red blood on the morning of admission.  The patient denied any dizziness, headache, nausea, vomiting, shortness of breath, abdominal pain, fevers, chills, constipation, dysuria, hematuria.  In the emergency department, the patient's vital signs were stable.  Rectal exam revealed maroon grossly bloody stool.  Hemoglobin was 12.9 g with a baseline of usually 10, making this a possibly species reading. Creatinine was mildly elevated at 1.3.  The patient was subsequently admitted for observation and treatment for potential lower GI bleed.  HOSPITAL COURSE:  [name] was admitted to a med/surg floor.  She was taken off telemetry within the first 24 hours with no cardiac problems. The patient was observed and had serial hemoglobins checked, which were as  follows:  8.9 at admission, 8.9 later the same day, 9.0 on November 25, 9.6 on November 25 in the p.m.  The patient did have some continued maroon stools, but no frank bleeding.  She had no abdominal pain or discomfort.  Her mental status remained stable.  She remained hemodynamically stable.  With no evidence of active GI bleeding with a stable hemoglobin over the previous 48 hours, the patient is now stable and ready for discharge home.  Of note, the patient is very elderly and has multiple medical problems with a guarded prognosis.  The family is aware of this and are willing for her to return home at this time.  DISCHARGE EXAMINATION:  VITAL SIGNS:  Temperature was 97.6, blood pressure 138/68, heart rate 53, respirations 16, oxygen saturations 100%. GENERAL APPEARANCE:  This is a very elderly, small framed woman in no acute distress. HEENT:  Significant for appearance of cataract.  Arcus senilis was noted bilaterally.  Oropharynx without lesions. NECK:  Supple without thyromegaly. CHEST:  The patient has no deformity. LUNGS:  The patient is moving air well with no rales, wheezes, or rhonchi. CARDIOVASCULAR:  2+ peripheral pulse.  A quiet precordium.  Heart sounds were distant, but regular with a soft 1-2/6 systolic murmur. ABDOMEN:  Soft.  She had no  guarding or rebound.  She did have positive bowel sounds. RECTAL:  Deferred. EXTREMITIES:  Thin with no deformity. NEURO:  The patient is awake.  She is alert.  She recognizes this examiner.  She has a difficult time understanding her definite diagnosis, but seems to be at her mental baseline.  FINAL LABORATORY:  Chemistry panel from November 23 with a sodium of 141, potassium 4, chloride 101, BUN of 38, creatinine 1.3, glucose of 109.  Cardiac enzymes were checked this admission and were negative. Hemoglobin readings as noted with an admission white count of 6400, platelet count 158,000.  DISCHARGE MEDICATIONS: 1. Albuterol  metered dose inhaler 2 puffs every 4 hours as needed for     wheezing. 2. Aspirin 81 mg daily. 3. Carafate 1 g as suspension before meals and at bedtime. 4. Feeding supplement using Ensure equivalent with 2 servings between     meals daily. 5. Veramyst 2 sprays to each nostril daily. 6. Imdur 30 mg 1 tablet daily. 7. Levothyroxine 88 mcg daily. 8. Lisinopril/hydrochlorothiazide 10/12.5 mg once daily. 9. Sublingual nitroglycerin 0.4 mg q.5 minutes p.r.n. chest pain. 10.Protonix 40 mg p.o. daily. 11.MiraLAX 17 g by mouth daily. 12.Sertraline 25 mg daily.  DISPOSITION:  The patient is discharged home with family.  CONDITION:  At the time of discharge, stable with no sign of active bleeding, but with a very guarded prognosis given her very advanced age.  FOLLOWUP:  The patient will be seen on an as-needed basis for followup given that she is stable at time of discharge.     Rosalyn Gess Norins, MD     MEN/MEDQ  D:  08/13/2013  T:  08/13/2013  Job:  [id number]

## 2013-08-13 NOTE — Progress Notes (Signed)
INITIAL NUTRITION ASSESSMENT  DOCUMENTATION CODES Per approved criteria  -Not Applicable   INTERVENTION: Continue Ensure Complete po BID, each supplement provides 350 kcal and 13 grams of protein.  NUTRITION DIAGNOSIS: Underweight related to dementia as evidenced by BMI of 17.8.   Goal: Pt to meet >/= 90% of their estimated nutrition needs   Monitor:  Wt, po intake, acceptance of supplements, labs  Reason for Assessment: Low BMI  79 y.o. female  Admitting Dx: Acute lower GI bleeding  ASSESSMENT: 79 y/o female with hx of diverticular bleed, internal hemorrhoids, glaucoma, dementia, hypothyroid, cardiomyopathy, hypertension, angina who was brought in to ED by her daughter for dark stools since yesterday morning. Patient at baseline has mild to moderate dementia.   Pt was unable to tell RD usual body weight. Per wt history in chart, wt has remained stable for the past year. She says that she has "never been a big person." She says that "sometimes her appetite is good." Suspect some form of malnutrition based on Nutrition Focused Physical Exam findings. Unable to be determined. Pt says that she likes Ensure Complete, but that she only drinks it sometimes. Encouraged pt to continue drinking Ensure Complete daily.  Height: Ht Readings from Last 1 Encounters:  08/10/13 5' (1.524 m)    Weight: Wt Readings from Last 1 Encounters:  08/10/13 90 lb 11.2 oz (41.141 kg)    Ideal Body Weight: 45.5 kg  % Ideal Body Weight: 90%  Wt Readings from Last 10 Encounters:  08/10/13 90 lb 11.2 oz (41.141 kg)  06/02/13 93 lb (42.185 kg)  02/25/13 95 lb (43.092 kg)  09/10/12 84 lb 11.2 oz (38.42 kg)  08/21/12 94 lb 1.6 oz (42.683 kg)  08/01/12 80 lb 1.3 oz (36.324 kg)  07/16/12 94 lb (42.638 kg)  07/16/12 94 lb (42.638 kg)  03/13/12 86 lb (39.009 kg)  12/12/11 87 lb (39.463 kg)    Usual Body Weight: unknown, wt appears stable per wt history over the past year  % Usual Body Weight:  unknown  BMI:  Body mass index is 17.71 kg/(m^2).  Estimated Nutritional Needs: Kcal: 1250-1450 Protein: 55-60 g Fluid: >1.5 L  Skin: WNL  Diet Order: Full Liquid  EDUCATION NEEDS: -No education needs identified at this time   Intake/Output Summary (Last 24 hours) at 08/13/13 1037 Last data filed at 08/13/13 0618  Gross per 24 hour  Intake    480 ml  Output   1525 ml  Net  -1045 ml    Last BM: none recorded   Labs:   Recent Labs Lab 08/10/13 1827  NA 141  K 4.0  CL 101  BUN 38*  CREATININE 1.30*  GLUCOSE 109*    CBG (last 3)  No results found for this basename: GLUCAP,  in the last 72 hours  Scheduled Meds: . aspirin EC  81 mg Oral Q M,W,F  . feeding supplement (ENSURE COMPLETE)  237 mL Oral BID BM  . fluticasone  2 spray Each Nare Daily  . lisinopril  10 mg Oral Daily   And  . hydrochlorothiazide  12.5 mg Oral Daily  . isosorbide mononitrate  30 mg Oral Daily  . levothyroxine  88 mcg Oral Q0600  . pantoprazole  40 mg Oral Daily  . polyethylene glycol  17 g Oral Daily  . sertraline  25 mg Oral Daily  . sodium chloride  3 mL Intravenous Q12H  . sodium chloride  3 mL Intravenous Q12H  . sucralfate  1 g  Oral TID WC & HS    Continuous Infusions:   Past Medical History  Diagnosis Date  . Internal hemorrhoid   . Diverticulosis of colon (without mention of hemorrhage)   . Herpes zoster   . Glaucoma   . Lumbar back pain   . Hypertension   . Cardiomyopathy   . Hypothyroid     Past Surgical History  Procedure Laterality Date  . Thyroidectomy    . Appendectomy    . Tubal ligation    . Colonoscopy  07/19/2012    Procedure: COLONOSCOPY;  Surgeon: Meryl Dare, MD,FACG;  Location: [facility];  Service: Endoscopy;  Laterality: N/A;    Ebbie Latus RD, LDN

## 2013-08-13 NOTE — Progress Notes (Signed)
Patient with stable Hgb and no sign of active bleed.  Ready for d/c home  Dictated # [phone] - priority

## 2013-08-19 ENCOUNTER — Ambulatory Visit: Payer: Medicare HMO | Admitting: Internal Medicine

## 2013-08-21 ENCOUNTER — Other Ambulatory Visit (INDEPENDENT_AMBULATORY_CARE_PROVIDER_SITE_OTHER): Payer: Medicare HMO

## 2013-08-21 ENCOUNTER — Ambulatory Visit (INDEPENDENT_AMBULATORY_CARE_PROVIDER_SITE_OTHER): Payer: Medicare HMO | Admitting: Internal Medicine

## 2013-08-21 ENCOUNTER — Encounter: Payer: Self-pay | Admitting: Internal Medicine

## 2013-08-21 VITALS — BP 140/90 | HR 81 | Temp 98.3°F | Wt 90.0 lb

## 2013-08-21 DIAGNOSIS — K922 Gastrointestinal hemorrhage, unspecified: Secondary | ICD-10-CM

## 2013-08-21 DIAGNOSIS — R627 Adult failure to thrive: Secondary | ICD-10-CM

## 2013-08-21 LAB — HEMOGLOBIN AND HEMATOCRIT, BLOOD: Hemoglobin: 9.6 g/dL — ABNORMAL LOW (ref 12.0–15.0)

## 2013-08-21 NOTE — Patient Instructions (Signed)
1. Lower GI bleed - doing well. Please stop at the lab for a blood count  2. Neck pain - a "crick" with stiff muscles. Plan Generic tylenol 500 mg up to every 4 hours (6 per day) max with safety  Heat and massage  3. Sleep - let's try non-RX treatment: camomile tea with warm mild will help her sleep. If this isn't enough ok to take benadryl (generic) elixir 12.5 mg at bedtime  4. Breathing - lungs are clear today. OK to use albuterol for shortness of breath with wheezing.

## 2013-08-21 NOTE — Progress Notes (Signed)
Subjective:    Patient ID: [name], female    DOB: January 14, 1917, 79 y.o.   MRN: [id number]  HPI Post - hospital visit: admitted for hematochezia - small amount of blood. While in-patient she had no recurrence and her Hgb remained very stable.  Since d/c she has done well but she is not sleeping, she has had some wheezing, she has had some right neck pain.  Past Medical History  Diagnosis Date  . Internal hemorrhoid   . Diverticulosis of colon (without mention of hemorrhage)   . Herpes zoster   . Glaucoma   . Lumbar back pain   . Hypertension   . Cardiomyopathy   . Hypothyroid    Past Surgical History  Procedure Laterality Date  . Thyroidectomy    . Appendectomy    . Tubal ligation    . Colonoscopy  07/19/2012    Procedure: COLONOSCOPY;  Surgeon: Meryl Dare, MD,FACG;  Location: [facility];  Service: Endoscopy;  Laterality: N/A;   History reviewed. No pertinent family history. History   Social History  . Marital Status: Widowed    Spouse Name: N/A    Number of Children: N/A  . Years of Education: N/A   Occupational History  . Not on file.   Social History Main Topics  . Smoking status: Never Smoker   . Smokeless tobacco: Never Used  . Alcohol Use: No  . Drug Use: No  . Sexual Activity: Not Currently   Other Topics Concern  . Not on file   Social History Narrative   Lives alone but family stay with her at night. She remains independent. She has a DNR established but does want medical care.    Current Outpatient Prescriptions on File Prior to Visit  Medication Sig Dispense Refill  . albuterol (PROVENTIL HFA;VENTOLIN HFA) 108 (90 BASE) MCG/ACT inhaler Inhale 2 puffs into the lungs every 4 (four) hours as needed for wheezing or shortness of breath.  1 Inhaler  0  . aspirin EC 81 MG tablet Take 81 mg by mouth every Monday, Wednesday, and Friday.      Marland Kitchen CARAFATE 1 GM/10ML suspension TAKE 10 MLS 4 TIMES DAILY 10 MLS BEFORE MEALS AND BEDTIME.  1200 mL  1    . feeding supplement, ENSURE COMPLETE, (ENSURE COMPLETE) LIQD Take 237 mLs by mouth 2 (two) times daily between meals. Strawberry.      . fluticasone (VERAMYST) 27.5 MCG/SPRAY nasal spray Place 2 sprays into the nose daily as needed. For allergies      . isosorbide mononitrate (IMDUR) 30 MG 24 hr tablet TAKE 1 TABLET EVERY DAY  30 tablet  11  . levothyroxine (SYNTHROID, LEVOTHROID) 88 MCG tablet TAKE 1 TABLET BY MOUTH ONCE A DAY  30 tablet  5  . lisinopril-hydrochlorothiazide (PRINZIDE,ZESTORETIC) 10-12.5 MG per tablet Take 1 tablet by mouth daily.      . nitroGLYCERIN (NITROSTAT) 0.4 MG SL tablet Place 0.4 mg under the tongue every 5 (five) minutes as needed. Chest pains      . pantoprazole (PROTONIX) 40 MG tablet TAKE 1 TABLET (40 MG TOTAL) BY MOUTH DAILY.  90 tablet  1  . polyethylene glycol (MIRALAX / GLYCOLAX) packet Take 17 g by mouth daily. On Monday Wednesday and Friday.For constipation.      . sertraline (ZOLOFT) 25 MG tablet Take 25 mg by mouth daily.      . [DISCONTINUED] isosorbide dinitrate (ISORDIL) 30 MG tablet Take 30 mg by mouth 3 (three) times daily.        . [  DISCONTINUED] ranitidine (ZANTAC) 150 MG tablet Take 150 mg by mouth 2 (two) times daily.         No current facility-administered medications on file prior to visit.      Review of Systems Neck pain, side pain, otherwise negative Card, GI, Pul, MSK    Objective:   Physical Exam Filed Vitals:   08/21/13 1445  BP: 140/90  Pulse: 81  Temp: 98.3 F (36.8 C)   Wt Readings from Last 3 Encounters:  08/21/13 90 lb (40.824 kg)  08/10/13 90 lb 11.2 oz (41.141 kg)  06/02/13 93 lb (42.185 kg)   Gen'l - frail elderly woman in no distress HEENT - sunken cheeks, temporal wasting Neck - very tense muscles right neck Cor 2+ radial, RRR with PVC Pulm - CTAP Neuro - awake and alert.        Assessment & Plan:

## 2013-08-21 NOTE — Progress Notes (Signed)
Pre visit review using our clinic review tool, if applicable. No additional management support is needed unless otherwise documented below in the visit note. 

## 2013-08-24 NOTE — Assessment & Plan Note (Signed)
Admission was for limited hematochezia. While in hospital she had no recurrent bleeding. Her Hgb remained stable - 9.6 g at discharge. Since discharge she has not had any recurrence.  Plan F/u H/H  Addendum: Hgb 9.6 g

## 2013-08-24 NOTE — Assessment & Plan Note (Signed)
Patient does eat and family provides supplements. She is actually better now with BMI 17.6 then she was Nov '11 with BMI 16.8  Plan Continue diet of choice  Continue supplements: either ensure or home made.

## 2013-09-13 ENCOUNTER — Other Ambulatory Visit: Payer: Self-pay | Admitting: Internal Medicine

## 2013-10-06 ENCOUNTER — Other Ambulatory Visit: Payer: Self-pay | Admitting: Internal Medicine

## 2013-12-02 ENCOUNTER — Emergency Department (HOSPITAL_COMMUNITY): Payer: Medicare HMO

## 2013-12-02 ENCOUNTER — Observation Stay (HOSPITAL_COMMUNITY)
Admission: EM | Admit: 2013-12-02 | Discharge: 2013-12-03 | Disposition: A | Discharge status: Home / Self Care | Payer: Medicare HMO | Attending: Internal Medicine | Admitting: Internal Medicine

## 2013-12-02 ENCOUNTER — Encounter (HOSPITAL_COMMUNITY): Payer: Self-pay | Admitting: Emergency Medicine

## 2013-12-02 DIAGNOSIS — D649 Anemia, unspecified: Secondary | ICD-10-CM | POA: Insufficient documentation

## 2013-12-02 DIAGNOSIS — F0390 Unspecified dementia without behavioral disturbance: Secondary | ICD-10-CM | POA: Diagnosis not present

## 2013-12-02 DIAGNOSIS — I428 Other cardiomyopathies: Secondary | ICD-10-CM

## 2013-12-02 DIAGNOSIS — R4182 Altered mental status, unspecified: Secondary | ICD-10-CM | POA: Diagnosis not present

## 2013-12-02 DIAGNOSIS — E876 Hypokalemia: Secondary | ICD-10-CM | POA: Insufficient documentation

## 2013-12-02 DIAGNOSIS — R627 Adult failure to thrive: Secondary | ICD-10-CM | POA: Diagnosis not present

## 2013-12-02 DIAGNOSIS — K219 Gastro-esophageal reflux disease without esophagitis: Secondary | ICD-10-CM

## 2013-12-02 DIAGNOSIS — I10 Essential (primary) hypertension: Secondary | ICD-10-CM

## 2013-12-02 DIAGNOSIS — H409 Unspecified glaucoma: Secondary | ICD-10-CM

## 2013-12-02 DIAGNOSIS — Z681 Body mass index (BMI) 19 or less, adult: Secondary | ICD-10-CM | POA: Diagnosis not present

## 2013-12-02 DIAGNOSIS — K5730 Diverticulosis of large intestine without perforation or abscess without bleeding: Secondary | ICD-10-CM

## 2013-12-02 DIAGNOSIS — D62 Acute posthemorrhagic anemia: Secondary | ICD-10-CM

## 2013-12-02 DIAGNOSIS — M545 Low back pain, unspecified: Secondary | ICD-10-CM

## 2013-12-02 DIAGNOSIS — R64 Cachexia: Secondary | ICD-10-CM | POA: Insufficient documentation

## 2013-12-02 DIAGNOSIS — K922 Gastrointestinal hemorrhage, unspecified: Secondary | ICD-10-CM

## 2013-12-02 DIAGNOSIS — E039 Hypothyroidism, unspecified: Secondary | ICD-10-CM | POA: Diagnosis not present

## 2013-12-02 DIAGNOSIS — N289 Disorder of kidney and ureter, unspecified: Secondary | ICD-10-CM

## 2013-12-02 DIAGNOSIS — R410 Disorientation, unspecified: Secondary | ICD-10-CM | POA: Diagnosis present

## 2013-12-02 DIAGNOSIS — Z515 Encounter for palliative care: Secondary | ICD-10-CM

## 2013-12-02 DIAGNOSIS — B029 Zoster without complications: Secondary | ICD-10-CM

## 2013-12-02 DIAGNOSIS — J309 Allergic rhinitis, unspecified: Secondary | ICD-10-CM

## 2013-12-02 DIAGNOSIS — D126 Benign neoplasm of colon, unspecified: Secondary | ICD-10-CM

## 2013-12-02 DIAGNOSIS — N179 Acute kidney failure, unspecified: Secondary | ICD-10-CM

## 2013-12-02 DIAGNOSIS — K648 Other hemorrhoids: Secondary | ICD-10-CM

## 2013-12-02 DIAGNOSIS — R404 Transient alteration of awareness: Secondary | ICD-10-CM | POA: Diagnosis not present

## 2013-12-02 DIAGNOSIS — I209 Angina pectoris, unspecified: Secondary | ICD-10-CM

## 2013-12-02 DIAGNOSIS — Z9089 Acquired absence of other organs: Secondary | ICD-10-CM

## 2013-12-02 HISTORY — DX: Acute myocardial infarction, unspecified: I21.9

## 2013-12-02 HISTORY — DX: Unspecified osteoarthritis, unspecified site: M19.90

## 2013-12-02 HISTORY — DX: Anxiety disorder, unspecified: F41.9

## 2013-12-02 HISTORY — DX: Major depressive disorder, single episode, unspecified: F32.9

## 2013-12-02 HISTORY — DX: Depression, unspecified: F32.A

## 2013-12-02 HISTORY — DX: Gastro-esophageal reflux disease without esophagitis: K21.9

## 2013-12-02 HISTORY — DX: Personal history of other medical treatment: Z92.89

## 2013-12-02 LAB — RAPID URINE DRUG SCREEN, HOSP PERFORMED
AMPHETAMINES: NOT DETECTED
BENZODIAZEPINES: NOT DETECTED
Barbiturates: NOT DETECTED
COCAINE: NOT DETECTED
OPIATES: NOT DETECTED
Tetrahydrocannabinol: NOT DETECTED

## 2013-12-02 LAB — CBC WITH DIFFERENTIAL/PLATELET
BASOS PCT: 0 % (ref 0–1)
Basophils Absolute: 0 10*3/uL (ref 0.0–0.1)
Eosinophils Absolute: 0.1 10*3/uL (ref 0.0–0.7)
Eosinophils Relative: 1 % (ref 0–5)
HCT: 29.4 % — ABNORMAL LOW (ref 36.0–46.0)
HEMOGLOBIN: 9.5 g/dL — AB (ref 12.0–15.0)
LYMPHS ABS: 1.3 10*3/uL (ref 0.7–4.0)
Lymphocytes Relative: 22 % (ref 12–46)
MCH: 27.2 pg (ref 26.0–34.0)
MCHC: 32.3 g/dL (ref 30.0–36.0)
MCV: 84.2 fL (ref 78.0–100.0)
Monocytes Absolute: 0.4 10*3/uL (ref 0.1–1.0)
Monocytes Relative: 7 % (ref 3–12)
NEUTROS PCT: 70 % (ref 43–77)
Neutro Abs: 4.1 10*3/uL (ref 1.7–7.7)
PLATELETS: 173 10*3/uL (ref 150–400)
RBC: 3.49 MIL/uL — AB (ref 3.87–5.11)
RDW: 15.8 % — ABNORMAL HIGH (ref 11.5–15.5)
WBC: 5.8 10*3/uL (ref 4.0–10.5)

## 2013-12-02 LAB — URINALYSIS, ROUTINE W REFLEX MICROSCOPIC
BILIRUBIN URINE: NEGATIVE
GLUCOSE, UA: NEGATIVE mg/dL
Hgb urine dipstick: NEGATIVE
Ketones, ur: NEGATIVE mg/dL
Leukocytes, UA: NEGATIVE
Nitrite: NEGATIVE
PH: 6.5 (ref 5.0–8.0)
Protein, ur: NEGATIVE mg/dL
Specific Gravity, Urine: 1.013 (ref 1.005–1.030)
Urobilinogen, UA: 0.2 mg/dL (ref 0.0–1.0)

## 2013-12-02 LAB — COMPREHENSIVE METABOLIC PANEL
ALBUMIN: 3.6 g/dL (ref 3.5–5.2)
ALT: 10 U/L (ref 0–35)
AST: 21 U/L (ref 0–37)
Alkaline Phosphatase: 96 U/L (ref 39–117)
BILIRUBIN TOTAL: 0.4 mg/dL (ref 0.3–1.2)
BUN: 34 mg/dL — AB (ref 6–23)
CHLORIDE: 102 meq/L (ref 96–112)
CO2: 24 mEq/L (ref 19–32)
Calcium: 9.3 mg/dL (ref 8.4–10.5)
Creatinine, Ser: 1.24 mg/dL — ABNORMAL HIGH (ref 0.50–1.10)
GFR calc Af Amer: 42 mL/min — ABNORMAL LOW (ref >90)
GFR calc non Af Amer: 36 mL/min — ABNORMAL LOW (ref >90)
Glucose, Bld: 110 mg/dL — ABNORMAL HIGH (ref 70–99)
POTASSIUM: 3.6 meq/L — AB (ref 3.7–5.3)
Sodium: 143 mEq/L (ref 137–147)
TOTAL PROTEIN: 7.1 g/dL (ref 6.0–8.3)

## 2013-12-02 LAB — I-STAT VENOUS BLOOD GAS, ED
ACID-BASE EXCESS: 2 mmol/L (ref 0.0–2.0)
Acid-Base Excess: 4 mmol/L — ABNORMAL HIGH (ref 0.0–2.0)
BICARBONATE: 27.6 meq/L — AB (ref 20.0–24.0)
Bicarbonate: 29 mEq/L — ABNORMAL HIGH (ref 20.0–24.0)
O2 SAT: 73 %
O2 Saturation: 53 %
PCO2 VEN: 46.4 mmHg (ref 45.0–50.0)
PCO2 VEN: 46 mmHg (ref 45.0–50.0)
PO2 VEN: 39 mmHg (ref 30.0–45.0)
PO2 VEN: 28 mmHg — AB (ref 30.0–45.0)
Patient temperature: 97.7
TCO2: 29 mmol/L (ref 0–100)
TCO2: 30 mmol/L (ref 0–100)
pH, Ven: 7.381 — ABNORMAL HIGH (ref 7.250–7.300)
pH, Ven: 7.408 — ABNORMAL HIGH (ref 7.250–7.300)

## 2013-12-02 LAB — I-STAT TROPONIN, ED: Troponin i, poc: 0 ng/mL (ref 0.00–0.08)

## 2013-12-02 LAB — I-STAT CHEM 8, ED
BUN: 32 mg/dL — ABNORMAL HIGH (ref 6–23)
Calcium, Ion: 1.18 mmol/L (ref 1.13–1.30)
Chloride: 103 mEq/L (ref 96–112)
Creatinine, Ser: 1.5 mg/dL — ABNORMAL HIGH (ref 0.50–1.10)
Glucose, Bld: 111 mg/dL — ABNORMAL HIGH (ref 70–99)
HEMATOCRIT: 33 % — AB (ref 36.0–46.0)
HEMOGLOBIN: 11.2 g/dL — AB (ref 12.0–15.0)
POTASSIUM: 3.4 meq/L — AB (ref 3.7–5.3)
SODIUM: 144 meq/L (ref 137–147)
TCO2: 26 mmol/L (ref 0–100)

## 2013-12-02 LAB — PRO B NATRIURETIC PEPTIDE: PRO B NATRI PEPTIDE: 205.4 pg/mL (ref 0–450)

## 2013-12-02 LAB — ETHANOL: Alcohol, Ethyl (B): <11 mg/dL (ref 0–11)

## 2013-12-02 LAB — MAGNESIUM: MAGNESIUM: 1.5 mg/dL (ref 1.5–2.5)

## 2013-12-02 LAB — TROPONIN I: Troponin I: <0.3 ng/mL (ref <0.30)

## 2013-12-02 LAB — POC OCCULT BLOOD, ED: Fecal Occult Bld: POSITIVE — AB

## 2013-12-02 LAB — LIPASE, BLOOD: Lipase: 21 U/L (ref 11–59)

## 2013-12-02 LAB — I-STAT CG4 LACTIC ACID, ED: Lactic Acid, Venous: 0.83 mmol/L (ref 0.5–2.2)

## 2013-12-02 MED ORDER — POLYETHYLENE GLYCOL 3350 17 G PO PACK
17.0000 g | PACK | Freq: Every day | ORAL | Status: DC
  Administered 2013-12-03: 17 g via ORAL
  Filled 2013-12-02: qty 1

## 2013-12-02 MED ORDER — SODIUM CHLORIDE 0.9 % IJ SOLN: 3.0000 mL | INTRAMUSCULAR | Status: DC | PRN

## 2013-12-02 MED ORDER — ISOSORBIDE MONONITRATE ER 30 MG PO TB24
30.0000 mg | ORAL_TABLET | Freq: Every day | ORAL | Status: DC
  Administered 2013-12-03: 30 mg via ORAL
  Filled 2013-12-02: qty 1

## 2013-12-02 MED ORDER — LISINOPRIL 10 MG PO TABS
10.0000 mg | ORAL_TABLET | Freq: Every day | ORAL | Status: DC
  Administered 2013-12-03: 10 mg via ORAL
  Filled 2013-12-02: qty 1

## 2013-12-02 MED ORDER — ALBUTEROL SULFATE (2.5 MG/3ML) 0.083% IN NEBU: 2.5000 mg | INHALATION_SOLUTION | RESPIRATORY_TRACT | Status: DC | PRN

## 2013-12-02 MED ORDER — SODIUM CHLORIDE 0.9 % IJ SOLN
3.0000 mL | Freq: Two times a day (BID) | INTRAMUSCULAR | Status: DC
  Administered 2013-12-02 – 2013-12-03 (×2): 3 mL via INTRAVENOUS

## 2013-12-02 MED ORDER — PANTOPRAZOLE SODIUM 40 MG PO TBEC
40.0000 mg | DELAYED_RELEASE_TABLET | Freq: Every day | ORAL | Status: DC
  Administered 2013-12-03: 40 mg via ORAL
  Filled 2013-12-02: qty 1

## 2013-12-02 MED ORDER — SERTRALINE HCL 25 MG PO TABS: 25.0000 mg | ORAL_TABLET | Freq: Every day | ORAL | Status: DC

## 2013-12-02 MED ORDER — SODIUM CHLORIDE 0.9 % IJ SOLN: 3.0000 mL | Freq: Two times a day (BID) | INTRAMUSCULAR | Status: DC

## 2013-12-02 MED ORDER — SUCRALFATE 1 GM/10ML PO SUSP
1.0000 g | Freq: Three times a day (TID) | ORAL | Status: DC
  Administered 2013-12-03 (×2): 1 g via ORAL
  Filled 2013-12-02 (×5): qty 10

## 2013-12-02 MED ORDER — LISINOPRIL-HYDROCHLOROTHIAZIDE 10-12.5 MG PO TABS: 1.0000 | ORAL_TABLET | Freq: Every day | ORAL | Status: DC

## 2013-12-02 MED ORDER — HYDROCHLOROTHIAZIDE 12.5 MG PO CAPS
12.5000 mg | ORAL_CAPSULE | Freq: Every day | ORAL | Status: DC
  Administered 2013-12-03: 12.5 mg via ORAL
  Filled 2013-12-02: qty 1

## 2013-12-02 MED ORDER — HYDRALAZINE HCL 20 MG/ML IJ SOLN
10.0000 mg | Freq: Once | INTRAMUSCULAR | Status: AC
  Administered 2013-12-02: 10 mg via INTRAVENOUS
  Filled 2013-12-02: qty 0.5
  Filled 2013-12-02: qty 1

## 2013-12-02 MED ORDER — SODIUM CHLORIDE 0.9 % IV SOLN: 250.0000 mL | INTRAVENOUS | Status: DC | PRN

## 2013-12-02 MED ORDER — ALBUTEROL SULFATE HFA 108 (90 BASE) MCG/ACT IN AERS: 1.0000 | INHALATION_SPRAY | RESPIRATORY_TRACT | Status: DC | PRN

## 2013-12-02 MED ORDER — LEVOTHYROXINE SODIUM 88 MCG PO TABS
88.0000 ug | ORAL_TABLET | Freq: Every day | ORAL | Status: DC
  Administered 2013-12-03: 88 ug via ORAL
  Filled 2013-12-02 (×2): qty 1

## 2013-12-02 MED ORDER — ENSURE COMPLETE PO LIQD
237.0000 mL | Freq: Two times a day (BID) | ORAL | Status: DC
  Administered 2013-12-03 (×2): 237 mL via ORAL

## 2013-12-02 MED ORDER — ASPIRIN EC 81 MG PO TBEC
81.0000 mg | DELAYED_RELEASE_TABLET | ORAL | Status: DC
  Administered 2013-12-03: 81 mg via ORAL
  Filled 2013-12-02: qty 1

## 2013-12-02 MED ORDER — SERTRALINE HCL 25 MG PO TABS
25.0000 mg | ORAL_TABLET | Freq: Every day | ORAL | Status: DC
  Administered 2013-12-03: 25 mg via ORAL
  Filled 2013-12-02: qty 1

## 2013-12-02 NOTE — ED Provider Notes (Signed)
Level V caveat. Dementia. History is obtained from daughter. Patient became less responsive today per kilogram. She has been eating well. Upon examining the patient she is sleeping arousable verbal stimulus. She complains of pain to her posterior neck and left anterior chest. Patient is chronically ill-appearing. Next supple lungs clear auscultation heart regular rate and rhythm abdomen nontender  Orlie Dakin, MD 12/02/13 1926

## 2013-12-02 NOTE — ED Notes (Signed)
Pt from home, per EMS, family states pt is less alert per normal. Pt normally responsive, talking, only answers yes and no. NSD, VS stable per EMS

## 2013-12-02 NOTE — ED Provider Notes (Signed)
CSN: [id number]     Arrival date & time 12/02/13  46 History   First MD Initiated Contact with Patient 12/02/13 1800     Chief Complaint  Patient presents with  . Altered Mental Status    HPI [name] is a 80 y.o. female who presents with altered mental status. Onset about 1-2 hours prior to arrival.  Her daughters both saw her earlier in the day and report that she was at her baseline.  She was sitting upright, ambulating, and carrying on a normal conversation.  She seemed "happy."  She voiced no complaints earlier in the day.  At about 3 pm, she told one of her daughters that she was very tired and she was going to take a nap.  About 1-2 hours later, when her daughter went to check on her, she found her to be essentially unresponsive.  She was attempting to speak, but very weakly.  She could not make herself understood.  She seemed very confused.  This was a drastic change from earlier in the day, and her daughter called 911 and she was transported here via EMS.  The patient is able to provide very limited history due to altered mental status.  Initially, on my evaluation, she was non-verbal.  She was only able to make incomprehensible sounds.  She was able to follow commands by squeezing my fingers.  She opens her eyes to command.  She is afebrile, hypertensive (150s), HR in the 60s, normal sats.    Shortly thereafter on reevaluation, she was able to tell me that her neck hurts and her upper chest hurts.  However, she is unable to elaborate further.  Her daughters deny that she has complained of headache, visual changes, difficulty with speech, chest pain, shortness of breath, diarrhea, vomiting, dysuria, or any other complaints prior to coming to the ED today.  They report she has not been placed on any new medications or had any medication adjustments.  They deny that she is on any opioids, benzodiazepines, andidepressants, antiemetics, or over the counter medications.    Past Medical  History  Diagnosis Date  . Internal hemorrhoid   . Diverticulosis of colon (without mention of hemorrhage)   . Herpes zoster   . Glaucoma   . Lumbar back pain   . Hypertension   . Cardiomyopathy   . Hypothyroid   . Myocardial infarction   . History of blood transfusion     "related to diverticulitis" (12/02/2013)  . GERD (gastroesophageal reflux disease)   . Arthritis     "joints" (12/02/2013)  . Depression   . Anxiety    Past Surgical History  Procedure Laterality Date  . Thyroidectomy    . Appendectomy    . Tubal ligation    . Colonoscopy  07/19/2012    Procedure: COLONOSCOPY;  Surgeon: Ladene Artist, MD,FACG;  Location: [facility];  Service: Endoscopy;  Laterality: N/A;  . Cataract extraction w/ intraocular lens  implant, bilateral Bilateral   . Glaucoma surgery Right    History reviewed. No pertinent family history. History  Substance Use Topics  . Smoking status: Never Smoker   . Smokeless tobacco: Never Used  . Alcohol Use: No   OB History   Grav Para Term Preterm Abortions TAB SAB Ect Mult Living                 Review of Systems  Unable to perform ROS: Mental status change      Allergies  Review  of patient's allergies indicates no known allergies.  Home Medications   Current Outpatient Rx  Name  Route  Sig  Dispense  Refill  . albuterol (PROVENTIL HFA;VENTOLIN HFA) 108 (90 BASE) MCG/ACT inhaler   Inhalation   Inhale 1-2 puffs into the lungs every 4 (four) hours as needed for wheezing or shortness of breath.         Marland Kitchen aspirin EC 81 MG tablet   Oral   Take 81 mg by mouth every Monday, Wednesday, and Friday.         . feeding supplement, ENSURE COMPLETE, (ENSURE COMPLETE) LIQD   Oral   Take 237 mLs by mouth 2 (two) times daily between meals. Strawberry.         . fluticasone (VERAMYST) 27.5 MCG/SPRAY nasal spray   Nasal   Place 2 sprays into the nose daily as needed. For allergies         . isosorbide mononitrate (IMDUR) 30 MG 24 hr  tablet   Oral   Take 30 mg by mouth daily.         Marland Kitchen levothyroxine (SYNTHROID, LEVOTHROID) 88 MCG tablet   Oral   Take 88 mcg by mouth daily before breakfast.         . nitroGLYCERIN (NITROSTAT) 0.4 MG SL tablet   Sublingual   Place 0.4 mg under the tongue every 5 (five) minutes as needed. Chest pains         . pantoprazole (PROTONIX) 40 MG tablet   Oral   Take 40 mg by mouth daily.         . polyethylene glycol (MIRALAX / GLYCOLAX) packet   Oral   Take 17 g by mouth daily. On Monday Wednesday and Friday.For constipation.         . sertraline (ZOLOFT) 25 MG tablet   Oral   Take 25 mg by mouth daily.         . sucralfate (CARAFATE) 1 GM/10ML suspension   Oral   Take 1 g by mouth 4 (four) times daily -  with meals and at bedtime.         Marland Kitchen lisinopril (PRINIVIL,ZESTRIL) 10 MG tablet   Oral   Take 1 tablet (10 mg total) by mouth daily.   30 tablet   3    BP 116/65  Pulse 82  Temp(Src) 98 F (36.7 C) (Oral)  Resp 18  Ht 5\' 1"  (1.549 m)  Wt 91 lb 11.4 oz (41.6 kg)  BMI 17.34 kg/m2  SpO2 98% Physical Exam  Nursing note and vitals reviewed. Constitutional: She appears lethargic.  Cachectic; chronically ill appearing; lethargic  HENT:  Head: Normocephalic and atraumatic.  Mouth/Throat: No oropharyngeal exudate.  Dry mucus membranes  Eyes: Conjunctivae are normal. Pupils are equal, round, and reactive to light. No scleral icterus.  Neck: Normal range of motion. Neck supple. No JVD present. No thyromegaly present.  Negative Kernig and Brudzinski  Cardiovascular: Normal rate, regular rhythm, normal heart sounds and intact distal pulses.  Exam reveals no gallop and no friction rub.   No murmur heard. Pulmonary/Chest: Effort normal and breath sounds normal. No respiratory distress. She has no wheezes. She has no rales.  Abdominal: Soft. She exhibits no distension and no mass. There is tenderness (epigastric). There is no rebound and no guarding.   Musculoskeletal: She exhibits no edema and no tenderness.  Neurological: She appears lethargic. She is disoriented. No cranial nerve deficit (within limits of patient's ability to cooperate with exam).  She exhibits normal muscle tone. She displays no seizure activity. GCS eye subscore is 3. GCS verbal subscore is 3. GCS motor subscore is 6.  4/5 strength in bilateral upper and lower extremities. Unable to fully assess coordination, gait, speech, visual fields, and sensation due to altered mental status.  Skin: Skin is warm and dry. No lesion noted.  Psychiatric: She exhibits abnormal recent memory.    ED Course  Procedures (including critical care time) Labs Review Labs Reviewed  CBC WITH DIFFERENTIAL - Abnormal; Notable for the following:    RBC 3.49 (*)    Hemoglobin 9.5 (*)    HCT 29.4 (*)    RDW 15.8 (*)    All other components within normal limits  COMPREHENSIVE METABOLIC PANEL - Abnormal; Notable for the following:    Potassium 3.6 (*)    Glucose, Bld 110 (*)    BUN 34 (*)    Creatinine, Ser 1.24 (*)    GFR calc non Af Amer 36 (*)    GFR calc Af Amer 42 (*)    All other components within normal limits  POC OCCULT BLOOD, ED - Abnormal; Notable for the following:    Fecal Occult Bld POSITIVE (*)    All other components within normal limits  I-STAT CHEM 8, ED - Abnormal; Notable for the following:    Potassium 3.4 (*)    BUN 32 (*)    Creatinine, Ser 1.50 (*)    Glucose, Bld 111 (*)    Hemoglobin 11.2 (*)    HCT 33.0 (*)    All other components within normal limits  I-STAT VENOUS BLOOD GAS, ED - Abnormal; Notable for the following:    pH, Ven 7.408 (*)    pO2, Ven 28.0 (*)    Bicarbonate 29.0 (*)    Acid-Base Excess 4.0 (*)    All other components within normal limits  GRAM STAIN  LIPASE, BLOOD  PRO B NATRIURETIC PEPTIDE  MAGNESIUM  ETHANOL  URINE RAPID DRUG SCREEN (HOSP PERFORMED)  TSH  T4, FREE  URINALYSIS, ROUTINE W REFLEX MICROSCOPIC  CBG MONITORING, ED   I-STAT CG4 LACTIC ACID, ED  I-STAT TROPOININ, ED   Imaging Review Ct Head Wo Contrast  12/02/2013   CLINICAL DATA:  Altered level consciousness  EXAM: CT HEAD WITHOUT CONTRAST  TECHNIQUE: Contiguous axial images were obtained from the base of the skull through the vertex without intravenous contrast.  COMPARISON:  CT HEAD W/O CM dated 09/06/2012; CT HEAD W/O CM dated 08/16/2012  FINDINGS: Stable moderate atrophy. No hemorrhage, extra-axial fluid, or infarct. No mass or hydrocephalus. Moderate low attenuation in the deep white matter again identified consistent with chronic involutional change. Calvarium is intact.  IMPRESSION: Moderately severe age-related changes.  No acute findings.   Electronically Signed   By: Skipper Cliche M.D.   On: 12/02/2013 19:15   Dg Chest Port 1 View  12/02/2013   CLINICAL DATA:  Chest pain.  EXAM: PORTABLE CHEST - 1 VIEW  COMPARISON:  DG CHEST 1V PORT dated 08/10/2013  FINDINGS: Mediastinum hilar structures normal. Lungs are clear. Cardiomegaly. Normal pulmonary vascularity. No pleural effusion or pneumothorax.  IMPRESSION: 1. Stable cardiomegaly. 2. No acute cardiopulmonary disease clear   Electronically Signed   By: Marcello Moores  Register   On: 12/02/2013 19:25     EKG Interpretation   Date/Time:  Tuesday December 02 2013 18:28:12 EDT Ventricular Rate:  67 PR Interval:  216 QRS Duration: 104 QT Interval:  479 QTC Calculation: 506 R Axis:   -  21 Text Interpretation:  Age not entered, assumed to be  80 years old for  purpose of ECG interpretation Sinus rhythm Prolonged PR interval  Borderline left axis deviation Prolonged QT interval No significant change  since last tracing Confirmed by Winfred Leeds  MD, SAM [phone]) on 12/02/2013  7:26:28 PM      MDM   80 yo F with dementia and medical comorbidities as detailed presents with AMS.  Found essentially unresponsive by daughters 2 hours PTA.  Drastic change from earlier in the day.  No specific complaints.  No fever or  other signs/symptoms of infection.  No medication changes.  No EKG ischemia; troponin normal.  UA not suggestive of UTI.  CXR with no pneumonia.  Head CT with no acute findings.  She has mild AKI.  Remainder of labs unrevealing; normal lactate, electrolytes, blood gas, and thyroid tests.  Negative ETOH and drug screen.  Normal glucose.  Normal lipase.  Undifferentiated delirium.  She was given IVF bolus and mIVF in the ED.  Exam somewhat improved.  More alert, but still disoriented and not at baseline per daughters.  Will admit to hospitalist for further workup.  Final diagnoses:  Dementia  Altered mental state  Delirium  Renal insufficiency, mild  HYPOTHYROIDISM  Adult failure to thrive  AKI (acute kidney injury)     Wendall Papa, MD 12/04/13 1348

## 2013-12-02 NOTE — Progress Notes (Signed)
Pt arrived to unit alert and oriented x3. Upon initial assessment, pt has a stage 1 pressure ulcer to sacrum. Oriented to room, unit, and staff.  Bed in lowest position and call bell is within reach. Will continue to monitor.

## 2013-12-02 NOTE — Progress Notes (Signed)
Called to get report on patient, nurse stated she would call back after assisting another patient.

## 2013-12-02 NOTE — ED Notes (Signed)
Myrtie Neither, RN states that the pt is more alert now then when she arrived.

## 2013-12-02 NOTE — H&P (Signed)
PCP:   Adella Hare, MD   Chief Complaint:  Acting funny  HPI: 80 yo female h/o mild dementia lives with her dtr still ambulatory with a walker comes in with altered mental status.  Daughter says that she went to take a nap around 3pm.  When she awoke she was not talking normally and appeared confused.  911 was called, within a couple hours she was back to normal.  There has been no n/v/d.  No sob, no cough.  No fevers.  No swelling.  No complaints of dyrsuria, no recent illnesses.  No trouble walking.  No rashes.  Daughter has not noticed any focal neuro deficits except for the confusion.  Pt denies any complaints at this time.  Review of Systems:  Positive and negative as per HPI otherwise all other systems are negative per daughter  Past Medical History: Past Medical History  Diagnosis Date  . Internal hemorrhoid   . Diverticulosis of colon (without mention of hemorrhage)   . Herpes zoster   . Glaucoma   . Lumbar back pain   . Hypertension   . Cardiomyopathy   . Hypothyroid    Past Surgical History  Procedure Laterality Date  . Thyroidectomy    . Appendectomy    . Tubal ligation    . Colonoscopy  07/19/2012    Procedure: COLONOSCOPY;  Surgeon: Ladene Artist, MD,FACG;  Location: [facility];  Service: Endoscopy;  Laterality: N/A;    Medications: Prior to Admission medications   Medication Sig Start Date End Date Taking? Authorizing Provider  albuterol (PROVENTIL HFA;VENTOLIN HFA) 108 (90 BASE) MCG/ACT inhaler Inhale 1-2 puffs into the lungs every 4 (four) hours as needed for wheezing or shortness of breath.   Yes Historical Provider, MD  aspirin EC 81 MG tablet Take 81 mg by mouth every Monday, Wednesday, and Friday. 08/29/12  Yes Neena Rhymes, MD  feeding supplement, ENSURE COMPLETE, (ENSURE COMPLETE) LIQD Take 237 mLs by mouth 2 (two) times daily between meals. Strawberry. 09/10/12  Yes Neena Rhymes, MD  fluticasone (VERAMYST) 27.5 MCG/SPRAY nasal spray Place  2 sprays into the nose daily as needed. For allergies   Yes Historical Provider, MD  isosorbide mononitrate (IMDUR) 30 MG 24 hr tablet Take 30 mg by mouth daily.   Yes Historical Provider, MD  levothyroxine (SYNTHROID, LEVOTHROID) 88 MCG tablet Take 88 mcg by mouth daily before breakfast.   Yes Historical Provider, MD  lisinopril-hydrochlorothiazide (PRINZIDE,ZESTORETIC) 10-12.5 MG per tablet Take 1 tablet by mouth daily.   Yes Historical Provider, MD  nitroGLYCERIN (NITROSTAT) 0.4 MG SL tablet Place 0.4 mg under the tongue every 5 (five) minutes as needed. Chest pains 02/06/12  Yes Neena Rhymes, MD  pantoprazole (PROTONIX) 40 MG tablet Take 40 mg by mouth daily.   Yes Historical Provider, MD  polyethylene glycol (MIRALAX / GLYCOLAX) packet Take 17 g by mouth daily. On Monday Wednesday and Friday.For constipation. 09/26/12  Yes Neena Rhymes, MD  sertraline (ZOLOFT) 25 MG tablet Take 25 mg by mouth daily.   Yes Historical Provider, MD  sucralfate (CARAFATE) 1 GM/10ML suspension Take 1 g by mouth 4 (four) times daily -  with meals and at bedtime.   Yes Historical Provider, MD    Allergies:  No Known Allergies  Social History:  reports that she has never smoked. She has never used smokeless tobacco. She reports that she does not drink alcohol or use illicit drugs.  Family History: History reviewed. No pertinent family history.  Physical Exam: Filed Vitals:   12/02/13 1918 12/02/13 2000 12/02/13 2100 12/02/13 2104  BP: 168/55 159/62 150/61 150/61  Pulse:  61 62 55  Temp:      TempSrc:      Resp: 20 22 20 18   SpO2: 99% 99% 100% 99%   General appearance: alert, cooperative and no distress Head: Normocephalic, without obvious abnormality, atraumatic Eyes: negative Nose: Nares normal. Septum midline. Mucosa normal. No drainage or sinus tenderness. Neck: no JVD and supple, symmetrical, trachea midline Lungs: clear to auscultation bilaterally Heart: regular rate and rhythm, S1, S2  normal, no murmur, click, rub or gallop Abdomen: soft, non-tender; bowel sounds normal; no masses,  no organomegaly Extremities: extremities normal, atraumatic, no cyanosis or edema Pulses: 2+ and symmetric Skin: Skin color, texture, turgor normal. No rashes or lesions Neurologic: Grossly normal    Labs on Admission:   Recent Labs  12/02/13 1907 12/02/13 1915  NA 144 143  K 3.4* 3.6*  CL 103 102  CO2  --  24  GLUCOSE 111* 110*  BUN 32* 34*  CREATININE 1.50* 1.24*  CALCIUM  --  9.3  MG  --  1.5    Recent Labs  12/02/13 1915  AST 21  ALT 10  ALKPHOS 96  BILITOT 0.4  PROT 7.1  ALBUMIN 3.6    Recent Labs  12/02/13 1915  LIPASE 21    Recent Labs  12/02/13 1907 12/02/13 1915  WBC  --  5.8  NEUTROABS  --  4.1  HGB 11.2* 9.5*  HCT 33.0* 29.4*  MCV  --  84.2  PLT  --  173    Radiological Exams on Admission: Ct Head Wo Contrast  12/02/2013   CLINICAL DATA:  Altered level consciousness  EXAM: CT HEAD WITHOUT CONTRAST  TECHNIQUE: Contiguous axial images were obtained from the base of the skull through the vertex without intravenous contrast.  COMPARISON:  CT HEAD W/O CM dated 09/06/2012; CT HEAD W/O CM dated 08/16/2012  FINDINGS: Stable moderate atrophy. No hemorrhage, extra-axial fluid, or infarct. No mass or hydrocephalus. Moderate low attenuation in the deep white matter again identified consistent with chronic involutional change. Calvarium is intact.  IMPRESSION: Moderately severe age-related changes.  No acute findings.   Electronically Signed   By: Skipper Cliche M.D.   On: 12/02/2013 19:15   Dg Chest Port 1 View  12/02/2013   CLINICAL DATA:  Chest pain.  EXAM: PORTABLE CHEST - 1 VIEW  COMPARISON:  DG CHEST 1V PORT dated 08/10/2013  FINDINGS: Mediastinum hilar structures normal. Lungs are clear. Cardiomegaly. Normal pulmonary vascularity. No pleural effusion or pneumothorax.  IMPRESSION: 1. Stable cardiomegaly. 2. No acute cardiopulmonary disease clear    Electronically Signed   By: Marcello Moores  Register   On: 12/02/2013 19:25    Assessment/Plan  80 yo female with episode of confusion, resolved now  Principal Problem:   Altered mental state-  No clear reason why, ua is pending, r/o uti.  cxr neg.  obs overnight and do freq neuro cks.  Exam is normal now.  Defer any further neuro w/u unless something new develops.  Wbc normal, no fever.  Will serial card enzymes overnight also.  Active Problems:   HYPOTHYROIDISM   Renal insufficiency, mild   End of life care   Dementia   Delirium  Confirmed DNR/I status.  mose paper is at home.  Asuka Dusseau A 12/02/2013, 9:43 PM

## 2013-12-03 DIAGNOSIS — N179 Acute kidney failure, unspecified: Secondary | ICD-10-CM

## 2013-12-03 DIAGNOSIS — H409 Unspecified glaucoma: Secondary | ICD-10-CM

## 2013-12-03 DIAGNOSIS — E039 Hypothyroidism, unspecified: Secondary | ICD-10-CM

## 2013-12-03 LAB — GRAM STAIN

## 2013-12-03 LAB — CBC
HEMATOCRIT: 28 % — AB (ref 36.0–46.0)
HEMOGLOBIN: 9.1 g/dL — AB (ref 12.0–15.0)
MCH: 27.5 pg (ref 26.0–34.0)
MCHC: 32.5 g/dL (ref 30.0–36.0)
MCV: 84.6 fL (ref 78.0–100.0)
PLATELETS: 165 10*3/uL (ref 150–400)
RBC: 3.31 MIL/uL — AB (ref 3.87–5.11)
RDW: 15.6 % — ABNORMAL HIGH (ref 11.5–15.5)
WBC: 5 10*3/uL (ref 4.0–10.5)

## 2013-12-03 LAB — TROPONIN I: Troponin I: <0.3 ng/mL (ref <0.30)

## 2013-12-03 LAB — BASIC METABOLIC PANEL
BUN: 29 mg/dL — ABNORMAL HIGH (ref 6–23)
CHLORIDE: 106 meq/L (ref 96–112)
CO2: 27 mEq/L (ref 19–32)
Calcium: 9 mg/dL (ref 8.4–10.5)
Creatinine, Ser: 1.19 mg/dL — ABNORMAL HIGH (ref 0.50–1.10)
GFR calc Af Amer: 44 mL/min — ABNORMAL LOW (ref >90)
GFR calc non Af Amer: 38 mL/min — ABNORMAL LOW (ref >90)
Glucose, Bld: 98 mg/dL (ref 70–99)
Potassium: 3 mEq/L — ABNORMAL LOW (ref 3.7–5.3)
SODIUM: 146 meq/L (ref 137–147)

## 2013-12-03 LAB — TSH: TSH: 1.074 u[IU]/mL (ref 0.350–4.500)

## 2013-12-03 LAB — T4, FREE: Free T4: 1.42 ng/dL (ref 0.80–1.80)

## 2013-12-03 MED ORDER — LISINOPRIL 10 MG PO TABS: 10.0000 mg | ORAL_TABLET | Freq: Every day | ORAL | Status: DC

## 2013-12-03 MED ORDER — POTASSIUM CHLORIDE CRYS ER 20 MEQ PO TBCR
40.0000 meq | EXTENDED_RELEASE_TABLET | Freq: Two times a day (BID) | ORAL | Status: DC
  Administered 2013-12-03 (×2): 40 meq via ORAL
  Filled 2013-12-03 (×2): qty 2

## 2013-12-03 NOTE — Discharge Instructions (Signed)
You were significantly dehydrated.  This may have partially been due to HCTZ (Hydrochlorathiazide).  HCTZ is an ingredient in Zestoretic / Prinzide.   This medicine has been discontinued.   You will need to follow up with Dr. Linda Hedges regarding your blood pressure and microscopic blood in your stool.

## 2013-12-03 NOTE — Progress Notes (Signed)
NURSING PROGRESS NOTE  Mayumi Summerson [id number] Discharge Data: 12/03/2013 3:21 PM Attending Provider: Verlee Monte, MD WUJ:WJXBJYN Norins, MD     Guadalupe Maple to be D/C'd Home with home health per MD order.  Discussed with the patient the After Visit Summary and all questions fully answered. All IV's discontinued with no bleeding noted. All belongings returned to patient for patient to take home.   Last Vital Signs:  Blood pressure 116/65, pulse 82, temperature 98 F (36.7 C), temperature source Oral, resp. rate 18, height 5\' 1"  (1.549 m), weight 41.6 kg (91 lb 11.4 oz), SpO2 98.00%.  Discharge Medication List   Medication List    STOP taking these medications       lisinopril-hydrochlorothiazide 10-12.5 MG per tablet  Commonly known as:  PRINZIDE,ZESTORETIC      TAKE these medications       albuterol 108 (90 BASE) MCG/ACT inhaler  Commonly known as:  PROVENTIL HFA;VENTOLIN HFA  Inhale 1-2 puffs into the lungs every 4 (four) hours as needed for wheezing or shortness of breath.     aspirin EC 81 MG tablet  Take 81 mg by mouth every Monday, Wednesday, and Friday.     feeding supplement (ENSURE COMPLETE) Liqd  Take 237 mLs by mouth 2 (two) times daily between meals. Strawberry.     fluticasone 27.5 MCG/SPRAY nasal spray  Commonly known as:  VERAMYST  Place 2 sprays into the nose daily as needed. For allergies     isosorbide mononitrate 30 MG 24 hr tablet  Commonly known as:  IMDUR  Take 30 mg by mouth daily.     levothyroxine 88 MCG tablet  Commonly known as:  SYNTHROID, LEVOTHROID  Take 88 mcg by mouth daily before breakfast.     lisinopril 10 MG tablet  Commonly known as:  PRINIVIL,ZESTRIL  Take 1 tablet (10 mg total) by mouth daily.     nitroGLYCERIN 0.4 MG SL tablet  Commonly known as:  NITROSTAT  Place 0.4 mg under the tongue every 5 (five) minutes as needed. Chest pains     pantoprazole 40 MG tablet  Commonly known as:  PROTONIX  Take 40 mg by mouth  daily.     polyethylene glycol packet  Commonly known as:  MIRALAX / GLYCOLAX  Take 17 g by mouth daily. On Monday Wednesday and Friday.For constipation.     sertraline 25 MG tablet  Commonly known as:  ZOLOFT  Take 25 mg by mouth daily.     sucralfate 1 GM/10ML suspension  Commonly known as:  CARAFATE  Take 1 g by mouth 4 (four) times daily -  with meals and at bedtime.         Wallie Renshaw, RN

## 2013-12-03 NOTE — Discharge Summary (Signed)
Physician Discharge Summary  [name] ZDG:[id number] DOB: December 26, 1916 DOA: 12/02/2013  PCP: Adella Hare, MD  Admit date: 12/02/2013 Discharge date: 12/03/2013  Time spent: 40 minutes  Recommendations for Outpatient Follow-up:  Patient with dehydration and acute renal failure.  Admitted.  HCTZ discontinued. Heme + stool, with chronic anemia.  For outpatient work up if deemed appropriate.  Discharge Diagnoses:  Principal Problem:   Altered mental state Active Problems:   HYPOTHYROIDISM   Renal insufficiency, mild   End of life care   Dementia   Delirium   Discharge Condition: stable.  Mentation clear  Diet recommendation: regular soft diet.  Filed Weights   12/02/13 2215  Weight: 41.6 kg (91 lb 11.4 oz)    History of present illness:  80 yo female h/o mild dementia and cardiomyopathy, lives with her daughter and is still ambulatory with a walker.  She comes in with altered mental status. Daughter says that she went to take a nap around 3pm. When she awoke she was not talking normally and appeared confused. 911 was called, within a couple hours she was back to normal. There has been no n/v/d. No sob, no cough. No fevers. No swelling. No complaints of dyrsuria, no recent illnesses. No trouble walking. No rashes. Daughter has not noticed any focal neuro deficits except for the confusion. Pt denies any complaints at this time   Hospital Course:   Confusion With IV hydration the patient's mental status returned to normal. No acute changes on CT head.  Moderately severe age related chronic changes were noted. CXR was clear Urinalysis was negative for infection. I have spoken with her daughter, Jovonda Selner, and they would like to take her home.  She has 24 hour care. PT eval is pending for possible HHPT.  Acute renal failure Like pre-renal due to dehydration as well as a component of failure to thrive due to aging and dementia. Resolved with IVF. Per family patient eats  and drinks well HCTZ stopped.  If BMET is not normal on follow up PCP visit would consider stopping lisinopril as well.  Heme+ stool with normocytic anemia Patient has no over bleeding She is chronically anemic. Per family she is eating well and does not complain of stomach pain. Stable for outpatient work up as deemed appropriate by her PCP.  Hypokalemia Likely due to HCTZ and poor intake. Given kdur 40 meq x 2 prior to d/c Please check BMET in hospital follow up appointment.   Discharge Exam: Filed Vitals:   12/03/13 0945  BP: 118/60  Pulse:   Temp:   Resp:     General:  Awake, Alert, Cachetic female, very HOH, lying comfortably in bed Neck: Supple without lymphadenopathy CV:  Rrr, no m/r/g Resp:  CTA no w/c/r Abdomen:  Thin, nt, nd, +bs, no masses Extremities:  No C/C/E, able to move all 4 extremities against gravity.  Discharge Instructions      Discharge Orders   Future Orders Complete By Expires   Diet general  As directed    Comments:     Soft diet   Increase activity slowly  As directed        Medication List    STOP taking these medications       lisinopril-hydrochlorothiazide 10-12.5 MG per tablet  Commonly known as:  PRINZIDE,ZESTORETIC      TAKE these medications       albuterol 108 (90 BASE) MCG/ACT inhaler  Commonly known as:  PROVENTIL HFA;VENTOLIN HFA  Inhale 1-2 puffs  into the lungs every 4 (four) hours as needed for wheezing or shortness of breath.     aspirin EC 81 MG tablet  Take 81 mg by mouth every Monday, Wednesday, and Friday.     feeding supplement (ENSURE COMPLETE) Liqd  Take 237 mLs by mouth 2 (two) times daily between meals. Strawberry.     fluticasone 27.5 MCG/SPRAY nasal spray  Commonly known as:  VERAMYST  Place 2 sprays into the nose daily as needed. For allergies     isosorbide mononitrate 30 MG 24 hr tablet  Commonly known as:  IMDUR  Take 30 mg by mouth daily.     levothyroxine 88 MCG tablet  Commonly known  as:  SYNTHROID, LEVOTHROID  Take 88 mcg by mouth daily before breakfast.     lisinopril 10 MG tablet  Commonly known as:  PRINIVIL,ZESTRIL  Take 1 tablet (10 mg total) by mouth daily.     nitroGLYCERIN 0.4 MG SL tablet  Commonly known as:  NITROSTAT  Place 0.4 mg under the tongue every 5 (five) minutes as needed. Chest pains     pantoprazole 40 MG tablet  Commonly known as:  PROTONIX  Take 40 mg by mouth daily.     polyethylene glycol packet  Commonly known as:  MIRALAX / GLYCOLAX  Take 17 g by mouth daily. On Monday Wednesday and Friday.For constipation.     sertraline 25 MG tablet  Commonly known as:  ZOLOFT  Take 25 mg by mouth daily.     sucralfate 1 GM/10ML suspension  Commonly known as:  CARAFATE  Take 1 g by mouth 4 (four) times daily -  with meals and at bedtime.       No Known Allergies Follow-up Information   Follow up with Adella Hare, MD In 1 week.   Specialty:  Internal Medicine   Contact information:   [address] [place] [address] [phone]        The results of significant diagnostics from this hospitalization (including imaging, microbiology, ancillary and laboratory) are listed below for reference.    Significant Diagnostic Studies: Ct Head Wo Contrast  12/02/2013   CLINICAL DATA:  Altered level consciousness  EXAM: CT HEAD WITHOUT CONTRAST  TECHNIQUE: Contiguous axial images were obtained from the base of the skull through the vertex without intravenous contrast.  COMPARISON:  CT HEAD W/O CM dated 09/06/2012; CT HEAD W/O CM dated 08/16/2012  FINDINGS: Stable moderate atrophy. No hemorrhage, extra-axial fluid, or infarct. No mass or hydrocephalus. Moderate low attenuation in the deep white matter again identified consistent with chronic involutional change. Calvarium is intact.  IMPRESSION: Moderately severe age-related changes.  No acute findings.   Electronically Signed   By: Skipper Cliche M.D.   On: 12/02/2013 19:15   Dg Chest Port  1 View  12/02/2013   CLINICAL DATA:  Chest pain.  EXAM: PORTABLE CHEST - 1 VIEW  COMPARISON:  DG CHEST 1V PORT dated 08/10/2013  FINDINGS: Mediastinum hilar structures normal. Lungs are clear. Cardiomegaly. Normal pulmonary vascularity. No pleural effusion or pneumothorax.  IMPRESSION: 1. Stable cardiomegaly. 2. No acute cardiopulmonary disease clear   Electronically Signed   By: Marcello Moores  Register   On: 12/02/2013 19:25    Microbiology: Recent Results (from the past 240 hour(s))  GRAM STAIN     Status: None   Collection Time    12/02/13  8:53 PM      Result Value Ref Range Status   Specimen Description URINE, CATHETERIZED  Final   Special Requests NONE   Final   Gram Stain     Final   Value: WBC PRESENT, PREDOMINANTLY MONONUCLEAR     NO ORGANISMS SEEN   Report Status 12/02/2013 FINAL   Final     Labs: Basic Metabolic Panel:  Recent Labs Lab 12/02/13 1907 12/02/13 1915 12/03/13 0420  NA 144 143 146  K 3.4* 3.6* 3.0*  CL 103 102 106  CO2  --  24 27  GLUCOSE 111* 110* 98  BUN 32* 34* 29*  CREATININE 1.50* 1.24* 1.19*  CALCIUM  --  9.3 9.0  MG  --  1.5  --    Liver Function Tests:  Recent Labs Lab 12/02/13 1915  AST 21  ALT 10  ALKPHOS 96  BILITOT 0.4  PROT 7.1  ALBUMIN 3.6    Recent Labs Lab 12/02/13 1915  LIPASE 21   CBC:  Recent Labs Lab 12/02/13 1907 12/02/13 1915 12/03/13 0420  WBC  --  5.8 5.0  NEUTROABS  --  4.1  --   HGB 11.2* 9.5* 9.1*  HCT 33.0* 29.4* 28.0*  MCV  --  84.2 84.6  PLT  --  173 165   Cardiac Enzymes:  Recent Labs Lab 12/02/13 2248 12/03/13 0430 12/03/13 0937  TROPONINI <0.30 <0.30 <0.30   BNP: BNP (last 3 results)  Recent Labs  02/25/13 0048 12/02/13 1852  PROBNP 269.7 205.4       SignedKaren Kitchens [phone]  [facility] 12/03/2013, 11:41 AM

## 2013-12-03 NOTE — Progress Notes (Signed)
12/03/13 Spoke with patient, she agreed to me calling her daugther Nelia Shi to discuss HHPT. Contacted Ms Irish Elders and discussed HHPT. She selected [facility]. Contacted Donna at [facility] and set up [place]. Patient has an aide who stays with her while her daughter is at work. No equipment needs identified. Fuller Plan RN, BSN, CCM

## 2013-12-03 NOTE — Evaluation (Signed)
Physical Therapy Evaluation Patient Details Name: Devann Cribb MRN: [id number] DOB: 09-12-1917 Today's Date: 12/03/2013 Time: 8242-3536 PT Time Calculation (min): 16 min  PT Assessment / Plan / Recommendation History of Present Illness  80 yo female h/o mild dementia lives with her dtr still ambulatory with a walker comes in with altered mental status.   Clinical Impression  Pt adm due to the above. Presents with decreased independence with functional mobility secondary to deficits indicated below. Pt to benefit from skilled acute PT to address deficits and maximize mobility prior to D/C home with daughter. Per pt, pt has 24/7 (A) from daughter and aide. Pt requiring mod (A) for mobility at this time; may be appropriate to be transfers to bed <> toilet <> wheelchair only until HHPT can assess and work with pt. Pt planned to D/C home today. No DME needs at this time.    PT Assessment  Patient needs continued PT services    Follow Up Recommendations  Home health PT;Supervision/Assistance - 24 hour    Does the patient have the potential to tolerate intense rehabilitation      Barriers to Discharge        Equipment Recommendations  None recommended by PT    Recommendations for Other Services     Frequency Min 3X/week    Precautions / Restrictions Precautions Precautions: Fall Restrictions Weight Bearing Restrictions: No   Pertinent Vitals/Pain C/o back pain; did not rate      Mobility  Bed Mobility Overal bed mobility: Needs Assistance Bed Mobility: Supine to Sit Supine to sit: Min assist;HOB elevated General bed mobility comments: requires (A) to elevate trunk to sitting position; cues for sequencing  Transfers Overall transfer level: Needs assistance Equipment used: 1 person hand held assist Transfers: Sit to/from [facility] Sit to Stand: Mod assist Stand pivot transfers: Mod assist General transfer comment: (A) to maintain balance and perform  pivotal steps to chair; pt with fwd flexed trunk and leaning posteriorly; max cues throughout for safety and sequencing     Exercises General Exercises - Lower Extremity Ankle Circles/Pumps: AROM;Both;10 reps;Seated   PT Diagnosis: Difficulty walking;Generalized weakness  PT Problem List: Decreased strength;Decreased activity tolerance;Decreased balance;Decreased mobility;Decreased cognition;Decreased knowledge of use of DME;Decreased safety awareness PT Treatment Interventions: DME instruction;Gait training;Functional mobility training;Therapeutic activities;Therapeutic exercise;Balance training;Neuromuscular re-education;Patient/family education     PT Goals(Current goals can be found in the care plan section) Acute Rehab PT Goals Patient Stated Goal: to go home with daughter PT Goal Formulation: With patient Time For Goal Achievement: 12/17/13 Potential to Achieve Goals: Fair  Visit Information  Last PT Received On: 12/03/13 Assistance Needed: +1 (+2 if walking ) History of Present Illness: 80 yo female h/o mild dementia lives with her dtr still ambulatory with a walker comes in with altered mental status.        Prior [place] expects to be discharged to:: Private residence Living Arrangements: Children Available Help at Discharge: Family;Available 24 hours/day;Personal care attendant Type of Home: House Home Access: Level entry Home Layout: One level Home Equipment: Walker - 2 wheels;Bedside commode;Wheelchair - manual Additional Comments: no family present; pt with dementia; reports she has an aide when her daughter is at work  Prior Function Level of Independence: Needs assistance Gait / Transfers Assistance Needed: pt reports her daughter or aide would walk with her; reports she ambulates with cane  ADL's / Homemaking Assistance Needed: requires (A) from aide  Communication Communication: Expressive difficulties;HOH    Cognition  Cognition Arousal/Alertness: Awake/alert Behavior During Therapy: WFL for tasks assessed/performed Overall Cognitive Status: History of cognitive impairments - at baseline    Extremity/Trunk Assessment Upper Extremity Assessment Upper Extremity Assessment: Generalized weakness Lower Extremity Assessment Lower Extremity Assessment: Generalized weakness Cervical / Trunk Assessment Cervical / Trunk Assessment: Kyphotic   Balance Balance Overall balance assessment: Needs assistance Sitting-balance support: Feet supported;Bilateral upper extremity supported Sitting balance-Leahy Scale: Poor Sitting balance - Comments: bil UE supported  Postural control: Posterior lean Standing balance support: Bilateral upper extremity supported;During functional activity Standing balance-Leahy Scale: Zero Standing balance comment: bil UE supported and mod (A) to maintain balance  General Comments General comments (skin integrity, edema, etc.): pt wears glasses at all times   End of Session PT - End of Session Equipment Utilized During Treatment: Gait belt Activity Tolerance: Patient tolerated treatment well Patient left: in chair;with call bell/phone within reach;with chair alarm set Nurse Communication: Mobility status  GP Functional Assessment Tool Used: clinical judgement  Functional Limitation: Mobility: Walking and moving around Mobility: Walking and Moving Around Current Status [phone]): At least 40 percent but less than 60 percent impaired, limited or restricted Mobility: Walking and Moving Around Goal Status [phone]): At least 1 percent but less than 20 percent impaired, limited or restricted Mobility: Walking and Moving Around Discharge Status [phone]): At least 40 percent but less than 60 percent impaired, limited or restricted   Gustavus Bryant, [place] [phone] 12/03/2013, 12:14 PM

## 2013-12-03 NOTE — Discharge Summary (Signed)
Addendum  Patient seen and examined, chart and data base reviewed.  I agree with the above assessment and plan.  For full details please see Mrs. Imogene Burn PA note.  Altered mental status and confusion, likely secondary to dehydration and acute renal failure.  Hypokalemia repleted with oral supplements, patient to be discharged home today.   Birdie Hopes, MD [facility] Pager: [phone] 12/03/2013, 1:47 PM

## 2013-12-04 NOTE — ED Provider Notes (Signed)
I have personally seen and examined the patient.  I have discussed the plan of care with the resident.  I have reviewed the documentation on PMH/FH/Soc. History.  I have reviewed the documentation of the resident and agree.  Orlie Dakin, MD 12/04/13 1544

## 2013-12-09 ENCOUNTER — Other Ambulatory Visit: Payer: Self-pay | Admitting: Internal Medicine

## 2013-12-10 ENCOUNTER — Ambulatory Visit (INDEPENDENT_AMBULATORY_CARE_PROVIDER_SITE_OTHER): Payer: Medicare HMO | Admitting: Internal Medicine

## 2013-12-10 ENCOUNTER — Encounter: Payer: Self-pay | Admitting: Internal Medicine

## 2013-12-10 ENCOUNTER — Other Ambulatory Visit: Payer: Commercial Managed Care - HMO

## 2013-12-10 VITALS — BP 142/70 | HR 84 | Temp 97.8°F

## 2013-12-10 DIAGNOSIS — N289 Disorder of kidney and ureter, unspecified: Secondary | ICD-10-CM

## 2013-12-10 DIAGNOSIS — R4182 Altered mental status, unspecified: Secondary | ICD-10-CM

## 2013-12-10 DIAGNOSIS — I10 Essential (primary) hypertension: Secondary | ICD-10-CM

## 2013-12-10 LAB — BASIC METABOLIC PANEL
BUN: 25 mg/dL — AB (ref 6–23)
CALCIUM: 9.5 mg/dL (ref 8.4–10.5)
CO2: 28 meq/L (ref 19–32)
CREATININE: 1.1 mg/dL (ref 0.4–1.2)
Chloride: 106 mEq/L (ref 96–112)
GFR: 56.99 mL/min — ABNORMAL LOW (ref >60.00)
GLUCOSE: 99 mg/dL (ref 70–99)
Potassium: 4.1 mEq/L (ref 3.5–5.1)
Sodium: 142 mEq/L (ref 135–145)

## 2013-12-10 MED ORDER — SUCRALFATE 1 GM/10ML PO SUSP: 1.0000 g | Freq: Three times a day (TID) | ORAL | Status: DC

## 2013-12-10 NOTE — Progress Notes (Signed)
Pre visit review using our clinic review tool, if applicable. No additional management support is needed unless otherwise documented below in the visit note. 

## 2013-12-10 NOTE — Progress Notes (Signed)
Subjective:    Patient ID: [name], female    DOB: [birth year], 80 y.o.   MRN: [id number]  HPI [name] was hospitalized March 17-18, '15 for mental status changes. All hospital records reviewed. CT brain ok, CXR clear. Her labs did reveal minor renal insufficiency to creatinine 1.24, lytes were bascially normal. Hgb 9.1-9.6 which is her baseline.   Since discharge she has been doing well except for a little confusion last night. She reports occasional sharp pain below the umbilicus from time to time despite being on Protonix daily and carafate AC/HS.  She does appear very frail and weak but still alert.  Past Medical History  Diagnosis Date  . Internal hemorrhoid   . Diverticulosis of colon (without mention of hemorrhage)   . Herpes zoster   . Glaucoma   . Lumbar back pain   . Hypertension   . Cardiomyopathy   . Hypothyroid   . Myocardial infarction   . History of blood transfusion     "related to diverticulitis" (12/02/2013)  . GERD (gastroesophageal reflux disease)   . Arthritis     "joints" (12/02/2013)  . Depression   . Anxiety    Past Surgical History  Procedure Laterality Date  . Thyroidectomy    . Appendectomy    . Tubal ligation    . Colonoscopy  07/19/2012    Procedure: COLONOSCOPY;  Surgeon: Ladene Artist, MD,FACG;  Location: [facility]COPY;  Service: Endoscopy;  Laterality: N/A;  . Cataract extraction w/ intraocular lens  implant, bilateral Bilateral   . Glaucoma surgery Right    History reviewed. No pertinent family history. History   Social History  . Marital Status: Widowed    Spouse Name: N/A    Number of Children: N/A  . Years of Education: N/A   Occupational History  . Not on file.   Social History Main Topics  . Smoking status: Never Smoker   . Smokeless tobacco: Never Used  . Alcohol Use: No  . Drug Use: No  . Sexual Activity: No   Other Topics Concern  . Not on file   Social History Narrative   Lives alone but family stay with  her at night. She remains independent. She has a DNR established but does want medical care.     Current Outpatient Prescriptions on File Prior to Visit  Medication Sig Dispense Refill  . albuterol (PROVENTIL HFA;VENTOLIN HFA) 108 (90 BASE) MCG/ACT inhaler Inhale 1-2 puffs into the lungs every 4 (four) hours as needed for wheezing or shortness of breath.      Marland Kitchen aspirin EC 81 MG tablet Take 81 mg by mouth every Monday, Wednesday, and Friday.      . feeding supplement, ENSURE COMPLETE, (ENSURE COMPLETE) LIQD Take 237 mLs by mouth 2 (two) times daily between meals. Strawberry.      . fluticasone (VERAMYST) 27.5 MCG/SPRAY nasal spray Place 2 sprays into the nose daily as needed. For allergies      . isosorbide mononitrate (IMDUR) 30 MG 24 hr tablet Take 30 mg by mouth daily.      Marland Kitchen levothyroxine (SYNTHROID, LEVOTHROID) 88 MCG tablet TAKE 1 TABLET BY MOUTH ONCE A DAY  30 tablet  5  . lisinopril (PRINIVIL,ZESTRIL) 10 MG tablet Take 1 tablet (10 mg total) by mouth daily.  30 tablet  3  . nitroGLYCERIN (NITROSTAT) 0.4 MG SL tablet Place 0.4 mg under the tongue every 5 (five) minutes as needed. Chest pains      .  pantoprazole (PROTONIX) 40 MG tablet Take 40 mg by mouth daily.      . polyethylene glycol (MIRALAX / GLYCOLAX) packet Take 17 g by mouth daily. On Monday Wednesday and Friday.For constipation.      . sertraline (ZOLOFT) 25 MG tablet Take 25 mg by mouth daily.      . sucralfate (CARAFATE) 1 GM/10ML suspension Take 1 g by mouth 4 (four) times daily -  with meals and at bedtime.      . [DISCONTINUED] isosorbide dinitrate (ISORDIL) 30 MG tablet Take 30 mg by mouth 3 (three) times daily.        . [DISCONTINUED] ranitidine (ZANTAC) 150 MG tablet Take 150 mg by mouth 2 (two) times daily.         No current facility-administered medications on file prior to visit.      Review of Systems System review is negative for any constitutional, cardiac, pulmonary, GI or neuro symptoms or complaints other  than as described in the HPI.     Objective:   Physical Exam Filed Vitals:   12/10/13 1551  BP: 142/70  Pulse: 84  Temp: 97.8 F (36.6 C)   Wt Readings from Last 3 Encounters:  12/02/13 91 lb 11.4 oz (41.6 kg)  08/21/13 90 lb (40.824 kg)  08/10/13 90 lb 11.2 oz (41.141 kg)   Gen'l- very old, emaciated woman in no distress HEENT- arcus senilis, pinpoint pupils Neck -p very thin Cor - 2+ radial, RRR Pul - normal breath sounds, no increased WOB, lungs - cTAP Neuro - soft spoken  But alert.        Assessment & Plan:  Mental status changes - [name] based on exam and daughters report is at her baseline. No further evaluation indicated.

## 2013-12-10 NOTE — Patient Instructions (Signed)
I am sorry that you had a bad spell but glad that all went well in the hospital and since being home. Today  Blood pressure is good and your exam is normal.   Plan Recheck lab - looking at kidney function  No change in medication from time of hospital discharge  For stomach - continue Protonix and carafate. For sharp pain it is ok to try a liquid antacid.  For follow up care you will see [name] Gardner, PA-c in 4-6 weeks.   For allergy - Flonase is over the counter and will work fine.

## 2013-12-12 ENCOUNTER — Encounter: Payer: Self-pay | Admitting: Internal Medicine

## 2013-12-12 ENCOUNTER — Telehealth: Payer: Self-pay | Admitting: Physician Assistant

## 2013-12-12 MED ORDER — FUROSEMIDE 20 MG PO TABS: 10.0000 mg | ORAL_TABLET | Freq: Every day | ORAL | Status: DC

## 2013-12-12 NOTE — Telephone Encounter (Signed)
Defer to Dr. Linda Hedges

## 2013-12-12 NOTE — Telephone Encounter (Signed)
Patient daughter has been notified to pick up Lasix. She is also to still be taking Lisinopril 10 mg

## 2013-12-12 NOTE — Telephone Encounter (Signed)
Ok to start a diuretic, based on age will use low dose lasix (furosemide) - Rx sent to pharmacy.

## 2013-12-12 NOTE — Telephone Encounter (Signed)
The patient's bp medicine was changed from [place] to just Lisinopril.  She just started it last week when she went to the ER.  She saw Dr. Linda Hedges this week.  Her feet and legs are very swollen.  Her feet are cold.  No pain in her feet and can wiggle her toes per her CNA Geneticist, molecular from [employer])

## 2013-12-14 NOTE — Assessment & Plan Note (Signed)
Since hospital discharge she has been doing well with good PO intake of fluids. Follow up lab 12/10/13 with Creatinine 1.1  Plan Mild renal insufficiency resolved.

## 2013-12-14 NOTE — Assessment & Plan Note (Signed)
All hospital records reviewed. Since d/c per patient and family report she has been at her baseline.  Problem resolved. She is at risk for recurrence given her advanced age and fragile condition.

## 2014-01-07 ENCOUNTER — Ambulatory Visit: Payer: Medicare HMO | Admitting: Physician Assistant

## 2014-01-13 ENCOUNTER — Ambulatory Visit (INDEPENDENT_AMBULATORY_CARE_PROVIDER_SITE_OTHER): Payer: Commercial Managed Care - HMO | Admitting: Internal Medicine

## 2014-01-13 ENCOUNTER — Encounter: Payer: Self-pay | Admitting: Internal Medicine

## 2014-01-13 VITALS — BP 162/80 | HR 86 | Temp 98.9°F | Wt 96.2 lb

## 2014-01-13 DIAGNOSIS — I10 Essential (primary) hypertension: Secondary | ICD-10-CM

## 2014-01-13 DIAGNOSIS — N289 Disorder of kidney and ureter, unspecified: Secondary | ICD-10-CM

## 2014-01-13 DIAGNOSIS — R609 Edema, unspecified: Secondary | ICD-10-CM

## 2014-01-13 MED ORDER — FUROSEMIDE 20 MG PO TABS: 20.0000 mg | ORAL_TABLET | Freq: Every day | ORAL | Status: DC | PRN

## 2014-01-13 NOTE — Progress Notes (Signed)
Subjective:    Patient ID: [name], female    DOB: [birth year], 80 y.o.   MRN: [id number]  HPI   To establish with new pcp, with daughter; pt with recent LGI bleed and daughter quite concerned will happen again at any time but is supportive and watchful, no further BRBPR, abd pain, n/v, worsening general weakness, dizziness.  Recently has lisinoprilHCT 10/12.5 changed to lisinopril 10 mg only, but with some worsening distal LE swelling then lasix 10 mg (half of 20 mg) was added per PA.  Daughter read side effect which mentioned dehydration so none was given.  Pt with worsening left > right edema to mid legs but Pt denies o/w chest pain, increased sob or doe, wheezing, orthopnea, PND, increased LE swelling, palpitations, dizziness or syncope. Has known cardiomyopathy and CKD.  Daughter giving her extra fluids recently to "flush out the swelling." Past Medical History  Diagnosis Date  . Internal hemorrhoid   . Diverticulosis of colon (without mention of hemorrhage)   . Herpes zoster   . Glaucoma   . Lumbar back pain   . Hypertension   . Cardiomyopathy   . Hypothyroid   . Myocardial infarction   . History of blood transfusion     "related to diverticulitis" (12/02/2013)  . GERD (gastroesophageal reflux disease)   . Arthritis     "joints" (12/02/2013)  . Depression   . Anxiety    Past Surgical History  Procedure Laterality Date  . Thyroidectomy    . Appendectomy    . Tubal ligation    . Colonoscopy  07/19/2012    Procedure: COLONOSCOPY;  Surgeon: Ladene Artist, MD,FACG;  Location: [facility];  Service: Endoscopy;  Laterality: N/A;  . Cataract extraction w/ intraocular lens  implant, bilateral Bilateral   . Glaucoma surgery Right     reports that she has never smoked. She has never used smokeless tobacco. She reports that she does not drink alcohol or use illicit drugs. family history is not on file. No Known Allergies Current Outpatient Prescriptions on File Prior to Visit    Medication Sig Dispense Refill  . albuterol (PROVENTIL HFA;VENTOLIN HFA) 108 (90 BASE) MCG/ACT inhaler Inhale 1-2 puffs into the lungs every 4 (four) hours as needed for wheezing or shortness of breath.      Marland Kitchen aspirin EC 81 MG tablet Take 81 mg by mouth every Monday, Wednesday, and Friday.      . feeding supplement, ENSURE COMPLETE, (ENSURE COMPLETE) LIQD Take 237 mLs by mouth 2 (two) times daily between meals. Strawberry.      . isosorbide mononitrate (IMDUR) 30 MG 24 hr tablet Take 30 mg by mouth daily.      Marland Kitchen levothyroxine (SYNTHROID, LEVOTHROID) 88 MCG tablet TAKE 1 TABLET BY MOUTH ONCE A DAY  30 tablet  5  . lisinopril (PRINIVIL,ZESTRIL) 10 MG tablet Take 1 tablet (10 mg total) by mouth daily.  30 tablet  3  . nitroGLYCERIN (NITROSTAT) 0.4 MG SL tablet Place 0.4 mg under the tongue every 5 (five) minutes as needed. Chest pains      . pantoprazole (PROTONIX) 40 MG tablet Take 40 mg by mouth daily.      . polyethylene glycol (MIRALAX / GLYCOLAX) packet Take 17 g by mouth daily. On Monday Wednesday and Friday.For constipation.      . sertraline (ZOLOFT) 25 MG tablet Take 25 mg by mouth daily.      . sucralfate (CARAFATE) 1 GM/10ML suspension Take 10 mLs (1 g  total) by mouth 4 (four) times daily -  with meals and at bedtime.  420 mL  3  . [DISCONTINUED] isosorbide dinitrate (ISORDIL) 30 MG tablet Take 30 mg by mouth 3 (three) times daily.        . [DISCONTINUED] ranitidine (ZANTAC) 150 MG tablet Take 150 mg by mouth 2 (two) times daily.         No current facility-administered medications on file prior to visit.   Review of Systems  Constitutional: Negative for unusual diaphoresis or other sweats  HENT: Negative for ringing in ear Eyes: Negative for double vision or worsening visual disturbance.  Respiratory: Negative for choking and stridor.   Gastrointestinal: Negative for vomiting or other signifcant bowel change Genitourinary: Negative for hematuria or decreased urine volume.   Musculoskeletal: Negative for other MSK pain or swelling Skin: Negative for color change and worsening wound.  Neurological: Negative for tremors and numbness other than noted  Psychiatric/Behavioral: Negative for decreased concentration or agitation other than above       Objective:   Physical Exam BP 162/80  Pulse 86  Temp(Src) 98.9 F (37.2 C) (Oral)  Wt 96 lb 4 oz (43.659 kg)  SpO2 98% VS noted, not ill appearing Constitutional: Pt appears well-developed, well-nourished.  HENT: Head: NCAT.  Right Ear: External ear normal.  Left Ear: External ear normal.  Eyes: . Pupils are equal, round, and reactive to light. Conjunctivae and EOM are normal Neck: Normal range of motion. Neck supple.  Cardiovascular: Normal rate and regular rhythm.   Pulmonary/Chest: Effort normal and breath sounds normal.  Abd:  Soft, NT, ND, + BS Neurological: Pt is alert. Not confused , motor grossly intact Skin: Skin is warm. No rash but 1+ edema bilat left > right to mid left on leg, pedal on right Psychiatric: Pt behavior is normal. No agitation.     Assessment & Plan:

## 2014-01-13 NOTE — Patient Instructions (Signed)
Ok to take the lasix 20 mg only if you swelling  Please continue all other medications as before, and refills have been done if requested. Please have the pharmacy call with any other refills you may need.  Please continue your efforts at being more active, low cholesterol diet, and weight control.  Please return in 3 months, or sooner if needed

## 2014-01-13 NOTE — Progress Notes (Signed)
Pre visit review using our clinic review tool, if applicable. No additional management support is needed unless otherwise documented below in the visit note. 

## 2014-01-14 ENCOUNTER — Ambulatory Visit: Payer: Medicare HMO | Admitting: Internal Medicine

## 2014-01-16 DIAGNOSIS — Z0001 Encounter for general adult medical examination with abnormal findings: Secondary | ICD-10-CM | POA: Insufficient documentation

## 2014-01-16 DIAGNOSIS — R600 Localized edema: Secondary | ICD-10-CM | POA: Insufficient documentation

## 2014-01-16 DIAGNOSIS — R609 Edema, unspecified: Secondary | ICD-10-CM | POA: Insufficient documentation

## 2014-01-16 NOTE — Assessment & Plan Note (Addendum)
Suspect mostly venous insufficiency, though has hx of mild CM and CKD, last EF measured 2011 at 55-60%; ok for lasix 20 mg qd prn, daughter educated and reassured,  to f/u any worsening symptoms or concerns, f/u next viist, cont leg elevation, low salt, dont push oral fluids

## 2014-01-16 NOTE — Assessment & Plan Note (Signed)
stable overall by history and exam, recent data reviewed with pt, and pt to continue medical treatment as before,  to f/u any worsening symptoms or concerns Lab Results  Component Value Date   WBC 5.0 12/03/2013   HGB 9.1* 12/03/2013   HCT 28.0* 12/03/2013   PLT 165 12/03/2013   GLUCOSE 99 12/10/2013   CHOL 258* 01/07/2008   TRIG 78 01/07/2008   HDL 98.8 01/07/2008   LDLDIRECT 140.0 01/07/2008   ALT 10 12/02/2013   AST 21 12/02/2013   NA 142 12/10/2013   K 4.1 12/10/2013   CL 106 12/10/2013   CREATININE 1.1 12/10/2013   BUN 25* 12/10/2013   CO2 28 12/10/2013   TSH 1.074 12/02/2013   INR 1.13 07/02/2011   For f/u next visit as well

## 2014-01-16 NOTE — Assessment & Plan Note (Signed)
elev today for unclear reasons except for volume increase recently off the hct likely to be related , o/w stable overall by history and exam, recent data reviewed with pt, and pt to continue medical treatment as before and add the lasix as rx,  to f/u any worsening symptoms or concerns BP Readings from Last 3 Encounters:  01/13/14 162/80  12/10/13 142/70  12/03/13 116/65

## 2014-03-01 ENCOUNTER — Encounter (HOSPITAL_COMMUNITY): Payer: Self-pay | Admitting: Emergency Medicine

## 2014-03-01 ENCOUNTER — Inpatient Hospital Stay (HOSPITAL_COMMUNITY)
Admission: EM | Admit: 2014-03-01 | Discharge: 2014-03-04 | DRG: 377 | Disposition: A | Discharge status: Home / Self Care | Payer: Medicare HMO | Attending: Internal Medicine | Admitting: Internal Medicine

## 2014-03-01 DIAGNOSIS — H409 Unspecified glaucoma: Secondary | ICD-10-CM | POA: Diagnosis present

## 2014-03-01 DIAGNOSIS — K922 Gastrointestinal hemorrhage, unspecified: Secondary | ICD-10-CM

## 2014-03-01 DIAGNOSIS — D126 Benign neoplasm of colon, unspecified: Secondary | ICD-10-CM | POA: Diagnosis present

## 2014-03-01 DIAGNOSIS — I252 Old myocardial infarction: Secondary | ICD-10-CM | POA: Diagnosis not present

## 2014-03-01 DIAGNOSIS — K224 Dyskinesia of esophagus: Secondary | ICD-10-CM | POA: Diagnosis present

## 2014-03-01 DIAGNOSIS — E039 Hypothyroidism, unspecified: Secondary | ICD-10-CM

## 2014-03-01 DIAGNOSIS — Z681 Body mass index (BMI) 19 or less, adult: Secondary | ICD-10-CM

## 2014-03-01 DIAGNOSIS — R64 Cachexia: Secondary | ICD-10-CM | POA: Diagnosis present

## 2014-03-01 DIAGNOSIS — Z860100 Personal history of colon polyps, unspecified: Secondary | ICD-10-CM

## 2014-03-01 DIAGNOSIS — D62 Acute posthemorrhagic anemia: Secondary | ICD-10-CM

## 2014-03-01 DIAGNOSIS — K921 Melena: Secondary | ICD-10-CM | POA: Diagnosis present

## 2014-03-01 DIAGNOSIS — F3289 Other specified depressive episodes: Secondary | ICD-10-CM | POA: Diagnosis present

## 2014-03-01 DIAGNOSIS — Z66 Do not resuscitate: Secondary | ICD-10-CM | POA: Diagnosis present

## 2014-03-01 DIAGNOSIS — Z9849 Cataract extraction status, unspecified eye: Secondary | ICD-10-CM

## 2014-03-01 DIAGNOSIS — K219 Gastro-esophageal reflux disease without esophagitis: Secondary | ICD-10-CM | POA: Diagnosis present

## 2014-03-01 DIAGNOSIS — Z961 Presence of intraocular lens: Secondary | ICD-10-CM | POA: Diagnosis not present

## 2014-03-01 DIAGNOSIS — K648 Other hemorrhoids: Secondary | ICD-10-CM | POA: Diagnosis present

## 2014-03-01 DIAGNOSIS — Z9851 Tubal ligation status: Secondary | ICD-10-CM | POA: Diagnosis not present

## 2014-03-01 DIAGNOSIS — R4181 Age-related cognitive decline: Secondary | ICD-10-CM | POA: Diagnosis present

## 2014-03-01 DIAGNOSIS — F329 Major depressive disorder, single episode, unspecified: Secondary | ICD-10-CM | POA: Diagnosis present

## 2014-03-01 DIAGNOSIS — E41 Nutritional marasmus: Secondary | ICD-10-CM | POA: Diagnosis present

## 2014-03-01 DIAGNOSIS — I10 Essential (primary) hypertension: Secondary | ICD-10-CM

## 2014-03-01 DIAGNOSIS — Z602 Problems related to living alone: Secondary | ICD-10-CM

## 2014-03-01 DIAGNOSIS — N179 Acute kidney failure, unspecified: Secondary | ICD-10-CM

## 2014-03-01 DIAGNOSIS — R627 Adult failure to thrive: Secondary | ICD-10-CM

## 2014-03-01 DIAGNOSIS — F0390 Unspecified dementia without behavioral disturbance: Secondary | ICD-10-CM | POA: Diagnosis present

## 2014-03-01 DIAGNOSIS — K5731 Diverticulosis of large intestine without perforation or abscess with bleeding: Secondary | ICD-10-CM | POA: Diagnosis present

## 2014-03-01 DIAGNOSIS — Z79899 Other long term (current) drug therapy: Secondary | ICD-10-CM | POA: Diagnosis not present

## 2014-03-01 DIAGNOSIS — K5730 Diverticulosis of large intestine without perforation or abscess without bleeding: Secondary | ICD-10-CM

## 2014-03-01 DIAGNOSIS — K626 Ulcer of anus and rectum: Secondary | ICD-10-CM | POA: Diagnosis present

## 2014-03-01 DIAGNOSIS — R4182 Altered mental status, unspecified: Secondary | ICD-10-CM

## 2014-03-01 DIAGNOSIS — F411 Generalized anxiety disorder: Secondary | ICD-10-CM | POA: Diagnosis present

## 2014-03-01 DIAGNOSIS — Z9089 Acquired absence of other organs: Secondary | ICD-10-CM | POA: Diagnosis not present

## 2014-03-01 DIAGNOSIS — I428 Other cardiomyopathies: Secondary | ICD-10-CM

## 2014-03-01 DIAGNOSIS — K225 Diverticulum of esophagus, acquired: Secondary | ICD-10-CM | POA: Diagnosis present

## 2014-03-01 HISTORY — DX: Unspecified protein-calorie malnutrition: E46

## 2014-03-01 HISTORY — DX: Diverticulum of esophagus, acquired: K22.5

## 2014-03-01 HISTORY — DX: Hypo-osmolality and hyponatremia: E87.1

## 2014-03-01 LAB — BASIC METABOLIC PANEL
BUN: 28 mg/dL — ABNORMAL HIGH (ref 6–23)
CALCIUM: 9.4 mg/dL (ref 8.4–10.5)
CO2: 27 mEq/L (ref 19–32)
CREATININE: 1.17 mg/dL — AB (ref 0.50–1.10)
Chloride: 104 mEq/L (ref 96–112)
GFR calc non Af Amer: 39 mL/min — ABNORMAL LOW (ref >90)
GFR, EST AFRICAN AMERICAN: 45 mL/min — AB (ref >90)
Glucose, Bld: 105 mg/dL — ABNORMAL HIGH (ref 70–99)
Potassium: 3.8 mEq/L (ref 3.7–5.3)
Sodium: 144 mEq/L (ref 137–147)

## 2014-03-01 LAB — CBC
HCT: 28.5 % — ABNORMAL LOW (ref 36.0–46.0)
HCT: 25.1 % — ABNORMAL LOW (ref 36.0–46.0)
Hemoglobin: 9 g/dL — ABNORMAL LOW (ref 12.0–15.0)
Hemoglobin: 7.9 g/dL — ABNORMAL LOW (ref 12.0–15.0)
MCH: 27.1 pg (ref 26.0–34.0)
MCH: 26.9 pg (ref 26.0–34.0)
MCHC: 31.6 g/dL (ref 30.0–36.0)
MCHC: 31.5 g/dL (ref 30.0–36.0)
MCV: 85.8 fL (ref 78.0–100.0)
MCV: 85.4 fL (ref 78.0–100.0)
PLATELETS: 186 10*3/uL (ref 150–400)
PLATELETS: 159 10*3/uL (ref 150–400)
RBC: 3.32 MIL/uL — AB (ref 3.87–5.11)
RBC: 2.94 MIL/uL — ABNORMAL LOW (ref 3.87–5.11)
RDW: 16.2 % — ABNORMAL HIGH (ref 11.5–15.5)
RDW: 16.3 % — ABNORMAL HIGH (ref 11.5–15.5)
WBC: 4.8 10*3/uL (ref 4.0–10.5)
WBC: 5 10*3/uL (ref 4.0–10.5)

## 2014-03-01 LAB — GLUCOSE, CAPILLARY: Glucose-Capillary: 87 mg/dL (ref 70–99)

## 2014-03-01 LAB — MAGNESIUM: MAGNESIUM: 1.7 mg/dL (ref 1.5–2.5)

## 2014-03-01 LAB — PHOSPHORUS: PHOSPHORUS: 3 mg/dL (ref 2.3–4.6)

## 2014-03-01 MED ORDER — SUCRALFATE 1 GM/10ML PO SUSP
1.0000 g | Freq: Three times a day (TID) | ORAL | Status: DC
  Administered 2014-03-01 – 2014-03-04 (×13): 1 g via ORAL
  Filled 2014-03-01 (×16): qty 10

## 2014-03-01 MED ORDER — SERTRALINE HCL 25 MG PO TABS
25.0000 mg | ORAL_TABLET | Freq: Every day | ORAL | Status: DC
  Administered 2014-03-01 – 2014-03-04 (×4): 25 mg via ORAL
  Filled 2014-03-01 (×4): qty 1

## 2014-03-01 MED ORDER — ALBUTEROL SULFATE HFA 108 (90 BASE) MCG/ACT IN AERS: 1.0000 | INHALATION_SPRAY | RESPIRATORY_TRACT | Status: DC | PRN

## 2014-03-01 MED ORDER — ACETAMINOPHEN 650 MG RE SUPP: 650.0000 mg | Freq: Four times a day (QID) | RECTAL | Status: DC | PRN

## 2014-03-01 MED ORDER — ONDANSETRON HCL 4 MG/2ML IJ SOLN: 4.0000 mg | Freq: Three times a day (TID) | INTRAMUSCULAR | Status: DC | PRN

## 2014-03-01 MED ORDER — ISOSORBIDE MONONITRATE ER 30 MG PO TB24
30.0000 mg | ORAL_TABLET | Freq: Every day | ORAL | Status: DC
  Administered 2014-03-01 – 2014-03-04 (×4): 30 mg via ORAL
  Filled 2014-03-01 (×4): qty 1

## 2014-03-01 MED ORDER — SODIUM CHLORIDE 0.9 % IV SOLN
1000.0000 mL | INTRAVENOUS | Status: DC
  Administered 2014-03-01: 1000 mL via INTRAVENOUS

## 2014-03-01 MED ORDER — ONDANSETRON HCL 4 MG PO TABS: 4.0000 mg | ORAL_TABLET | Freq: Four times a day (QID) | ORAL | Status: DC | PRN

## 2014-03-01 MED ORDER — ONDANSETRON HCL 4 MG/2ML IJ SOLN: 4.0000 mg | Freq: Four times a day (QID) | INTRAMUSCULAR | Status: DC | PRN

## 2014-03-01 MED ORDER — ENSURE COMPLETE PO LIQD
237.0000 mL | Freq: Two times a day (BID) | ORAL | Status: DC
  Administered 2014-03-01 – 2014-03-04 (×5): 237 mL via ORAL

## 2014-03-01 MED ORDER — SODIUM CHLORIDE 0.9 % IV SOLN
INTRAVENOUS | Status: AC
  Administered 2014-03-01: 14:00:00 via INTRAVENOUS

## 2014-03-01 MED ORDER — ONDANSETRON HCL 4 MG/2ML IJ SOLN
4.0000 mg | Freq: Once | INTRAMUSCULAR | Status: AC
  Administered 2014-03-01: 4 mg via INTRAVENOUS
  Filled 2014-03-01: qty 2

## 2014-03-01 MED ORDER — LEVOTHYROXINE SODIUM 88 MCG PO TABS
88.0000 ug | ORAL_TABLET | Freq: Every day | ORAL | Status: DC
  Filled 2014-03-01: qty 1

## 2014-03-01 MED ORDER — POLYETHYLENE GLYCOL 3350 17 G PO PACK
17.0000 g | PACK | Freq: Every day | ORAL | Status: DC
  Filled 2014-03-01 (×4): qty 1

## 2014-03-01 MED ORDER — ALBUTEROL SULFATE (2.5 MG/3ML) 0.083% IN NEBU: 2.5000 mg | INHALATION_SOLUTION | RESPIRATORY_TRACT | Status: DC | PRN

## 2014-03-01 MED ORDER — SODIUM CHLORIDE 0.9 % IV SOLN
1000.0000 mL | Freq: Once | INTRAVENOUS | Status: AC
  Administered 2014-03-01: 1000 mL via INTRAVENOUS

## 2014-03-01 MED ORDER — PANTOPRAZOLE SODIUM 40 MG IV SOLR
40.0000 mg | Freq: Two times a day (BID) | INTRAVENOUS | Status: DC
  Administered 2014-03-01 – 2014-03-03 (×5): 40 mg via INTRAVENOUS
  Filled 2014-03-01 (×6): qty 40

## 2014-03-01 MED ORDER — LEVOTHYROXINE SODIUM 88 MCG PO TABS
88.0000 ug | ORAL_TABLET | Freq: Every day | ORAL | Status: DC
  Administered 2014-03-02 – 2014-03-03 (×2): 88 ug via ORAL
  Filled 2014-03-01 (×4): qty 1

## 2014-03-01 MED ORDER — ACETAMINOPHEN 325 MG PO TABS: 650.0000 mg | ORAL_TABLET | Freq: Four times a day (QID) | ORAL | Status: DC | PRN

## 2014-03-01 NOTE — H&P (Signed)
[facility] History and Physical  [name] JSE:[id number] DOB: March 17, 1917 DOA: 03/01/2014  Referring physician: ER physician PCP: Cathlean Cower, MD   Chief Complaint: GI bleed  HPI:  Pt is 80 yo female with HTN, dementia, diverticular bleed, presented to [facility] ED with main concern of sudden onset of BRBPR that she noted morning prior to this admission. This has been associated with nausea and no vomiting, no fevers, chills, diarrhea, no specific other abd or urinary concerns. She has had similar events in the past that resulted in transfusion of 8 U PRBC. Pt denies chest pain or shortness of breath.  In ED, pt noted to be hemodynamically stable. [facility] asked to admit for evaluation and management.  Assessment & Plan    Active Problems:   Lower GI bleed - admit for further evaluation - Hg at 9 on admission, no bleeding at this time - no indication for transfusion at this time - monitor CBC closely and transfuse as indicated - continue protonix   Acute renal failure - likely pre renal from blood loss and poor oral intake - provide IVF gentle hydration, pt very frail -repeat BMP in AM   Severe malnutrition - advance diet as pt able to tolerate    DVT prophylaxis:  Radiological Exams on Admission: No results found.  EKG: pending  Code Status: Full Family Communication: No family at bedside Disposition Plan: Admit for further evaluation  Leisa Lenz, MD  [facility] Pager [phone]  Review of Systems:  Unable to obtain due to dementia    Past Medical History  Diagnosis Date  . Internal hemorrhoid   . Diverticulosis of colon (without mention of hemorrhage)   . Herpes zoster   . Glaucoma   . Lumbar back pain   . Hypertension   . Cardiomyopathy   . Hypothyroid   . Myocardial infarction   . History of blood transfusion     "related to diverticulitis" (12/02/2013)  . GERD (gastroesophageal reflux disease)   . Arthritis     "joints" (12/02/2013)  .  Depression   . Anxiety    Past Surgical History  Procedure Laterality Date  . Thyroidectomy    . Appendectomy    . Tubal ligation    . Colonoscopy  07/19/2012    Procedure: COLONOSCOPY;  Surgeon: Ladene Artist, MD,FACG;  Location: [facility];  Service: Endoscopy;  Laterality: N/A;  . Cataract extraction w/ intraocular lens  implant, bilateral Bilateral   . Glaucoma surgery Right    Social History:  reports that she has never smoked. She has never used smokeless tobacco. She reports that she does not drink alcohol or use illicit drugs.  No Known Allergies  Family History: History reviewed. No pertinent family history.   Prior to Admission medications   Medication Sig Start Date End Date Taking? Authorizing Provider  albuterol (PROVENTIL HFA;VENTOLIN HFA) 108 (90 BASE) MCG/ACT inhaler Inhale 1-2 puffs into the lungs every 4 (four) hours as needed for wheezing or shortness of breath.   Yes Historical Provider, MD  aspirin EC 81 MG tablet Take 81 mg by mouth every Monday, Wednesday, and Friday. 08/29/12  Yes Neena Rhymes, MD  feeding supplement, ENSURE COMPLETE, (ENSURE COMPLETE) LIQD Take 237 mLs by mouth 2 (two) times daily between meals. Strawberry. 09/10/12  Yes Neena Rhymes, MD  furosemide (LASIX) 20 MG tablet Take 1 tablet (20 mg total) by mouth daily as needed for edema. 01/13/14  Yes Biagio Borg, MD  isosorbide mononitrate (IMDUR)  30 MG 24 hr tablet Take 30 mg by mouth daily.   Yes Historical Provider, MD  levothyroxine (SYNTHROID, LEVOTHROID) 88 MCG tablet TAKE 1 TABLET BY MOUTH ONCE A DAY   Yes Neena Rhymes, MD  lisinopril (PRINIVIL,ZESTRIL) 10 MG tablet Take 1 tablet (10 mg total) by mouth daily. 12/03/13  Yes Bobby Rumpf York, PA-C  nitroGLYCERIN (NITROSTAT) 0.4 MG SL tablet Place 0.4 mg under the tongue every 5 (five) minutes as needed. Chest pains 02/06/12  Yes Neena Rhymes, MD  pantoprazole (PROTONIX) 40 MG tablet Take 40 mg by mouth daily.   Yes Historical  Provider, MD  polyethylene glycol (MIRALAX / GLYCOLAX) packet Take 17 g by mouth daily. On Monday Wednesday and Friday.For constipation. 09/26/12  Yes Neena Rhymes, MD  sertraline (ZOLOFT) 25 MG tablet Take 25 mg by mouth daily.   Yes Historical Provider, MD  sucralfate (CARAFATE) 1 GM/10ML suspension Take 10 mLs (1 g total) by mouth 4 (four) times daily -  with meals and at bedtime. 12/10/13  Yes Neena Rhymes, MD   Physical Exam: Filed Vitals:   03/01/14 0908 03/01/14 0930 03/01/14 0945 03/01/14 1030  BP: 170/80 166/67 167/58 154/67  Pulse: 63 58 49 68  Temp: 98.8 F (37.1 C)     TempSrc: Oral     Resp: 18 23 26 18   SpO2: 98% 99% 100% 97%    Physical Exam  Constitutional: Appears frail and cachectic  HENT: Normocephalic. No tonsillar erythema or exudates Eyes: Conjunctivae and EOM are normal. PERRLA, no scleral icterus.  Neck: Normal ROM. Neck supple. No JVD. No tracheal deviation. No thyromegaly.  CVS: RRR, no gallops, no carotid bruit.  Pulmonary: Effort and breath sounds normal, no stridor, rhonchi, wheezes, rales.  Abdominal: Soft. BS +,  no distension, tenderness, rebound or guarding.  Musculoskeletal: Normal range of motion. No edema and no tenderness.  Lymphadenopathy: No lymphadenopathy noted, cervical, inguinal. Neuro: Alert. Normal reflexes, muscle tone coordination. No focal neurologic deficits. Skin: Skin is warm and dry. No rash noted. Not diaphoretic. No erythema. No pallor.  Psychiatric: Unable to assess due to dementia   Labs on Admission:  Basic Metabolic Panel:  Recent Labs Lab 03/01/14 0920  NA 144  K 3.8  CL 104  CO2 27  GLUCOSE 105*  BUN 28*  CREATININE 1.17*  CALCIUM 9.4   Liver Function Tests: No results found for this basename: AST, ALT, ALKPHOS, BILITOT, PROT, ALBUMIN,  in the last 168 hours No results found for this basename: LIPASE, AMYLASE,  in the last 168 hours No results found for this basename: AMMONIA,  in the last 168  hours CBC:  Recent Labs Lab 03/01/14 0920  WBC 4.8  HGB 9.0*  HCT 28.5*  MCV 85.8  PLT 186   Cardiac Enzymes: No results found for this basename: CKTOTAL, CKMB, CKMBINDEX, TROPONINI,  in the last 168 hours BNP: No components found with this basename: POCBNP,  CBG: No results found for this basename: GLUCAP,  in the last 168 hours  If 7PM-7AM, please contact night-coverage [email or web address] Password [email or web address] 03/01/2014, 10:56 AM

## 2014-03-01 NOTE — ED Notes (Addendum)
Pt and daughter reports pt was complaining of abdominal pain starting last night. Pt hx of diverticulosis and internal hemorrhoid. Pt daughter has picture and pt had bowel movement with lots of blood. Started this morning. Pt DNR and daughter brought form. Pt did not give pain score but moans.

## 2014-03-01 NOTE — ED Notes (Signed)
Attempted to call report - nurse not ready.

## 2014-03-01 NOTE — Progress Notes (Signed)
Pt had a bloody bowel movement with one medium sized clot. Daughter at bedside said this is the second bloody stool  since this morning.

## 2014-03-01 NOTE — ED Provider Notes (Addendum)
CSN: [id number]     Arrival date & time 03/01/14  0855 History   First MD Initiated Contact with Patient 03/01/14 0902     Chief Complaint  Patient presents with  . Rectal Bleeding     HPI Patient has a history of lower GI bleed and diverticular bleed.  She presents emergency department without bright red blood as from her rectum this morning as well as a bloody stool.  She reports nausea without vomiting.  She denies lower abdominal discomfort or pain.  Several years ago she required an admission that resulted and 8 units of blood transfusion secondary to diverticular hemorrhage.  Symptoms are mild in severity at this time.  She's only had one bloody bowel movement this morning.   Past Medical History  Diagnosis Date  . Internal hemorrhoid   . Diverticulosis of colon (without mention of hemorrhage)   . Herpes zoster   . Glaucoma   . Lumbar back pain   . Hypertension   . Cardiomyopathy   . Hypothyroid   . Myocardial infarction   . History of blood transfusion     "related to diverticulitis" (12/02/2013)  . GERD (gastroesophageal reflux disease)   . Arthritis     "joints" (12/02/2013)  . Depression   . Anxiety    Past Surgical History  Procedure Laterality Date  . Thyroidectomy    . Appendectomy    . Tubal ligation    . Colonoscopy  07/19/2012    Procedure: COLONOSCOPY;  Surgeon: Ladene Artist, MD,FACG;  Location: [facility];  Service: Endoscopy;  Laterality: N/A;  . Cataract extraction w/ intraocular lens  implant, bilateral Bilateral   . Glaucoma surgery Right    History reviewed. No pertinent family history. History  Substance Use Topics  . Smoking status: Never Smoker   . Smokeless tobacco: Never Used  . Alcohol Use: No   OB History   Grav Para Term Preterm Abortions TAB SAB Ect Mult Living                 Review of Systems  All other systems reviewed and are negative.     Allergies  Review of patient's allergies indicates no known allergies.  Home  Medications   Prior to Admission medications   Medication Sig Start Date End Date Taking? Authorizing Provider  albuterol (PROVENTIL HFA;VENTOLIN HFA) 108 (90 BASE) MCG/ACT inhaler Inhale 1-2 puffs into the lungs every 4 (four) hours as needed for wheezing or shortness of breath.   Yes Historical Provider, MD  aspirin EC 81 MG tablet Take 81 mg by mouth every Monday, Wednesday, and Friday. 08/29/12  Yes Neena Rhymes, MD  feeding supplement, ENSURE COMPLETE, (ENSURE COMPLETE) LIQD Take 237 mLs by mouth 2 (two) times daily between meals. Strawberry. 09/10/12  Yes Neena Rhymes, MD  furosemide (LASIX) 20 MG tablet Take 1 tablet (20 mg total) by mouth daily as needed for edema. 01/13/14  Yes Biagio Borg, MD  isosorbide mononitrate (IMDUR) 30 MG 24 hr tablet Take 30 mg by mouth daily.   Yes Historical Provider, MD  levothyroxine (SYNTHROID, LEVOTHROID) 88 MCG tablet TAKE 1 TABLET BY MOUTH ONCE A DAY   Yes Neena Rhymes, MD  lisinopril (PRINIVIL,ZESTRIL) 10 MG tablet Take 1 tablet (10 mg total) by mouth daily. 12/03/13  Yes Bobby Rumpf York, PA-C  nitroGLYCERIN (NITROSTAT) 0.4 MG SL tablet Place 0.4 mg under the tongue every 5 (five) minutes as needed. Chest pains 02/06/12  Yes Heinz Knuckles  Norins, MD  pantoprazole (PROTONIX) 40 MG tablet Take 40 mg by mouth daily.   Yes Historical Provider, MD  polyethylene glycol (MIRALAX / GLYCOLAX) packet Take 17 g by mouth daily. On Monday Wednesday and Friday.For constipation. 09/26/12  Yes Neena Rhymes, MD  sertraline (ZOLOFT) 25 MG tablet Take 25 mg by mouth daily.   Yes Historical Provider, MD  sucralfate (CARAFATE) 1 GM/10ML suspension Take 10 mLs (1 g total) by mouth 4 (four) times daily -  with meals and at bedtime. 12/10/13  Yes Neena Rhymes, MD   BP 170/80  Pulse 63  Temp(Src) 98.8 F (37.1 C) (Oral)  Resp 18  SpO2 98% Physical Exam  Nursing note and vitals reviewed. Constitutional: She is oriented to person, place, and time. She appears  well-developed and well-nourished. No distress.  HENT:  Head: Normocephalic and atraumatic.  Eyes: EOM are normal.  Neck: Normal range of motion.  Cardiovascular: Normal rate, regular rhythm and normal heart sounds.   Pulmonary/Chest: Effort normal and breath sounds normal.  Abdominal: Soft. She exhibits no distension. There is no tenderness.  Genitourinary:  Prior red blood noted on rectal exam.  No frank bleeding coming from her anus.  No masses.  No tenderness  Musculoskeletal: Normal range of motion.  Neurological: She is alert and oriented to person, place, and time.  Skin: Skin is warm and dry.  Psychiatric: She has a normal mood and affect. Judgment normal.    ED Course  Procedures (including critical care time) Labs Review Labs Reviewed  CBC - Abnormal; Notable for the following:    RBC 3.32 (*)    Hemoglobin 9.0 (*)    HCT 28.5 (*)    RDW 16.2 (*)    All other components within normal limits  BASIC METABOLIC PANEL - Abnormal; Notable for the following:    Glucose, Bld 105 (*)    BUN 28 (*)    Creatinine, Ser 1.17 (*)    GFR calc non Af Amer 39 (*)    GFR calc Af Amer 45 (*)    All other components within normal limits  TYPE AND SCREEN    Imaging Review No results found.   EKG Interpretation None      MDM   Final diagnoses:  None    Suspect lower GI bleed likely diverticular in nature.  Patient will need admission the hospital for serial CBCs and observation.  Vital signs normal.  Type and screen.    Hoy Morn, MD 03/01/14 [place], MD 03/01/14 1010

## 2014-03-01 NOTE — Progress Notes (Signed)
Agree with the previous RN's assessment. Will continue to monitor patient.Setzer, Toyna Erisman Marie  

## 2014-03-01 NOTE — ED Notes (Addendum)
Pt comes from home where she lives with her family.  Pt's daughter states pt had BM this morning with both bright red and dark red blood.  Pt c/o nausea, but denies pain currently.  Pt's daughter states pt c/o abdominal cramping earlier today.  Pt appears lethargic, but alert.  Pt is mildly confused as to events, but daughter states that has been occurring at home as well.  Pt is hard of hearing. Pt has good peripheral pulses in all 4 extremities.  Lung sounds clear.  Bowel sounds present, but diminished.  Pt's lower inner eye lids look nice and red and tongue and buccal mucosa appear normal.

## 2014-03-02 ENCOUNTER — Encounter (HOSPITAL_COMMUNITY): Payer: Self-pay | Admitting: Physician Assistant

## 2014-03-02 DIAGNOSIS — K921 Melena: Secondary | ICD-10-CM | POA: Diagnosis not present

## 2014-03-02 DIAGNOSIS — I428 Other cardiomyopathies: Secondary | ICD-10-CM

## 2014-03-02 DIAGNOSIS — D62 Acute posthemorrhagic anemia: Secondary | ICD-10-CM

## 2014-03-02 DIAGNOSIS — I10 Essential (primary) hypertension: Secondary | ICD-10-CM

## 2014-03-02 DIAGNOSIS — E039 Hypothyroidism, unspecified: Secondary | ICD-10-CM

## 2014-03-02 DIAGNOSIS — R4182 Altered mental status, unspecified: Secondary | ICD-10-CM

## 2014-03-02 DIAGNOSIS — I059 Rheumatic mitral valve disease, unspecified: Secondary | ICD-10-CM

## 2014-03-02 DIAGNOSIS — K5731 Diverticulosis of large intestine without perforation or abscess with bleeding: Secondary | ICD-10-CM | POA: Diagnosis not present

## 2014-03-02 LAB — COMPREHENSIVE METABOLIC PANEL
ALBUMIN: 2.4 g/dL — AB (ref 3.5–5.2)
ALT: <5 U/L (ref 0–35)
AST: 14 U/L (ref 0–37)
Alkaline Phosphatase: 82 U/L (ref 39–117)
BUN: 27 mg/dL — ABNORMAL HIGH (ref 6–23)
CALCIUM: 8.2 mg/dL — AB (ref 8.4–10.5)
CO2: 25 meq/L (ref 19–32)
CREATININE: 1.04 mg/dL (ref 0.50–1.10)
Chloride: 109 mEq/L (ref 96–112)
GFR calc Af Amer: 52 mL/min — ABNORMAL LOW (ref >90)
GFR, EST NON AFRICAN AMERICAN: 45 mL/min — AB (ref >90)
Glucose, Bld: 90 mg/dL (ref 70–99)
Potassium: 3.8 mEq/L (ref 3.7–5.3)
SODIUM: 143 meq/L (ref 137–147)
TOTAL PROTEIN: 5.2 g/dL — AB (ref 6.0–8.3)
Total Bilirubin: 0.3 mg/dL (ref 0.3–1.2)

## 2014-03-02 LAB — CBC
HCT: 28.7 % — ABNORMAL LOW (ref 36.0–46.0)
HEMATOCRIT: 20.1 % — AB (ref 36.0–46.0)
HEMOGLOBIN: 6.5 g/dL — AB (ref 12.0–15.0)
HEMOGLOBIN: 9.3 g/dL — AB (ref 12.0–15.0)
MCH: 27.7 pg (ref 26.0–34.0)
MCH: 28.2 pg (ref 26.0–34.0)
MCHC: 32.3 g/dL (ref 30.0–36.0)
MCHC: 32.4 g/dL (ref 30.0–36.0)
MCV: 85.5 fL (ref 78.0–100.0)
MCV: 87 fL (ref 78.0–100.0)
Platelets: 127 10*3/uL — ABNORMAL LOW (ref 150–400)
Platelets: 144 10*3/uL — ABNORMAL LOW (ref 150–400)
RBC: 2.35 MIL/uL — AB (ref 3.87–5.11)
RBC: 3.3 MIL/uL — ABNORMAL LOW (ref 3.87–5.11)
RDW: 16.2 % — ABNORMAL HIGH (ref 11.5–15.5)
RDW: 15.4 % (ref 11.5–15.5)
WBC: 4.2 10*3/uL (ref 4.0–10.5)
WBC: 5.8 10*3/uL (ref 4.0–10.5)

## 2014-03-02 LAB — TSH: TSH: 1.92 u[IU]/mL (ref 0.350–4.500)

## 2014-03-02 LAB — GLUCOSE, CAPILLARY: GLUCOSE-CAPILLARY: 79 mg/dL (ref 70–99)

## 2014-03-02 LAB — PREPARE RBC (CROSSMATCH)

## 2014-03-02 MED ORDER — SODIUM CHLORIDE 0.9 % IV SOLN
1000.0000 mL | INTRAVENOUS | Status: DC
  Administered 2014-03-02 – 2014-03-03 (×3): 1000 mL via INTRAVENOUS

## 2014-03-02 NOTE — Progress Notes (Signed)
No stools this shift, VSS, HGB is down to 6.5.  Fredirick Maudlin on call and notified.

## 2014-03-02 NOTE — Progress Notes (Deleted)
Bp 168/73, Hr 76. Pt is asysptomatic. Dr. Sherral Hammers, MD is aware

## 2014-03-02 NOTE — Consult Note (Signed)
[facility] Consult: 2:16 PM 03/02/2014  LOS: 1 day    Referring Provider: Dia Crawford MD, [facility] Primary Care Physician:  Cathlean Cower, MD Primary Gastroenterologist:  Dr. Fuller Plan    Reason for Consultation:  Lower Gi bleed and transfusion requiring anemia.    HPI: [name] is a 80 y.o. female.  Hx dementia.  Cardiomyopathy.  Esophageal dysmotility and Zenkers diverticulum on 07/2012 esophagram.  Hx diverticular bleeds (8 unit bleed in 06/2011). Colonoscopy in 07/2012 for abnormal CT and hematochezia revealed sigmoid tics, internal hemorrhoids, rectal ulcer (?secondary to enema or stercoral?) 4 day admission in 07/2013 for recurrent LGIB Hgb 9.6 to 8.9 and did not require transfusion.  Last inpt admission 3/17 - 12/03/13 with ARF, dehydration and HCTZ discontinued. Anemia (HGB 9.1 but 9.5 is baseline) and FOBT + stool noted. No overt GI bleeding.  Has never had nuc med RBC scan   Admitted 6/14 with sudden, acute hematochezia and weakness.  Began in AM 6/14.  The previous day she had c/oand family observed bloated abdomen.  Nausea complaint (has this without emesis several times per week) was treated with the scheduled dose of her QID Carafate.  The in AM she had blood liquid accompanying brown formed stool (I saw photo documentation on dtr's cell phone).  Other stools were darker but bloody.  2 so far today, first bloody, next darker/maroon.  No longer distended or nauseated and tolerating solid food at lunch today.  Hgb 9.0, drop to 6.5 today.  Platelets low to 127 today. Coags not obtained.  One PRBC transfused this AM starting 1015.       Past Medical History  Diagnosis Date  . Internal hemorrhoid 07/2012  . Diverticulosis of colon (without mention of hemorrhage) 07/2012  . Herpes zoster   . Glaucoma    . Lumbar back pain   . Hypertension   . Cardiomyopathy   . Hypothyroid   . Myocardial infarction   . History of blood transfusion     "related to diverticulitis" (12/02/2013)  . GERD (gastroesophageal reflux disease)   . Arthritis     "joints" (12/02/2013)  . Depression   . Anxiety   . Adenomatous colon polyp 07/2012    sessile cecal polyp.  no high grade dysplasia.   Marland Kitchen Hyponatremia 07/2012    Na as low as 114.   . Protein calorie malnutrition 07/2012    BMI then: 16.5  . Zenker diverticulum 07/2012    this and esophageal dysmotility on esophagram.     Past Surgical History  Procedure Laterality Date  . Thyroidectomy    . Appendectomy  2005    ruptured appendix  . Tubal ligation    . Colonoscopy  07/19/2012    Procedure: COLONOSCOPY;  Surgeon: Ladene Artist, MD,FACG;  Location: [facility];  Service: Endoscopy;  Laterality: N/A;  . Cataract extraction w/ intraocular lens  implant, bilateral Bilateral   . Glaucoma surgery Right     Prior to Admission medications   Medication Sig Start Date End Date Taking? Authorizing Provider  albuterol (PROVENTIL  HFA;VENTOLIN HFA) 108 (90 BASE) MCG/ACT inhaler Inhale 1-2 puffs into the lungs every 4 (four) hours as needed for wheezing or shortness of breath.   Yes Historical Provider, MD  aspirin EC 81 MG tablet Take 81 mg by mouth every Monday, Wednesday, and Friday. 08/29/12  Yes Neena Rhymes, MD  feeding supplement, ENSURE COMPLETE, (ENSURE COMPLETE) LIQD Take 237 mLs by mouth 2 (two) times daily between meals. Strawberry. 09/10/12  Yes Neena Rhymes, MD  furosemide (LASIX) 20 MG tablet Take 1 tablet (20 mg total) by mouth daily as needed for edema. 01/13/14  Yes Biagio Borg, MD  isosorbide mononitrate (IMDUR) 30 MG 24 hr tablet Take 30 mg by mouth daily.   Yes Historical Provider, MD  levothyroxine (SYNTHROID, LEVOTHROID) 88 MCG tablet TAKE 1 TABLET BY MOUTH ONCE A DAY   Yes Neena Rhymes, MD  lisinopril (PRINIVIL,ZESTRIL)  10 MG tablet Take 1 tablet (10 mg total) by mouth daily. 12/03/13  Yes Bobby Rumpf York, PA-C  nitroGLYCERIN (NITROSTAT) 0.4 MG SL tablet Place 0.4 mg under the tongue every 5 (five) minutes as needed. Chest pains 02/06/12  Yes Neena Rhymes, MD  pantoprazole (PROTONIX) 40 MG tablet Take 40 mg by mouth daily.   Yes Historical Provider, MD  polyethylene glycol (MIRALAX / GLYCOLAX) packet Take 17 g by mouth daily. On Monday Wednesday and Friday.For constipation. 09/26/12  Yes Neena Rhymes, MD  sertraline (ZOLOFT) 25 MG tablet Take 25 mg by mouth daily.   Yes Historical Provider, MD  sucralfate (CARAFATE) 1 GM/10ML suspension Take 10 mLs (1 g total) by mouth 4 (four) times daily -  with meals and at bedtime. 12/10/13  Yes Neena Rhymes, MD    Scheduled Meds: . feeding supplement (ENSURE COMPLETE)  237 mL Oral BID BM  . isosorbide mononitrate  30 mg Oral Daily  . levothyroxine  88 mcg Oral QAC breakfast  . pantoprazole (PROTONIX) IV  40 mg Intravenous Q12H  . polyethylene glycol  17 g Oral Daily  . sertraline  25 mg Oral Daily  . sucralfate  1 g Oral TID WC & HS   Infusions: . sodium chloride 1,000 mL (03/02/14 1414)   PRN Meds: acetaminophen, acetaminophen, albuterol, ondansetron (ZOFRAN) IV, ondansetron   Allergies as of 03/01/2014  . (No Known Allergies)    History reviewed. No pertinent family history.  History   Social History  . Marital Status: Widowed    Spouse Name: N/A    Number of Children: N/A  . Years of Education: N/A   Occupational History  . Not on file.   Social History Main Topics  . Smoking status: Never Smoker   . Smokeless tobacco: Never Used  . Alcohol Use: No  . Drug Use: No  . Sexual Activity: No   Other Topics Concern  . Not on file   Social History Narrative   Lives alone but family stay with her at night. She remains independent. She has a DNR established but does want medical care.    REVIEW OF SYSTEMS: Constitutional:  No sign weight  loss in recent months   ENT:  No nose bleeds Pulm:  No dyspnea or cough CV:  No palpitations, no LE edema.  GU:  No hematuria, no frequency GI:  Per HPI Heme:  Per HPI   Transfusions:  Per HPI Neuro:  No headaches, no peripheral tingling or numbness Derm:  No itching, no rash or sores.  Endocrine:  No sweats or  chills.  No polyuria or dysuria Immunization:  No records in Epic.  Travel:  None beyond local counties in last few months.    PHYSICAL EXAM: Vital signs in last 24 hours: Filed Vitals:   03/02/14 1245  BP: 168/73  Pulse: 76  Temp: 98.5 F (36.9 C)  Resp: 16   Wt Readings from Last 3 Encounters:  03/02/14 45.677 kg (100 lb 11.2 oz)  01/13/14 43.659 kg (96 lb 4 oz)  12/02/13 41.6 kg (91 lb 11.4 oz)   General: frail, alert, pleasant AAF who is comfortable Head:  No asymmetry or swelling  Eyes:  No icterus.  + pallor Ears:  Not grossly HOH  Nose:  No congestion Mouth:  Dry tongue, no mucosal lesions.   Neck:  No mass, no JVD Lungs:  Clear bil though overall reduced.  Quiet, unlabored resps. Heart: irregular heart rate. Ectopy in backround NSR on EKG.  No mrg Abdomen:  Soft, NT, ND.  No mass or HSM.   Rectal: black/maroon stool smells of melena.  FOBT +.  No mass   Musc/Skeltl: muscular atrophy globally Extremities:  No LE edema  Neurologic:  No tremor.  Oriented to hospital, self.  Not to year.  No tremor.  No gross limb weakness Skin:  No rash or sores Tattoos:  none Nodes:  No cervical adenopathy.    Psych:  Pleasant, cooperative, laconic.    Intake/Output from previous day: 06/14 0701 - 06/15 0700 In: 2126.3 [P.O.:720; I.V.:1406.3] Out: 1000 [Urine:1000] Intake/Output this shift: Total I/O In: 357.9 [P.O.:60; Blood:297.9] Out: 100 [Urine:100]  LAB RESULTS:  Recent Labs  03/01/14 0920 03/01/14 1830 03/02/14 0435  WBC 4.8 5.0 4.2  HGB 9.0* 7.9* 6.5*  HCT 28.5* 25.1* 20.1*  PLT 186 159 127*   BMET Lab Results  Component Value Date   NA 143  03/02/2014   NA 144 03/01/2014   NA 142 12/10/2013   K 3.8 03/02/2014   K 3.8 03/01/2014   K 4.1 12/10/2013   CL 109 03/02/2014   CL 104 03/01/2014   CL 106 12/10/2013   CO2 25 03/02/2014   CO2 27 03/01/2014   CO2 28 12/10/2013   GLUCOSE 90 03/02/2014   GLUCOSE 105* 03/01/2014   GLUCOSE 99 12/10/2013   BUN 27* 03/02/2014   BUN 28* 03/01/2014   BUN 25* 12/10/2013   CREATININE 1.04 03/02/2014   CREATININE 1.17* 03/01/2014   CREATININE 1.1 12/10/2013   CALCIUM 8.2* 03/02/2014   CALCIUM 9.4 03/01/2014   CALCIUM 9.5 12/10/2013   LFT  Recent Labs  03/02/14 0435  PROT 5.2*  ALBUMIN 2.4*  AST 14  ALT <5  ALKPHOS 82  BILITOT 0.3    RADIOLOGY STUDIES: No results found.  ENDOSCOPIC STUDIES: 07/2012  Colonoscopy ,  Dr Fuller Plan  INDICATIONS:hematochezia and an abnormal CT of colon COLON FINDINGS: A sessile polyp measuring 5 mm in size was found at  the cecum. A polypectomy was performed with a cold snare. The  resection was complete and the polyp tissue was completely  retrieved. Mild diverticulosis was noted in the sigmoid colon. A  medium sized, benign appearing, nonbleeding ulcer was found in the  rectum, possibly from enema just prior to colonosocpy or stercoral  ulcer. The colon was otherwise normal. There was no blood in the  colon. There was no diverticulosis, inflammation, polyps or cancers  unless previously stated. Retroflexed views revealed small  internal hemorrhoids. The time to cecum=4 minutes 00 seconds.  Withdrawal time=15 minutes 0  seconds. The scope was withdrawn and  the procedure completed.  COMPLICATIONS: There were no complications.  ENDOSCOPIC IMPRESSION:  1. Sessile polyp measuring at the cecum; polypectomy performed  with a cold snare.  Pathology: tubular adenoma without HGD.  2. Mild diverticulosis was noted in the sigmoid colon  3. Medium sized ulcer in the rectum, possibly from enema or  stercoral 4. Small internal hemorrhoids RECOMMENDATIONS:  1. Await pathology  results  2. Given your age, you will not need another colonoscopy for colon  cancer screening or polyp surveillance. These types of tests  usually stop around the age 30.      IMPRESSION:   *  GI bleed.  Stool currently dark and could represent old LGI source or UGI source BUN is elevated a bit over normal BUN elevation.   *  ABL anemia.  Just completed one of one PRBCs  *  Thrombocytonia.  Elevated D Dimer but normal coags.   *  Frail health.     PLAN:     *  Per Dr Carlean Purl.  Do not think current bleeding is aggressive enough to warrant tagged RBC scan.    Azucena Freed  03/02/2014, 2:16 PM Pager: [phone]  [facility] Attending  I have also seen and assessed the patient and agree with the above note. Elderly (very) with painless GI bleed - most likely diverticular.  Plan supportive care at this point. Discussed w/ family.  Gatha Mayer, MD, [facility] [phone] (pager) 03/02/2014 5:14 PM

## 2014-03-02 NOTE — Progress Notes (Signed)
  Echocardiogram 2D Echocardiogram has been performed.  Diamond Nickel 03/02/2014, 3:14 PM

## 2014-03-02 NOTE — Progress Notes (Signed)
INITIAL NUTRITION ASSESSMENT  DOCUMENTATION CODES Per approved criteria  -Underweight   INTERVENTION: -Recommend Ensure Complete po BID, each supplement provides 350 kcal and 13 grams of protein -Will continue to monitor  NUTRITION DIAGNOSIS: Inadequate oral intake related to decreased appetite as evidenced by family report.   Goal: Pt to meet >/= 90% of their estimated nutrition needs    Monitor:  Pt to meet >/= 90% of their estimated nutrition needs    Reason for Assessment: Underweight BMI  80 y.o. female  Admitting Dx: <principal problem not specified>  ASSESSMENT: Pt is 80 yo female with HTN, dementia, diverticular bleed, presented to [facility] ED with main concern of sudden onset of BRBPR that she noted morning prior to this admission  -Family reported pt with one week poor PO intake. Consumed 25% of meals even with family encouragement and feeding assistance. MD noted pt with lower GI bleed, which has likely contributed to nausea and decreased appetite -Ate well on 6/14 (100% of meals); however only consumed 25% of breakfast this AM -Family confirmed pt consumes 2 Ensure/day. Supplement has been ordered from previous admit -Denied any unintentional wt loss. Weight has appeared to trend up 9 lbs in past 3 months per previous medical records -Phos/K/Mg WNL  Height: Ht Readings from Last 1 Encounters:  03/01/14 5\' 5"  (1.651 m)    Weight: Wt Readings from Last 1 Encounters:  03/02/14 100 lb 11.2 oz (45.677 kg)    Ideal Body Weight: 125 lbs  % Ideal Body Weight: 80%  Wt Readings from Last 10 Encounters:  03/02/14 100 lb 11.2 oz (45.677 kg)  01/13/14 96 lb 4 oz (43.659 kg)  12/02/13 91 lb 11.4 oz (41.6 kg)  08/21/13 90 lb (40.824 kg)  08/10/13 90 lb 11.2 oz (41.141 kg)  06/02/13 93 lb (42.185 kg)  02/25/13 95 lb (43.092 kg)  09/10/12 84 lb 11.2 oz (38.42 kg)  08/21/12 94 lb 1.6 oz (42.683 kg)  08/01/12 80 lb 1.3 oz (36.324 kg)    Usual Body Weight:  approximately 95 lbs per med records  % Usual Body Weight: 99%  BMI:  Body mass index is 16.76 kg/(m^2). Underweight  Estimated Nutritional Needs: Kcal: 1350-1550 Protein: 55-65 gram Fluid: </=1350 ml/daily  Skin: WDL  Diet Order: General  EDUCATION NEEDS: -No education needs identified at this time   Intake/Output Summary (Last 24 hours) at 03/02/14 1343 Last data filed at 03/02/14 1338  Gross per 24 hour  Intake 2484.17 ml  Output    750 ml  Net 1734.17 ml    Last BM: 6/14  Labs:   Recent Labs Lab 03/01/14 0920 03/01/14 0930 03/02/14 0435  NA 144  --  143  K 3.8  --  3.8  CL 104  --  109  CO2 27  --  25  BUN 28*  --  27*  CREATININE 1.17*  --  1.04  CALCIUM 9.4  --  8.2*  MG  --  1.7  --   PHOS  --  3.0  --   GLUCOSE 105*  --  90    CBG (last 3)   Recent Labs  03/01/14 1228 03/02/14 0734  GLUCAP 87 79    Scheduled Meds: . feeding supplement (ENSURE COMPLETE)  237 mL Oral BID BM  . isosorbide mononitrate  30 mg Oral Daily  . levothyroxine  88 mcg Oral QAC breakfast  . pantoprazole (PROTONIX) IV  40 mg Intravenous Q12H  . polyethylene glycol  17 g Oral  Daily  . sertraline  25 mg Oral Daily  . sucralfate  1 g Oral TID WC & HS    Continuous Infusions: . sodium chloride      Past Medical History  Diagnosis Date  . Internal hemorrhoid 07/2012  . Diverticulosis of colon (without mention of hemorrhage) 07/2012  . Herpes zoster   . Glaucoma   . Lumbar back pain   . Hypertension   . Cardiomyopathy   . Hypothyroid   . Myocardial infarction   . History of blood transfusion     "related to diverticulitis" (12/02/2013)  . GERD (gastroesophageal reflux disease)   . Arthritis     "joints" (12/02/2013)  . Depression   . Anxiety   . Adenomatous colon polyp 07/2012    sessile cecal polyp.  no high grade dysplasia.   Marland Kitchen Hyponatremia 07/2012    Na as low as 114.   . Protein calorie malnutrition 07/2012    BMI then: 16.5  . Zenker diverticulum  07/2012    this and esophageal dysmotility on esophagram.     Past Surgical History  Procedure Laterality Date  . Thyroidectomy    . Appendectomy  2005    ruptured appendix  . Tubal ligation    . Colonoscopy  07/19/2012    Procedure: COLONOSCOPY;  Surgeon: Ladene Artist, MD,FACG;  Location: [facility];  Service: Endoscopy;  Laterality: N/A;  . Cataract extraction w/ intraocular lens  implant, bilateral Bilateral   . Glaucoma surgery Right     Atlee Abide MS RD LDN Clinical Dietitian [phone]

## 2014-03-02 NOTE — Progress Notes (Signed)
Bp 168/73, Hr 76. Pt is asymptomatic.  Dr. Sherral Hammers, MD is aware

## 2014-03-02 NOTE — Progress Notes (Signed)
[place] [facility] TEAM 1 - Stepdown/ICU TEAM Progress Note  [name] TGY:[id number] DOB: 07-02-1917 DOA: 03/01/2014 PCP: Cathlean Cower, MD  Admit HPI / Brief Narrative: 80 yo BF PMHx previous GI bleed diverticular bleed,( requiring 8 units PRBC), HTN, dementia,  presented to [facility] ED with main concern of sudden onset of BRBPR that she noted morning prior to this admission. This has been associated with nausea and no vomiting, no fevers, chills, diarrhea, no specific other abd or urinary concerns. She has had similar events in the past that resulted in transfusion of 8 U PRBC. Pt denies chest pain or shortness of breath.      HPI/Subjective: 6/15 patient pleasantly confused, admits to some fatigue, negative CP, negative SOB. Continued bright red blood per rectum with BM.  Assessment/Plan: Lower GI bleed /anemia associated with acute blood loss - Hg at 9 on admission, no bleeding at this time  -continue protonix  -Patient's hemoglobin down to 6.5 this a.m. will transfuse one unit PRBC -History of cardiomyopathy will use transfusion goal as keep hemoglobin> 8 -Check hemoglobin 1500, if <8 transfused another unit PRBC  -Consulted GI, colonoscopy?  Acute renal failure  - likely pre renal from blood loss and poor oral intake  - Continue gentle IVF, will decrease to was normal saline 18ml/hr; pt very frail  -Patient's creatinine has normalized, however her BUN/Cr ratio still consistent with a prerenal picture.   HTN -Patient currently hypertensive however given her blood loss will not attend to lower at this time.  Hypothyroidism -Continue Synthroid 80 mcg daily  Cardiomyopathy -Patient with diagnosis of cardiomyopathy however on able to locate echocardiogram within EPIC. Obtain echocardiogram  Dementia -Per patient's daughter at baseline Severe malnutrition  - advance diet as pt able to tolerate     Code Status: FULL Family Communication: no family present at time of  exam Disposition Plan: Per GI   Consultants: [facility] pending  Procedure/Significant Events: NA   Culture NA  Antibiotics: NA  DVT prophylaxis: SCD   Devices NA   LINES / TUBES:  6/14 20ga right antecubital    Continuous Infusions: . sodium chloride      Objective: VITAL SIGNS: Temp: 98 F (36.7 C) (06/15 1145) Temp src: Oral (06/15 1145) BP: 150/68 mmHg (06/15 1145) Pulse Rate: 54 (06/15 1145) SPO2; FIO2:   Intake/Output Summary (Last 24 hours) at 03/02/14 1241 Last data filed at 03/02/14 1030  Gross per 24 hour  Intake 2186.25 ml  Output    900 ml  Net 1286.25 ml     Exam: General: A./O. x3 (does not know when), NAD, No acute respiratory distress Lungs: Clear to auscultation bilaterally without wheezes or crackles Cardiovascular: Regular rate and rhythm without murmur gallop or rub normal S1 and S2 Abdomen: Tender to palpation RLQ/LLQ/LUQ, nondistended, soft, bowel sounds positive, no rebound, no ascites, no appreciable mass Extremities: No significant cyanosis, clubbing, or edema bilateral lower extremities  Data Reviewed: Basic Metabolic Panel:  Recent Labs Lab 03/01/14 0920 03/01/14 0930 03/02/14 0435  NA 144  --  143  K 3.8  --  3.8  CL 104  --  109  CO2 27  --  25  GLUCOSE 105*  --  90  BUN 28*  --  27*  CREATININE 1.17*  --  1.04  CALCIUM 9.4  --  8.2*  MG  --  1.7  --   PHOS  --  3.0  --    Liver Function Tests:  Recent Labs Lab  03/02/14 0435  AST 14  ALT <5  ALKPHOS 82  BILITOT 0.3  PROT 5.2*  ALBUMIN 2.4*   No results found for this basename: LIPASE, AMYLASE,  in the last 168 hours No results found for this basename: AMMONIA,  in the last 168 hours CBC:  Recent Labs Lab 03/01/14 0920 03/01/14 1830 03/02/14 0435  WBC 4.8 5.0 4.2  HGB 9.0* 7.9* 6.5*  HCT 28.5* 25.1* 20.1*  MCV 85.8 85.4 85.5  PLT 186 159 127*   Cardiac Enzymes: No results found for this basename: CKTOTAL, CKMB, CKMBINDEX, TROPONINI,   in the last 168 hours BNP (last 3 results)  Recent Labs  12/02/13 1852  PROBNP 205.4   CBG:  Recent Labs Lab 03/01/14 1228 03/02/14 0734  GLUCAP 87 79    No results found for this or any previous visit (from the past 240 hour(s)).   Studies:  Recent x-ray studies have been reviewed in detail by the Attending Physician  Scheduled Meds:  Scheduled Meds: . feeding supplement (ENSURE COMPLETE)  237 mL Oral BID BM  . isosorbide mononitrate  30 mg Oral Daily  . levothyroxine  88 mcg Oral QAC breakfast  . pantoprazole (PROTONIX) IV  40 mg Intravenous Q12H  . polyethylene glycol  17 g Oral Daily  . sertraline  25 mg Oral Daily  . sucralfate  1 g Oral TID WC & HS    Time spent on care of this patient: 40 mins   Allie Bossier , MD   [facility] Office  [phone] Pager [phone]  On-Call/Text Page:      [email or web address]      password [email or web address]  If 7PM-7AM, please contact night-coverage [email or web address] Password [email or web address] 03/02/2014, 12:41 PM   LOS: 1 day

## 2014-03-02 NOTE — Progress Notes (Addendum)
Care of pt assumed at this time.  Pt's family member at bedside.  Pt assessed, agree with previously charted assessment.  No complaints or concerns at this time.  Will monitor.  Coolidge Breeze, RN 03/02/2014

## 2014-03-03 DIAGNOSIS — K5730 Diverticulosis of large intestine without perforation or abscess without bleeding: Secondary | ICD-10-CM

## 2014-03-03 LAB — TYPE AND SCREEN
ABO/RH(D): O POS
ANTIBODY SCREEN: NEGATIVE
Unit division: 0

## 2014-03-03 LAB — CBC
HEMATOCRIT: 33 % — AB (ref 36.0–46.0)
Hemoglobin: 10.8 g/dL — ABNORMAL LOW (ref 12.0–15.0)
MCH: 27.8 pg (ref 26.0–34.0)
MCHC: 32.7 g/dL (ref 30.0–36.0)
MCV: 84.8 fL (ref 78.0–100.0)
Platelets: 170 10*3/uL (ref 150–400)
RBC: 3.89 MIL/uL (ref 3.87–5.11)
RDW: 15.5 % (ref 11.5–15.5)
WBC: 8.4 10*3/uL (ref 4.0–10.5)

## 2014-03-03 LAB — GLUCOSE, CAPILLARY: GLUCOSE-CAPILLARY: 103 mg/dL — AB (ref 70–99)

## 2014-03-03 MED ORDER — HYDRALAZINE HCL 20 MG/ML IJ SOLN
5.0000 mg | Freq: Four times a day (QID) | INTRAMUSCULAR | Status: DC | PRN
  Administered 2014-03-03 (×2): 5 mg via INTRAVENOUS
  Filled 2014-03-03 (×3): qty 0.25

## 2014-03-03 MED ORDER — PANTOPRAZOLE SODIUM 40 MG PO TBEC
40.0000 mg | DELAYED_RELEASE_TABLET | Freq: Two times a day (BID) | ORAL | Status: DC
  Administered 2014-03-03 – 2014-03-04 (×3): 40 mg via ORAL
  Filled 2014-03-03 (×4): qty 1

## 2014-03-03 MED ORDER — LISINOPRIL 10 MG PO TABS
10.0000 mg | ORAL_TABLET | Freq: Every day | ORAL | Status: DC
  Administered 2014-03-03 – 2014-03-04 (×2): 10 mg via ORAL
  Filled 2014-03-03 (×2): qty 1

## 2014-03-03 NOTE — Progress Notes (Signed)
Pt had small soft formed dark stool.

## 2014-03-03 NOTE — Progress Notes (Signed)
Called [name] at [phone] and left voicemail to request call back.  [facility] [phone]

## 2014-03-03 NOTE — Progress Notes (Signed)
Met with daughter Anila Bojarski at bedside. Reports patient has an aide that is with her Monday-Friday several hours a day. Also states patient's son comes to stay with patient when aide is not there. Discussed and offer [facility] Care Management. Daughter states they have pretty good support. States a Chiropractor call frequently and patient has good support with the [facility] aide who assists patient. Consents signed for [facility]. However, daughter states she does not think they will need [facility] Care Management. Agreeable to post hospital discharge call to see if she thinks [facility] is needed. Confirmed Judy's number as [phone]. She works during the day and gets off around 3pm. Left [place] Management packet at bedside. Made inpatient RNCM aware. [facility] [phone]

## 2014-03-03 NOTE — Progress Notes (Signed)
Came to see patient to offer and discuss [place] Management services. Daughter at bedside reports her sister, Noell Lorensen at [phone] should be called to discuss. Left brochure and contact number at bedside for patient's daughter, Bethena Roys. Will attempt to reach [place] on phone to offer services.  Marthenia Rolling, MSN- [facility] [phone]

## 2014-03-03 NOTE — Progress Notes (Signed)
Patient had very frequent urination through the night.  Greater than 11 times over the 12 hour shift.  Pt had two episodes of small bloody clots from rectum as well.  BP was increased to 184/81, notified on call and new order for prn Hydralazine was received.  Will continue to monitor patient.

## 2014-03-03 NOTE — Progress Notes (Signed)
     [facility] Progress Note  Subjective:  Per nursing staff, she did not have any BM this morning, but had some blood on the TP.  Objective:  Vital signs in last 24 hours: Temp:  [98 F (36.7 C)-98.5 F (36.9 C)] 98 F (36.7 C) (06/16 0409) Pulse Rate:  [54-76] 64 (06/16 0755) Resp:  [14-20] 16 (06/16 0409) BP: (146-184)/(59-83) 146/59 mmHg (06/16 0755) SpO2:  [100 %] 100 % (06/16 0409) Weight:  [99 lb 11.2 oz (45.224 kg)] 99 lb 11.2 oz (45.224 kg) (06/16 0409) Last BM Date: 03/02/14 General:  Frail in NAD. Heart:  Regular rate and rhythm Pulm:  CTAB.  No W/R/R. Abdomen:  Soft, non-distended.  BS present.  Non-tender. Extremities:  Without edema. Neurologic:  Alert and  oriented x4; grossly normal neurologically. Psych:  Alert and cooperative. Normal mood and affect.  Intake/Output from previous day: 06/15 0701 - 06/16 0700 In: 2394.6 [P.O.:420; I.V.:1676.7; Blood:297.9] Out: 3000 [Urine:3000] Intake/Output this shift: Total I/O In: 60 [P.O.:60] Out: 200 [Urine:200]  Lab Results:  Recent Labs  03/01/14 1830 03/02/14 0435 03/02/14 1527  WBC 5.0 4.2 5.8  HGB 7.9* 6.5* 9.3*  HCT 25.1* 20.1* 28.7*  PLT 159 127* 144*   BMET  Recent Labs  03/01/14 0920 03/02/14 0435  NA 144 143  K 3.8 3.8  CL 104 109  CO2 27 25  GLUCOSE 105* 90  BUN 28* 27*  CREATININE 1.17* 1.04  CALCIUM 9.4 8.2*   LFT  Recent Labs  03/02/14 0435  PROT 5.2*  ALBUMIN 2.4*  AST 14  ALT <5  ALKPHOS 82  BILITOT 0.3   Assessment / Plan: *GI bleed:  Most likely diverticular. *ABL anemia:  S/p one unit PRBC's with good response in Hgb. *Frail health  *Plan is for conservative/supportive care.  Monitor Hgb.   LOS: 2 days   ZEHR, JESSICA D.  03/03/2014, 9:22 AM  Pager number [phone]   [place] Attending  I have also seen and assessed the patient and agree with the above note. CBC ordered  As best we can tell the bleeding is stopping. Note that elevated  BP is associated with rupture of the arteries within diverticula and bleeding. Better control of BP might help though could be difficult to achieve.  Gatha Mayer, MD, [facility] [phone] (pager) 03/03/2014 11:59 AM

## 2014-03-04 DIAGNOSIS — I10 Essential (primary) hypertension: Secondary | ICD-10-CM

## 2014-03-04 LAB — GLUCOSE, CAPILLARY: GLUCOSE-CAPILLARY: 117 mg/dL — AB (ref 70–99)

## 2014-03-04 MED ORDER — LISINOPRIL 20 MG PO TABS: 20.0000 mg | ORAL_TABLET | Freq: Every day | ORAL | Status: DC

## 2014-03-04 MED ORDER — AMLODIPINE BESYLATE 5 MG PO TABS: 5.0000 mg | ORAL_TABLET | Freq: Every day | ORAL | Status: DC

## 2014-03-04 MED ORDER — METOPROLOL SUCCINATE ER 25 MG PO TB24
25.0000 mg | ORAL_TABLET | Freq: Every day | ORAL | Status: DC
  Administered 2014-03-04: 25 mg via ORAL
  Filled 2014-03-04: qty 1

## 2014-03-04 NOTE — Progress Notes (Signed)
Pt discharged to home.  Vitals stable.  Denies pain.  Discharged instructions discussed with daughter.  Verbalized understanding.  Symptoms of GI bleed discussed.  Daughter verbalized understanding.  Pt has all belongings.  Hearing Aid at home.  No other questions or concerns.  Iantha Fallen RN 8:22 PM 03/04/2014

## 2014-03-04 NOTE — Progress Notes (Signed)
Pt had a small soft formed blood tinged dark stool, no clots noted.

## 2014-03-04 NOTE — Progress Notes (Signed)
     [facility] Progress Note  Subjective:  Had a small formed blood-tinged stool this AM but no clots noted per nursing staff.  Objective:  Vital signs in last 24 hours: Temp:  [97.3 F (36.3 C)-98.5 F (36.9 C)] 98.5 F (36.9 C) (06/17 0524) Pulse Rate:  [52-85] 52 (06/17 0524) Resp:  [16-18] 16 (06/17 0524) BP: (149-175)/(81-99) 166/99 mmHg (06/17 0524) SpO2:  [100 %] 100 % (06/17 0524) Weight:  [104 lb 8 oz (47.4 kg)] 104 lb 8 oz (47.4 kg) (06/17 0300) Last BM Date: 03/04/14 General:  Alert, frail, in NAD Heart:  Bradycardic. Pulm:  CTAB.  No W/R/R. Abdomen:  Soft, non-distended. Normal bowel sounds.  Non-tender. Extremities:  Without edema. Neurologic:  Alert and  oriented x4;  grossly normal neurologically. Psych:  Alert and cooperative. Normal mood and affect.  Lab Results:  Recent Labs  03/02/14 0435 03/02/14 1527 03/03/14 1213  WBC 4.2 5.8 8.4  HGB 6.5* 9.3* 10.8*  HCT 20.1* 28.7* 33.0*  PLT 127* 144* 170   BMET  Recent Labs  03/02/14 0435  NA 143  K 3.8  CL 109  CO2 25  GLUCOSE 90  BUN 27*  CREATININE 1.04  CALCIUM 8.2*   LFT  Recent Labs  03/02/14 0435  PROT 5.2*  ALBUMIN 2.4*  AST 14  ALT <5  ALKPHOS 82  BILITOT 0.3   Assessment / Plan: *GI bleed: Most likely diverticular.  Bleeding slowing significantly. *ABL anemia: S/p one unit PRBC's with good response in Hgb.  *Frail health   **Plan is for conservative/supportive care. Monitor Hgb.   LOS: 3 days   ZEHR, JESSICA D.  03/04/2014, 11:10 AM  Pager number [phone]  [place] Attending  I have also seen and assessed the patient and agree with the above note. Seems like she is nearing maximal hospital benefit.  Spoke to daughter and BPmeds were changed last admit - was changed from lisinopril/HCTZ to lisinopril and then prn furosemide.  If she could tolerate going back on lisinopril/HCTZ ( do not see why not) that may control BP better and reduce risk of  diverticular bleed (uncontrolled BP may lead to rupture of arteries in divertcula).  We will sign off - call back if ?  Gatha Mayer, MD, [facility] [phone] (pager) 03/04/2014 4:18 PM

## 2014-03-04 NOTE — Discharge Summary (Addendum)
Physician Discharge Summary  [name] TKZ:[id number] DOB: 27-Nov-1916 DOA: 03/01/2014  PCP: Cathlean Cower, MD  Admit date: 03/01/2014 Discharge date: 03/04/2014  Time spent: 40 minutes  Recommendations for Outpatient Follow-up:  1. Discharge home with outpatient PCP follow up in one week. Followup hemoglobin during outpatient visit. Aspirin has been discontinued upon discharge due to Lower GI bleed  Discharge Diagnoses:  principle Problems:   Acute lower GI bleeding  Active problem   HYPOTHYROIDISM   HYPERTENSION, uncontrolled   CARDIOMYOPATHY   DIVERTICULOSIS, COLON   Anemia associated with acute blood loss   AKI (acute kidney injury)   Dementia     Discharge Condition: Fair  Diet recommendation: Cardiac  Filed Weights   03/02/14 0520 03/03/14 0409 03/04/14 0300  Weight: 45.677 kg (100 lb 11.2 oz) 45.224 kg (99 lb 11.2 oz) 47.4 kg (104 lb 8 oz)    History of present illness:  80 yo female with previous GI bleed diverticular bleed,( requiring 8 units PRBC), HTN, dementia, presented to [facility] ED with main concern of sudden onset of BRBPR that she noted morning prior to this admission. This has been associated with nausea and no vomiting, no fevers, chills, diarrhea, no specific other abd or urinary concerns. She has had similar events in the past that resulted in transfusion .Marland Kitchen Pt denies chest pain or shortness of breath.    Hospital Course:  Lower GI bleed with anemia associated with acute blood loss Hemoglobin on admission of 9 without active bleeding. Hemoglobin dropped to 6.5 on 6/15. Transfuse with 1 unit PRBC given cardiomyopathy . No further rectal bleed and hemoglobin has not improved to 10.8. -Discontinue aspirin until outpatient followup. -Appreciate GIs recommendation. Plan on supportive care. -Patient is clinically stable and can be discharged home with outpatient PCP followup.  Uncontrolled hypertension Will increase her lisinopril dose. Continue Imdur. We'll  add low-dose amlodipine. Patient was previously on HCTZ/lisinopril and HCTZ was discontinued due to dehydration. Needs optimal blood pressure control to reduce further diverticular bleed.  Cardiomyopathy Euvolemic. EF of 45-50%.  Malnutrition Continue supplement  Hypothyroidism Continue Synthroid   AKI Mild on admission likely secondary to hypovolemia and GI bleed. Resolved  Procedures:  None  Consultations:  GI  Diet: Regular  CODE STATUS : DO NOT RESUSCITATE  Family communication: Discussed with daughter at bedside  Discharge Exam: Filed Vitals:   03/04/14 1627  BP: 147/59  Pulse: 80  Temp: 98.2 F (36.8 C)  Resp: 20    General: Elderly female in no acute distress HEENT: Pallor present, moist oral mucosa Chest: Clear to auscultation bilaterally, no added sounds CVS: Normal S1 and S2, no murmurs rub or gallop Abdomen: Soft, nontender, nondistended, bowel sounds present Extremities: Warm, no edema CNS: Alert and awake, oriented x 1-2   Discharge Instructions You were cared for by a hospitalist during your hospital stay. If you have any questions about your discharge medications or the care you received while you were in the hospital after you are discharged, you can call the unit and asked to speak with the hospitalist on call if the hospitalist that took care of you is not available. Once you are discharged, your primary care physician will handle any further medical issues. Please note that NO REFILLS for any discharge medications will be authorized once you are discharged, as it is imperative that you return to your primary care physician (or establish a relationship with a primary care physician if you do not have one) for your aftercare needs so  that they can reassess your need for medications and monitor your lab values.     Medication List    STOP taking these medications       aspirin EC 81 MG tablet      TAKE these medications       albuterol  108 (90 BASE) MCG/ACT inhaler  Commonly known as:  PROVENTIL HFA;VENTOLIN HFA  Inhale 1-2 puffs into the lungs every 4 (four) hours as needed for wheezing or shortness of breath.     amLODipine 5 MG tablet  Commonly known as:  NORVASC  Take 1 tablet (5 mg total) by mouth daily.     feeding supplement (ENSURE COMPLETE) Liqd  Take 237 mLs by mouth 2 (two) times daily between meals. Strawberry.     furosemide 20 MG tablet  Commonly known as:  LASIX  Take 1 tablet (20 mg total) by mouth daily as needed for edema.     isosorbide mononitrate 30 MG 24 hr tablet  Commonly known as:  IMDUR  Take 30 mg by mouth daily.     levothyroxine 88 MCG tablet  Commonly known as:  SYNTHROID, LEVOTHROID  TAKE 1 TABLET BY MOUTH ONCE A DAY     lisinopril 20 MG tablet  Commonly known as:  PRINIVIL,ZESTRIL  Take 1 tablet (20 mg total) by mouth daily.  Start taking on:  03/05/2014     nitroGLYCERIN 0.4 MG SL tablet  Commonly known as:  NITROSTAT  Place 0.4 mg under the tongue every 5 (five) minutes as needed. Chest pains     pantoprazole 40 MG tablet  Commonly known as:  PROTONIX  Take 40 mg by mouth daily.     polyethylene glycol packet  Commonly known as:  MIRALAX / GLYCOLAX  Take 17 g by mouth daily. On Monday Wednesday and Friday.For constipation.     sertraline 25 MG tablet  Commonly known as:  ZOLOFT  Take 25 mg by mouth daily.     sucralfate 1 GM/10ML suspension  Commonly known as:  CARAFATE  Take 10 mLs (1 g total) by mouth 4 (four) times daily -  with meals and at bedtime.       No Known Allergies     Follow-up Information   Follow up with Cathlean Cower, MD. Schedule an appointment as soon as possible for a visit in 1 week.   Specialties:  Internal Medicine, Radiology   Contact information:   [address] [place] [address] [phone]        The results of significant diagnostics from this hospitalization (including imaging, microbiology, ancillary and  laboratory) are listed below for reference.    Significant Diagnostic Studies: No results found.  Microbiology: No results found for this or any previous visit (from the past 240 hour(s)).   Labs: Basic Metabolic Panel:  Recent Labs Lab 03/01/14 0920 03/01/14 0930 03/02/14 0435  NA 144  --  143  K 3.8  --  3.8  CL 104  --  109  CO2 27  --  25  GLUCOSE 105*  --  90  BUN 28*  --  27*  CREATININE 1.17*  --  1.04  CALCIUM 9.4  --  8.2*  MG  --  1.7  --   PHOS  --  3.0  --    Liver Function Tests:  Recent Labs Lab 03/02/14 0435  AST 14  ALT <5  ALKPHOS 82  BILITOT 0.3  PROT 5.2*  ALBUMIN  2.4*   No results found for this basename: LIPASE, AMYLASE,  in the last 168 hours No results found for this basename: AMMONIA,  in the last 168 hours CBC:  Recent Labs Lab 03/01/14 0920 03/01/14 1830 03/02/14 0435 03/02/14 1527 03/03/14 1213  WBC 4.8 5.0 4.2 5.8 8.4  HGB 9.0* 7.9* 6.5* 9.3* 10.8*  HCT 28.5* 25.1* 20.1* 28.7* 33.0*  MCV 85.8 85.4 85.5 87.0 84.8  PLT 186 159 127* 144* 170   Cardiac Enzymes: No results found for this basename: CKTOTAL, CKMB, CKMBINDEX, TROPONINI,  in the last 168 hours BNP: BNP (last 3 results)  Recent Labs  12/02/13 1852  PROBNP 205.4   CBG:  Recent Labs Lab 03/01/14 1228 03/02/14 0734 03/03/14 0736 03/04/14 0753  GLUCAP 87 79 103* 117*       Signed:  DHUNGEL, NISHANT  [facility] 03/04/2014, 6:10 PM

## 2014-03-10 ENCOUNTER — Other Ambulatory Visit (INDEPENDENT_AMBULATORY_CARE_PROVIDER_SITE_OTHER): Payer: Commercial Managed Care - HMO

## 2014-03-10 ENCOUNTER — Ambulatory Visit (INDEPENDENT_AMBULATORY_CARE_PROVIDER_SITE_OTHER)
Admission: RE | Admit: 2014-03-10 | Discharge: 2014-03-10 | Disposition: A | Discharge status: Home / Self Care | Payer: Commercial Managed Care - HMO | Source: Ambulatory Visit | Attending: Internal Medicine | Admitting: Internal Medicine

## 2014-03-10 ENCOUNTER — Encounter: Payer: Self-pay | Admitting: Internal Medicine

## 2014-03-10 ENCOUNTER — Ambulatory Visit (INDEPENDENT_AMBULATORY_CARE_PROVIDER_SITE_OTHER): Payer: Commercial Managed Care - HMO | Admitting: Internal Medicine

## 2014-03-10 VITALS — BP 130/80 | HR 59 | Temp 97.7°F | Wt 93.8 lb

## 2014-03-10 DIAGNOSIS — K922 Gastrointestinal hemorrhage, unspecified: Secondary | ICD-10-CM

## 2014-03-10 DIAGNOSIS — G4700 Insomnia, unspecified: Secondary | ICD-10-CM

## 2014-03-10 DIAGNOSIS — R079 Chest pain, unspecified: Secondary | ICD-10-CM

## 2014-03-10 DIAGNOSIS — J3089 Other allergic rhinitis: Secondary | ICD-10-CM

## 2014-03-10 DIAGNOSIS — I10 Essential (primary) hypertension: Secondary | ICD-10-CM

## 2014-03-10 LAB — CBC WITH DIFFERENTIAL/PLATELET
Basophils Absolute: 0 10*3/uL (ref 0.0–0.1)
Basophils Relative: 0.2 % (ref 0.0–3.0)
EOS PCT: 0.7 % (ref 0.0–5.0)
Eosinophils Absolute: 0 10*3/uL (ref 0.0–0.7)
HEMATOCRIT: 28.3 % — AB (ref 36.0–46.0)
Hemoglobin: 9.1 g/dL — ABNORMAL LOW (ref 12.0–15.0)
LYMPHS ABS: 0.8 10*3/uL (ref 0.7–4.0)
LYMPHS PCT: 13.6 % (ref 12.0–46.0)
MCHC: 32.3 g/dL (ref 30.0–36.0)
MCV: 87.8 fl (ref 78.0–100.0)
MONOS PCT: 6.6 % (ref 3.0–12.0)
Monocytes Absolute: 0.4 10*3/uL (ref 0.1–1.0)
NEUTROS ABS: 4.7 10*3/uL (ref 1.4–7.7)
Neutrophils Relative %: 78.9 % — ABNORMAL HIGH (ref 43.0–77.0)
Platelets: 190 10*3/uL (ref 150.0–400.0)
RBC: 3.22 Mil/uL — AB (ref 3.87–5.11)
RDW: 16.6 % — AB (ref 11.5–15.5)
WBC: 5.9 10*3/uL (ref 4.0–10.5)

## 2014-03-10 MED ORDER — MOMETASONE FUROATE 50 MCG/ACT NA SUSP: 2.0000 | Freq: Every day | NASAL | Status: DC

## 2014-03-10 MED ORDER — LISINOPRIL 10 MG PO TABS: 10.0000 mg | ORAL_TABLET | Freq: Every day | ORAL | Status: DC

## 2014-03-10 NOTE — Assessment & Plan Note (Signed)
For OTC nasonex prn,  to f/u any worsening symptoms or concerns

## 2014-03-10 NOTE — Assessment & Plan Note (Addendum)
Ok to resume/cont home meds as prior to recent hospn, no changes at this time  BP Readings from Last 3 Encounters:  03/10/14 130/80  03/04/14 151/96  01/13/14 162/80

## 2014-03-10 NOTE — Progress Notes (Signed)
Pre visit review using our clinic review tool, if applicable. No additional management support is needed unless otherwise documented below in the visit note. 

## 2014-03-10 NOTE — Assessment & Plan Note (Signed)
Resolved by hx, for f/u cbc, ok to stay off asa as risk likely more than benefit

## 2014-03-10 NOTE — Assessment & Plan Note (Signed)
For otc melatonin prn

## 2014-03-10 NOTE — Patient Instructions (Signed)
OK to stop the norvasc, and take only the 10 mg of the lisinopril as you have been doing  Ok to try OTC Melatonin for sleep, as well as Nasonex for allergies  Please continue all other medications as before, and refills have been done if requested.  Please have the pharmacy call with any other refills you may need.  Please continue your efforts at being more active, low cholesterol diet, and weight control.  Please keep your appointments with your specialists as you may have planned  Please go to the XRAY Department in the Basement (go straight as you get off the elevator) for the x-ray testing  Please go to the LAB in the Basement (turn left off the elevator) for the tests to be done today  You will be contacted by phone if any changes need to be made immediately.  Otherwise, you will receive a letter about your results with an explanation, but please check with MyChart first.  Please remember to sign up for MyChart if you have not done so, as this will be important to you in the future with finding out test results, communicating by private email, and scheduling acute appointments online when needed.

## 2014-03-10 NOTE — Assessment & Plan Note (Signed)
?   MSK vs other, declines ecg, for cxr if can stand well enough to do this today

## 2014-03-10 NOTE — Progress Notes (Signed)
Subjective:    Patient ID: [name], female    DOB: 05-17-1917, 80 y.o.   MRN: [id number]  HPI  Here to f/u approx 1 wk post d/c for LGI bleed, transfused 1 u prbc, pt with dementia, hx per Aide (stays with her mon-fri 8-5) and daughter (stays with her all other times); no further BRBPR, off aspirin.  BP meds adjusted in hosp with addit of norvasc 5 qd and incr lisinopril 20 qd (from 10 mg) but daughter did not feel comfortabel with this, kept her on same meds and BP has gradually improved in the past wk.  Does have some insomnia last 2 nights with waking at 2 am, daughter had to sleep in her bed to better monitor, wants to avoid doing this again in future.  Mentions some increased paranoia recently, but confusion seems at baseline.  Does have tender left chest, without rash, red, swelling for unclear reason since d/c but Pt denies other chest pain, increased sob or doe, wheezing, orthopnea, PND, increased LE swelling, palpitations, dizziness or syncope.  Does have several wks ongoing nasal allergy symptoms with clearish congestion, itch and sneezing, without fever, pain, ST, cough, swelling or wheezing. Has not tried OTC meds.  Past Medical History  Diagnosis Date  . Internal hemorrhoid 07/2012  . Diverticulosis of colon (without mention of hemorrhage) 07/2012  . Herpes zoster   . Glaucoma   . Lumbar back pain   . Hypertension   . Cardiomyopathy   . Hypothyroid   . Myocardial infarction   . History of blood transfusion     "related to diverticulitis" (12/02/2013)  . GERD (gastroesophageal reflux disease)   . Arthritis     "joints" (12/02/2013)  . Depression   . Anxiety   . Adenomatous colon polyp 07/2012    sessile cecal polyp.  no high grade dysplasia.   Marland Kitchen Hyponatremia 07/2012    Na as low as 114.   . Protein calorie malnutrition 07/2012    BMI then: 16.5  . Zenker diverticulum 07/2012    this and esophageal dysmotility on esophagram.    Past Surgical History  Procedure  Laterality Date  . Thyroidectomy    . Appendectomy  2005    ruptured appendix  . Tubal ligation    . Colonoscopy  07/19/2012    Procedure: COLONOSCOPY;  Surgeon: Ladene Artist, MD,FACG;  Location: [facility];  Service: Endoscopy;  Laterality: N/A;  . Cataract extraction w/ intraocular lens  implant, bilateral Bilateral   . Glaucoma surgery Right     reports that she has never smoked. She has never used smokeless tobacco. She reports that she does not drink alcohol or use illicit drugs. family history is not on file. No Known Allergies Current Outpatient Prescriptions on File Prior to Visit  Medication Sig Dispense Refill  . albuterol (PROVENTIL HFA;VENTOLIN HFA) 108 (90 BASE) MCG/ACT inhaler Inhale 1-2 puffs into the lungs every 4 (four) hours as needed for wheezing or shortness of breath.      . feeding supplement, ENSURE COMPLETE, (ENSURE COMPLETE) LIQD Take 237 mLs by mouth 2 (two) times daily between meals. Strawberry.      . furosemide (LASIX) 20 MG tablet Take 1 tablet (20 mg total) by mouth daily as needed for edema.  90 tablet  3  . isosorbide mononitrate (IMDUR) 30 MG 24 hr tablet Take 30 mg by mouth daily.      Marland Kitchen levothyroxine (SYNTHROID, LEVOTHROID) 88 MCG tablet TAKE 1 TABLET BY MOUTH  ONCE A DAY  30 tablet  5  . nitroGLYCERIN (NITROSTAT) 0.4 MG SL tablet Place 0.4 mg under the tongue every 5 (five) minutes as needed. Chest pains      . pantoprazole (PROTONIX) 40 MG tablet Take 40 mg by mouth daily.      . polyethylene glycol (MIRALAX / GLYCOLAX) packet Take 17 g by mouth daily. On Monday Wednesday and Friday.For constipation.      . sertraline (ZOLOFT) 25 MG tablet Take 25 mg by mouth daily.      . sucralfate (CARAFATE) 1 GM/10ML suspension Take 10 mLs (1 g total) by mouth 4 (four) times daily -  with meals and at bedtime.  420 mL  3  . [DISCONTINUED] isosorbide dinitrate (ISORDIL) 30 MG tablet Take 30 mg by mouth 3 (three) times daily.        . [DISCONTINUED] ranitidine  (ZANTAC) 150 MG tablet Take 150 mg by mouth 2 (two) times daily.         No current facility-administered medications on file prior to visit.   Review of Systems- per family, pt unable due to dementia  Constitutional: Negative for unusual diaphoresis or other sweats  HENT: Negative for ringing in ear Eyes: Negative for double vision or worsening visual disturbance.  Respiratory: Negative for choking and stridor.   Gastrointestinal: Negative for vomiting or other signifcant bowel change Genitourinary: Negative for hematuria or decreased urine volume.  Musculoskeletal: Negative for other MSK pain or swelling Skin: Negative for color change and worsening wound.  Neurological: Negative for tremors and numbness other than noted  Psychiatric/Behavioral: Negative for decreased concentration or agitation other than above       Objective:   Physical Exam BP 130/80  Pulse 59  Temp(Src) 97.7 F (36.5 C) (Oral)  Wt 93 lb 12 oz (42.525 kg)  SpO2 97% VS noted,  Constitutional: Pt appears well-developed, well-nourished.  HENT: Head: NCAT.  Right Ear: External ear normal.  Left Ear: External ear normal.  Eyes: . Pupils are equal, round, and reactive to light. Conjunctivae and EOM are normal Neck: Normal range of motion. Neck supple.  Cardiovascular: Normal rate and regular rhythm.   Pulmonary/Chest: Effort normal and breath sounds normal. ; has some tenderness to left lateral chest ant axillary line approx t7, no swelling, red Abd:  Soft, NT, ND, + BS Neurological: Pt is alert. Not confused , motor grossly intact Skin: Skin is warm. No rash Psychiatric: Pt behavior is normal. No agitation.    Assessment & Plan:

## 2014-04-02 ENCOUNTER — Other Ambulatory Visit: Payer: Self-pay

## 2014-04-02 MED ORDER — LEVOTHYROXINE SODIUM 88 MCG PO TABS: 88.0000 ug | ORAL_TABLET | Freq: Every day | ORAL | Status: DC

## 2014-04-06 ENCOUNTER — Other Ambulatory Visit: Payer: Self-pay | Admitting: Internal Medicine

## 2014-04-14 ENCOUNTER — Ambulatory Visit: Payer: Commercial Managed Care - HMO | Admitting: Internal Medicine

## 2014-04-21 ENCOUNTER — Other Ambulatory Visit: Payer: Self-pay

## 2014-04-21 MED ORDER — PANTOPRAZOLE SODIUM 40 MG PO TBEC: 40.0000 mg | DELAYED_RELEASE_TABLET | Freq: Every day | ORAL | Status: DC

## 2014-04-29 ENCOUNTER — Telehealth: Payer: Self-pay | Admitting: Internal Medicine

## 2014-04-29 NOTE — Telephone Encounter (Signed)
The Protonix should not be causing any abd pain; so if the abd pain is better with holding the protonix, this would most likely be a coincidence.    I think ok to re-start the protonix, but if no reflux or other pain, it could be held for now , but ok to start for any worsening reflux

## 2014-04-29 NOTE — Telephone Encounter (Signed)
Patients daughter states patient was having stomach issues.  Daughter took patient off of protonix for a week and continued patient on carafate.  Daughter states [name] has not complained about stomach since.  She would like to know if its ok to continue not taking protonix?  Please advise.

## 2014-04-29 NOTE — Telephone Encounter (Signed)
Called the daughter informed of MD instructions.  She did verbalize instructions.

## 2014-05-18 ENCOUNTER — Other Ambulatory Visit: Payer: Self-pay

## 2014-05-18 MED ORDER — ISOSORBIDE MONONITRATE ER 30 MG PO TB24: 30.0000 mg | ORAL_TABLET | Freq: Every day | ORAL | Status: DC

## 2014-05-19 ENCOUNTER — Other Ambulatory Visit: Payer: Self-pay

## 2014-05-19 MED ORDER — ISOSORBIDE MONONITRATE ER 30 MG PO TB24: 30.0000 mg | ORAL_TABLET | Freq: Every day | ORAL | Status: DC

## 2014-05-21 ENCOUNTER — Other Ambulatory Visit: Payer: Self-pay

## 2014-05-21 MED ORDER — POLYETHYLENE GLYCOL 3350 17 G PO PACK: 17.0000 g | PACK | Freq: Every day | ORAL | Status: DC

## 2014-05-28 ENCOUNTER — Other Ambulatory Visit: Payer: Self-pay

## 2014-05-28 MED ORDER — SERTRALINE HCL 25 MG PO TABS: 25.0000 mg | ORAL_TABLET | Freq: Every day | ORAL | Status: DC

## 2014-06-03 ENCOUNTER — Ambulatory Visit (INDEPENDENT_AMBULATORY_CARE_PROVIDER_SITE_OTHER): Payer: Commercial Managed Care - HMO | Admitting: Internal Medicine

## 2014-06-03 ENCOUNTER — Encounter: Payer: Self-pay | Admitting: Internal Medicine

## 2014-06-03 VITALS — BP 148/80 | HR 69 | Temp 97.9°F

## 2014-06-03 DIAGNOSIS — L608 Other nail disorders: Secondary | ICD-10-CM

## 2014-06-03 DIAGNOSIS — B369 Superficial mycosis, unspecified: Secondary | ICD-10-CM

## 2014-06-03 DIAGNOSIS — L609 Nail disorder, unspecified: Secondary | ICD-10-CM

## 2014-06-03 MED ORDER — KETOCONAZOLE 2 % EX CREA: 1.0000 "application " | TOPICAL_CREAM | Freq: Two times a day (BID) | CUTANEOUS | Status: DC

## 2014-06-03 NOTE — Progress Notes (Signed)
   Subjective:    Patient ID: Evianna Chandran, female    DOB: 1917-06-07, 80 y.o.   MRN: [id number]  HPI   The family and caregivers are very attentive; they noted some localized bleeding in the intertriginous areas 05/31/14 after cleaning these areas. This was associated with some slight tenderness. They also noted some whitish color change of the first and second toe of the right foot and & the first toe of the left foot intertriginously  No associated fever, chills, sweats, or purulent drainage.  She has no other bleeding dyscrasias.    Review of Systems Epistaxis, hemoptysis, hematuria, melena, or rectal bleeding denied.No unexplained weight loss, dysphagia, or abdominal pain reported. No persistently small caliber stools  There has been  no abnormal bruising or bleeding. There is no difficulty stopping bleeding with injury.     Objective:   Physical Exam   Pertinent or positive findings include: She appears younger then her stated age; she appears very frail. She is sitting in a wheelchair. Her thorax sits on her hips. She is completely edentulous. The second heart sound is accentuated. Bunion deformities of the feet are present. She has hammertoe deformities,more right than the left. There is evidence of some pressure  skin change in the intertriginous areas. There is a shallow ulcer without associated purulence. Slight maceration present. Toenails are thickened.  General appearance :adequately nourished; in no distress. Eyes: No conjunctival inflammation or scleral icterus is present. Oral exam: Lips and gums are healthy appearing.There is no oropharyngeal erythema or exudate noted.  Heart:  Normal rate and regular rhythm. S1  normal without gallop, murmur, click, rub or other extra sounds   Lungs:Chest clear to auscultation; no wheezes, rhonchi,rales ,or rubs present.No increased work of breathing.  Abdomen: bowel sounds normal, soft and non-tender without masses, organomegaly  or hernias noted.  No guarding or rebound.  Skin:Warm & dry.  Intact without suspicious lesions or rashes ; no jaundice or tenting Lymphatic: No lymphadenopathy is noted about the head, neck, axilla Equal but slightly decreased pedal pulses.             Assessment & Plan:  #1 intertriginous shallow ulcer with slight maceration, possible fungal component See AVS

## 2014-06-03 NOTE — Progress Notes (Signed)
Pre visit review using our clinic review tool, if applicable. No additional management support is needed unless otherwise documented below in the visit note. 

## 2014-06-03 NOTE — Patient Instructions (Signed)
Dip gauze in  sterile saline and applied to the open lesion twice a day. Apply Nizoral after wet to dry dressing  . Do not use antibiotic ointment. The saline can be purchased at the drugstore or you can make your own .Boil cup of salt in a gallon of water. Store mixture  in a clean container.Report Warning  signs as discussed (red streaks, pus, fever, increasing pain).  The Podiatry referral will be scheduled and you'll be notified of the time.

## 2014-06-11 ENCOUNTER — Encounter: Payer: Self-pay | Admitting: Podiatry

## 2014-06-11 ENCOUNTER — Ambulatory Visit (INDEPENDENT_AMBULATORY_CARE_PROVIDER_SITE_OTHER): Payer: Commercial Managed Care - HMO | Admitting: Podiatry

## 2014-06-11 VITALS — BP 171/92 | HR 61 | Resp 15 | Ht 60.0 in | Wt 98.0 lb

## 2014-06-11 DIAGNOSIS — B351 Tinea unguium: Secondary | ICD-10-CM

## 2014-06-11 DIAGNOSIS — M79673 Pain in unspecified foot: Secondary | ICD-10-CM

## 2014-06-11 DIAGNOSIS — L84 Corns and callosities: Secondary | ICD-10-CM

## 2014-06-11 DIAGNOSIS — M79609 Pain in unspecified limb: Secondary | ICD-10-CM

## 2014-06-11 DIAGNOSIS — M2040 Other hammer toe(s) (acquired), unspecified foot: Secondary | ICD-10-CM

## 2014-06-11 NOTE — Progress Notes (Signed)
   Subjective:    Patient ID: [name], female    DOB: 09-Nov-1916, 80 y.o.   MRN: [id number]  HPI Comments: Pt's dtr, Melvyn Novas states pt's niece trimmed the pt's corns on the B/L 1st toes causing bleeding and pain.  Pt was seen 06/03/2014 by DR. Hopper, he ordered sterile saline dressing to the wound areas daily and Nizoral after each wet to dry dressing until seen at the podiatrist.  Debridement of 10 toenails.  Foot Pain      Review of Systems  All other systems reviewed and are negative.      Objective:   Physical Exam        Assessment & Plan:

## 2014-06-12 NOTE — Progress Notes (Signed)
Subjective:     Patient ID: [name], female   DOB: 03/30/1917, 80 y.o.   MRN: [id number]  Foot Pain   patient presents with caregiver with nail disease of both feet that are painful and lesions between the big toe of the second toe of both feet that they've tried to work on that have not been successful   Review of Systems  All other systems reviewed and are negative.      Objective:   Physical Exam  Nursing note and vitals reviewed. Constitutional: She is oriented to person, place, and time.  Cardiovascular: Intact distal pulses.   Musculoskeletal: Normal range of motion.  Neurological: She is oriented to person, place, and time.  Skin: Skin is warm and dry.   neurovascular status found to be diminished but intact for 80 years old which digits that are well-perfused for her age and structural deformity of the forefoot with large hyperostosis medial aspect first metatarsal left over right and deviation of the hallux against the second toe with elevation of the second toe noted. Keratotic lesions between the hallux and second toe both feet that are painful and nail disease with thickness yellow brittle appearance and pain 1-5 of both feet    Assessment:     Structural deformity between the hallux and second toe creating interdigital keratotic lesion and nail disease 1-5 of both feet that are painful    Plan:     H&P and conditions discussed along with structural deformity. Do not recommend surgical intervention due to advanced age and today debrided nailbeds 1-5 both feet and lesions between the hallux and second toe of both feet and applied cushions to reduce pressure. Reappoint 3 months earlier if any issues should occur      

## 2014-07-02 ENCOUNTER — Other Ambulatory Visit: Payer: Self-pay

## 2014-07-02 MED ORDER — SUCRALFATE 1 GM/10ML PO SUSP: 1.0000 g | Freq: Three times a day (TID) | ORAL | Status: DC

## 2014-08-06 ENCOUNTER — Encounter: Payer: Self-pay | Admitting: Internal Medicine

## 2014-08-06 ENCOUNTER — Ambulatory Visit (INDEPENDENT_AMBULATORY_CARE_PROVIDER_SITE_OTHER): Payer: Commercial Managed Care - HMO | Admitting: Internal Medicine

## 2014-08-06 ENCOUNTER — Telehealth: Payer: Self-pay

## 2014-08-06 ENCOUNTER — Other Ambulatory Visit (INDEPENDENT_AMBULATORY_CARE_PROVIDER_SITE_OTHER): Payer: Commercial Managed Care - HMO

## 2014-08-06 VITALS — BP 138/80 | HR 88 | Temp 98.3°F | Wt 89.0 lb

## 2014-08-06 DIAGNOSIS — R109 Unspecified abdominal pain: Secondary | ICD-10-CM

## 2014-08-06 DIAGNOSIS — N289 Disorder of kidney and ureter, unspecified: Secondary | ICD-10-CM

## 2014-08-06 DIAGNOSIS — R300 Dysuria: Secondary | ICD-10-CM

## 2014-08-06 DIAGNOSIS — I10 Essential (primary) hypertension: Secondary | ICD-10-CM

## 2014-08-06 LAB — URINALYSIS, ROUTINE W REFLEX MICROSCOPIC
BILIRUBIN URINE: NEGATIVE
Hgb urine dipstick: NEGATIVE
KETONES UR: NEGATIVE
Leukocytes, UA: NEGATIVE
NITRITE: NEGATIVE
PH: 7 (ref 5.0–8.0)
RBC / HPF: NONE SEEN (ref 0–?)
SPECIFIC GRAVITY, URINE: 1.01 (ref 1.000–1.030)
Urine Glucose: NEGATIVE
Urobilinogen, UA: 1 (ref 0.0–1.0)

## 2014-08-06 MED ORDER — LEVOFLOXACIN 250 MG PO TABS: 250.0000 mg | ORAL_TABLET | Freq: Every day | ORAL | Status: DC

## 2014-08-06 MED ORDER — CEFTRIAXONE SODIUM 1 G IJ SOLR
1.0000 g | Freq: Once | INTRAMUSCULAR | Status: AC
  Administered 2014-08-06: 1 g via INTRAMUSCULAR

## 2014-08-06 NOTE — Telephone Encounter (Signed)
Order entered and patient informed.

## 2014-08-06 NOTE — Progress Notes (Signed)
Pre visit review using our clinic review tool, if applicable. No additional management support is needed unless otherwise documented below in the visit note. 

## 2014-08-06 NOTE — Assessment & Plan Note (Signed)
Unclear etiology, exam benign except for possible acute on chronic confusion though difficult to quantify, UA pending in lab, unable to give specimen again in office, for rocephin IM, oral antibx course,  to f/u any worsening symptoms or concerns, f/u urine cx

## 2014-08-06 NOTE — Telephone Encounter (Signed)
[place] for ua and culture when able before 530 pm at the [facility] to add lab,  - dysuria, confusion

## 2014-08-06 NOTE — Telephone Encounter (Signed)
Patient is scheduled today at 5 for possible UTI.  Her daughter states she is waking up somewhat confused and [employer] RN suggested to have urine checked.  Ok to put order in for to come early to the lab and have checked.  If ok let me know diagnosis to use.

## 2014-08-06 NOTE — Patient Instructions (Signed)
You had the antibiotic shot today  Please take all new medication as prescribed - the pill antibiotic (start tomorrow)  Please continue all other medications as before, and refills have been done if requested.  Please have the pharmacy call with any other refills you may need.  Please keep your appointments with your specialists as you may have planned  Please go to the LAB in the Basement (turn left off the elevator) for the tests to be done tomorrow  You will be contacted by phone if any changes need to be made immediately.  Otherwise, you will receive a letter about your results with an explanation, but please check with MyChart first.  Please remember to sign up for MyChart if you have not done so, as this will be important to you in the future with finding out test results, communicating by private email, and scheduling acute appointments online when needed.

## 2014-08-06 NOTE — Assessment & Plan Note (Signed)
stable overall by history and exam, recent data reviewed with family, and pt to continue medical treatment as before,  to f/u any worsening symptoms or concerns, for f/u lab in am

## 2014-08-06 NOTE — Progress Notes (Signed)
Subjective:    Patient ID: [name], female    DOB: 12-Apr-1917, 80 y.o.   MRN: [id number]  HPI   Here with acute on chronic confusion, suggested by [facility] nurse to get checked for UTi.  Pt with dementia, unable to give hx, family mentions c/o abd pain recently but details o/w not forthcoming except carafate not apparently helping. No other apparent symptoms. except Pt continues to have some recurring right LBP without change in severity, bowel or bladder change, fever, wt loss,  worsening LE pain/numbness/weakness, or falls. Past Medical History  Diagnosis Date  . Internal hemorrhoid 07/2012  . Diverticulosis of colon (without mention of hemorrhage) 07/2012  . Herpes zoster   . Glaucoma   . Lumbar back pain   . Hypertension   . Cardiomyopathy   . Hypothyroid   . Myocardial infarction   . History of blood transfusion     "related to diverticulitis" (12/02/2013)  . GERD (gastroesophageal reflux disease)   . Arthritis     "joints" (12/02/2013)  . Depression   . Anxiety   . Adenomatous colon polyp 07/2012    sessile cecal polyp.  no high grade dysplasia.   Marland Kitchen Hyponatremia 07/2012    Na as low as 114.   . Protein calorie malnutrition 07/2012    BMI then: 16.5  . Zenker diverticulum 07/2012    this and esophageal dysmotility on esophagram.    Past Surgical History  Procedure Laterality Date  . Thyroidectomy    . Appendectomy  2005    ruptured appendix  . Tubal ligation    . Colonoscopy  07/19/2012    Procedure: COLONOSCOPY;  Surgeon: Ladene Artist, MD,FACG;  Location: [facility];  Service: Endoscopy;  Laterality: N/A;  . Cataract extraction w/ intraocular lens  implant, bilateral Bilateral   . Glaucoma surgery Right     reports that she has never smoked. She has never used smokeless tobacco. She reports that she does not drink alcohol or use illicit drugs. family history is not on file. No Known Allergies Current Outpatient Prescriptions on File Prior to Visit  Medication  Sig Dispense Refill  . albuterol (PROVENTIL HFA;VENTOLIN HFA) 108 (90 BASE) MCG/ACT inhaler Inhale 1-2 puffs into the lungs every 4 (four) hours as needed for wheezing or shortness of breath.    . feeding supplement, ENSURE COMPLETE, (ENSURE COMPLETE) LIQD Take 237 mLs by mouth 2 (two) times daily between meals. Strawberry.    . furosemide (LASIX) 20 MG tablet Take 1 tablet (20 mg total) by mouth daily as needed for edema. 90 tablet 3  . isosorbide mononitrate (IMDUR) 30 MG 24 hr tablet Take 1 tablet (30 mg total) by mouth daily. 30 tablet 11  . ketoconazole (NIZORAL) 2 % cream Apply 1 application topically 2 (two) times daily. 15 g 0  . levothyroxine (SYNTHROID, LEVOTHROID) 88 MCG tablet Take 1 tablet (88 mcg total) by mouth daily before breakfast. 30 tablet 11  . lisinopril (PRINIVIL,ZESTRIL) 10 MG tablet TAKE 1 TABLET EVERY DAY 30 tablet 11  . mometasone (NASONEX) 50 MCG/ACT nasal spray Place 2 sprays into the nose daily. 17 g 2  . nitroGLYCERIN (NITROSTAT) 0.4 MG SL tablet Place 0.4 mg under the tongue every 5 (five) minutes as needed. Chest pains    . pantoprazole (PROTONIX) 40 MG tablet Take 1 tablet (40 mg total) by mouth daily. 90 tablet 3  . polyethylene glycol (MIRALAX / GLYCOLAX) packet Take 17 g by mouth daily. On Monday Wednesday and  Friday.For constipation. 14 each 6  . sertraline (ZOLOFT) 25 MG tablet Take 1 tablet (25 mg total) by mouth daily. 30 tablet 11  . sucralfate (CARAFATE) 1 GM/10ML suspension Take 10 mLs (1 g total) by mouth 4 (four) times daily -  with meals and at bedtime. 420 mL 3  . [DISCONTINUED] isosorbide dinitrate (ISORDIL) 30 MG tablet Take 30 mg by mouth 3 (three) times daily.      . [DISCONTINUED] ranitidine (ZANTAC) 150 MG tablet Take 150 mg by mouth 2 (two) times daily.       No current facility-administered medications on file prior to visit.   Review of Systems Unable due to dementia    Objective:   Physical Exam BP 138/80 mmHg  Pulse 88  Temp(Src)  98.3 F (36.8 C) (Oral)  Wt 89 lb (40.37 kg)  SpO2 95% VS noted, fatigued appaering, o/w NAD Alert, follows simple commands, no apparent specific extremity weakness Constitutional: Pt appears well-developed, well-nourished.  HENT: Head: NCAT.  Right Ear: External ear normal.  Left Ear: External ear normal.  Eyes: . Pupils are equal, round, and reactive to light. Conjunctivae and EOM are normal Neck: Normal range of motion. Neck supple.  Cardiovascular: Normal rate and regular rhythm.   Pulmonary/Chest: Effort normal and breath sounds normal.  Neurological: Pt is alert. Not confused , motor grossly intact Skin: Skin is warm. No rash Psychiatric: Pt behavior is normal. No agitation.     Assessment & Plan:

## 2014-08-06 NOTE — Telephone Encounter (Signed)
Patient informed. 

## 2014-08-06 NOTE — Assessment & Plan Note (Signed)
stable overall by history and exam, recent data reviewed with pt, and pt to continue medical treatment as before,  to f/u any worsening symptoms or concerns BP Readings from Last 3 Encounters:  08/06/14 138/80  06/11/14 171/92  06/03/14 148/80

## 2014-08-07 ENCOUNTER — Other Ambulatory Visit (INDEPENDENT_AMBULATORY_CARE_PROVIDER_SITE_OTHER): Payer: Commercial Managed Care - HMO

## 2014-08-07 DIAGNOSIS — R109 Unspecified abdominal pain: Secondary | ICD-10-CM

## 2014-08-07 LAB — BASIC METABOLIC PANEL
BUN: 24 mg/dL — ABNORMAL HIGH (ref 6–23)
CALCIUM: 9.5 mg/dL (ref 8.4–10.5)
CO2: 30 meq/L (ref 19–32)
CREATININE: 0.9 mg/dL (ref 0.4–1.2)
Chloride: 107 mEq/L (ref 96–112)
GFR: 72.88 mL/min (ref >60.00)
GLUCOSE: 115 mg/dL — AB (ref 70–99)
Potassium: 4 mEq/L (ref 3.5–5.1)
Sodium: 144 mEq/L (ref 135–145)

## 2014-08-07 LAB — CBC WITH DIFFERENTIAL/PLATELET
BASOS ABS: 0 10*3/uL (ref 0.0–0.1)
BASOS PCT: 0.1 % (ref 0.0–3.0)
EOS PCT: 0.8 % (ref 0.0–5.0)
Eosinophils Absolute: 0 10*3/uL (ref 0.0–0.7)
HCT: 33.1 % — ABNORMAL LOW (ref 36.0–46.0)
Hemoglobin: 10.4 g/dL — ABNORMAL LOW (ref 12.0–15.0)
LYMPHS PCT: 24 % (ref 12.0–46.0)
Lymphs Abs: 1.3 10*3/uL (ref 0.7–4.0)
MCHC: 31.5 g/dL (ref 30.0–36.0)
MCV: 86.1 fl (ref 78.0–100.0)
MONOS PCT: 8.4 % (ref 3.0–12.0)
Monocytes Absolute: 0.4 10*3/uL (ref 0.1–1.0)
NEUTROS ABS: 3.5 10*3/uL (ref 1.4–7.7)
Neutrophils Relative %: 66.7 % (ref 43.0–77.0)
Platelets: 183 10*3/uL (ref 150.0–400.0)
RBC: 3.85 Mil/uL — ABNORMAL LOW (ref 3.87–5.11)
RDW: 16.3 % — ABNORMAL HIGH (ref 11.5–15.5)
WBC: 5.3 10*3/uL (ref 4.0–10.5)

## 2014-08-07 LAB — HEPATIC FUNCTION PANEL
ALBUMIN: 3.9 g/dL (ref 3.5–5.2)
ALT: 13 U/L (ref 0–35)
AST: 22 U/L (ref 0–37)
Alkaline Phosphatase: 85 U/L (ref 39–117)
Bilirubin, Direct: 0.1 mg/dL (ref 0.0–0.3)
TOTAL PROTEIN: 7.4 g/dL (ref 6.0–8.3)
Total Bilirubin: 0.7 mg/dL (ref 0.2–1.2)

## 2014-08-07 LAB — SEDIMENTATION RATE: Sed Rate: 45 mm/hr — ABNORMAL HIGH (ref 0–22)

## 2014-08-07 LAB — LIPASE: Lipase: 33 U/L (ref 11.0–59.0)

## 2014-08-08 LAB — URINE CULTURE: Colony Count: 9000

## 2014-08-10 ENCOUNTER — Telehealth: Payer: Self-pay | Admitting: Internal Medicine

## 2014-08-10 NOTE — Telephone Encounter (Signed)
Pt daughter called in and wanted to know if pt had a uti on the 19th?  She needed to verify the culture?       Best number [phone]

## 2014-08-10 NOTE — Telephone Encounter (Signed)
Culture has proven negative, and labs were stable as well (no signficant abnormality)  Has anything gotten better or worse for her; given her exam, I can find no other issue besides her probable worsening dementia at this time to cause change in her behavior

## 2014-08-10 NOTE — Telephone Encounter (Signed)
Please advise 

## 2014-08-11 NOTE — Telephone Encounter (Signed)
Family informed of results and MD instructions.

## 2014-09-14 ENCOUNTER — Ambulatory Visit (INDEPENDENT_AMBULATORY_CARE_PROVIDER_SITE_OTHER): Payer: Commercial Managed Care - HMO | Admitting: Podiatry

## 2014-09-14 DIAGNOSIS — M79673 Pain in unspecified foot: Secondary | ICD-10-CM

## 2014-09-14 DIAGNOSIS — M79675 Pain in left toe(s): Secondary | ICD-10-CM | POA: Diagnosis not present

## 2014-09-14 DIAGNOSIS — M79674 Pain in right toe(s): Secondary | ICD-10-CM | POA: Diagnosis not present

## 2014-09-14 DIAGNOSIS — B351 Tinea unguium: Secondary | ICD-10-CM | POA: Diagnosis not present

## 2014-09-14 NOTE — Progress Notes (Signed)
Subjective:     Patient ID: [name], female   DOB: 1917-01-30, 80 y.o.   MRN: [id number]  Foot Pain   patient presents with caregiver with nail disease of both feet that are painful and lesions between the big toe of the second toe of both feet that they've tried to work on that have not been successful   Review of Systems  All other systems reviewed and are negative.      Objective:   Physical Exam  Nursing note and vitals reviewed. Constitutional: She is oriented to person, place, and time.  Cardiovascular: Intact distal pulses.   Musculoskeletal: Normal range of motion.  Neurological: She is oriented to person, place, and time.  Skin: Skin is warm and dry.   neurovascular status found to be diminished but intact for 79 years old which digits that are well-perfused for her age and structural deformity of the forefoot with large hyperostosis medial aspect first metatarsal left over right and deviation of the hallux against the second toe with elevation of the second toe noted. Keratotic lesions between the hallux and second toe both feet that are painful and nail disease with thickness yellow brittle appearance and pain 1-5 of both feet    Assessment:     Structural deformity between the hallux and second toe creating interdigital keratotic lesion and nail disease 1-5 of both feet that are painful    Plan:     H&P and conditions discussed along with structural deformity. Do not recommend surgical intervention due to advanced age and today debrided nailbeds 1-5 both feet and lesions between the hallux and second toe of both feet and applied cushions to reduce pressure. Reappoint 3 months earlier if any issues should occur

## 2014-09-14 NOTE — Progress Notes (Signed)
Subjective:     Patient ID: [name], female   DOB: Mar 05, 1917, 80 y.o.   MRN: [id number]  HPI patient presents with thick painful nailbeds 1-5 both feet that she cannot cut and are difficult to wear shoe gear with   Review of Systems     Objective:   Physical Exam Neurovascular status diminished but unchanged with thick yellow brittle nailbeds 1-5 both feet    Assessment:     Mycotic nail infection with pain 1-5 both feet    Plan:     Debride painful nailbeds 1-5 both feet with no iatrogenic bleeding noted

## 2014-09-18 DIAGNOSIS — M545 Low back pain: Secondary | ICD-10-CM | POA: Diagnosis not present

## 2014-09-21 ENCOUNTER — Telehealth: Payer: Self-pay | Admitting: Internal Medicine

## 2014-09-21 DIAGNOSIS — M545 Low back pain: Secondary | ICD-10-CM | POA: Diagnosis not present

## 2014-09-21 NOTE — Telephone Encounter (Signed)
Pt daughter called in and wanted to know if Dr Jenny Reichmann could all in something for pt to help sleep.  She has been up for almost 2 whole days?

## 2014-09-22 DIAGNOSIS — M545 Low back pain: Secondary | ICD-10-CM | POA: Diagnosis not present

## 2014-09-22 MED ORDER — MIRTAZAPINE 15 MG PO TABS: 15.0000 mg | ORAL_TABLET | Freq: Every day | ORAL | Status: DC

## 2014-09-22 NOTE — Telephone Encounter (Signed)
HHRN informed 

## 2014-09-22 NOTE — Telephone Encounter (Signed)
Ok to try mirtazipine 15 qhs prn - done  erx

## 2014-09-23 ENCOUNTER — Encounter: Payer: Self-pay | Admitting: Internal Medicine

## 2014-09-23 ENCOUNTER — Ambulatory Visit (INDEPENDENT_AMBULATORY_CARE_PROVIDER_SITE_OTHER): Payer: Commercial Managed Care - HMO | Admitting: Internal Medicine

## 2014-09-23 VITALS — BP 128/80 | HR 55 | Temp 97.5°F

## 2014-09-23 DIAGNOSIS — L89311 Pressure ulcer of right buttock, stage 1: Secondary | ICD-10-CM

## 2014-09-23 DIAGNOSIS — L89301 Pressure ulcer of unspecified buttock, stage 1: Secondary | ICD-10-CM | POA: Insufficient documentation

## 2014-09-23 DIAGNOSIS — F0391 Unspecified dementia with behavioral disturbance: Secondary | ICD-10-CM

## 2014-09-23 DIAGNOSIS — G4700 Insomnia, unspecified: Secondary | ICD-10-CM

## 2014-09-23 DIAGNOSIS — M545 Low back pain: Secondary | ICD-10-CM | POA: Diagnosis not present

## 2014-09-23 DIAGNOSIS — F29 Unspecified psychosis not due to a substance or known physiological condition: Secondary | ICD-10-CM | POA: Diagnosis not present

## 2014-09-23 MED ORDER — DUODERM CGF DRESSING EX MISC: CUTANEOUS | Status: DC

## 2014-09-23 NOTE — Assessment & Plan Note (Signed)
Ok to try melatonin further if helps, and/or benadryl qhs prn, consider remeron 15 qhs if not working as can help with appetite as well

## 2014-09-23 NOTE — Assessment & Plan Note (Signed)
Also for duoderm prn,  to f/u any worsening symptoms or concerns

## 2014-09-23 NOTE — Progress Notes (Signed)
Pre visit review using our clinic review tool, if applicable. No additional management support is needed unless otherwise documented below in the visit note. 

## 2014-09-23 NOTE — Assessment & Plan Note (Signed)
Mild worsening, cont same tx for now,  to f/u any worsening symptoms or concerns

## 2014-09-23 NOTE — Patient Instructions (Addendum)
OK to continue the melatonin OTC as you are doing  You could also take Benadryl 25-50 mg at bedtime as well  If not helping, ok to try the remeron 15 mg at bedtime  If the hallucinations or agitation get worse, we could consider seroquel at bedtime  Please take all new medication as prescribed - the duoderm  Please continue all other medications as before, and refills have been done if requested.  Please have the pharmacy call with any other refills you may need.  Please keep your appointments with your specialists as you may have planned

## 2014-09-23 NOTE — Assessment & Plan Note (Addendum)
Mild , assoc with mild agitation, consider seroquel qhs  Note:  Total time for pt hx, exam, review of record with pt in the room, determination of diagnoses and plan for further eval and tx is > 40 min, with over 50% spent in coordination and counseling of patient

## 2014-09-23 NOTE — Progress Notes (Signed)
Subjective:    Patient ID: [name], female    DOB: 1916-12-19, 81 y.o.   MRN: [id number]  HPI  Here with daughter who gives hx, called 2 days ago with problem sleeping at night,  has not yet tried the remeron but did ok last 2 nights with melatonin.  Has days and nights mixed up some, sees dead persons in her room. Sleeping during the day more lately, and banging the cane on the floor at night. . No fever, Denies urinary symptoms such as dysuria, frequency, urgency, flank pain, hematuria or n/v, fever, chills. Also reddened area to right ischial area, hard to keep her from sitting on it, non ulcer or skin breakdown Past Medical History  Diagnosis Date  . Internal hemorrhoid 07/2012  . Diverticulosis of colon (without mention of hemorrhage) 07/2012  . Herpes zoster   . Glaucoma   . Lumbar back pain   . Hypertension   . Cardiomyopathy   . Hypothyroid   . Myocardial infarction   . History of blood transfusion     "related to diverticulitis" (12/02/2013)  . GERD (gastroesophageal reflux disease)   . Arthritis     "joints" (12/02/2013)  . Depression   . Anxiety   . Adenomatous colon polyp 07/2012    sessile cecal polyp.  no high grade dysplasia.   Marland Kitchen Hyponatremia 07/2012    Na as low as 114.   . Protein calorie malnutrition 07/2012    BMI then: 16.5  . Zenker diverticulum 07/2012    this and esophageal dysmotility on esophagram.    Past Surgical History  Procedure Laterality Date  . Thyroidectomy    . Appendectomy  2005    ruptured appendix  . Tubal ligation    . Colonoscopy  07/19/2012    Procedure: COLONOSCOPY;  Surgeon: Ladene Artist, MD,FACG;  Location: [facility];  Service: Endoscopy;  Laterality: N/A;  . Cataract extraction w/ intraocular lens  implant, bilateral Bilateral   . Glaucoma surgery Right     reports that she has never smoked. She has never used smokeless tobacco. She reports that she does not drink alcohol or use illicit drugs. family history is not  on file. No Known Allergies Current Outpatient Prescriptions on File Prior to Visit  Medication Sig Dispense Refill  . albuterol (PROVENTIL HFA;VENTOLIN HFA) 108 (90 BASE) MCG/ACT inhaler Inhale 1-2 puffs into the lungs every 4 (four) hours as needed for wheezing or shortness of breath.    . feeding supplement, ENSURE COMPLETE, (ENSURE COMPLETE) LIQD Take 237 mLs by mouth 2 (two) times daily between meals. Strawberry.    . furosemide (LASIX) 20 MG tablet Take 1 tablet (20 mg total) by mouth daily as needed for edema. 90 tablet 3  . isosorbide mononitrate (IMDUR) 30 MG 24 hr tablet Take 1 tablet (30 mg total) by mouth daily. 30 tablet 11  . ketoconazole (NIZORAL) 2 % cream Apply 1 application topically 2 (two) times daily. 15 g 0  . levothyroxine (SYNTHROID, LEVOTHROID) 88 MCG tablet Take 1 tablet (88 mcg total) by mouth daily before breakfast. 30 tablet 11  . lisinopril (PRINIVIL,ZESTRIL) 10 MG tablet TAKE 1 TABLET EVERY DAY 30 tablet 11  . mometasone (NASONEX) 50 MCG/ACT nasal spray Place 2 sprays into the nose daily. 17 g 2  . nitroGLYCERIN (NITROSTAT) 0.4 MG SL tablet Place 0.4 mg under the tongue every 5 (five) minutes as needed. Chest pains    . pantoprazole (PROTONIX) 40 MG tablet Take 1 tablet (  40 mg total) by mouth daily. 90 tablet 3  . polyethylene glycol (MIRALAX / GLYCOLAX) packet Take 17 g by mouth daily. On Monday Wednesday and Friday.For constipation. 14 each 6  . sertraline (ZOLOFT) 25 MG tablet Take 1 tablet (25 mg total) by mouth daily. 30 tablet 11  . sucralfate (CARAFATE) 1 GM/10ML suspension Take 10 mLs (1 g total) by mouth 4 (four) times daily -  with meals and at bedtime. 420 mL 3  . mirtazapine (REMERON) 15 MG tablet Take 1 tablet (15 mg total) by mouth at bedtime. (Patient not taking: Reported on 09/23/2014) 30 tablet 2  . [DISCONTINUED] isosorbide dinitrate (ISORDIL) 30 MG tablet Take 30 mg by mouth 3 (three) times daily.      . [DISCONTINUED] ranitidine (ZANTAC) 150 MG  tablet Take 150 mg by mouth 2 (two) times daily.       No current facility-administered medications on file prior to visit.   Review of Systems  Constitutional: Negative for unusual diaphoresis or other sweats  HENT: Negative for ringing in ear Eyes: Negative for double vision or worsening visual disturbance.  Respiratory: Negative for choking and stridor.   Gastrointestinal: Negative for vomiting or other signifcant bowel change Genitourinary: Negative for hematuria or decreased urine volume.  Musculoskeletal: Negative for other MSK pain or swelling Skin: Negative for color change and worsening wound.  Neurological: Negative for tremors and numbness other than noted  Psychiatric/Behavioral: Negative for decreased concentration or agitation other than above       Objective:   Physical Exam BP 128/80 mmHg  Pulse 55  Temp(Src) 97.5 F (36.4 C) (Oral)  SpO2 95% VS noted, sleepy but easily aroused, not ill appearing Constitutional: Pt appears thin  HENT: Head: NCAT.  Right Ear: External ear normal.  Left Ear: External ear normal.  Eyes: . Pupils are equal, round, and reactive to light. Conjunctivae and EOM are normal Neck: Normal range of motion. Neck supple.  Cardiovascular: Normal rate and regular rhythm.   Pulmonary/Chest: Effort normal and breath sounds without rales or wheezing.  Abd:  Soft, NT, ND, + BS, no flank tender Neurological: Pt is alert. Not confused , motor grossly intact Skin: Skin is warm. No rash, mild erythema noted at right ischial tuberosity Psychiatric: Pt behavior is sleepy. Not agitation.     Assessment & Plan:

## 2014-09-24 DIAGNOSIS — M545 Low back pain: Secondary | ICD-10-CM | POA: Diagnosis not present

## 2014-09-25 DIAGNOSIS — M545 Low back pain: Secondary | ICD-10-CM | POA: Diagnosis not present

## 2014-09-28 DIAGNOSIS — M545 Low back pain: Secondary | ICD-10-CM | POA: Diagnosis not present

## 2014-09-29 DIAGNOSIS — M545 Low back pain: Secondary | ICD-10-CM | POA: Diagnosis not present

## 2014-09-30 DIAGNOSIS — M545 Low back pain: Secondary | ICD-10-CM | POA: Diagnosis not present

## 2014-10-01 DIAGNOSIS — M545 Low back pain: Secondary | ICD-10-CM | POA: Diagnosis not present

## 2014-10-02 DIAGNOSIS — M545 Low back pain: Secondary | ICD-10-CM | POA: Diagnosis not present

## 2014-10-05 DIAGNOSIS — M545 Low back pain: Secondary | ICD-10-CM | POA: Diagnosis not present

## 2014-10-06 DIAGNOSIS — M545 Low back pain: Secondary | ICD-10-CM | POA: Diagnosis not present

## 2014-10-07 DIAGNOSIS — M545 Low back pain: Secondary | ICD-10-CM | POA: Diagnosis not present

## 2014-10-08 DIAGNOSIS — M545 Low back pain: Secondary | ICD-10-CM | POA: Diagnosis not present

## 2014-10-09 DIAGNOSIS — M545 Low back pain: Secondary | ICD-10-CM | POA: Diagnosis not present

## 2014-10-12 DIAGNOSIS — M545 Low back pain: Secondary | ICD-10-CM | POA: Diagnosis not present

## 2014-10-13 DIAGNOSIS — M545 Low back pain: Secondary | ICD-10-CM | POA: Diagnosis not present

## 2014-10-14 ENCOUNTER — Telehealth: Payer: Self-pay | Admitting: Internal Medicine

## 2014-10-14 DIAGNOSIS — M545 Low back pain: Secondary | ICD-10-CM | POA: Diagnosis not present

## 2014-10-14 NOTE — Telephone Encounter (Signed)
Needs OV unless having further bleeding, so then to go to ER

## 2014-10-14 NOTE — Telephone Encounter (Signed)
Patient Name: [name]  DOB: [birth year]    Initial Comment Caller states the PT has blood in her stool.    Nurse Assessment  Nurse: Thad Ranger RN, Denise Date/Time (Eastern Time): 10/14/2014 3:46:03 PM  Confirm and document reason for call. If symptomatic, describe symptoms. ---Caller states the PT has blood in her stool. Caller reports the CNA had reported blood in her stool earlier today, and the caller reports she wiped her a few minutes, and did not note any blood. Pt w/o n/v.  Has the patient traveled out of the country within the last 30 days? ---Not Applicable  Does the patient require triage? ---Yes  Related visit to physician within the last 2 weeks? ---No  Does the PT have any chronic conditions? (i.e. diabetes, asthma, etc.) ---Yes  List chronic conditions. ---Dementia, HTN, Thyroid dx     Guidelines    Guideline Title Affirmed Question Affirmed Notes  Rectal Bleeding Age > 50 years    Final Disposition User   See PCP within [place], Therapist, sports, [employer]

## 2014-10-15 DIAGNOSIS — M545 Low back pain: Secondary | ICD-10-CM | POA: Diagnosis not present

## 2014-10-16 DIAGNOSIS — M545 Low back pain: Secondary | ICD-10-CM | POA: Diagnosis not present

## 2014-10-19 DIAGNOSIS — M545 Low back pain: Secondary | ICD-10-CM | POA: Diagnosis not present

## 2014-10-19 NOTE — Telephone Encounter (Signed)
[facility] Patient Name: [name] Gender: Female DOB: 1917/08/21 Age: 81 Y 29 M 8 D Return Phone Number: [phone] (Primary) Address: City/State/Zip: [place] Client [place] [facility] - Client Client Site [place] - Day Physician Cathlean Cower Contact Type Call Call Type Triage / Clinical Caller Name Nelia Shi Relationship To Patient Daughter Appointment Disposition EMR Appointment Scheduled Return Phone Number [phone] (Primary) Chief Complaint Blood In Stool Initial Comment Caller states the PT has blood in her stool. PreDisposition Call Doctor Info pasted into Epic Yes Nurse Assessment Nurse: Thad Ranger, RN, Langley Gauss Date/Time Eilene Ghazi Time): 10/14/2014 3:46:03 PM Confirm and document reason for call. If symptomatic, describe symptoms. ---Caller states the PT has blood in her stool. Caller reports the CNA had reported blood in her stool earlier today, and the caller reports she wiped her a few minutes, and did not note any blood. Pt w/o n/v. Has the patient traveled out of the country within the last 30 days? ---Not Applicable Does the patient require triage? ---Yes Related visit to physician within the last 2 weeks? ---No Does the PT have any chronic conditions? (i.e. diabetes, asthma, etc.) ---Yes List chronic conditions. ---Dementia, HTN, Thyroid dx Guidelines Guideline Title Affirmed Question Affirmed Notes Nurse Date/Time Eilene Ghazi Time) Rectal Bleeding Age > 41 years Thad Ranger, Solicitor 10/14/2014 3:49:37 PM Disp. Time Eilene Ghazi Time) Disposition Final User 10/14/2014 4:20:26 PM Call Completed Thad Ranger, RN, Langley Gauss 10/14/2014 3:52:48 PM See PCP within 2 Weeks Yes Carmon, RN, Yevette Edwards Understands: Yes Disagree/Comply: Comply PLEASE NOTE: All timestamps contained within this report are represented as [place] Standard Time. CONFIDENTIALTY NOTICE: This  fax transmission is intended only for the addressee. It contains information that is legally privileged, confidential or otherwise protected from use or disclosure. If you are not the intended recipient, you are strictly prohibited from reviewing, disclosing, copying using or disseminating any of this information or taking any action in reliance on or regarding this information. If you have received this fax in error, please notify us immediately by telephone so that we can arrange for its return to [place]. Phone: [phone], Toll-Free: [phone], Fax: [phone] Page: 2 of 2 Call Id: [id number] Care Advice Given Per Guideline SEE PCP WITHIN 2 WEEKS: You need an evaluation for this ongoing problem within the next 2 weeks. Call your doctor during regular office hours and make an appointment. REASSURANCE: You have told me that there is only mild bleeding. Often this can be caused by either hemorrhoids or a small tear (fissure) in the skin of the anus (rectal opening). There are several things that you can do to make this better. CALL BACK IF: * Bleeding increases in amount * You become worse. CARE ADVICE given per Rectal Bleeding (Adult) guideline. After Care Instructions Given Call Event Type User Date / Time Description Comments User: Romeo Apple, RN Date/Time Eilene Ghazi Time): 10/14/2014 4:20:07 PM PCG reports she works the rest of the week and will not be able to get her mother to the [facility] untill next week and needs afternoon

## 2014-10-20 ENCOUNTER — Other Ambulatory Visit: Payer: Commercial Managed Care - HMO

## 2014-10-20 ENCOUNTER — Other Ambulatory Visit (INDEPENDENT_AMBULATORY_CARE_PROVIDER_SITE_OTHER): Payer: Commercial Managed Care - HMO

## 2014-10-20 ENCOUNTER — Ambulatory Visit (INDEPENDENT_AMBULATORY_CARE_PROVIDER_SITE_OTHER): Payer: Commercial Managed Care - HMO | Admitting: Internal Medicine

## 2014-10-20 ENCOUNTER — Encounter: Payer: Self-pay | Admitting: Internal Medicine

## 2014-10-20 VITALS — BP 122/78 | HR 58 | Temp 97.7°F | Ht 60.0 in | Wt 89.0 lb

## 2014-10-20 DIAGNOSIS — R195 Other fecal abnormalities: Secondary | ICD-10-CM

## 2014-10-20 DIAGNOSIS — F0391 Unspecified dementia with behavioral disturbance: Secondary | ICD-10-CM | POA: Diagnosis not present

## 2014-10-20 DIAGNOSIS — I10 Essential (primary) hypertension: Secondary | ICD-10-CM | POA: Diagnosis not present

## 2014-10-20 DIAGNOSIS — M545 Low back pain: Secondary | ICD-10-CM | POA: Diagnosis not present

## 2014-10-20 LAB — CBC WITH DIFFERENTIAL/PLATELET
BASOS ABS: 0 10*3/uL (ref 0.0–0.1)
Basophils Relative: 0.5 % (ref 0.0–3.0)
EOS PCT: 1.2 % (ref 0.0–5.0)
Eosinophils Absolute: 0 10*3/uL (ref 0.0–0.7)
HEMATOCRIT: 32.4 % — AB (ref 36.0–46.0)
HEMOGLOBIN: 10.6 g/dL — AB (ref 12.0–15.0)
Lymphocytes Relative: 22.2 % (ref 12.0–46.0)
Lymphs Abs: 0.9 10*3/uL (ref 0.7–4.0)
MCHC: 32.8 g/dL (ref 30.0–36.0)
MCV: 84.3 fl (ref 78.0–100.0)
MONO ABS: 0.3 10*3/uL (ref 0.1–1.0)
MONOS PCT: 7.2 % (ref 3.0–12.0)
NEUTROS ABS: 2.7 10*3/uL (ref 1.4–7.7)
NEUTROS PCT: 68.9 % (ref 43.0–77.0)
Platelets: 178 10*3/uL (ref 150.0–400.0)
RBC: 3.84 Mil/uL — ABNORMAL LOW (ref 3.87–5.11)
RDW: 16.7 % — ABNORMAL HIGH (ref 11.5–15.5)
WBC: 4 10*3/uL (ref 4.0–10.5)

## 2014-10-20 LAB — BASIC METABOLIC PANEL
BUN: 24 mg/dL — ABNORMAL HIGH (ref 6–23)
CALCIUM: 9.5 mg/dL (ref 8.4–10.5)
CHLORIDE: 107 meq/L (ref 96–112)
CO2: 32 mEq/L (ref 19–32)
Creatinine, Ser: 0.92 mg/dL (ref 0.40–1.20)
GFR: 72.85 mL/min (ref >60.00)
Glucose, Bld: 113 mg/dL — ABNORMAL HIGH (ref 70–99)
POTASSIUM: 3.8 meq/L (ref 3.5–5.1)
Sodium: 142 mEq/L (ref 135–145)

## 2014-10-20 MED ORDER — NITROGLYCERIN 0.4 MG SL SUBL: 0.4000 mg | SUBLINGUAL_TABLET | SUBLINGUAL | Status: DC | PRN

## 2014-10-20 NOTE — Assessment & Plan Note (Signed)
stable overall by history and exam, recent data reviewed with pt, and pt to continue medical treatment as before,  to f/u any worsening symptoms or concerns. Lab Results  Component Value Date   WBC 5.3 08/07/2014   HGB 10.4* 08/07/2014   HCT 33.1* 08/07/2014   PLT 183.0 08/07/2014   GLUCOSE 115* 08/07/2014   CHOL 258* 01/07/2008   TRIG 78 01/07/2008   HDL 98.8 01/07/2008   LDLDIRECT 140.0 01/07/2008   ALT 13 08/07/2014   AST 22 08/07/2014   NA 144 08/07/2014   K 4.0 08/07/2014   CL 107 08/07/2014   CREATININE 0.9 08/07/2014   BUN 24* 08/07/2014   CO2 30 08/07/2014   TSH 1.920 03/02/2014   INR 1.13 07/02/2011

## 2014-10-20 NOTE — Assessment & Plan Note (Signed)
hemodynamically stable overall by history and exam, recent data reviewed with pt, and pt to continue medical treatment as before,  to f/u any worsening symptoms or concerns BP Readings from Last 3 Encounters:  10/20/14 122/78  09/23/14 128/80  08/06/14 138/80

## 2014-10-20 NOTE — Progress Notes (Signed)
Subjective:    Patient ID: [name], female    DOB: 1917-08-11, 81 y.o.   MRN: [id number]  HPI  Here with 1 episode of dark stool with BM a few days ago,(only the "first part" of a moderate sized normal consistency stool), none since, no rectal or abd pain, has hx of diverticular bleeding with mult transfusions required before, c/o fatigue but Pt denies chest pain, increased sob or doe, wheezing, orthopnea, PND, increased LE swelling, palpitations, or syncope but did co head swimming dizzines a few days ago after moving about.  . Also with pressure sore recent worsening.  Family asks for cbc.  Pt denies fever, wt loss, night sweats, loss of appetite, or other constitutional symptoms.  Dementia overall stable symptomatically, and not assoc with behavioral changes such as hallucinations, paranoia, or agitation.  Wt Readings from Last 3 Encounters:  10/20/14 89 lb (40.37 kg)  08/06/14 89 lb (40.37 kg)  06/11/14 98 lb (44.453 kg)   Past Medical History  Diagnosis Date  . Internal hemorrhoid 07/2012  . Diverticulosis of colon (without mention of hemorrhage) 07/2012  . Herpes zoster   . Glaucoma   . Lumbar back pain   . Hypertension   . Cardiomyopathy   . Hypothyroid   . Myocardial infarction   . History of blood transfusion     "related to diverticulitis" (12/02/2013)  . GERD (gastroesophageal reflux disease)   . Arthritis     "joints" (12/02/2013)  . Depression   . Anxiety   . Adenomatous colon polyp 07/2012    sessile cecal polyp.  no high grade dysplasia.   Marland Kitchen Hyponatremia 07/2012    Na as low as 114.   . Protein calorie malnutrition 07/2012    BMI then: 16.5  . Zenker diverticulum 07/2012    this and esophageal dysmotility on esophagram.    Past Surgical History  Procedure Laterality Date  . Thyroidectomy    . Appendectomy  2005    ruptured appendix  . Tubal ligation    . Colonoscopy  07/19/2012    Procedure: COLONOSCOPY;  Surgeon: Ladene Artist, MD,FACG;  Location:  [facility];  Service: Endoscopy;  Laterality: N/A;  . Cataract extraction w/ intraocular lens  implant, bilateral Bilateral   . Glaucoma surgery Right     reports that she has never smoked. She has never used smokeless tobacco. She reports that she does not drink alcohol or use illicit drugs. family history is not on file. No Known Allergies Current Outpatient Prescriptions on File Prior to Visit  Medication Sig Dispense Refill  . albuterol (PROVENTIL HFA;VENTOLIN HFA) 108 (90 BASE) MCG/ACT inhaler Inhale 1-2 puffs into the lungs every 4 (four) hours as needed for wheezing or shortness of breath.    . Control Gel Formula Dressing (DUODERM CGF DRESSING) MISC Use as directed once daily to affected area 20 each 5  . feeding supplement, ENSURE COMPLETE, (ENSURE COMPLETE) LIQD Take 237 mLs by mouth 2 (two) times daily between meals. Strawberry.    . furosemide (LASIX) 20 MG tablet Take 1 tablet (20 mg total) by mouth daily as needed for edema. 90 tablet 3  . isosorbide mononitrate (IMDUR) 30 MG 24 hr tablet Take 1 tablet (30 mg total) by mouth daily. 30 tablet 11  . ketoconazole (NIZORAL) 2 % cream Apply 1 application topically 2 (two) times daily. 15 g 0  . levothyroxine (SYNTHROID, LEVOTHROID) 88 MCG tablet Take 1 tablet (88 mcg total) by mouth daily before breakfast. 30  tablet 11  . lisinopril (PRINIVIL,ZESTRIL) 10 MG tablet TAKE 1 TABLET EVERY DAY 30 tablet 11  . mirtazapine (REMERON) 15 MG tablet Take 1 tablet (15 mg total) by mouth at bedtime. 30 tablet 2  . mometasone (NASONEX) 50 MCG/ACT nasal spray Place 2 sprays into the nose daily. 17 g 2  . pantoprazole (PROTONIX) 40 MG tablet Take 1 tablet (40 mg total) by mouth daily. 90 tablet 3  . polyethylene glycol (MIRALAX / GLYCOLAX) packet Take 17 g by mouth daily. On Monday Wednesday and Friday.For constipation. 14 each 6  . sertraline (ZOLOFT) 25 MG tablet Take 1 tablet (25 mg total) by mouth daily. 30 tablet 11  . sucralfate (CARAFATE) 1  GM/10ML suspension Take 10 mLs (1 g total) by mouth 4 (four) times daily -  with meals and at bedtime. 420 mL 3  . [DISCONTINUED] isosorbide dinitrate (ISORDIL) 30 MG tablet Take 30 mg by mouth 3 (three) times daily.      . [DISCONTINUED] ranitidine (ZANTAC) 150 MG tablet Take 150 mg by mouth 2 (two) times daily.       No current facility-administered medications on file prior to visit.   Review of Systems - unable due to dementia     Objective:   Physical Exam Refused DRE BP 122/78 mmHg  Pulse 58  Temp(Src) 97.7 F (36.5 C) (Oral)  Ht 5' (1.524 m)  Wt 89 lb (40.37 kg)  BMI 17.38 kg/m2  SpO2 98% VS noted,  Constitutional: Pt appears well-developed, well-nourished.  HENT: Head: NCAT.  Right Ear: External ear normal.  Left Ear: External ear normal.  Eyes: . Pupils are equal, round, and reactive to light. Conjunctivae and EOM are normal Neck: Normal range of motion. Neck supple.  Cardiovascular: Normal rate and regular rhythm.   Pulmonary/Chest: Effort normal and breath sounds without rales or wheezing.  Abd:  Soft, NT, ND, + BS Neurological: Pt is alert. + confused , motor grossly intact Skin: Skin is warm. No rash Psychiatric: Pt is sleepy but arousable in wheelchair    Assessment & Plan:

## 2014-10-20 NOTE — Progress Notes (Signed)
Pre visit review using our clinic review tool, if applicable. No additional management support is needed unless otherwise documented below in the visit note. 

## 2014-10-20 NOTE — Assessment & Plan Note (Signed)
?   GI bleed, pt asympt as far can tell, exam benign, refuses DRE today, for cbc,  to f/u any worsening symptoms or concerns, family and pt do not want aggressive further evaluation at this time, save any need for transfusion if needed

## 2014-10-20 NOTE — Patient Instructions (Signed)

## 2014-10-21 DIAGNOSIS — M545 Low back pain: Secondary | ICD-10-CM | POA: Diagnosis not present

## 2014-10-22 DIAGNOSIS — M545 Low back pain: Secondary | ICD-10-CM | POA: Diagnosis not present

## 2014-10-23 DIAGNOSIS — M545 Low back pain: Secondary | ICD-10-CM | POA: Diagnosis not present

## 2014-10-26 DIAGNOSIS — M545 Low back pain: Secondary | ICD-10-CM | POA: Diagnosis not present

## 2014-10-27 DIAGNOSIS — M545 Low back pain: Secondary | ICD-10-CM | POA: Diagnosis not present

## 2014-10-28 DIAGNOSIS — M545 Low back pain: Secondary | ICD-10-CM | POA: Diagnosis not present

## 2014-10-29 DIAGNOSIS — M545 Low back pain: Secondary | ICD-10-CM | POA: Diagnosis not present

## 2014-10-30 DIAGNOSIS — M545 Low back pain: Secondary | ICD-10-CM | POA: Diagnosis not present

## 2014-11-02 DIAGNOSIS — M545 Low back pain: Secondary | ICD-10-CM | POA: Diagnosis not present

## 2014-11-03 DIAGNOSIS — M545 Low back pain: Secondary | ICD-10-CM | POA: Diagnosis not present

## 2014-11-04 DIAGNOSIS — M545 Low back pain: Secondary | ICD-10-CM | POA: Diagnosis not present

## 2014-11-05 DIAGNOSIS — M545 Low back pain: Secondary | ICD-10-CM | POA: Diagnosis not present

## 2014-11-06 DIAGNOSIS — M545 Low back pain: Secondary | ICD-10-CM | POA: Diagnosis not present

## 2014-11-09 DIAGNOSIS — M545 Low back pain: Secondary | ICD-10-CM | POA: Diagnosis not present

## 2014-11-10 DIAGNOSIS — M545 Low back pain: Secondary | ICD-10-CM | POA: Diagnosis not present

## 2014-11-11 DIAGNOSIS — M545 Low back pain: Secondary | ICD-10-CM | POA: Diagnosis not present

## 2014-11-12 DIAGNOSIS — M545 Low back pain: Secondary | ICD-10-CM | POA: Diagnosis not present

## 2014-11-13 DIAGNOSIS — M545 Low back pain: Secondary | ICD-10-CM | POA: Diagnosis not present

## 2014-11-16 DIAGNOSIS — M545 Low back pain: Secondary | ICD-10-CM | POA: Diagnosis not present

## 2014-11-17 DIAGNOSIS — M545 Low back pain: Secondary | ICD-10-CM | POA: Diagnosis not present

## 2014-11-18 DIAGNOSIS — M545 Low back pain: Secondary | ICD-10-CM | POA: Diagnosis not present

## 2014-11-19 DIAGNOSIS — M545 Low back pain: Secondary | ICD-10-CM | POA: Diagnosis not present

## 2014-11-20 DIAGNOSIS — M545 Low back pain: Secondary | ICD-10-CM | POA: Diagnosis not present

## 2014-11-23 DIAGNOSIS — M545 Low back pain: Secondary | ICD-10-CM | POA: Diagnosis not present

## 2014-11-24 DIAGNOSIS — M545 Low back pain: Secondary | ICD-10-CM | POA: Diagnosis not present

## 2014-11-25 ENCOUNTER — Other Ambulatory Visit: Payer: Self-pay | Admitting: Internal Medicine

## 2014-11-25 DIAGNOSIS — M545 Low back pain: Secondary | ICD-10-CM | POA: Diagnosis not present

## 2014-11-26 DIAGNOSIS — M545 Low back pain: Secondary | ICD-10-CM | POA: Diagnosis not present

## 2014-11-27 DIAGNOSIS — M545 Low back pain: Secondary | ICD-10-CM | POA: Diagnosis not present

## 2014-11-30 DIAGNOSIS — M545 Low back pain: Secondary | ICD-10-CM | POA: Diagnosis not present

## 2014-12-01 DIAGNOSIS — M545 Low back pain: Secondary | ICD-10-CM | POA: Diagnosis not present

## 2014-12-02 DIAGNOSIS — M545 Low back pain: Secondary | ICD-10-CM | POA: Diagnosis not present

## 2014-12-03 DIAGNOSIS — M545 Low back pain: Secondary | ICD-10-CM | POA: Diagnosis not present

## 2014-12-04 DIAGNOSIS — M545 Low back pain: Secondary | ICD-10-CM | POA: Diagnosis not present

## 2014-12-07 DIAGNOSIS — M545 Low back pain: Secondary | ICD-10-CM | POA: Diagnosis not present

## 2014-12-08 DIAGNOSIS — M545 Low back pain: Secondary | ICD-10-CM | POA: Diagnosis not present

## 2014-12-09 DIAGNOSIS — M545 Low back pain: Secondary | ICD-10-CM | POA: Diagnosis not present

## 2014-12-10 DIAGNOSIS — M545 Low back pain: Secondary | ICD-10-CM | POA: Diagnosis not present

## 2014-12-11 DIAGNOSIS — M545 Low back pain: Secondary | ICD-10-CM | POA: Diagnosis not present

## 2014-12-14 ENCOUNTER — Ambulatory Visit: Payer: Commercial Managed Care - HMO

## 2014-12-14 DIAGNOSIS — M545 Low back pain: Secondary | ICD-10-CM | POA: Diagnosis not present

## 2014-12-15 DIAGNOSIS — M545 Low back pain: Secondary | ICD-10-CM | POA: Diagnosis not present

## 2014-12-16 DIAGNOSIS — M545 Low back pain: Secondary | ICD-10-CM | POA: Diagnosis not present

## 2014-12-17 DIAGNOSIS — M545 Low back pain: Secondary | ICD-10-CM | POA: Diagnosis not present

## 2014-12-18 DIAGNOSIS — M545 Low back pain: Secondary | ICD-10-CM | POA: Diagnosis not present

## 2014-12-21 DIAGNOSIS — M545 Low back pain: Secondary | ICD-10-CM | POA: Diagnosis not present

## 2014-12-22 DIAGNOSIS — M545 Low back pain: Secondary | ICD-10-CM | POA: Diagnosis not present

## 2014-12-23 DIAGNOSIS — M545 Low back pain: Secondary | ICD-10-CM | POA: Diagnosis not present

## 2014-12-24 DIAGNOSIS — M545 Low back pain: Secondary | ICD-10-CM | POA: Diagnosis not present

## 2014-12-25 DIAGNOSIS — M545 Low back pain: Secondary | ICD-10-CM | POA: Diagnosis not present

## 2014-12-28 DIAGNOSIS — M545 Low back pain: Secondary | ICD-10-CM | POA: Diagnosis not present

## 2014-12-29 DIAGNOSIS — M545 Low back pain: Secondary | ICD-10-CM | POA: Diagnosis not present

## 2014-12-30 DIAGNOSIS — M545 Low back pain: Secondary | ICD-10-CM | POA: Diagnosis not present

## 2014-12-31 DIAGNOSIS — M545 Low back pain: Secondary | ICD-10-CM | POA: Diagnosis not present

## 2015-01-01 DIAGNOSIS — M545 Low back pain: Secondary | ICD-10-CM | POA: Diagnosis not present

## 2015-01-04 DIAGNOSIS — M545 Low back pain: Secondary | ICD-10-CM | POA: Diagnosis not present

## 2015-01-05 ENCOUNTER — Encounter: Payer: Self-pay | Admitting: Internal Medicine

## 2015-01-05 ENCOUNTER — Ambulatory Visit (INDEPENDENT_AMBULATORY_CARE_PROVIDER_SITE_OTHER): Payer: Commercial Managed Care - HMO | Admitting: Internal Medicine

## 2015-01-05 VITALS — BP 128/80 | HR 82 | Temp 99.1°F | Resp 16 | Ht 60.0 in | Wt 91.0 lb

## 2015-01-05 DIAGNOSIS — R531 Weakness: Secondary | ICD-10-CM | POA: Insufficient documentation

## 2015-01-05 DIAGNOSIS — I10 Essential (primary) hypertension: Secondary | ICD-10-CM | POA: Diagnosis not present

## 2015-01-05 DIAGNOSIS — F0391 Unspecified dementia with behavioral disturbance: Secondary | ICD-10-CM

## 2015-01-05 DIAGNOSIS — F29 Unspecified psychosis not due to a substance or known physiological condition: Secondary | ICD-10-CM | POA: Diagnosis not present

## 2015-01-05 DIAGNOSIS — M545 Low back pain: Secondary | ICD-10-CM | POA: Diagnosis not present

## 2015-01-05 MED ORDER — DONEPEZIL HCL 5 MG PO TABS: 5.0000 mg | ORAL_TABLET | Freq: Every day | ORAL | Status: DC

## 2015-01-05 NOTE — Patient Instructions (Addendum)
Please take all new medication as prescribed - the aricept 5 mg per day  Please continue all other medications as before, and refills have been done if requested.  Please have the pharmacy call with any other refills you may need.  Please keep your appointments with your specialists as you may have planned  You will be contacted regarding the referral for: MRI  Please go to the LAB in the Basement (turn left off the elevator) for the tests to be done at your convenience if you are able  You will be contacted by phone if any changes need to be made immediately.  Otherwise, you will receive a letter about your results with an explanation, but please check with MyChart first.  Please remember to sign up for MyChart if you have not done so, as this will be important to you in the future with finding out test results, communicating by private email, and scheduling acute appointments online when needed.

## 2015-01-05 NOTE — Progress Notes (Signed)
Subjective:    Patient ID: [name], female    DOB: 09/04/1917, 81 y.o.   MRN: [id number]  HPI  Here with family - pt has been worse behaviorally recently -  Wandering and confused more lately, has been somewhat aggressive twice recently with trying to get her to do something, with Paranoid statements.  Dementia overall stable , no obvious hallucinations, paranoia. Pt denies chest pain, increased sob or doe, wheezing, orthopnea, PND, increased LE swelling, palpitations, dizziness or syncope.  Past Medical History  Diagnosis Date  . Internal hemorrhoid 07/2012  . Diverticulosis of colon (without mention of hemorrhage) 07/2012  . Herpes zoster   . Glaucoma   . Lumbar back pain   . Hypertension   . Cardiomyopathy   . Hypothyroid   . Myocardial infarction   . History of blood transfusion     "related to diverticulitis" (12/02/2013)  . GERD (gastroesophageal reflux disease)   . Arthritis     "joints" (12/02/2013)  . Depression   . Anxiety   . Adenomatous colon polyp 07/2012    sessile cecal polyp.  no high grade dysplasia.   Marland Kitchen Hyponatremia 07/2012    Na as low as 114.   . Protein calorie malnutrition 07/2012    BMI then: 16.5  . Zenker diverticulum 07/2012    this and esophageal dysmotility on esophagram.    Past Surgical History  Procedure Laterality Date  . Thyroidectomy    . Appendectomy  2005    ruptured appendix  . Tubal ligation    . Colonoscopy  07/19/2012    Procedure: COLONOSCOPY;  Surgeon: Ladene Artist, MD,FACG;  Location: [facility];  Service: Endoscopy;  Laterality: N/A;  . Cataract extraction w/ intraocular lens  implant, bilateral Bilateral   . Glaucoma surgery Right     reports that she has never smoked. She has never used smokeless tobacco. She reports that she does not drink alcohol or use illicit drugs. family history is not on file. No Known Allergies Current Outpatient Prescriptions on File Prior to Visit  Medication Sig Dispense Refill  .  albuterol (PROVENTIL HFA;VENTOLIN HFA) 108 (90 BASE) MCG/ACT inhaler Inhale 1-2 puffs into the lungs every 4 (four) hours as needed for wheezing or shortness of breath.    Marland Kitchen CARAFATE 1 GM/10ML suspension TAKE 10 MLS (1 G TOTAL) BY MOUTH 4 (FOUR) TIMES DAILY - WITH MEALS AND AT BEDTIME. 420 mL 3  . Control Gel Formula Dressing (DUODERM CGF DRESSING) MISC Use as directed once daily to affected area 20 each 5  . feeding supplement, ENSURE COMPLETE, (ENSURE COMPLETE) LIQD Take 237 mLs by mouth 2 (two) times daily between meals. Strawberry.    . furosemide (LASIX) 20 MG tablet Take 1 tablet (20 mg total) by mouth daily as needed for edema. 90 tablet 3  . isosorbide mononitrate (IMDUR) 30 MG 24 hr tablet Take 1 tablet (30 mg total) by mouth daily. 30 tablet 11  . ketoconazole (NIZORAL) 2 % cream Apply 1 application topically 2 (two) times daily. 15 g 0  . levothyroxine (SYNTHROID, LEVOTHROID) 88 MCG tablet Take 1 tablet (88 mcg total) by mouth daily before breakfast. 30 tablet 11  . lisinopril (PRINIVIL,ZESTRIL) 10 MG tablet TAKE 1 TABLET EVERY DAY 30 tablet 11  . mirtazapine (REMERON) 15 MG tablet Take 1 tablet (15 mg total) by mouth at bedtime. (Patient not taking: Reported on 01/05/2015) 30 tablet 2  . mometasone (NASONEX) 50 MCG/ACT nasal spray Place 2 sprays into the  nose daily. 17 g 2  . nitroGLYCERIN (NITROSTAT) 0.4 MG SL tablet Place 1 tablet (0.4 mg total) under the tongue every 5 (five) minutes as needed. Chest pains 30 tablet 6  . pantoprazole (PROTONIX) 40 MG tablet Take 1 tablet (40 mg total) by mouth daily. 90 tablet 3  . polyethylene glycol (MIRALAX / GLYCOLAX) packet Take 17 g by mouth daily. On Monday Wednesday and Friday.For constipation. 14 each 6  . sertraline (ZOLOFT) 25 MG tablet Take 1 tablet (25 mg total) by mouth daily. 30 tablet 11  . [DISCONTINUED] isosorbide dinitrate (ISORDIL) 30 MG tablet Take 30 mg by mouth 3 (three) times daily.      . [DISCONTINUED] ranitidine (ZANTAC) 150  MG tablet Take 150 mg by mouth 2 (two) times daily.       No current facility-administered medications on file prior to visit.   Review of Systems - per family  Constitutional: Negative for unusual diaphoresis or night sweats HENT: Negative for ringing in ear or discharge Eyes: Negative for double vision or worsening visual disturbance.  Respiratory: Negative for choking and stridor.   Gastrointestinal: Negative for vomiting or other signifcant bowel change Genitourinary: Negative for hematuria or change in urine volume.  Musculoskeletal: Negative for other MSK pain or swelling Skin: Negative for color change and worsening wound.  Neurological: Negative for tremors and numbness other than noted    Objective:   Physical Exam BP 128/80 mmHg  Pulse 82  Temp(Src) 99.1 F (37.3 C) (Oral)  Resp 16  Ht 5' (1.524 m)  Wt 91 lb (41.277 kg)  BMI 17.77 kg/m2  SpO2 93% VS noted, not ill appearing Constitutional: Pt appears in no significant distress HENT: Head: NCAT.  Right Ear: External ear normal.  Left Ear: External ear normal.  Eyes: . Pupils are equal, round, and reactive to light. Conjunctivae and EOM are normal Neck: Normal range of motion. Neck supple.  Cardiovascular: Normal rate and regular rhythm.   Pulmonary/Chest: Effort normal and breath sounds without rales or wheezing.  Abd:  Soft, NT, ND, + BS Neurological: Pt is alert. + confused , motor grossly intact Skin: Skin is warm. No rash, no LE edema Psychiatric: Pt behavior is normal. No agitation.     Assessment & Plan:

## 2015-01-05 NOTE — Progress Notes (Signed)
Pre visit review using our clinic review tool, if applicable. No additional management support is needed unless otherwise documented below in the visit note. 

## 2015-01-06 ENCOUNTER — Other Ambulatory Visit (INDEPENDENT_AMBULATORY_CARE_PROVIDER_SITE_OTHER): Payer: Commercial Managed Care - HMO

## 2015-01-06 DIAGNOSIS — M545 Low back pain: Secondary | ICD-10-CM | POA: Diagnosis not present

## 2015-01-06 DIAGNOSIS — R531 Weakness: Secondary | ICD-10-CM

## 2015-01-06 LAB — HEPATIC FUNCTION PANEL
ALK PHOS: 84 U/L (ref 39–117)
ALT: 8 U/L (ref 0–35)
AST: 18 U/L (ref 0–37)
Albumin: 3.8 g/dL (ref 3.5–5.2)
Bilirubin, Direct: 0.1 mg/dL (ref 0.0–0.3)
Total Bilirubin: 0.6 mg/dL (ref 0.2–1.2)
Total Protein: 6.8 g/dL (ref 6.0–8.3)

## 2015-01-06 LAB — BASIC METABOLIC PANEL
BUN: 23 mg/dL (ref 6–23)
CHLORIDE: 104 meq/L (ref 96–112)
CO2: 33 mEq/L — ABNORMAL HIGH (ref 19–32)
CREATININE: 1.1 mg/dL (ref 0.40–1.20)
Calcium: 9.2 mg/dL (ref 8.4–10.5)
GFR: 59.25 mL/min — ABNORMAL LOW (ref >60.00)
Glucose, Bld: 95 mg/dL (ref 70–99)
POTASSIUM: 3.8 meq/L (ref 3.5–5.1)
Sodium: 141 mEq/L (ref 135–145)

## 2015-01-06 LAB — URINALYSIS, ROUTINE W REFLEX MICROSCOPIC
Bilirubin Urine: NEGATIVE
HGB URINE DIPSTICK: NEGATIVE
KETONES UR: NEGATIVE
Leukocytes, UA: NEGATIVE
Nitrite: NEGATIVE
RBC / HPF: NONE SEEN (ref 0–?)
SPECIFIC GRAVITY, URINE: 1.015 (ref 1.000–1.030)
UROBILINOGEN UA: 0.2 (ref 0.0–1.0)
Urine Glucose: NEGATIVE
pH: 6 (ref 5.0–8.0)

## 2015-01-06 LAB — CBC WITH DIFFERENTIAL/PLATELET
BASOS ABS: 0 10*3/uL (ref 0.0–0.1)
Basophils Relative: 0.4 % (ref 0.0–3.0)
EOS PCT: 1.1 % (ref 0.0–5.0)
Eosinophils Absolute: 0 10*3/uL (ref 0.0–0.7)
HEMATOCRIT: 30.1 % — AB (ref 36.0–46.0)
HEMOGLOBIN: 9.6 g/dL — AB (ref 12.0–15.0)
Lymphocytes Relative: 34.1 % (ref 12.0–46.0)
Lymphs Abs: 1.5 10*3/uL (ref 0.7–4.0)
MCHC: 31.7 g/dL (ref 30.0–36.0)
MCV: 85 fl (ref 78.0–100.0)
MONOS PCT: 9 % (ref 3.0–12.0)
Monocytes Absolute: 0.4 10*3/uL (ref 0.1–1.0)
NEUTROS ABS: 2.4 10*3/uL (ref 1.4–7.7)
Neutrophils Relative %: 55.4 % (ref 43.0–77.0)
Platelets: 176 10*3/uL (ref 150.0–400.0)
RBC: 3.55 Mil/uL — ABNORMAL LOW (ref 3.87–5.11)
RDW: 15.8 % — AB (ref 11.5–15.5)
WBC: 4.3 10*3/uL (ref 4.0–10.5)

## 2015-01-07 ENCOUNTER — Encounter: Payer: Self-pay | Admitting: Internal Medicine

## 2015-01-07 DIAGNOSIS — M545 Low back pain: Secondary | ICD-10-CM | POA: Diagnosis not present

## 2015-01-07 LAB — TSH: TSH: 0.75 u[IU]/mL (ref 0.35–4.50)

## 2015-01-08 DIAGNOSIS — M545 Low back pain: Secondary | ICD-10-CM | POA: Diagnosis not present

## 2015-01-11 ENCOUNTER — Telehealth: Payer: Self-pay | Admitting: Internal Medicine

## 2015-01-11 DIAGNOSIS — M545 Low back pain: Secondary | ICD-10-CM | POA: Diagnosis not present

## 2015-01-11 DIAGNOSIS — R413 Other amnesia: Secondary | ICD-10-CM

## 2015-01-11 NOTE — Telephone Encounter (Signed)
Daughter states that patient was suppose to have a referral to neurology.  Did not see referral.  Please advise.

## 2015-01-12 DIAGNOSIS — M545 Low back pain: Secondary | ICD-10-CM | POA: Diagnosis not present

## 2015-01-12 NOTE — Telephone Encounter (Signed)
Informed daughter

## 2015-01-12 NOTE — Telephone Encounter (Signed)
Referral done

## 2015-01-13 DIAGNOSIS — M545 Low back pain: Secondary | ICD-10-CM | POA: Diagnosis not present

## 2015-01-14 DIAGNOSIS — M545 Low back pain: Secondary | ICD-10-CM | POA: Diagnosis not present

## 2015-01-15 DIAGNOSIS — M545 Low back pain: Secondary | ICD-10-CM | POA: Diagnosis not present

## 2015-01-16 NOTE — Assessment & Plan Note (Signed)
stable overall by history and exam, recent data reviewed with pt, and pt to continue medical treatment as before,  to f/u any worsening symptoms or concerns BP Readings from Last 3 Encounters:  01/05/15 128/80  10/20/14 122/78  09/23/14 128/80

## 2015-01-16 NOTE — Assessment & Plan Note (Signed)
Etiology unclear, Exam otherwise benign, to check labs as documented, follow with expectant management  

## 2015-01-16 NOTE — Assessment & Plan Note (Signed)
With worsening confusion, to check UA, also for MRI as not done recently, daughter requests neurology referral and aricept like med trial,  to f/u any worsening symptoms or concerns

## 2015-01-16 NOTE — Assessment & Plan Note (Signed)
Consider add seroquel,  to f/u any worsening symptoms or concerns

## 2015-01-18 DIAGNOSIS — M545 Low back pain: Secondary | ICD-10-CM | POA: Diagnosis not present

## 2015-01-19 DIAGNOSIS — M545 Low back pain: Secondary | ICD-10-CM | POA: Diagnosis not present

## 2015-01-20 DIAGNOSIS — M545 Low back pain: Secondary | ICD-10-CM | POA: Diagnosis not present

## 2015-01-21 DIAGNOSIS — M545 Low back pain: Secondary | ICD-10-CM | POA: Diagnosis not present

## 2015-01-22 DIAGNOSIS — M545 Low back pain: Secondary | ICD-10-CM | POA: Diagnosis not present

## 2015-01-25 DIAGNOSIS — M545 Low back pain: Secondary | ICD-10-CM | POA: Diagnosis not present

## 2015-01-25 DIAGNOSIS — Z0279 Encounter for issue of other medical certificate: Secondary | ICD-10-CM

## 2015-01-26 DIAGNOSIS — M545 Low back pain: Secondary | ICD-10-CM | POA: Diagnosis not present

## 2015-01-27 DIAGNOSIS — M545 Low back pain: Secondary | ICD-10-CM | POA: Diagnosis not present

## 2015-01-28 DIAGNOSIS — M545 Low back pain: Secondary | ICD-10-CM | POA: Diagnosis not present

## 2015-01-29 DIAGNOSIS — M545 Low back pain: Secondary | ICD-10-CM | POA: Diagnosis not present

## 2015-02-01 DIAGNOSIS — M545 Low back pain: Secondary | ICD-10-CM | POA: Diagnosis not present

## 2015-02-02 DIAGNOSIS — M545 Low back pain: Secondary | ICD-10-CM | POA: Diagnosis not present

## 2015-02-03 DIAGNOSIS — M545 Low back pain: Secondary | ICD-10-CM | POA: Diagnosis not present

## 2015-02-04 DIAGNOSIS — M545 Low back pain: Secondary | ICD-10-CM | POA: Diagnosis not present

## 2015-02-05 DIAGNOSIS — M545 Low back pain: Secondary | ICD-10-CM | POA: Diagnosis not present

## 2015-02-08 DIAGNOSIS — M545 Low back pain: Secondary | ICD-10-CM | POA: Diagnosis not present

## 2015-02-09 DIAGNOSIS — M545 Low back pain: Secondary | ICD-10-CM | POA: Diagnosis not present

## 2015-02-10 DIAGNOSIS — M545 Low back pain: Secondary | ICD-10-CM | POA: Diagnosis not present

## 2015-02-11 DIAGNOSIS — M545 Low back pain: Secondary | ICD-10-CM | POA: Diagnosis not present

## 2015-02-12 DIAGNOSIS — M545 Low back pain: Secondary | ICD-10-CM | POA: Diagnosis not present

## 2015-02-15 DIAGNOSIS — M545 Low back pain: Secondary | ICD-10-CM | POA: Diagnosis not present

## 2015-02-17 DIAGNOSIS — M545 Low back pain: Secondary | ICD-10-CM | POA: Diagnosis not present

## 2015-02-18 DIAGNOSIS — M545 Low back pain: Secondary | ICD-10-CM | POA: Diagnosis not present

## 2015-02-19 DIAGNOSIS — M545 Low back pain: Secondary | ICD-10-CM | POA: Diagnosis not present

## 2015-02-22 DIAGNOSIS — M545 Low back pain: Secondary | ICD-10-CM | POA: Diagnosis not present

## 2015-02-23 DIAGNOSIS — M545 Low back pain: Secondary | ICD-10-CM | POA: Diagnosis not present

## 2015-02-24 DIAGNOSIS — M545 Low back pain: Secondary | ICD-10-CM | POA: Diagnosis not present

## 2015-02-25 DIAGNOSIS — M545 Low back pain: Secondary | ICD-10-CM | POA: Diagnosis not present

## 2015-02-26 DIAGNOSIS — M545 Low back pain: Secondary | ICD-10-CM | POA: Diagnosis not present

## 2015-03-01 DIAGNOSIS — M545 Low back pain: Secondary | ICD-10-CM | POA: Diagnosis not present

## 2015-03-02 DIAGNOSIS — M545 Low back pain: Secondary | ICD-10-CM | POA: Diagnosis not present

## 2015-03-03 ENCOUNTER — Encounter: Payer: Self-pay | Admitting: Internal Medicine

## 2015-03-03 DIAGNOSIS — M545 Low back pain: Secondary | ICD-10-CM | POA: Diagnosis not present

## 2015-03-04 DIAGNOSIS — M545 Low back pain: Secondary | ICD-10-CM | POA: Diagnosis not present

## 2015-03-05 DIAGNOSIS — M545 Low back pain: Secondary | ICD-10-CM | POA: Diagnosis not present

## 2015-03-07 ENCOUNTER — Emergency Department (HOSPITAL_COMMUNITY): Payer: Commercial Managed Care - HMO

## 2015-03-07 ENCOUNTER — Observation Stay (HOSPITAL_COMMUNITY): Payer: Commercial Managed Care - HMO

## 2015-03-07 ENCOUNTER — Observation Stay (HOSPITAL_COMMUNITY)
Admission: EM | Admit: 2015-03-07 | Discharge: 2015-03-09 | Disposition: A | Discharge status: Home / Self Care | Payer: Commercial Managed Care - HMO | Attending: Internal Medicine | Admitting: Internal Medicine

## 2015-03-07 ENCOUNTER — Encounter (HOSPITAL_COMMUNITY): Payer: Self-pay

## 2015-03-07 DIAGNOSIS — F329 Major depressive disorder, single episode, unspecified: Secondary | ICD-10-CM | POA: Diagnosis not present

## 2015-03-07 DIAGNOSIS — D519 Vitamin B12 deficiency anemia, unspecified: Secondary | ICD-10-CM | POA: Insufficient documentation

## 2015-03-07 DIAGNOSIS — R000 Tachycardia, unspecified: Secondary | ICD-10-CM | POA: Diagnosis not present

## 2015-03-07 DIAGNOSIS — I252 Old myocardial infarction: Secondary | ICD-10-CM | POA: Insufficient documentation

## 2015-03-07 DIAGNOSIS — M1990 Unspecified osteoarthritis, unspecified site: Secondary | ICD-10-CM | POA: Insufficient documentation

## 2015-03-07 DIAGNOSIS — I708 Atherosclerosis of other arteries: Secondary | ICD-10-CM | POA: Diagnosis not present

## 2015-03-07 DIAGNOSIS — I429 Cardiomyopathy, unspecified: Secondary | ICD-10-CM | POA: Insufficient documentation

## 2015-03-07 DIAGNOSIS — R4781 Slurred speech: Secondary | ICD-10-CM | POA: Diagnosis not present

## 2015-03-07 DIAGNOSIS — Z86010 Personal history of colonic polyps: Secondary | ICD-10-CM | POA: Insufficient documentation

## 2015-03-07 DIAGNOSIS — R531 Weakness: Secondary | ICD-10-CM | POA: Diagnosis not present

## 2015-03-07 DIAGNOSIS — E876 Hypokalemia: Secondary | ICD-10-CM | POA: Diagnosis not present

## 2015-03-07 DIAGNOSIS — I2510 Atherosclerotic heart disease of native coronary artery without angina pectoris: Secondary | ICD-10-CM | POA: Insufficient documentation

## 2015-03-07 DIAGNOSIS — F03918 Unspecified dementia, unspecified severity, with other behavioral disturbance: Secondary | ICD-10-CM

## 2015-03-07 DIAGNOSIS — Z66 Do not resuscitate: Secondary | ICD-10-CM | POA: Diagnosis not present

## 2015-03-07 DIAGNOSIS — R1031 Right lower quadrant pain: Secondary | ICD-10-CM | POA: Diagnosis not present

## 2015-03-07 DIAGNOSIS — R627 Adult failure to thrive: Principal | ICD-10-CM | POA: Insufficient documentation

## 2015-03-07 DIAGNOSIS — E860 Dehydration: Secondary | ICD-10-CM | POA: Insufficient documentation

## 2015-03-07 DIAGNOSIS — E870 Hyperosmolality and hypernatremia: Secondary | ICD-10-CM

## 2015-03-07 DIAGNOSIS — E039 Hypothyroidism, unspecified: Secondary | ICD-10-CM | POA: Diagnosis not present

## 2015-03-07 DIAGNOSIS — D649 Anemia, unspecified: Secondary | ICD-10-CM | POA: Insufficient documentation

## 2015-03-07 DIAGNOSIS — F419 Anxiety disorder, unspecified: Secondary | ICD-10-CM | POA: Insufficient documentation

## 2015-03-07 DIAGNOSIS — R4182 Altered mental status, unspecified: Secondary | ICD-10-CM | POA: Diagnosis not present

## 2015-03-07 DIAGNOSIS — I159 Secondary hypertension, unspecified: Secondary | ICD-10-CM

## 2015-03-07 DIAGNOSIS — Z681 Body mass index (BMI) 19 or less, adult: Secondary | ICD-10-CM | POA: Insufficient documentation

## 2015-03-07 DIAGNOSIS — E43 Unspecified severe protein-calorie malnutrition: Secondary | ICD-10-CM | POA: Diagnosis not present

## 2015-03-07 DIAGNOSIS — I10 Essential (primary) hypertension: Secondary | ICD-10-CM | POA: Insufficient documentation

## 2015-03-07 DIAGNOSIS — R109 Unspecified abdominal pain: Secondary | ICD-10-CM | POA: Insufficient documentation

## 2015-03-07 DIAGNOSIS — K219 Gastro-esophageal reflux disease without esophagitis: Secondary | ICD-10-CM | POA: Insufficient documentation

## 2015-03-07 DIAGNOSIS — M8580 Other specified disorders of bone density and structure, unspecified site: Secondary | ICD-10-CM | POA: Diagnosis not present

## 2015-03-07 DIAGNOSIS — Z515 Encounter for palliative care: Secondary | ICD-10-CM | POA: Diagnosis not present

## 2015-03-07 DIAGNOSIS — I7090 Unspecified atherosclerosis: Secondary | ICD-10-CM | POA: Diagnosis not present

## 2015-03-07 DIAGNOSIS — F0391 Unspecified dementia with behavioral disturbance: Secondary | ICD-10-CM | POA: Diagnosis not present

## 2015-03-07 HISTORY — DX: Unspecified psychosis not due to a substance or known physiological condition: F29

## 2015-03-07 HISTORY — DX: Conduct disorder, unspecified: F91.9

## 2015-03-07 LAB — COMPREHENSIVE METABOLIC PANEL
ALT: 10 U/L — ABNORMAL LOW (ref 14–54)
ANION GAP: 9 (ref 5–15)
AST: 20 U/L (ref 15–41)
Albumin: 3.7 g/dL (ref 3.5–5.0)
Alkaline Phosphatase: 78 U/L (ref 38–126)
BILIRUBIN TOTAL: 1.1 mg/dL (ref 0.3–1.2)
BUN: 20 mg/dL (ref 6–20)
CO2: 31 mmol/L (ref 22–32)
Calcium: 9.2 mg/dL (ref 8.9–10.3)
Chloride: 106 mmol/L (ref 101–111)
Creatinine, Ser: 0.9 mg/dL (ref 0.44–1.00)
GFR calc Af Amer: >60 mL/min (ref >60)
GFR calc non Af Amer: 52 mL/min — ABNORMAL LOW (ref >60)
Glucose, Bld: 97 mg/dL (ref 65–99)
POTASSIUM: 4 mmol/L (ref 3.5–5.1)
SODIUM: 146 mmol/L — AB (ref 135–145)
TOTAL PROTEIN: 7.1 g/dL (ref 6.5–8.1)

## 2015-03-07 LAB — URINALYSIS, ROUTINE W REFLEX MICROSCOPIC
Bilirubin Urine: NEGATIVE
Glucose, UA: NEGATIVE mg/dL
HGB URINE DIPSTICK: NEGATIVE
KETONES UR: NEGATIVE mg/dL
Leukocytes, UA: NEGATIVE
Nitrite: NEGATIVE
PROTEIN: NEGATIVE mg/dL
Specific Gravity, Urine: 1.011 (ref 1.005–1.030)
UROBILINOGEN UA: 0.2 mg/dL (ref 0.0–1.0)
pH: 7 (ref 5.0–8.0)

## 2015-03-07 LAB — CBC WITH DIFFERENTIAL/PLATELET
Basophils Absolute: 0 10*3/uL (ref 0.0–0.1)
Basophils Relative: 1 % (ref 0–1)
EOS ABS: 0.1 10*3/uL (ref 0.0–0.7)
Eosinophils Relative: 2 % (ref 0–5)
HEMATOCRIT: 34 % — AB (ref 36.0–46.0)
Hemoglobin: 10.6 g/dL — ABNORMAL LOW (ref 12.0–15.0)
LYMPHS ABS: 1.4 10*3/uL (ref 0.7–4.0)
Lymphocytes Relative: 37 % (ref 12–46)
MCH: 27 pg (ref 26.0–34.0)
MCHC: 31.2 g/dL (ref 30.0–36.0)
MCV: 86.7 fL (ref 78.0–100.0)
MONO ABS: 0.2 10*3/uL (ref 0.1–1.0)
MONOS PCT: 6 % (ref 3–12)
Neutro Abs: 2.1 10*3/uL (ref 1.7–7.7)
Neutrophils Relative %: 54 % (ref 43–77)
PLATELETS: 177 10*3/uL (ref 150–400)
RBC: 3.92 MIL/uL (ref 3.87–5.11)
RDW: 16.2 % — AB (ref 11.5–15.5)
WBC: 3.8 10*3/uL — ABNORMAL LOW (ref 4.0–10.5)

## 2015-03-07 LAB — LACTIC ACID, PLASMA
LACTIC ACID, VENOUS: 0.9 mmol/L (ref 0.5–2.0)
LACTIC ACID, VENOUS: 0.6 mmol/L (ref 0.5–2.0)

## 2015-03-07 LAB — TROPONIN I: Troponin I: <0.03 ng/mL (ref <0.031)

## 2015-03-07 LAB — MRSA PCR SCREENING: MRSA BY PCR: NEGATIVE

## 2015-03-07 MED ORDER — POLYETHYLENE GLYCOL 3350 17 G PO PACK: 17.0000 g | PACK | Freq: Every day | ORAL | Status: DC | PRN

## 2015-03-07 MED ORDER — CETYLPYRIDINIUM CHLORIDE 0.05 % MT LIQD
7.0000 mL | Freq: Two times a day (BID) | OROMUCOSAL | Status: DC
  Administered 2015-03-08 – 2015-03-09 (×3): 7 mL via OROMUCOSAL

## 2015-03-07 MED ORDER — SODIUM CHLORIDE 0.9 % IV SOLN
INTRAVENOUS | Status: DC
  Administered 2015-03-08: 01:00:00 via INTRAVENOUS

## 2015-03-07 MED ORDER — BOOST / RESOURCE BREEZE PO LIQD
1.0000 | Freq: Three times a day (TID) | ORAL | Status: DC
  Administered 2015-03-08: 1 via ORAL

## 2015-03-07 MED ORDER — SALINE SPRAY 0.65 % NA SOLN
1.0000 | Freq: Every day | NASAL | Status: DC | PRN
  Filled 2015-03-07: qty 44

## 2015-03-07 MED ORDER — IOHEXOL 300 MG/ML  SOLN
80.0000 mL | Freq: Once | INTRAMUSCULAR | Status: AC | PRN
  Administered 2015-03-07: 80 mL via INTRAVENOUS

## 2015-03-07 MED ORDER — SERTRALINE HCL 25 MG PO TABS
25.0000 mg | ORAL_TABLET | Freq: Every day | ORAL | Status: DC
  Administered 2015-03-08 – 2015-03-09 (×2): 25 mg via ORAL
  Filled 2015-03-07 (×2): qty 1

## 2015-03-07 MED ORDER — ISOSORBIDE MONONITRATE ER 30 MG PO TB24
30.0000 mg | ORAL_TABLET | Freq: Every day | ORAL | Status: DC
  Administered 2015-03-08 – 2015-03-09 (×2): 30 mg via ORAL
  Filled 2015-03-07 (×3): qty 1

## 2015-03-07 MED ORDER — POLYETHYLENE GLYCOL 3350 17 G PO PACK
17.0000 g | PACK | Freq: Every day | ORAL | Status: DC
  Administered 2015-03-08 – 2015-03-09 (×2): 17 g via ORAL
  Filled 2015-03-07 (×3): qty 1

## 2015-03-07 MED ORDER — LISINOPRIL 10 MG PO TABS: 10.0000 mg | ORAL_TABLET | Freq: Every day | ORAL | Status: DC

## 2015-03-07 MED ORDER — SUCRALFATE 1 GM/10ML PO SUSP
1.0000 g | Freq: Three times a day (TID) | ORAL | Status: DC
  Administered 2015-03-08 – 2015-03-09 (×5): 1 g via ORAL
  Filled 2015-03-07 (×8): qty 10

## 2015-03-07 MED ORDER — LEVOTHYROXINE SODIUM 88 MCG PO TABS
88.0000 ug | ORAL_TABLET | Freq: Every day | ORAL | Status: DC
  Administered 2015-03-08 – 2015-03-09 (×2): 88 ug via ORAL
  Filled 2015-03-07 (×3): qty 1

## 2015-03-07 MED ORDER — ASPIRIN 325 MG PO TABS
325.0000 mg | ORAL_TABLET | Freq: Every day | ORAL | Status: DC
Start: 2015-03-07 — End: 2015-03-08
  Administered 2015-03-08: 325 mg via ORAL
  Filled 2015-03-07: qty 1

## 2015-03-07 MED ORDER — NITROGLYCERIN 0.4 MG SL SUBL: 0.4000 mg | SUBLINGUAL_TABLET | SUBLINGUAL | Status: DC | PRN

## 2015-03-07 MED ORDER — LISINOPRIL 10 MG PO TABS
10.0000 mg | ORAL_TABLET | Freq: Every day | ORAL | Status: DC
  Filled 2015-03-07: qty 1

## 2015-03-07 MED ORDER — HEPARIN SODIUM (PORCINE) 5000 UNIT/ML IJ SOLN
5000.0000 [IU] | Freq: Three times a day (TID) | INTRAMUSCULAR | Status: DC
  Administered 2015-03-07 – 2015-03-09 (×5): 5000 [IU] via SUBCUTANEOUS
  Filled 2015-03-07 (×8): qty 1

## 2015-03-07 MED ORDER — PROMETHAZINE HCL 25 MG PO TABS: 12.5000 mg | ORAL_TABLET | Freq: Four times a day (QID) | ORAL | Status: DC | PRN

## 2015-03-07 MED ORDER — SODIUM CHLORIDE 0.9 % IV SOLN
INTRAVENOUS | Status: DC
  Administered 2015-03-07: 12:00:00 via INTRAVENOUS

## 2015-03-07 MED ORDER — ENSURE ENLIVE PO LIQD
237.0000 mL | Freq: Two times a day (BID) | ORAL | Status: DC
  Administered 2015-03-08: 237 mL via ORAL
  Filled 2015-03-07 (×2): qty 237

## 2015-03-07 MED ORDER — ASPIRIN 300 MG RE SUPP
300.0000 mg | Freq: Every day | RECTAL | Status: DC
  Administered 2015-03-07: 300 mg via RECTAL
  Filled 2015-03-07: qty 1

## 2015-03-07 MED ORDER — SODIUM CHLORIDE 0.9 % IV SOLN: INTRAVENOUS | Status: DC

## 2015-03-07 MED ORDER — CHLORHEXIDINE GLUCONATE 0.12 % MT SOLN
15.0000 mL | Freq: Two times a day (BID) | OROMUCOSAL | Status: DC
  Administered 2015-03-07 – 2015-03-09 (×4): 15 mL via OROMUCOSAL
  Filled 2015-03-07 (×5): qty 15

## 2015-03-07 MED ORDER — PROMETHAZINE HCL 25 MG RE SUPP: 12.5000 mg | Freq: Four times a day (QID) | RECTAL | Status: DC | PRN

## 2015-03-07 MED ORDER — HYDRALAZINE HCL 20 MG/ML IJ SOLN
10.0000 mg | INTRAMUSCULAR | Status: DC | PRN
  Administered 2015-03-07 – 2015-03-08 (×3): 10 mg via INTRAVENOUS
  Filled 2015-03-07 (×3): qty 1

## 2015-03-07 MED ORDER — STROKE: EARLY STAGES OF RECOVERY BOOK
Freq: Once | Status: AC
  Administered 2015-03-07: 21:00:00
  Filled 2015-03-07: qty 1

## 2015-03-07 MED ORDER — POLYVINYL ALCOHOL 1.4 % OP SOLN
1.0000 [drp] | Freq: Two times a day (BID) | OPHTHALMIC | Status: DC | PRN
  Administered 2015-03-08: 1 [drp] via OPHTHALMIC
  Filled 2015-03-07: qty 15

## 2015-03-07 NOTE — ED Notes (Signed)
Pt. Was transported from [facility] [place] back to E.D. Without incident.  Orders recd'. From Dr. Sheran Fava to tx pt. To our ICU/step-down; and pt. Has been assigned to rm. 1230--will call report shortly.

## 2015-03-07 NOTE — ED Provider Notes (Signed)
CSN: [id number]     Arrival date & time 03/07/15  1026 History   First MD Initiated Contact with Patient 03/07/15 1056     Chief Complaint  Patient presents with  . Weakness     Patient is a 81 y.o. female presenting with weakness. The history is provided by a caregiver and a relative. The history is limited by the condition of the patient (Hx dementia).  Weakness  Pt was seen at 1055. Per pt's family: State pt has been "more confused than usual" and "not eating and drinking" for the past 2 days. Pt's family states she was also combative this morning with them. Family concerned regarding "dehydration" and "another UTI."  No reported fevers, no vomiting/diarrhea, no falls. Pt has significant hx of dementia.     Past Medical History  Diagnosis Date  . Internal hemorrhoid 07/2012  . Diverticulosis of colon (without mention of hemorrhage) 07/2012  . Herpes zoster   . Glaucoma   . Lumbar back pain   . Hypertension   . Cardiomyopathy   . Hypothyroid   . Myocardial infarction   . History of blood transfusion     "related to diverticulitis" (12/02/2013)  . GERD (gastroesophageal reflux disease)   . Arthritis     "joints" (12/02/2013)  . Depression   . Anxiety   . Adenomatous colon polyp 07/2012    sessile cecal polyp.  no high grade dysplasia.   Marland Kitchen Hyponatremia 07/2012    Na as low as 114.   . Protein calorie malnutrition 07/2012    BMI then: 16.5  . Zenker diverticulum 07/2012    this and esophageal dysmotility on esophagram.   . Psychosis   . Behavior disturbance    Past Surgical History  Procedure Laterality Date  . Thyroidectomy    . Appendectomy  2005    ruptured appendix  . Tubal ligation    . Colonoscopy  07/19/2012    Procedure: COLONOSCOPY;  Surgeon: Ladene Artist, MD,FACG;  Location: [facility];  Service: Endoscopy;  Laterality: N/A;  . Cataract extraction w/ intraocular lens  implant, bilateral Bilateral   . Glaucoma surgery Right     History  Substance Use  Topics  . Smoking status: Never Smoker   . Smokeless tobacco: Never Used  . Alcohol Use: No    Review of Systems  Unable to perform ROS: Dementia  Neurological: Positive for weakness.      Allergies  Review of patient's allergies indicates no known allergies.  Home Medications   Prior to Admission medications   Medication Sig Start Date End Date Taking? Authorizing Provider  albuterol (PROVENTIL HFA;VENTOLIN HFA) 108 (90 BASE) MCG/ACT inhaler Inhale 1-2 puffs into the lungs every 4 (four) hours as needed for wheezing or shortness of breath.   Yes Historical Provider, MD  CARAFATE 1 GM/10ML suspension TAKE 10 MLS (1 G TOTAL) BY MOUTH 4 (FOUR) TIMES DAILY - WITH MEALS AND AT BEDTIME. 11/25/14  Yes Biagio Borg, MD  donepezil (ARICEPT) 5 MG tablet Take 1 tablet (5 mg total) by mouth at bedtime. 01/05/15  Yes Biagio Borg, MD  feeding supplement, ENSURE COMPLETE, (ENSURE COMPLETE) LIQD Take 237 mLs by mouth 2 (two) times daily between meals. Strawberry. 09/10/12  Yes Neena Rhymes, MD  furosemide (LASIX) 20 MG tablet Take 1 tablet (20 mg total) by mouth daily as needed for edema. 01/13/14  Yes Biagio Borg, MD  isosorbide mononitrate (IMDUR) 30 MG 24 hr tablet Take 1 tablet (  30 mg total) by mouth daily. 05/19/14  Yes Biagio Borg, MD  ketoconazole (NIZORAL) 2 % cream Apply 1 application topically 2 (two) times daily. 06/03/14  Yes Hendricks Limes, MD  levothyroxine (SYNTHROID, LEVOTHROID) 88 MCG tablet Take 1 tablet (88 mcg total) by mouth daily before breakfast. 04/02/14  Yes Biagio Borg, MD  lisinopril (PRINIVIL,ZESTRIL) 10 MG tablet TAKE 1 TABLET EVERY DAY 04/06/14  Yes Biagio Borg, MD  nitroGLYCERIN (NITROSTAT) 0.4 MG SL tablet Place 1 tablet (0.4 mg total) under the tongue every 5 (five) minutes as needed. Chest pains 10/20/14  Yes Biagio Borg, MD  polyethylene glycol [facility] / Floria Raveling) packet Take 17 g by mouth daily. On Monday Wednesday and Friday.For constipation. 05/21/14  Yes Biagio Borg, MD  polyvinyl alcohol (LIQUIFILM TEARS) 1.4 % ophthalmic solution Place 1 drop into both eyes 2 (two) times daily as needed for dry eyes.   Yes Historical Provider, MD  sertraline (ZOLOFT) 25 MG tablet Take 1 tablet (25 mg total) by mouth daily. 05/28/14  Yes Biagio Borg, MD  sodium chloride (OCEAN) 0.65 % SOLN nasal spray Place 1 spray into both nostrils daily as needed for congestion.   Yes Historical Provider, MD  Control Gel Formula Dressing (DUODERM CGF DRESSING) MISC Use as directed once daily to affected area Patient not taking: Reported on 12/29/202016 09/23/14   Biagio Borg, MD  mirtazapine (REMERON) 15 MG tablet Take 1 tablet (15 mg total) by mouth at bedtime. Patient not taking: Reported on 01/05/2015 09/22/14   Biagio Borg, MD  mometasone (NASONEX) 50 MCG/ACT nasal spray Place 2 sprays into the nose daily. Patient not taking: Reported on 12/29/202016 03/10/14   Biagio Borg, MD  pantoprazole (PROTONIX) 40 MG tablet Take 1 tablet (40 mg total) by mouth daily. Patient not taking: Reported on 12/29/202016 04/21/14   Biagio Borg, MD   BP 198/84 mmHg  Pulse 66  Temp(Src) 98.2 F (36.8 C) (Oral)  Resp 16  SpO2 100%  Filed Vitals:   03/07/15 1035 03/07/15 1054 03/07/15 1306  BP: 204/92 198/84 189/93  Pulse: 71 66 61  Temp: 98.2 F (36.8 C)    TempSrc: Oral    Resp: 16 16 17   SpO2: 100% 100% 100%    Physical Exam  1100: Physical examination:  Nursing notes reviewed; Vital signs and O2 SAT reviewed;  Constitutional: Thin, frail. In no acute distress; Head:  Normocephalic, atraumatic; Eyes: EOMI, PERRL, No scleral icterus; ENMT: Mouth and pharynx normal, Mucous membranes dry; Neck: Supple, Full range of motion, No lymphadenopathy; Cardiovascular: Regular rate and rhythm, No gallop; Respiratory: Breath sounds clear & equal bilaterally, No wheezes. Normal respiratory effort/excursion; Chest: Nontender, Movement normal; Abdomen: Soft, Nontender, Nondistended, Normal bowel sounds;  Genitourinary: No CVA tenderness; Extremities: Pulses normal, No tenderness, No edema, No calf edema or asymmetry.; Neuro: Awake, alert. No facial droop. Speech minimal but clear. Grips equal. Strength 4/5 bilat UE's and LE's. Pt moves all extremities spontaneously and to command without apparent gross focal motor deficits.; Skin: Color normal, Warm, Dry.   ED Course  Procedures     EKG Interpretation None      MDM  MDM Reviewed: previous chart, nursing note and vitals Reviewed previous: labs and ECG Interpretation: labs, ECG, x-ray and CT scan      Results for orders placed or performed during the hospital encounter of 03/07/15  Urinalysis, Routine w reflex microscopic (not at [facility])  Result Value Ref Range  Color, Urine YELLOW YELLOW   APPearance CLOUDY (A) CLEAR   Specific Gravity, Urine 1.011 1.005 - 1.030   pH 7.0 5.0 - 8.0   Glucose, UA NEGATIVE NEGATIVE mg/dL   Hgb urine dipstick NEGATIVE NEGATIVE   Bilirubin Urine NEGATIVE NEGATIVE   Ketones, ur NEGATIVE NEGATIVE mg/dL   Protein, ur NEGATIVE NEGATIVE mg/dL   Urobilinogen, UA 0.2 0.0 - 1.0 mg/dL   Nitrite NEGATIVE NEGATIVE   Leukocytes, UA NEGATIVE NEGATIVE  CBC with Differential  Result Value Ref Range   WBC 3.8 (L) 4.0 - 10.5 K/uL   RBC 3.92 3.87 - 5.11 MIL/uL   Hemoglobin 10.6 (L) 12.0 - 15.0 g/dL   HCT 34.0 (L) 36.0 - 46.0 %   MCV 86.7 78.0 - 100.0 fL   MCH 27.0 26.0 - 34.0 pg   MCHC 31.2 30.0 - 36.0 g/dL   RDW 16.2 (H) 11.5 - 15.5 %   Platelets 177 150 - 400 K/uL   Neutrophils Relative % 54 43 - 77 %   Neutro Abs 2.1 1.7 - 7.7 K/uL   Lymphocytes Relative 37 12 - 46 %   Lymphs Abs 1.4 0.7 - 4.0 K/uL   Monocytes Relative 6 3 - 12 %   Monocytes Absolute 0.2 0.1 - 1.0 K/uL   Eosinophils Relative 2 0 - 5 %   Eosinophils Absolute 0.1 0.0 - 0.7 K/uL   Basophils Relative 1 0 - 1 %   Basophils Absolute 0.0 0.0 - 0.1 K/uL  Lactic acid, plasma  Result Value Ref Range   Lactic Acid, Venous 0.9 0.5 - 2.0  mmol/L  Lactic acid, plasma  Result Value Ref Range   Lactic Acid, Venous 0.6 0.5 - 2.0 mmol/L  Troponin I  Result Value Ref Range   Troponin I <0.03 <0.031 ng/mL  Comprehensive metabolic panel  Result Value Ref Range   Sodium 146 (H) 135 - 145 mmol/L   Potassium 4.0 3.5 - 5.1 mmol/L   Chloride 106 101 - 111 mmol/L   CO2 31 22 - 32 mmol/L   Glucose, Bld 97 65 - 99 mg/dL   BUN 20 6 - 20 mg/dL   Creatinine, Ser 0.90 0.44 - 1.00 mg/dL   Calcium 9.2 8.9 - 10.3 mg/dL   Total Protein 7.1 6.5 - 8.1 g/dL   Albumin 3.7 3.5 - 5.0 g/dL   AST 20 15 - 41 U/L   ALT 10 (L) 14 - 54 U/L   Alkaline Phosphatase 78 38 - 126 U/L   Total Bilirubin 1.1 0.3 - 1.2 mg/dL   GFR calc non Af Amer 52 (L) >60 mL/min   GFR calc Af Amer >60 >60 mL/min   Anion gap 9 5 - 15   Dg Chest 2 View 02-21-2015   CLINICAL DATA:  Confusion and weakness for 2 days  EXAM: CHEST  2 VIEW  COMPARISON:  03/10/2014  FINDINGS: The heart is markedly enlarged. Central pulmonary vasculature is prominent with peripheral pruning. No consolidation or mass. Hyperaeration. Stable thoracic spine with osteopenia.  IMPRESSION: No active cardiopulmonary disease.   Electronically Signed   By: Marybelle Killings M.D.   On: 006-01-2015 11:46   Ct Head Wo Contrast 02-21-2015   CLINICAL DATA:  Weakness  EXAM: CT HEAD WITHOUT CONTRAST  TECHNIQUE: Contiguous axial images were obtained from the base of the skull through the vertex without intravenous contrast.  COMPARISON:  12/02/2013  FINDINGS: Global atrophy. Chronic ischemic changes in the periventricular white matter and brainstem.  No mass  effect, midline shift, or acute hemorrhage.  Left lens is absent likely due to postoperative change.  Mastoid air cells are clear.  Visualized sinuses are clear.  IMPRESSION: No acute intracranial pathology.   Electronically Signed   By: Marybelle Killings M.D.   On: 2020-07-1715 11:51    Results for JEILYN, REZNIK (MRN [id number]) as of April 20, 202016 15:37  Ref. Range 03/10/2014  12:24 08/07/2014 15:37 10/20/2014 15:21 01/06/2015 15:41 April 20, 202016 11:57  Hemoglobin Latest Ref Range: 12.0-15.0 g/dL 9.1 (L) 10.4 (L) 10.6 (L) 9.6 (L) 10.6 (L)  HCT Latest Ref Range: 36.0-46.0 % 28.3 (L) 33.1 (L) 32.4 (L) 30.1 (L) 34.0 (L)    1520: H/H per baseline. Pt unable to stand for orthostatic VS (family states pt does stand at home). Pt will not take PO (holds in mouth then spits out). Dx and testing d/w pt's family.  Questions answered.  Verb understanding, agreeable to admit.   T/C to [facility]. Sheran Fava, case discussed, including:  HPI, pertinent PM/SHx, VS/PE, dx testing, ED course and treatment:  Agreeable to admit, requests to write temporary orders, obtain observation tele bed to team [facility].     Francine Graven, DO 03/09/15 [phone]

## 2015-03-07 NOTE — ED Notes (Signed)
Pt. Was unable to ambulate d/t profound weakness. Will transport to [facility] [place] shortly.  Will perform stroke swallow study per Dr. Request.

## 2015-03-07 NOTE — ED Notes (Signed)
I have just called report to Levada Dy, RN in ICU and will transport now.

## 2015-03-07 NOTE — ED Notes (Signed)
She has a daughter and a granddaughter with her who tell me that, for the past two days, pt. Has refused to eat/drink and seems to be weak.  They also tell me she has an appointment to see a neurologist this week.  She is in no distress and if I speak loudly she will answer me.  She denies any pain or discomfort; and I elicit no reaction to palpation of her abd.  Her daughter mentions pt. Has hx of u.t.i., and pt. Was able to void this morning.

## 2015-03-07 NOTE — ED Notes (Signed)
Patient transported to CT 

## 2015-03-07 NOTE — H&P (Signed)
[facility] History and Physical  Solymar Grace JXB:[id number] DOB: May 12, 1917 DOA: 2020/10/2514  Referring physician:  Francine Graven PCP:  Cathlean Cower, MD   Wt Readings from Last 3 Encounters:  01/05/15 41.277 kg (91 lb)  10/20/14 40.37 kg (89 lb)  08/06/14 40.37 kg (89 lb)     Chief Complaint:  Weakness, increased confusion, not eating or drinking  HPI:  The patient is a 81 y.o. year-old female with history of diverticulosis, GIB, HTN, dementia, GERD, CAD, protein calorie malnutrition who presents with lethargy, weakness, no desire to eat or drink today, slurred speech, and inability to swallow.  The patient was last at their baseline health two days ago.  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  She ambulates with a rolling walker and assistance and is oriented to place when she is at home, but not when she leaves home, and not oriented to time.  She recognizes family members and can have Mattheus Rauls conversations, but requires assistance with ADLs.  Yesterday, she was increasingly tired and lay in bed most of the day.  She did eat but only with encouragement.  Other than fatigue, she had no other symptoms yesterday such as fevers, chills, nausea, vomiting, diarrhea, cough, SOB, slurred speech, focal weakness.  This morning, she was somewhat difficult to arouse, her speech was slurred and slow and she had difficulty swallowing.  Family denies facial droop or weakness of an arm or leg.  They encouraged her to eat but she was unwilling or unable.  She was brought to the ER for examination.  According to the family, she was able to stand up with assistance and walk with assistance and her rolling walker to get into the car to come to the hospital.  She did not take her normal medications today due to illness and confusion.    In the ER, VS notable for BP 204/92.  WBC 3.8, hemoglobin at baseline 10.6mg /dl.  Na  146.  UA, CXR, and head CT negative.  She initially was unable to tolerate sips of water but during her ER stay, was able to drink a few sips of water from a straw.  She was started on normal saline.  At the time of my examination, her speech was back to normal and she was sipping without difficulty.  She c/o abdominal pain, particularly in the RLQ.  She is s/p appendectomy but could have diverticulitis.  MRI brain and CT abd/pelvis are pending.  Review of Systems:  General:  Denies fevers, chills, weight loss or gain HEENT:  Denies changes to hearing and vision, rhinorrhea, sinus congestion, sore throat CV:  Denies chest pain and palpitations, lower extremity edema.  PULM:  Denies SOB, wheezing, cough.   GI:  Denies nausea, vomiting, constipation, diarrhea.   GU:  Denies dysuria, frequency, urgency ENDO:  Denies polyuria, polydipsia.   HEME:  Denies hematemesis, blood in stools, melena, abnormal bruising or bleeding.  LYMPH:  Denies lymphadenopathy.   MSK:  Denies arthralgias, myalgias.   DERM:  Denies skin rash or ulcer.   NEURO:  Denies focal numbness, weakness, slurred speech, confusion, facial droop.  PSYCH:  Denies anxiety and depression.    Past Medical History  Diagnosis Date  . Internal hemorrhoid 07/2012  . Diverticulosis of colon (without mention of hemorrhage) 07/2012  . Herpes zoster   . Glaucoma   . Lumbar back pain   .  Hypertension   . Cardiomyopathy   . Hypothyroid   . Myocardial infarction   . History of blood transfusion     "related to diverticulitis" (12/02/2013)  . GERD (gastroesophageal reflux disease)   . Arthritis     "joints" (12/02/2013)  . Depression   . Anxiety   . Adenomatous colon polyp 07/2012    sessile cecal polyp.  no high grade dysplasia.   Marland Kitchen Hyponatremia 07/2012    Na as low as 114.   . Protein calorie malnutrition 07/2012    BMI then: 16.5  . Zenker diverticulum 07/2012    this and esophageal dysmotility on esophagram.   . Psychosis   .  Behavior disturbance    Past Surgical History  Procedure Laterality Date  . Thyroidectomy    . Appendectomy  2005    ruptured appendix  . Tubal ligation    . Colonoscopy  07/19/2012    Procedure: COLONOSCOPY;  Surgeon: Ladene Artist, MD,FACG;  Location: [facility];  Service: Endoscopy;  Laterality: N/A;  . Cataract extraction w/ intraocular lens  implant, bilateral Bilateral   . Glaucoma surgery Right    Social History:  reports that she has never smoked. She has never used smokeless tobacco. She reports that she does not drink alcohol or use illicit drugs.  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  She ambulates with a rolling walker and assistance and is oriented to place when she is at home, but not when she leaves home, and not oriented to time.  She recognizes family members and can have Jurline Folger conversations, but requires assistance with ADLs.    No Known Allergies  Family History  Problem Relation Age of Onset  . Colon cancer Neg Hx   . Stroke Daughter   . Heart attack    . Dementia Sister      Prior to Admission medications   Medication Sig Start Date End Date Taking? Authorizing Provider  albuterol (PROVENTIL HFA;VENTOLIN HFA) 108 (90 BASE) MCG/ACT inhaler Inhale 1-2 puffs into the lungs every 4 (four) hours as needed for wheezing or shortness of breath.   Yes Historical Provider, MD  CARAFATE 1 GM/10ML suspension TAKE 10 MLS (1 G TOTAL) BY MOUTH 4 (FOUR) TIMES DAILY - WITH MEALS AND AT BEDTIME. 11/25/14  Yes Biagio Borg, MD  donepezil (ARICEPT) 5 MG tablet Take 1 tablet (5 mg total) by mouth at bedtime. 01/05/15  Yes Biagio Borg, MD  feeding supplement, ENSURE COMPLETE, (ENSURE COMPLETE) LIQD Take 237 mLs by mouth 2 (two) times daily between meals. Strawberry. 09/10/12  Yes Neena Rhymes, MD  furosemide (LASIX) 20 MG tablet Take 1 tablet (20 mg total) by mouth daily as needed for  edema. 01/13/14  Yes Biagio Borg, MD  isosorbide mononitrate (IMDUR) 30 MG 24 hr tablet Take 1 tablet (30 mg total) by mouth daily. 05/19/14  Yes Biagio Borg, MD  ketoconazole (NIZORAL) 2 % cream Apply 1 application topically 2 (two) times daily. 06/03/14  Yes Hendricks Limes, MD  levothyroxine (SYNTHROID, LEVOTHROID) 88 MCG tablet Take 1 tablet (88 mcg total) by mouth daily before breakfast. 04/02/14  Yes Biagio Borg, MD  lisinopril (PRINIVIL,ZESTRIL) 10 MG tablet TAKE 1 TABLET EVERY DAY 04/06/14  Yes Biagio Borg, MD  nitroGLYCERIN (NITROSTAT) 0.4 MG SL tablet Place 1 tablet (0.4 mg total) under the tongue  every 5 (five) minutes as needed. Chest pains 10/20/14  Yes Biagio Borg, MD  polyethylene glycol [facility] Floria Raveling) packet Take 17 g by mouth daily. On Monday Wednesday and Friday.For constipation. 05/21/14  Yes Biagio Borg, MD  polyvinyl alcohol (LIQUIFILM TEARS) 1.4 % ophthalmic solution Place 1 drop into both eyes 2 (two) times daily as needed for dry eyes.   Yes Historical Provider, MD  sertraline (ZOLOFT) 25 MG tablet Take 1 tablet (25 mg total) by mouth daily. 05/28/14  Yes Biagio Borg, MD  sodium chloride (OCEAN) 0.65 % SOLN nasal spray Place 1 spray into both nostrils daily as needed for congestion.   Yes Historical Provider, MD  Control Gel Formula Dressing (DUODERM CGF DRESSING) MISC Use as directed once daily to affected area Patient not taking: Reported on 04-Jun-202016 09/23/14   Biagio Borg, MD  mirtazapine (REMERON) 15 MG tablet Take 1 tablet (15 mg total) by mouth at bedtime. Patient not taking: Reported on 01/05/2015 09/22/14   Biagio Borg, MD  mometasone (NASONEX) 50 MCG/ACT nasal spray Place 2 sprays into the nose daily. Patient not taking: Reported on 04-Jun-202016 03/10/14   Biagio Borg, MD  pantoprazole (PROTONIX) 40 MG tablet Take 1 tablet (40 mg total) by mouth daily. Patient not taking: Reported on 04-Jun-202016 04/21/14   Biagio Borg, MD   Physical Exam: Filed Vitals:   03/07/15 1035  03/07/15 1054 03/07/15 1306 03/07/15 1630  BP: 204/92 198/84 189/93 205/82  Pulse: 71 66 61 69  Temp: 98.2 F (36.8 C)     TempSrc: Oral     Resp: 16 16 17 21   SpO2: 100% 100% 100% 98%     General:  Cachectic female, NAD, able to answer simple questions  Eyes:  PERRL, anicteric, non-injected.  ENT:  Nares clear.  OP clear. Mouth moist after sipping water.  Neck:  Supple without TM or JVD.    Lymph:  No cervical, supraclavicular, or submandibular LAD.  Cardiovascular:  RRR, normal S1, S2, without m/r/g.  2+ pulses, warm extremities  Respiratory:  CTA bilaterally without increased WOB.  Abdomen:  NABS.  Soft, ND.  TTP worst in the RLQ but less so in the RUQ and LLQ  Skin:  Stage I sacral decub ulcer  Musculoskeletal:  Normal bulk and tone.  Trace bilateral LE edema.  Pain over left buttock with swelling.  Psychiatric:  A & O to person and place but not time  Appropriate affect.  Neurologic:  CN 3-12 intact.  4/5 strength throughout.  Sensation intact.  Mild neglect of the left arm and leg.  Labs on Admission:  Basic Metabolic Panel:  Recent Labs Lab 03/07/15 1157  NA 146*  K 4.0  CL 106  CO2 31  GLUCOSE 97  BUN 20  CREATININE 0.90  CALCIUM 9.2   Liver Function Tests:  Recent Labs Lab 03/07/15 1157  AST 20  ALT 10*  ALKPHOS 78  BILITOT 1.1  PROT 7.1  ALBUMIN 3.7   No results for input(s): LIPASE, AMYLASE in the last 168 hours. No results for input(s): AMMONIA in the last 168 hours. CBC:  Recent Labs Lab 03/07/15 1157  WBC 3.8*  NEUTROABS 2.1  HGB 10.6*  HCT 34.0*  MCV 86.7  PLT 177   Cardiac Enzymes:  Recent Labs Lab 03/07/15 1157  TROPONINI <0.03    BNP (last 3 results) No results for input(s): BNP in the last 8760 hours.  ProBNP (last 3 results) No  results for input(s): PROBNP in the last 8760 hours.  CBG: No results for input(s): GLUCAP in the last 168 hours.  Radiological Exams on Admission: Dg Chest 2 View  2020/04/1715    CLINICAL DATA:  Confusion and weakness for 2 days  EXAM: CHEST  2 VIEW  COMPARISON:  03/10/2014  FINDINGS: The heart is markedly enlarged. Central pulmonary vasculature is prominent with peripheral pruning. No consolidation or mass. Hyperaeration. Stable thoracic spine with osteopenia.  IMPRESSION: No active cardiopulmonary disease.   Electronically Signed   By: Marybelle Killings M.D.   On: 02020/04/1715 11:46   Ct Head Wo Contrast  2020/04/1715   CLINICAL DATA:  Weakness  EXAM: CT HEAD WITHOUT CONTRAST  TECHNIQUE: Contiguous axial images were obtained from the base of the skull through the vertex without intravenous contrast.  COMPARISON:  12/02/2013  FINDINGS: Global atrophy. Chronic ischemic changes in the periventricular white matter and brainstem.  No mass effect, midline shift, or acute hemorrhage.  Left lens is absent likely due to postoperative change.  Mastoid air cells are clear.  Visualized sinuses are clear.  IMPRESSION: No acute intracranial pathology.   Electronically Signed   By: Marybelle Killings M.D.   On: 02020/04/1715 11:51    EKG: Independently reviewed. NSR, prolonged QTc 532  Assessment/Plan Active Problems:   Essential hypertension   Abdominal pain   General weakness   Hypertension  ---  Generalized weakness, slurred speech, dysphagia given her age and dementia may be related to an underlying metabolic problem.  So far, there is no sign of infection on labs, UA, or CXR, however, she may have some mild diverticulosis.  Alternatively, given her very elevated blood pressures, she may have had a TIA or small stroke, particularly since she seems to have some mild neglect of the left arm and leg. -  MRI brain and if positive for stroke, would discuss further work up with family before pursuing since she is not a great candidate for procedures given her malnutrition, frailty, and dementia -  Telemetry:  If positive for a-fib could consider low dose asa.  Would not do full a/c due to her history of  life-threatening diverticular hemorrhages in the past -  RN stroke swallow -  Speech therapy evaluation -  PT/OT evaluation -  Daily aspirin  -  CBG normal so diabetes unlikely -  Not a candidate for statin due to age and frailty  Hypertension, may be related to pain, stress, illness -  Resume lisinopril -  Prn hydralazine for now  Abdominal pain concerning for diverticulitis, although perhaps she is constipated.  Also at risk for ischemic colitis although she is not having diarrhea or rectal bleeding -  LFts and lipase wnl -  UA neg -  Check CT ab/pelvis -  Low dose phenergan as needed (no zofran secondary to QTc prolongation)  Prolonged QTc -  Avoid QTc prolonging medications -  Check magnesium  Dementia with psychosis, family are medication minimalists -  Frequent reorientation -  Neurology appointment scheduled for a few days from now -  Continue zoloft  GERD, stable, continue carafate  CAD, chest pain free, no ischemic changes on ECG - not on asa, statin, or bb per PCP - continue imdur and prn ntg  Hypothyroidism, TSH 0.75 01/06/2015 -  Continue synthroid  Malnutrition with stable weight -  Continue regular diet and supplements -  Nutrition consultation  Hypernatremia due to dehydration -  IVF and resume diet  Normocytic anemia, at risk  for vitamin deficiencies  -  Iron studies, B12, folate -  Occult stool -  Repeat hgb in AM  Diet:  If passes stroke swallow:  regular Access:  PIV IVF:  yes Proph:  heparin  Code Status: DNR Family Communication: patient and her two daughters and son Disposition Plan: Admit to telemetry  Time spent: 60 min Janece Canterbury [facility] Pager [phone]  If 7PM-7AM, please contact night-coverage [email or web address] Password [facility] 11/07/2014, 4:58 PM

## 2015-03-07 NOTE — Progress Notes (Signed)
NIH scale is 13 however pt is not neglecting one side, pt is very sleepy, arouses and quickly returns to sleep.  Score reflects drowsiness more than stroke like symptoms.  Fara Olden P

## 2015-03-08 ENCOUNTER — Observation Stay (HOSPITAL_COMMUNITY): Payer: Commercial Managed Care - HMO

## 2015-03-08 ENCOUNTER — Encounter (HOSPITAL_COMMUNITY): Payer: Self-pay

## 2015-03-08 ENCOUNTER — Telehealth: Payer: Self-pay | Admitting: Neurology

## 2015-03-08 DIAGNOSIS — R4781 Slurred speech: Secondary | ICD-10-CM | POA: Diagnosis not present

## 2015-03-08 DIAGNOSIS — E860 Dehydration: Secondary | ICD-10-CM | POA: Diagnosis not present

## 2015-03-08 DIAGNOSIS — E870 Hyperosmolality and hypernatremia: Secondary | ICD-10-CM

## 2015-03-08 DIAGNOSIS — F0391 Unspecified dementia with behavioral disturbance: Secondary | ICD-10-CM | POA: Diagnosis not present

## 2015-03-08 DIAGNOSIS — R109 Unspecified abdominal pain: Secondary | ICD-10-CM | POA: Diagnosis not present

## 2015-03-08 DIAGNOSIS — R531 Weakness: Secondary | ICD-10-CM | POA: Diagnosis not present

## 2015-03-08 DIAGNOSIS — R1031 Right lower quadrant pain: Secondary | ICD-10-CM | POA: Diagnosis not present

## 2015-03-08 DIAGNOSIS — E43 Unspecified severe protein-calorie malnutrition: Secondary | ICD-10-CM | POA: Diagnosis not present

## 2015-03-08 DIAGNOSIS — M545 Low back pain: Secondary | ICD-10-CM | POA: Diagnosis not present

## 2015-03-08 DIAGNOSIS — I10 Essential (primary) hypertension: Secondary | ICD-10-CM | POA: Diagnosis not present

## 2015-03-08 DIAGNOSIS — K219 Gastro-esophageal reflux disease without esophagitis: Secondary | ICD-10-CM | POA: Diagnosis not present

## 2015-03-08 DIAGNOSIS — R627 Adult failure to thrive: Secondary | ICD-10-CM | POA: Diagnosis not present

## 2015-03-08 LAB — FERRITIN: Ferritin: 26 ng/mL (ref 11–307)

## 2015-03-08 LAB — BASIC METABOLIC PANEL
Anion gap: 14 (ref 5–15)
BUN: 18 mg/dL (ref 6–20)
CALCIUM: 9.3 mg/dL (ref 8.9–10.3)
CHLORIDE: 103 mmol/L (ref 101–111)
CO2: 26 mmol/L (ref 22–32)
Creatinine, Ser: 0.9 mg/dL (ref 0.44–1.00)
GFR calc Af Amer: >60 mL/min (ref >60)
GFR calc non Af Amer: 52 mL/min — ABNORMAL LOW (ref >60)
GLUCOSE: 114 mg/dL — AB (ref 65–99)
Potassium: 3.2 mmol/L — ABNORMAL LOW (ref 3.5–5.1)
SODIUM: 143 mmol/L (ref 135–145)

## 2015-03-08 LAB — CBC
HEMATOCRIT: 36.6 % (ref 36.0–46.0)
Hemoglobin: 11.7 g/dL — ABNORMAL LOW (ref 12.0–15.0)
MCH: 27.5 pg (ref 26.0–34.0)
MCHC: 32 g/dL (ref 30.0–36.0)
MCV: 86.1 fL (ref 78.0–100.0)
Platelets: 193 10*3/uL (ref 150–400)
RBC: 4.25 MIL/uL (ref 3.87–5.11)
RDW: 16.3 % — AB (ref 11.5–15.5)
WBC: 5.3 10*3/uL (ref 4.0–10.5)

## 2015-03-08 LAB — MAGNESIUM: MAGNESIUM: 1.4 mg/dL — AB (ref 1.7–2.4)

## 2015-03-08 LAB — IRON AND TIBC
IRON: 67 ug/dL (ref 28–170)
Saturation Ratios: 16 % (ref 10.4–31.8)
TIBC: 414 ug/dL (ref 250–450)
UIBC: 347 ug/dL

## 2015-03-08 LAB — VITAMIN B12: VITAMIN B 12: 166 pg/mL — AB (ref 180–914)

## 2015-03-08 LAB — FOLATE: Folate: 37 ng/mL (ref >5.9)

## 2015-03-08 MED ORDER — CYANOCOBALAMIN 1000 MCG/ML IJ SOLN
1000.0000 ug | Freq: Once | INTRAMUSCULAR | Status: AC
  Administered 2015-03-08: 1000 ug via INTRAMUSCULAR
  Filled 2015-03-08: qty 1

## 2015-03-08 MED ORDER — KCL IN DEXTROSE-NACL 20-5-0.45 MEQ/L-%-% IV SOLN
INTRAVENOUS | Status: DC
  Administered 2015-03-08 – 2015-03-09 (×2): via INTRAVENOUS
  Filled 2015-03-08 (×2): qty 1000

## 2015-03-08 MED ORDER — LISINOPRIL 10 MG PO TABS: 20.0000 mg | ORAL_TABLET | Freq: Every day | ORAL | Status: DC

## 2015-03-08 MED ORDER — LISINOPRIL 10 MG PO TABS
10.0000 mg | ORAL_TABLET | Freq: Every day | ORAL | Status: DC
  Administered 2015-03-08 – 2015-03-09 (×2): 10 mg via ORAL
  Filled 2015-03-08 (×2): qty 1

## 2015-03-08 MED ORDER — POTASSIUM CHLORIDE 10 MEQ/100ML IV SOLN
10.0000 meq | INTRAVENOUS | Status: AC
  Administered 2015-03-08 (×2): 10 meq via INTRAVENOUS
  Filled 2015-03-08: qty 100

## 2015-03-08 MED ORDER — POTASSIUM CHLORIDE CRYS ER 20 MEQ PO TBCR: 40.0000 meq | EXTENDED_RELEASE_TABLET | Freq: Once | ORAL | Status: DC

## 2015-03-08 MED ORDER — MAGNESIUM CHLORIDE 64 MG PO TBEC
2.0000 | DELAYED_RELEASE_TABLET | Freq: Two times a day (BID) | ORAL | Status: DC
  Administered 2015-03-08 – 2015-03-09 (×3): 128 mg via ORAL
  Filled 2015-03-08 (×5): qty 2

## 2015-03-08 NOTE — Progress Notes (Addendum)
[facility] PROGRESS NOTE  [name] DZH:[id number] DOB: 02/27/1917 DOA: July 04, 202016 PCP: Cathlean Cower, MD  Brief Summary  The patient is a 81 y.o. year-old female with history of diverticulosis, GIB, HTN, dementia, GERD, CAD, protein calorie malnutrition who presents with lethargy, weakness, no desire to eat or drink, slurred speech, and inability to swallow. She was brought to the ER for examination. According to the family, she was able to stand up with assistance and walk with assistance and her rolling walker to get into the car to come to the hospital. In the ER, VS notable for BP 204/92. Na 146. UA, CXR, and head CT negative. She initially was unable to tolerate sips of water but during her ER stay, was able to drink a few sips of water from a straw. She was started on normal saline. At the time of my examination, her speech was back to normal and she was sipping without difficulty. She c/o abdominal pain, particularly in the RLQ, but CT was negative for diverticulitis or acute abnormality.  Assessment/Plan  Generalized weakness, slurred speech, dysphagia given her age and dementia may be related to an underlying metabolic problem. So far, there is no sign of infection on labs, UA, or CXR, however, she may have some mild diverticulosis. Alternatively, given her very elevated blood pressures, she may have had a TIA or small stroke, particularly since she seems to have some mild neglect of the left arm and leg. - MRI brain and if positive for stroke, would discuss further work up with family before pursuing since she is not a great candidate for procedures given her malnutrition, frailty, and dementia - Telemetry: intermittent sinus tachycardia with ectopic atrial beats and rhythms.   If positive for a-fib could consider low dose asa. Would not do full a/c due to her history of life-threatening diverticular hemorrhages in the past - RN stroke swallow:  Failed.  Continue to  hold oral medications pending formal swallow evaluation - Speech therapy evaluation - PT/OT evaluation - Daily aspirin  - CBG normal so diabetes unlikely - Not a candidate for statin due to age and frailty  Hypertension, may be related to pain, stress, illness - continue lisinopril at current dose  - Prn hydralazine for now  Abdominal pain concerning for diverticulitis, although perhaps she is constipated. Also at risk for ischemic colitis although she is not having diarrhea or rectal bleeding - LFts and lipase wnl - UA neg - CT ab/pelvis negative for acute abnormality - Low dose phenergan as needed (no zofran secondary to QTc prolongation)  Prolonged QTc - Avoid QTc prolonging medications - Check magnesium:  1.4.  Start oral magnesium supplementation  Dementia with psychosis, family are medication minimalists - Frequent reorientation - Neurology appointment scheduled for a few days from now - Continue zoloft once seen by speech  GERD, stable, continue carafate  CAD, chest pain free, no ischemic changes on ECG - not on asa, statin, or bb per PCP - continue imdur and prn ntg  Hypothyroidism, TSH 0.75 01/06/2015 - Continue synthroid  Malnutrition with stable weight - Continue regular diet and supplements - Nutrition consultation  Hypernatremia due to dehydration, resolving - decrease IVF and resume diet asap  Normocytic anemia, at risk for vitamin deficiencies, hgb stable to improved - Iron studies, B12, folate pending - Occult stool not obtained because not a candidate for work up for colon malignancy, no obvious rectal bleeding  Hypokalemia due to malnutrition -  Start potassium supplementation  Diet: NPO  pending speech evaluation Access: PIV IVF: yes Proph: heparin  Code Status: DNR Family Communication: patient and her two daughters and son Disposition Plan:  Transfer from stepdown to med-surg bed if no obvious stroke while  awaiting PT/OT/SLP.  Once therapies are complete, she will be ready for discharge.  Palliative care consult ordered  Consultants:  None  Procedures:  CT abd/pelvis:  No acute abnormality  Antibiotics:  none   HPI/Subjective:  Patient sleepy this morning.  Still having some pain in her stomach this morning, but denies SOB.  Had a coughing spell per nursing staff that caused some tachycardiac.  She has been harboring her secretions and was choked up on on them.  They have been suctioning her mouth. Objective: Filed Vitals:   03/08/15 0400 03/08/15 0401 03/08/15 0600 03/08/15 0800  BP: 193/81  141/56   Pulse: 82  82   Temp:  98.1 F (36.7 C)  97.8 F (36.6 C)  TempSrc:  Axillary  Axillary  Resp: 24  16   Height:      Weight:      SpO2: 100%  100%     Intake/Output Summary (Last 24 hours) at 03/08/15 0817 Last data filed at 03/08/15 0600  Gross per 24 hour  Intake 1228.75 ml  Output      0 ml  Net 1228.75 ml   Filed Weights   03/07/15 1852  Weight: 40.1 kg (88 lb 6.5 oz)    Exam:   General:  Cachectic/frail female, No acute distress  HEENT:  NCAT, MMM  Cardiovascular:  RRR, nl S1, S2 no mrg, 2+ pulses, warm extremities  Respiratory:  Course rales at bilateral bases, no wheezes or rhonchi, no increased WOB  Abdomen:   NABS, scaphoid, soft, mild TTP  MSK:   Normal tone and bulk, no LEE  Neuro:  Grossly moves all extremities but continues to need additional prompting with the left extremities.    Data Reviewed: Basic Metabolic Panel:  Recent Labs Lab 03/07/15 1157 03/08/15 0345  NA 146* 143  K 4.0 3.2*  CL 106 103  CO2 31 26  GLUCOSE 97 114*  BUN 20 18  CREATININE 0.90 0.90  CALCIUM 9.2 9.3  MG  --  1.4*   Liver Function Tests:  Recent Labs Lab 03/07/15 1157  AST 20  ALT 10*  ALKPHOS 78  BILITOT 1.1  PROT 7.1  ALBUMIN 3.7   No results for input(s): LIPASE, AMYLASE in the last 168 hours. No results for input(s): AMMONIA in the last  168 hours. CBC:  Recent Labs Lab 03/07/15 1157 03/08/15 0345  WBC 3.8* 5.3  NEUTROABS 2.1  --   HGB 10.6* 11.7*  HCT 34.0* 36.6  MCV 86.7 86.1  PLT 177 193   Cardiac Enzymes:  Recent Labs Lab 03/07/15 1157  TROPONINI <0.03   BNP (last 3 results) No results for input(s): BNP in the last 8760 hours.  ProBNP (last 3 results) No results for input(s): PROBNP in the last 8760 hours.  CBG: No results for input(s): GLUCAP in the last 168 hours.  Recent Results (from the past 240 hour(s))  MRSA PCR Screening     Status: None   Collection Time: 03/07/15  7:00 PM  Result Value Ref Range Status   MRSA by PCR NEGATIVE NEGATIVE Final    Comment:        The GeneXpert MRSA Assay (FDA approved for NASAL specimens only), is one component of a comprehensive MRSA colonization surveillance program. It  is not intended to diagnose MRSA infection nor to guide or monitor treatment for MRSA infections.      Studies: Dg Chest 2 View  02-22-2015   CLINICAL DATA:  Confusion and weakness for 2 days  EXAM: CHEST  2 VIEW  COMPARISON:  03/10/2014  FINDINGS: The heart is markedly enlarged. Central pulmonary vasculature is prominent with peripheral pruning. No consolidation or mass. Hyperaeration. Stable thoracic spine with osteopenia.  IMPRESSION: No active cardiopulmonary disease.   Electronically Signed   By: Marybelle Killings M.D.   On: 006-02-2015 11:46   Ct Head Wo Contrast  02-22-2015   CLINICAL DATA:  Weakness  EXAM: CT HEAD WITHOUT CONTRAST  TECHNIQUE: Contiguous axial images were obtained from the base of the skull through the vertex without intravenous contrast.  COMPARISON:  12/02/2013  FINDINGS: Global atrophy. Chronic ischemic changes in the periventricular white matter and brainstem.  No mass effect, midline shift, or acute hemorrhage.  Left lens is absent likely due to postoperative change.  Mastoid air cells are clear.  Visualized sinuses are clear.  IMPRESSION: No acute intracranial  pathology.   Electronically Signed   By: Marybelle Killings M.D.   On: 006-02-2015 11:51   Ct Abdomen Pelvis W Contrast  02-22-2015   CLINICAL DATA:  The patient has refused eat or drink for the past 2 days. Weakness. Initial encounter.  EXAM: CT ABDOMEN AND PELVIS WITH CONTRAST  TECHNIQUE: Multidetector CT imaging of the abdomen and pelvis was performed using the standard protocol following bolus administration of intravenous contrast.  CONTRAST:  80 mL OMNIPAQUE IOHEXOL 300 MG/ML  SOLN  COMPARISON:  CT abdomen and pelvis 07/16/2012.  FINDINGS: Mild dependent atelectasis is seen lung bases. There is cardiomegaly. No pleural or pericardial effusion.  Small bilateral renal cysts are seen and unchanged. A few small hepatic cysts are also unchanged. The spleen, adrenal glands and pancreas are unremarkable. There is extensive aortoiliac atherosclerosis without aneurysm. Uterus, adnexa and urinary bladder appear normal.  The stomach and small and large bowel appear normal. The appendix is unremarkable. There is no lymphadenopathy or fluid.  No focal bony abnormality is identified. There is some degenerative change about the hips and symphysis pubis. Bones appear osteopenic.  IMPRESSION: No acute finding abdomen or pelvis.  Atherosclerotic vascular disease.  Osteopenia.   Electronically Signed   By: Inge Rise M.D.   On: 006-02-2015 16:56    Scheduled Meds: . antiseptic oral rinse  7 mL Mouth Rinse q12n4p  . aspirin  300 mg Rectal Daily   Or  . aspirin  325 mg Oral Daily  . chlorhexidine  15 mL Mouth Rinse BID  . feeding supplement (ENSURE ENLIVE)  237 mL Oral BID BM  . feeding supplement (RESOURCE BREEZE)  1 Container Oral TID BM  . heparin  5,000 Units Subcutaneous 3 times per day  . isosorbide mononitrate  30 mg Oral Daily  . levothyroxine  88 mcg Oral QAC breakfast  . lisinopril  10 mg Oral Daily  . polyethylene glycol  17 g Oral Daily  . potassium chloride  10 mEq Intravenous Q1 Hr x 2  . sertraline   25 mg Oral Daily  . sucralfate  1 g Oral TID WC & HS   Continuous Infusions: . sodium chloride 75 mL/hr at 03/08/15 0100    Active Problems:   Essential hypertension   Abdominal pain   General weakness   Hypertension   Slurred speech    Time spent: 30 min  Janece Canterbury  [facility] Pager [phone]. If 7PM-7AM, please contact night-coverage at [email or web address], password [facility] 03/08/2015, 8:17 AM

## 2015-03-08 NOTE — Telephone Encounter (Signed)
Pt daughter called and canceled appt for 03-10-25 due to her mother being in patient  Notified the referring dr by epic

## 2015-03-08 NOTE — Consult Note (Signed)
ASked to see [name] by Dr. Sheran Fava for palliative consult for for hospice care.  Impression: 1. Failure to Thrive - no evidence of stroke, infection or other clear etiology 2. B12 deficiency 3. Approaching end of life  Recommendations: 1. B12 replacement 2. Non-tele bed 3. Hospice referral - concern not to jeopardize CAPS application of in home aide services. Has been a patient of [facility] in the past.   HPI Mrs. [name] is a 81 y.o. year-old female with history of diverticulosis, GIB, HTN, dementia, GERD, CAD, protein calorie malnutrition who presents with lethargy, weakness, no desire to eat or drink today, slurred speech, and inability to swallow. The patient was last at their baseline health two days ago. 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 She ambulates with a rolling walker and assistance and is oriented to place when she is at home, but not when she leaves home, and not oriented to time. She recognizes family members and can have short conversations, but requires assistance with ADLs. Yesterday, she was increasingly tired and lay in bed most of the day. She did eat but only with encouragement. Other than fatigue, she had no other symptoms yesterday such as fevers, chills, nausea, vomiting, diarrhea, cough, SOB, slurred speech, focal weakness. This morning, she was somewhat difficult to arouse, her speech was slurred and slow and she had difficulty swallowing. Family denies facial droop or weakness of an arm or leg. They encouraged her to eat but she was unwilling or unable. She was brought to the ER for examination. According to the family, she was able to stand up with assistance and walk with assistance and her rolling walker to get into the car to come to the hospital. She did not take her normal medications today due to illness and confusion.   In the ER, VS notable for BP 204/92. WBC  3.8, hemoglobin at baseline 10.6mg /dl. Na 146. UA, CXR, and head CT negative. She initially was unable to tolerate sips of water but during her ER stay, was able to drink a few sips of water from a straw. She was started on normal saline. At the time of my examination, her speech was back to normal and she was sipping without difficulty. She c/o abdominal pain, particularly in the RLQ. She is s/p appendectomy but could have diverticulitis. MRI brain and CT abd/pelvis are pending.  Eval is negative to date: normal lab except for B12 deficiency, imaging unremarkable except for age related changes.  Per daughter patient has been more withdrawn over the past weeks, forgetful and agitated. Her PCP, Dr. Jenny Reichmann, prescribed seroquel for agitation and made neuro referral. Care giver has not started seroquel. Neurology appt for Thursday June 23rd  Discussed with daugther end of life care: patient is DNR/DNI, daughter agrees [name] would not want Tube feeding or IVF as life sustaining measure. Preference is to return home with assistance. Daughter works during the day and has CNA who sits 1100 - 1530. Agreeable to hospice referral.   Scheduled Meds:  antiseptic oral rinse  7 mL Mouth Rinse q12n4p   aspirin  300 mg Rectal Daily   Or   aspirin  325 mg Oral Daily   chlorhexidine  15 mL Mouth Rinse BID   feeding supplement (ENSURE ENLIVE)  237 mL Oral BID BM   feeding supplement (RESOURCE BREEZE)  1 Container Oral TID BM  heparin  5,000 Units Subcutaneous 3 times per day   isosorbide mononitrate  30 mg Oral Daily   levothyroxine  88 mcg Oral QAC breakfast   lisinopril  10 mg Oral Daily   magnesium chloride  2 tablet Oral BID   polyethylene glycol  17 g Oral Daily   potassium chloride  10 mEq Intravenous Q1 Hr x 2   sertraline  25 mg Oral Daily   sucralfate  1 g Oral TID WC & HS   Continuous Infusions:  dextrose 5 % and 0.45 % NaCl with KCl 20 mEq/L     PRN Meds:.hydrALAZINE,  nitroGLYCERIN, polyethylene glycol, polyvinyl alcohol, promethazine **OR** promethazine, sodium chloride  Past Medical History  Diagnosis Date   Internal hemorrhoid 07/2012   Diverticulosis of colon (without mention of hemorrhage) 07/2012   Herpes zoster    Glaucoma    Lumbar back pain    Hypertension    Cardiomyopathy    Hypothyroid    Myocardial infarction    History of blood transfusion     "related to diverticulitis" (12/02/2013)   GERD (gastroesophageal reflux disease)    Arthritis     "joints" (12/02/2013)   Depression    Anxiety    Adenomatous colon polyp 07/2012    sessile cecal polyp.  no high grade dysplasia.    Hyponatremia 07/2012    Na as low as 114.    Protein calorie malnutrition 07/2012    BMI then: 16.5   Zenker diverticulum 07/2012    this and esophageal dysmotility on esophagram.    Psychosis    Behavior disturbance     Past Surgical History  Procedure Laterality Date   Thyroidectomy     Appendectomy  2005    ruptured appendix   Tubal ligation     Colonoscopy  07/19/2012    Procedure: COLONOSCOPY;  Surgeon: Ladene Artist, MD,FACG;  Location: [facility];  Service: Endoscopy;  Laterality: N/A;   Cataract extraction w/ intraocular lens  implant, bilateral Bilateral    Glaucoma surgery Right     History   Social History   Marital Status: Widowed    Spouse Name: N/A   Number of Children: laarge extended family   Years of Education: N/A   Social History Main Topics   Smoking status: Never Smoker    Smokeless tobacco: Never Used   Alcohol Use: No   Drug Use: No   Sexual Activity: No   Other Topics Concern   None   Social History Narrative   She remains independent. She has a DNR established but does want medical care.       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  She ambulates with a rolling walker and assistance and  is oriented to place when she is at home, but not when she leaves home, and not oriented to time.  She recognizes family members and can have short conversations, but requires assistance with ADLs.      Palliative Care Status - DNR/DNI  Palliative Prophylaxis - on Miralax  PE:  Filed Vitals:   03/08/15 0800  BP:   Pulse:   Temp: 97.8 F (36.6 C)  Resp:    General: very elderly thin and frail appearing black woman in no distress HEENT- arcus senilis, Cor - 2+ radial pulse, RRR, no murmur, no JVD Pul - normal respirations Abd - scaphoid, BS +,  no guarding Neuro - unresponsive to voice and gentle sternal rub.   CBC Latest Ref Rng 03/08/2015 06-11-2015 01/06/2015  WBC 4.0 - 10.5 K/uL 5.3 3.8(L) 4.3  Hemoglobin 12.0 - 15.0 g/dL 11.7(L) 10.6(L) 9.6(L)  Hematocrit 36.0 - 46.0 % 36.6 34.0(L) 30.1(L)  Platelets 150 - 400 K/uL 193 177 176.0    CMP Latest Ref Rng 03/08/2015 06-11-2015 01/06/2015  Glucose 65 - 99 mg/dL 114(H) 97 95  BUN 6 - 20 mg/dL 18 20 23   Creatinine 0.44 - 1.00 mg/dL 0.90 0.90 1.10  Sodium 135 - 145 mmol/L 143 146(H) 141  Potassium 3.5 - 5.1 mmol/L 3.2(L) 4.0 3.8  Chloride 101 - 111 mmol/L 103 106 104  CO2 22 - 32 mmol/L 26 31 33(H)  Calcium 8.9 - 10.3 mg/dL 9.3 9.2 9.2  Total Protein 6.5 - 8.1 g/dL - 7.1 6.8  Total Bilirubin 0.3 - 1.2 mg/dL - 1.1 0.6  Alkaline Phos 38 - 126 U/L - 78 84  AST 15 - 41 U/L - 20 18  ALT 14 - 54 U/L - 10(L) 8   B12 - 166  Imaging CXR 6/19 - Neg CT Brain 6/19 no acute changes,  CT abd/pelvis  6/19 - no acute changes MRI Brain 6/20 - IMPRESSION: 1. No acute intracranial abnormality. 2. Advanced atrophy and diffuse white matter disease compatible with chronic microvascular ischemic change. 3. Remote lacunar infarcts of the basal ganglia bilaterally.  Thanks for the consult  Greater than 50% encounter spent on counseling.  Time in  0905 Time out  [place], MD, [place] Team [phone]

## 2015-03-08 NOTE — Progress Notes (Signed)
Initial Nutrition Assessment  DOCUMENTATION CODES:  Severe malnutrition in context of chronic illness  INTERVENTION: - Order in place for Ensure Enlive po BID, each supplement provides 350 kcal and 20 grams of protein - RD will continue to monitor for needs  NUTRITION DIAGNOSIS:  Inadequate oral intake related to acute illness as evidenced by per patient/family report.  GOAL:  Patient will meet greater than or equal to 90% of their needs  MONITOR:  Diet advancement, Weight trends, Labs, I & O's  REASON FOR ASSESSMENT:  Consult Assessment of nutrition requirement/status  ASSESSMENT: 81 y.o. year-old female with history of diverticulosis, GIB, HTN, dementia, GERD, CAD, protein calorie malnutrition who presents with lethargy, weakness, no desire to eat or drink today, slurred speech, and inability to swallow. The patient was last at their baseline health two days ago. Yesterday, she was increasingly tired and lay in bed most of the day. She did eat but only with encouragement.  Pt seen for consult. BMI indicates underweight status. Pt with hx of dementia and unable to provide information; all information provided by daughter. Pt usually eats breakfast which consists of items such as [place] sausage, eggs, and grits and then will later eat dinner; will sometimes eat lunch. She needs soft foods. Daughter provides pt with Ensure, especially when she does not want to eat.  Pt NPO since admission and unable to meet needs. Ensure order already in place. Physical assessment indicates severe muscle and fat wasting to all areas. Medications reviewed. Labs reviewed; K: 3.2 mmol/L, GFR: 60.  Height:  Ht Readings from Last 1 Encounters:  03/07/15 5\' 2"  (1.575 m)    Weight:  Wt Readings from Last 1 Encounters:  03/07/15 88 lb 6.5 oz (40.1 kg)    Ideal Body Weight:  50 kg (kg)  Wt Readings from Last 10 Encounters:  03/07/15 88 lb 6.5 oz (40.1 kg)  01/05/15 91 lb (41.277 kg)   10/20/14 89 lb (40.37 kg)  08/06/14 89 lb (40.37 kg)  06/11/14 98 lb (44.453 kg)  03/10/14 93 lb 12 oz (42.525 kg)  03/04/14 104 lb 8 oz (47.4 kg)  01/13/14 96 lb 4 oz (43.659 kg)  12/02/13 91 lb 11.4 oz (41.6 kg)  08/21/13 90 lb (40.824 kg)    BMI:  Body mass index is 16.17 kg/(m^2).  Estimated Nutritional Needs:  Kcal:  1000-1200  Protein:  45-55 grams  Fluid:  1.7 L/day  Skin:  Reviewed, no issues  Diet Order:  DIET SOFT Room service appropriate?: No; Fluid consistency:: Thin  EDUCATION NEEDS:  No education needs identified at this time   Intake/Output Summary (Last 24 hours) at 03/08/15 1024 Last data filed at 03/08/15 0600  Gross per 24 hour  Intake 1228.75 ml  Output      0 ml  Net 1228.75 ml    Last BM:  PTA   Jarome Matin, RD, LDN Inpatient Clinical Dietitian Pager # [phone] After hours/weekend pager # [phone]

## 2015-03-08 NOTE — Evaluation (Signed)
Occupational Therapy Evaluation Patient Details Name: [name] MRN: [id number] DOB: Oct 12, 1916 Today's Date: 03/08/2015    History of Present Illness Pt admitted through ED with lethargy/weakness and BP of 204/92; hx of dementia, MI, anxiety and depression   Clinical Impression   Pt admitted with above. She demonstrates the below listed deficits and will benefit from continued OT to maximize safety and independence with BADLs.  Pt presents to OT with long standing cognitive deficits, impaired balance, and decreased activity tolerance.   She requires mod - max A for ADLs and min A for functional mobility.  Recommend a trial of HHOT to determine if pt will be able to increase independence with ADLs and reduce burden of care.   Will follow acutely.       Follow Up Recommendations  Home health OT;Supervision/Assistance - 24 hour    Equipment Recommendations  None recommended by OT    Recommendations for Other Services       Precautions / Restrictions Precautions Precautions: Fall      Mobility Bed Mobility                  Transfers Overall transfer level: Needs assistance Equipment used: Rolling walker (2 wheeled) Transfers: Sit to/from [facility] Sit to Stand: Min assist Stand pivot transfers: Min assist       General transfer comment: verbal cues for hand placement and assist to power up into standing     Balance Overall balance assessment: Needs assistance Sitting-balance support: Feet supported Sitting balance-Leahy Scale: Fair     Standing balance support: Bilateral upper extremity supported Standing balance-Leahy Scale: Poor                              ADL Overall ADL's : Needs assistance/impaired Eating/Feeding: Moderate assistance;Sitting   Grooming: Wash/dry hands;Wash/dry face;Minimal assistance;Sitting   Upper Body Bathing: Maximal assistance;Sitting   Lower Body Bathing: Maximal assistance;Sit to/from  stand   Upper Body Dressing : Maximal assistance;Sitting   Lower Body Dressing: Total assistance;Sit to/from stand Lower Body Dressing Details (indicate cue type and reason): Pt unable to access feet to don/doff socks  Toilet Transfer: Minimal assistance;Ambulation;Comfort height toilet   Toileting- Clothing Manipulation and Hygiene: Maximal assistance;Sit to/from stand       Functional mobility during ADLs: Minimal assistance;Rolling walker General ADL Comments: Pt with decreased throughness and becomes distracted during task requiring assist      Vision     Perception     Praxis      Pertinent Vitals/Pain Pain Assessment: Faces Faces Pain Scale: No hurt     Hand Dominance     Extremity/Trunk Assessment Upper Extremity Assessment Upper Extremity Assessment: Generalized weakness   Lower Extremity Assessment Lower Extremity Assessment: Defer to PT evaluation   Cervical / Trunk Assessment Cervical / Trunk Assessment: Kyphotic   Communication Communication Communication: Expressive difficulties   Cognition Arousal/Alertness: Awake/alert Behavior During Therapy: WFL for tasks assessed/performed Overall Cognitive Status: History of cognitive impairments - at baseline                     General Comments       Exercises       Shoulder Instructions      Home Living Family/patient expects to be discharged to:: Private residence Living Arrangements: Children Available Help at Discharge: Family;Personal care attendant;Available 24 hours/day Type of Home: House       Home Layout:  One level               Home Equipment: [place] - 2 wheels;Bedside commode   Additional Comments: No family present, but per chart states Daughter is with pt at night and has a CNA staying with her during the day      Prior Functioning/Environment Level of Independence: Needs assistance  Gait / Transfers Assistance Needed: RW and assistance to walk ltd distance;   ADL's / Homemaking Assistance Needed: Pt unable to provide info and no family present        OT Diagnosis: Generalized weakness;Cognitive deficits   OT Problem List: Decreased strength;Decreased activity tolerance;Impaired balance (sitting and/or standing);Decreased cognition;Decreased safety awareness;Decreased knowledge of use of DME or AE;Decreased knowledge of precautions   OT Treatment/Interventions: Self-care/ADL training;DME and/or AE instruction;Therapeutic activities;Cognitive remediation/compensation;Patient/family education;Balance training    OT Goals(Current goals can be found in the care plan section) Acute Rehab OT Goals OT Goal Formulation: Patient unable to participate in goal setting Time For Goal Achievement: 03/22/15 Potential to Achieve Goals: Fair ADL Goals Pt Will Perform Grooming: with min assist;standing Pt Will Perform Upper Body Bathing: with min assist;sitting Pt Will Transfer to Toilet: with min guard assist;bedside commode;ambulating;regular height toilet;grab bars  OT Frequency: Min 2X/week   Barriers to D/C:            Co-evaluation              End of Session Equipment Utilized During Treatment: Rolling walker Nurse Communication: Mobility status  Activity Tolerance: Patient tolerated treatment well Patient left: in chair;with call bell/phone within reach;with nursing/sitter in room   Time: 2355-7322 OT Time Calculation (min): 26 min Charges:  OT General Charges $OT Visit: 1 Procedure OT Evaluation $Initial OT Evaluation Tier I: 1 Procedure OT Treatments $Therapeutic Activity: 8-22 mins G-Codes:    Renzo Vincelette M 2015-04-04, 3:55 PM

## 2015-03-08 NOTE — Evaluation (Signed)
Physical Therapy Evaluation Patient Details Name: [name] MRN: [id number] DOB: 1917-07-15 Today's Date: 03/08/2015   History of Present Illness  Pt admitted through ED with lethargy/weakness and BP of 204/92; hx of dementia, MI, anxiety and depression  Clinical Impression  Pt admitted as above and presenting with functional mobility limitations 2* generalized weakness and ambulatory balance deficits.  Pt and family plan to return home with 24/7 assist and HHPT dependent on acute stay progress.    Follow Up Recommendations Home health PT    Equipment Recommendations  None recommended by PT    Recommendations for Other Services OT consult     Precautions / Restrictions Precautions Precautions: Fall Restrictions Weight Bearing Restrictions: No      Mobility  Bed Mobility Overal bed mobility: Needs Assistance Bed Mobility: Supine to Sit     Supine to sit: HOB elevated;Mod assist     General bed mobility comments: Assist with pad to bring to EOB  Transfers Overall transfer level: Needs assistance Equipment used: Rolling walker (2 wheeled) Transfers: Sit to/from Stand Sit to Stand: Min assist;Mod assist;+2 safety/equipment         General transfer comment: cues for LE management and use of UEs to self assist  Ambulation/Gait Ambulation/Gait assistance: Min assist;Mod assist;+2 safety/equipment Ambulation Distance (Feet): 24 Feet Assistive device: Rolling walker (2 wheeled) Gait Pattern/deviations: Step-to pattern;Step-through pattern;Decreased step length - right;Decreased step length - left;Shuffle;Trunk flexed Gait velocity: decr   General Gait Details: cues for posture and position from RW.  Multiple short rests to complete task  Stairs            Wheelchair Mobility    Modified Rankin (Stroke Patients Only)       Balance Overall balance assessment: Needs assistance Sitting-balance support: No upper extremity supported Sitting  balance-Leahy Scale: Fair     Standing balance support: Bilateral upper extremity supported Standing balance-Leahy Scale: Poor                               Pertinent Vitals/Pain Pain Assessment: No/denies pain    Home Living Family/patient expects to be discharged to:: Private residence Living Arrangements: Children Available Help at Discharge: Family;Personal care attendant;Available 24 hours/day Type of Home: House       Home Layout: One level Home Equipment: [place] - 2 wheels;Bedside commode Additional Comments: Dtr present states she is with mother at night and has a CNA staying with her during the day    Prior Function Level of Independence: Needs assistance   Gait / Transfers Assistance Needed: RW and assistance to walk ltd distance;            Hand Dominance        Extremity/Trunk Assessment   Upper Extremity Assessment: Generalized weakness           Lower Extremity Assessment: Generalized weakness (Bil knee flex ltd to ~60 2* OA changes)      Cervical / Trunk Assessment: Kyphotic  Communication   Communication: Expressive difficulties  Cognition Arousal/Alertness: Awake/alert Behavior During Therapy: WFL for tasks assessed/performed Overall Cognitive Status: History of cognitive impairments - at baseline                      General Comments      Exercises        Assessment/Plan    PT Assessment Patient needs continued PT services  PT Diagnosis Difficulty walking   PT  Problem List Decreased strength;Decreased range of motion;Decreased activity tolerance;Decreased balance;Decreased mobility;Decreased cognition;Decreased knowledge of use of DME;Decreased safety awareness  PT Treatment Interventions DME instruction;Gait training;Stair training;Functional mobility training;Therapeutic activities;Therapeutic exercise;Patient/family education   PT Goals (Current goals can be found in the Care Plan section) Acute Rehab PT  Goals Patient Stated Goal: Home PT Goal Formulation: With patient/family Time For Goal Achievement: 03/22/15 Potential to Achieve Goals: Fair    Frequency Min 3X/week   Barriers to discharge        Co-evaluation               End of Session Equipment Utilized During Treatment: Gait belt Activity Tolerance: Patient tolerated treatment well;Patient limited by fatigue Patient left: in chair;with call bell/phone within reach;with family/visitor present Nurse Communication: Mobility status    Functional Assessment Tool Used: clinical judgement Functional Limitation: Mobility: Walking and moving around Mobility: Walking and Moving Around Current Status [phone]): At least 40 percent but less than 60 percent impaired, limited or restricted Mobility: Walking and Moving Around Goal Status [phone]): At least 1 percent but less than 20 percent impaired, limited or restricted    Time: 1000-1028 PT Time Calculation (min) (ACUTE ONLY): 28 min   Charges:   PT Evaluation $Initial PT Evaluation Tier I: 1 Procedure PT Treatments $Gait Training: 8-22 mins   PT G Codes:   PT G-Codes **NOT FOR INPATIENT CLASS** Functional Assessment Tool Used: clinical judgement Functional Limitation: Mobility: Walking and moving around Mobility: Walking and Moving Around Current Status ([id number]): At least 40 percent but less than 60 percent impaired, limited or restricted Mobility: Walking and Moving Around Goal Status [phone]): At least 1 percent but less than 20 percent impaired, limited or restricted    Donnald Tabar 03/08/2015, 1:11 PM

## 2015-03-08 NOTE — Evaluation (Signed)
Clinical/Bedside Swallow Evaluation Patient Details  Name: [name] MRN: [id number] Date of Birth: Apr 10, 1917  Today's Date: 03/08/2015 Time: SLP Start Time (ACUTE ONLY): 1121 SLP Stop Time (ACUTE ONLY): 1148 SLP Time Calculation (min) (ACUTE ONLY): 27 min  Past Medical History:  Past Medical History  Diagnosis Date  . Internal hemorrhoid 07/2012  . Diverticulosis of colon (without mention of hemorrhage) 07/2012  . Herpes zoster   . Glaucoma   . Lumbar back pain   . Hypertension   . Cardiomyopathy   . Hypothyroid   . Myocardial infarction   . History of blood transfusion     "related to diverticulitis" (12/02/2013)  . GERD (gastroesophageal reflux disease)   . Arthritis     "joints" (12/02/2013)  . Depression   . Anxiety   . Adenomatous colon polyp 07/2012    sessile cecal polyp.  no high grade dysplasia.   Marland Kitchen Hyponatremia 07/2012    Na as low as 114.   . Protein calorie malnutrition 07/2012    BMI then: 16.5  . Zenker diverticulum 07/2012    this and esophageal dysmotility on esophagram.   . Psychosis   . Behavior disturbance    Past Surgical History:  Past Surgical History  Procedure Laterality Date  . Thyroidectomy    . Appendectomy  2005    ruptured appendix  . Tubal ligation    . Colonoscopy  07/19/2012    Procedure: COLONOSCOPY;  Surgeon: Ladene Artist, MD,FACG;  Location: [facility];  Service: Endoscopy;  Laterality: N/A;  . Cataract extraction w/ intraocular lens  implant, bilateral Bilateral   . Glaucoma surgery Right    HPI:  81 y.o. year-old female with history of diverticulosis, Zenker's diverticulum, dilated esophagus with poor esophageal motility, GIB, HTN, dementia, GERD, CAD, protein calorie malnutrition who presents with lethargy, weakness, no desire to eat or drink, slurred speech, and inability to swallow.Per MD documentation, patient was at baseline health two days ago and lives at home with her daughter and has a CNA when daughter is at  work.CXR no active cardiopulmonary disease. Palliative care MD met with daughter who agrees [name] would not want Tube feeding.   Assessment / Plan / Recommendation Clinical Impression  Daughter reports cutting pt's food into small pieces using gravy at home. Suspect intermittent delayed swallow initiation and manual palpation of decreased laryngeal elevation. Vocal quality remained clear throughout without throat clear or cough. Currently on soft diet limiting choices, therefore diet upgraded to regular texture and pt's daughter will order foods that she can manipulate and tranist. Provided education for esophageal strategies including small bites/sips, alternate liquids and solids, sit upright during meals and remain up for minimum 45 minutes. No further needed a this time.     Aspiration Risk  Moderate    Diet Recommendation Thin (regular (with dtr ordering soft foods))   Medication Administration: Whole meds with puree Compensations: Slow rate;Small sips/bites (stay upright min 45 minutes after meals)    Other  Recommendations Oral Care Recommendations: Oral care BID   Follow Up Recommendations       Frequency and Duration        Pertinent Vitals/Pain none         Swallow Study           Oral/Motor/Sensory Function Overall Oral Motor/Sensory Function: Appears within functional limits for tasks assessed   Ice Chips Ice chips: Not tested   Thin Liquid Thin Liquid: Impaired Presentation: Cup;Straw Oral Phase Impairments: Impaired anterior to  posterior transit Oral Phase Functional Implications: Oral holding Pharyngeal  Phase Impairments: Decreased hyoid-laryngeal movement;Suspected delayed Swallow    Nectar Thick Nectar Thick Liquid: Not tested   Honey Thick Honey Thick Liquid: Not tested   Puree Puree: Impaired Pharyngeal Phase Impairments: Decreased hyoid-laryngeal movement   Solid   GO Functional Assessment Tool Used:  (skilled clinical judgement) Functional  Limitations: Swallowing Swallow Current Status (O7703): At least 20 percent but less than 40 percent impaired, limited or restricted Swallow Goal Status [phone]): At least 20 percent but less than 40 percent impaired, limited or restricted Swallow Discharge Status [phone]): At least 20 percent but less than 40 percent impaired, limited or restricted  Solid: Impaired Oral Phase Impairments: Reduced lingual movement/coordination Oral Phase Functional Implications:  (prolonged mastication, manipulation) Pharyngeal Phase Impairments: Decreased hyoid-laryngeal movement       Houston Siren 03/08/2015,12:07 PM  Orbie Pyo Colvin Caroli.Ed [employer] [phone]

## 2015-03-09 DIAGNOSIS — E43 Unspecified severe protein-calorie malnutrition: Secondary | ICD-10-CM

## 2015-03-09 DIAGNOSIS — M545 Low back pain: Secondary | ICD-10-CM | POA: Diagnosis not present

## 2015-03-09 DIAGNOSIS — R531 Weakness: Secondary | ICD-10-CM | POA: Insufficient documentation

## 2015-03-09 DIAGNOSIS — E860 Dehydration: Secondary | ICD-10-CM | POA: Diagnosis not present

## 2015-03-09 DIAGNOSIS — K219 Gastro-esophageal reflux disease without esophagitis: Secondary | ICD-10-CM | POA: Diagnosis not present

## 2015-03-09 DIAGNOSIS — R109 Unspecified abdominal pain: Secondary | ICD-10-CM | POA: Diagnosis not present

## 2015-03-09 DIAGNOSIS — F0391 Unspecified dementia with behavioral disturbance: Secondary | ICD-10-CM

## 2015-03-09 DIAGNOSIS — F03918 Unspecified dementia, unspecified severity, with other behavioral disturbance: Secondary | ICD-10-CM

## 2015-03-09 DIAGNOSIS — R4781 Slurred speech: Secondary | ICD-10-CM | POA: Diagnosis not present

## 2015-03-09 DIAGNOSIS — I10 Essential (primary) hypertension: Secondary | ICD-10-CM | POA: Diagnosis not present

## 2015-03-09 DIAGNOSIS — R627 Adult failure to thrive: Secondary | ICD-10-CM | POA: Diagnosis not present

## 2015-03-09 DIAGNOSIS — R1031 Right lower quadrant pain: Secondary | ICD-10-CM | POA: Diagnosis not present

## 2015-03-09 LAB — BASIC METABOLIC PANEL
Anion gap: 9 (ref 5–15)
BUN: 20 mg/dL (ref 6–20)
CO2: 27 mmol/L (ref 22–32)
CREATININE: 0.95 mg/dL (ref 0.44–1.00)
Calcium: 9.2 mg/dL (ref 8.9–10.3)
Chloride: 105 mmol/L (ref 101–111)
GFR calc Af Amer: 57 mL/min — ABNORMAL LOW (ref >60)
GFR calc non Af Amer: 49 mL/min — ABNORMAL LOW (ref >60)
GLUCOSE: 105 mg/dL — AB (ref 65–99)
Potassium: 3.8 mmol/L (ref 3.5–5.1)
Sodium: 141 mmol/L (ref 135–145)

## 2015-03-09 LAB — CBC
HCT: 31.9 % — ABNORMAL LOW (ref 36.0–46.0)
HEMOGLOBIN: 10.1 g/dL — AB (ref 12.0–15.0)
MCH: 27.2 pg (ref 26.0–34.0)
MCHC: 31.7 g/dL (ref 30.0–36.0)
MCV: 85.8 fL (ref 78.0–100.0)
PLATELETS: 172 10*3/uL (ref 150–400)
RBC: 3.72 MIL/uL — AB (ref 3.87–5.11)
RDW: 16.5 % — ABNORMAL HIGH (ref 11.5–15.5)
WBC: 4.3 10*3/uL (ref 4.0–10.5)

## 2015-03-09 LAB — URINE CULTURE

## 2015-03-09 MED ORDER — DONEPEZIL HCL 5 MG PO TABS: 5.0000 mg | ORAL_TABLET | Freq: Every day | ORAL | Status: DC

## 2015-03-09 MED ORDER — BOOST / RESOURCE BREEZE PO LIQD: 1.0000 | Freq: Three times a day (TID) | ORAL | Status: DC

## 2015-03-09 MED ORDER — MAGNESIUM CHLORIDE 64 MG PO TBEC: 2.0000 | DELAYED_RELEASE_TABLET | Freq: Two times a day (BID) | ORAL | Status: DC

## 2015-03-09 MED ORDER — POLYETHYLENE GLYCOL 3350 17 GM/SCOOP PO POWD: 17.0000 g | Freq: Every day | ORAL | Status: DC

## 2015-03-09 MED ORDER — MIRTAZAPINE 15 MG PO TABS: 15.0000 mg | ORAL_TABLET | Freq: Every day | ORAL | Status: DC

## 2015-03-09 MED ORDER — VITAMIN B-12 1000 MCG PO TABS: 1000.0000 ug | ORAL_TABLET | Freq: Every day | ORAL | Status: DC

## 2015-03-09 NOTE — Progress Notes (Signed)
Daily Progress Note   Patient Name: [name]       Date: 03/09/2015 DOB: December 21, 1916  Age: 81 y.o. MRN#: [id number] Attending Physician: Janece Canterbury, MD Primary Care Physician: Cathlean Cower, MD Admit Date: 07-11-202016  Reason for Consultation/Follow-up: Establishing goals of care  Subjective: Awake and alert. Denies pain or discomfort. She is at her baseline. Eval has been negative. Interval Events: No new events reports. Patient has been moved to non-tele bed. Will d/c/ labs Length of Stay: none  Current Medications: Scheduled Meds:   antiseptic oral rinse  7 mL Mouth Rinse q12n4p   chlorhexidine  15 mL Mouth Rinse BID   feeding supplement (ENSURE ENLIVE)  237 mL Oral BID BM   feeding supplement (RESOURCE BREEZE)  1 Container Oral TID BM   heparin  5,000 Units Subcutaneous 3 times per day   isosorbide mononitrate  30 mg Oral Daily   levothyroxine  88 mcg Oral QAC breakfast   lisinopril  10 mg Oral Daily   magnesium chloride  2 tablet Oral BID   polyethylene glycol  17 g Oral Daily   sertraline  25 mg Oral Daily   sucralfate  1 g Oral TID WC & HS    Continuous Infusions:  dextrose 5 % and 0.45 % NaCl with KCl 20 mEq/L 50 mL/hr at 03/09/15 0751    PRN Meds: hydrALAZINE, nitroGLYCERIN, polyethylene glycol, polyvinyl alcohol, promethazine **OR** promethazine, sodium chloride  Palliative Performance Scale: 40%     Vital Signs: BP 187/83 mmHg   Pulse 85   Temp(Src) 98.6 F (37 C) (Oral)   Resp 17   Ht 5\' 2"  (1.575 m)   Wt 40.1 kg (88 lb 6.5 oz)   BMI 16.17 kg/m2   SpO2 100% SpO2: SpO2: 100 % O2 Device: O2 Device: Not Delivered O2 Flow Rate:    Intake/output summary:  Intake/Output Summary (Last 24 hours) at 03/09/15 0857 Last data filed at 03/08/15 1900  Gross per 24 hour  Intake 444.17 ml  Output      0 ml  Net 444.17 ml   LBM:   Baseline Weight: Weight: 40.1 kg (88 lb 6.5 oz) Most recent weight: Weight: 40.1 kg (88 lb 6.5 oz)  Physical  Exam: gen'l- thin elderly black woman awake alert and in good spirits Pul- normal respirations Cor- RRR              Additional Data Reviewed: Recent Labs     03/08/15  0345  03/09/15  0520  WBC  5.3  4.3  HGB  11.7*  10.1*  PLT  193  172  NA  143  141  BUN  18  20  CREATININE  0.90  0.95     Problem List:  Patient Active Problem List   Diagnosis Date Noted   Protein-calorie malnutrition, severe 03/08/2015   Hypertension 007-11-202016   Slurred speech 007-11-202016   General weakness 01/05/2015   Dark stools 10/20/2014   Psychosis 09/23/2014   Decubitus ulcer of buttock, stage 1 09/23/2014   Abdominal pain 08/06/2014   Insomnia 03/10/2014   Lower GI bleed 03/01/2014   Preventative health care 01/16/2014   Peripheral edema 01/16/2014   Dementia 12/02/2013   Altered mental state 12/02/2013   Delirium 12/02/2013   AKI (acute kidney injury) 08/10/2013   Adult failure to thrive 08/17/2012   End of life care 08/02/2012   Benign neoplasm of colon 07/19/2012   Acute lower GI bleeding 07/16/2012   Anemia associated with  acute blood loss 08/23/2011   Renal insufficiency, mild 06/28/2011   GERD (gastroesophageal reflux disease) 06/04/2011   Allergic rhinitis 06/04/2011   ANGINA, STABLE 12/24/2007   HERPES ZOSTER 11/27/2007   HYPOTHYROIDISM 11/27/2007   GLAUCOMA 11/27/2007   Essential hypertension 11/27/2007   CARDIOMYOPATHY 11/27/2007   INTERNAL HEMORRHOIDS 11/27/2007   DIVERTICULOSIS, COLON 11/27/2007   BACK PAIN, LUMBAR 11/27/2007   Other acquired absence of organ 11/27/2007     Palliative Care Assessment & Plan    Code Status:  DNR  Goals of Care:  Continued medical therapy. Home with hospice vs HH. She appears to be stable for discharge once final arrangements made.   Desire for further Chaplaincy support:no  3. Symptom Management:  No active symptoms  4. Palliative Prophylaxis:  Stool Softner: miralax on order  5.  Prognosis: > 6 - 12 months  5. Discharge Planning: Home with Hospice vs [facility] was discussed with patiernt  Thank you for allowing the Palliative Medicine Team to assist in the care of this patient.   Time In: 0856 Time Out: 0904 Total Time 8 min Prolonged Time Billed  no        Adella Hare, MD, [place] [facility] Team  03/09/2015, 8:57 AM  Please contact Palliative Medicine Team phone at [phone] for questions and concerns.

## 2015-03-09 NOTE — Progress Notes (Signed)
Pt d/c instructions given.  No questions/concerns from pt or daughter.  Pt transported via wheelchair to private vehicle by NA and daughter.

## 2015-03-09 NOTE — Discharge Summary (Signed)
Physician Discharge Summary  [name] KDX:[id number] DOB: 02/21/1917 DOA: 2020/04/1415  PCP: Cathlean Cower, MD  Admit date: 2020/04/1415 Discharge date: 03/09/2015  Recommendations for Outpatient Follow-up:  1. Home with family care and home health RN, PT, OT, and SW 2. Encouraged family to use remeron and aricept 3. Started vitamin b12 supplementation (likely dietary deficit) and supplements  Discharge Diagnoses:  Principal Problem:   Dehydration Active Problems:   Essential hypertension   Abdominal pain   General weakness   Hypertension   Slurred speech   Protein-calorie malnutrition, severe   Dementia with behavioral disturbance   Discharge Condition: fair stable  Diet recommendation:  Thin (regular (with dtr ordering soft foods))   Medication Administration: Whole meds with puree Compensations: Slow rate;Small sips/bites (stay upright min 45 minutes after   Wt Readings from Last 3 Encounters:  03/07/15 40.1 kg (88 lb 6.5 oz)  01/05/15 41.277 kg (91 lb)  10/20/14 40.37 kg (89 lb)    History of present illness:  The patient is a 81 y.o. year-old female with history of diverticulosis, GIB, HTN, dementia, GERD, CAD, protein calorie malnutrition who presents with lethargy, weakness, no desire to eat or drink, slurred speech, and inability to swallow. She was brought to the ER for examination. According to the family, she was able to stand up with assistance and walk with assistance and her rolling walker to get into the car to come to the hospital. In the ER, VS notable for BP 204/92. Na 146. UA, CXR, and head CT negative. She initially was unable to tolerate sips of water but during her ER stay, was able to drink a few sips of water from a straw. She was started on normal saline. At the time of my examination, her speech was back to normal and she was sipping without difficulty. She c/o abdominal pain, particularly in the RLQ, but CT was negative for diverticulitis or acute  abnormality.  Hospital Course:   Generalized weakness, slurred speech, dysphagia due to dehydration and dementia.  There was no sign of infection on labs, UA, or CXR. - MRI brain negative for stroke, but demonstrated advanced atrophy and diffuse white matter disease and remote lacunar infarcts - RN stroke swallow: Failed but then passed swallowing evaluation by speech therapist - PT/OT evaluation:  Recommend home health services - Daily aspirin   Hypertension, may be related to pain, stress, illness - continue lisinopril at current dose  - Prn hydralazine for now  Abdominal pain, likely related to dehydation - LFts and lipase wnl - UA neg - CT ab/pelvis negative for acute abnormality  Prolonged QTc - Avoid QTc prolonging medications - Magnesium: 1.4. Started oral magnesium supplementation  Dementia with psychosis - Frequent reorientation - Neurology appointment scheduled for a few days from now - Continue zoloft -  Encouraged family to use the remeron and aricept  GERD, stable, continue carafate  CAD, chest pain free, no ischemic changes on ECG - not on asa, statin, or bb per PCP - continue imdur and prn ntg  Hypothyroidism, TSH 0.75 01/06/2015 - Continue synthroid  Malnutrition with stable weight - Continue regular diet and supplements - Nutrition consultation  Hypernatremia due to dehydration, resolved with IVF  Vitamin B12 deficiency anemia - Iron studies wnl, B12 166, folate 37 - Occult stool not obtained because not a candidate for work up for colon malignancy, no obvious rectal bleeding -  Started vitamin B12 supplementation  Hypokalemia due to malnutrition - Started potassium supplementation  Consultants:  Palliative care  Procedures: CT abd/pelvis: No acute abnormality MRI brain Antibiotics:  none  Discharge Exam: Filed Vitals:   03/09/15 1430  BP: 173/84  Pulse: 85  Temp: 98.3 F (36.8 C)  Resp: 18   Filed  Vitals:   03/08/15 1558 03/08/15 2043 03/09/15 0548 03/09/15 1430  BP: 161/85 155/92 187/83 173/84  Pulse: 93 90 85 85  Temp: 97.7 F (36.5 C) 99.2 F (37.3 C) 98.6 F (37 C) 98.3 F (36.8 C)  TempSrc: Oral Oral Oral Oral  Resp: 22 20 17 18   Height:      Weight:      SpO2: 100% 100% 100% 99%     General: Cachectic/frail female, No acute distress  HEENT: NCAT, MMM  Cardiovascular: RRR, nl S1, S2 no mrg, 2+ pulses, warm extremities  Respiratory: Course rales at bilateral bases, no wheezes or rhonchi, no increased WOB  Abdomen: NABS, scaphoid, soft, mild TTP  MSK: Normal tone and bulk, no LEE  Neuro: Grossly moves all extremities but continues to need additional prompting with the left extremities.  Discharge Instructions      Discharge Instructions    Call MD for:  difficulty breathing, headache or visual disturbances    Complete by:  As directed      Call MD for:  extreme fatigue    Complete by:  As directed      Call MD for:  hives    Complete by:  As directed      Call MD for:  persistant dizziness or light-headedness    Complete by:  As directed      Call MD for:  persistant nausea and vomiting    Complete by:  As directed      Call MD for:  severe uncontrolled pain    Complete by:  As directed      Call MD for:  temperature >100.4    Complete by:  As directed      Diet general    Complete by:  As directed      Discharge instructions    Complete by:  As directed   Ms. Sheets was hospitalized with confusion and dehydration which were probably caused by her dementia and not eating and drinking as much.  She did not have a recent stroke but her MRI shows severe brain changes associated with dementia.  Please read through the information about dementia provided to you.  I would encourage her to drink and eat small amounts constantly throughout the day and use supplements to increase her calories.  Dementia can mess up the wake-sleep cycle and remeron can  help keep her sleep cycle a little more normal.  It also boosts appetite a little.  I would try this medication along with the aricept to see if they help.  There are side effects, but what matters is whether SHE gets them and if they are worse than the benefits she gets from taking them.  You can always stop them if they are not working or if they cause intolerable side effects.  Talk about them with Dr. Judi Cong.  If she has worsening fevers, chills, difficulty breathing, nausea, vomiting, diarrhea, severe pain, or any other concerning symptoms, please seek immediate medical assistance or call 911.     Increase activity slowly    Complete by:  As directed             Medication List    STOP taking these medications  ketoconazole 2 % cream  Commonly known as:  NIZORAL     polyethylene glycol packet  Commonly known as:  MIRALAX / GLYCOLAX  Replaced by:  polyethylene glycol powder powder      TAKE these medications        CARAFATE 1 GM/10ML suspension  Generic drug:  sucralfate  TAKE 10 MLS (1 G TOTAL) BY MOUTH 4 (FOUR) TIMES DAILY - WITH MEALS AND AT BEDTIME.     donepezil 5 MG tablet  Commonly known as:  ARICEPT  Take 1 tablet (5 mg total) by mouth at bedtime.     feeding supplement (ENSURE COMPLETE) Liqd  Take 237 mLs by mouth 2 (two) times daily between meals. Strawberry.     feeding supplement (RESOURCE BREEZE) Liqd  Take 1 Container by mouth 3 (three) times daily between meals.     isosorbide mononitrate 30 MG 24 hr tablet  Commonly known as:  IMDUR  Take 1 tablet (30 mg total) by mouth daily.     levothyroxine 88 MCG tablet  Commonly known as:  SYNTHROID, LEVOTHROID  Take 1 tablet (88 mcg total) by mouth daily before breakfast.     lisinopril 10 MG tablet  Commonly known as:  PRINIVIL,ZESTRIL  TAKE 1 TABLET EVERY DAY     magnesium chloride 64 MG Tbec SR tablet  Commonly known as:  SLOW-MAG  Take 2 tablets (128 mg total) by mouth 2 (two) times daily.      mirtazapine 15 MG tablet  Commonly known as:  REMERON  Take 1 tablet (15 mg total) by mouth at bedtime.     nitroGLYCERIN 0.4 MG SL tablet  Commonly known as:  NITROSTAT  Place 1 tablet (0.4 mg total) under the tongue every 5 (five) minutes as needed. Chest pains     polyethylene glycol powder powder  Commonly known as:  MIRALAX  Take 17 g by mouth daily.     polyvinyl alcohol 1.4 % ophthalmic solution  Commonly known as:  LIQUIFILM TEARS  Place 1 drop into both eyes 2 (two) times daily as needed for dry eyes.     sertraline 25 MG tablet  Commonly known as:  ZOLOFT  Take 1 tablet (25 mg total) by mouth daily.     sodium chloride 0.65 % Soln nasal spray  Commonly known as:  OCEAN  Place 1 spray into both nostrils daily as needed for congestion.     vitamin B-12 1000 MCG tablet  Commonly known as:  CYANOCOBALAMIN  Take 1 tablet (1,000 mcg total) by mouth daily.       Follow-up Information    Follow up with Cathlean Cower, MD. Schedule an appointment as soon as possible for a visit in 2 weeks.   Specialties:  Internal Medicine, Radiology   Contact information:   [address] [place] [place] [address] [phone]       Follow up with [facility].   Why:  home health services   Contact information:   [address] [place] [address] [phone]        The results of significant diagnostics from this hospitalization (including imaging, microbiology, ancillary and laboratory) are listed below for reference.    Significant Diagnostic Studies: Dg Chest 2 View  04-08-202016   CLINICAL DATA:  Confusion and weakness for 2 days  EXAM: CHEST  2 VIEW  COMPARISON:  03/10/2014  FINDINGS: The heart is markedly enlarged. Central pulmonary vasculature is prominent with peripheral pruning.  No consolidation or mass. Hyperaeration. Stable thoracic spine with osteopenia.  IMPRESSION: No active cardiopulmonary disease.   Electronically Signed   By: Marybelle Killings M.D.   On: 07-28-2015 11:46   Ct Head Wo Contrast  2020-04-215   CLINICAL DATA:  Weakness  EXAM: CT HEAD WITHOUT CONTRAST  TECHNIQUE: Contiguous axial images were obtained from the base of the skull through the vertex without intravenous contrast.  COMPARISON:  12/02/2013  FINDINGS: Global atrophy. Chronic ischemic changes in the periventricular white matter and brainstem.  No mass effect, midline shift, or acute hemorrhage.  Left lens is absent likely due to postoperative change.  Mastoid air cells are clear.  Visualized sinuses are clear.  IMPRESSION: No acute intracranial pathology.   Electronically Signed   By: Marybelle Killings M.D.   On: 07-28-2015 11:51   Mr Brain Wo Contrast  03/08/2015   CLINICAL DATA:  Generalized weakness, slurred speech and dysphagia. Known dementia.  EXAM: MRI HEAD WITHOUT CONTRAST  TECHNIQUE: Multiplanar, multiecho pulse sequences of the brain and surrounding structures were obtained without intravenous contrast.  COMPARISON:  CT head without contrast 07-28-2015  FINDINGS: No acute infarct, hemorrhage, or mass lesion is present. Advanced atrophy and diffuse white matter changes reflect the sequela of chronic microvascular ischemia. Remote lacunar infarcts are present in the basal ganglia bilaterally. There are remote lacunar infarcts of the thalami bilaterally. The ventricles are proportionate to the degree of atrophy.  Flow is present in the major intracranial arteries. The left lens replacement is noted. The globes and orbits are otherwise intact. Mild mucosal thickening is seen laterally in the right maxillary sinus. The remaining paranasal sinuses and the mastoid air cells are clear.  IMPRESSION: 1. No acute intracranial abnormality. 2. Advanced atrophy and diffuse white matter disease compatible with chronic microvascular ischemic change. 3. Remote lacunar infarcts of the basal ganglia bilaterally.   Electronically Signed   By: San Morelle M.D.   On: 03/08/2015 08:59    Ct Abdomen Pelvis W Contrast  2020-04-215   CLINICAL DATA:  The patient has refused eat or drink for the past 2 days. Weakness. Initial encounter.  EXAM: CT ABDOMEN AND PELVIS WITH CONTRAST  TECHNIQUE: Multidetector CT imaging of the abdomen and pelvis was performed using the standard protocol following bolus administration of intravenous contrast.  CONTRAST:  80 mL OMNIPAQUE IOHEXOL 300 MG/ML  SOLN  COMPARISON:  CT abdomen and pelvis 07/16/2012.  FINDINGS: Mild dependent atelectasis is seen lung bases. There is cardiomegaly. No pleural or pericardial effusion.  Small bilateral renal cysts are seen and unchanged. A few small hepatic cysts are also unchanged. The spleen, adrenal glands and pancreas are unremarkable. There is extensive aortoiliac atherosclerosis without aneurysm. Uterus, adnexa and urinary bladder appear normal.  The stomach and small and large bowel appear normal. The appendix is unremarkable. There is no lymphadenopathy or fluid.  No focal bony abnormality is identified. There is some degenerative change about the hips and symphysis pubis. Bones appear osteopenic.  IMPRESSION: No acute finding abdomen or pelvis.  Atherosclerotic vascular disease.  Osteopenia.   Electronically Signed   By: Inge Rise M.D.   On: 07-28-2015 16:56    Microbiology: Recent Results (from the past 240 hour(s))  Urine culture     Status: None   Collection Time: 03/07/15 10:30 AM  Result Value Ref Range Status   Specimen Description URINE, CATHETERIZED  Final   Special Requests NONE  Final   Culture   Final  MULTIPLE SPECIES PRESENT, SUGGEST RECOLLECTION IF CLINICALLY INDICATED Performed at [facility]    Report Status 03/09/2015 FINAL  Final  MRSA PCR Screening     Status: None   Collection Time: 03/07/15  7:00 PM  Result Value Ref Range Status   MRSA by PCR NEGATIVE NEGATIVE Final    Comment:        The GeneXpert MRSA Assay (FDA approved for NASAL specimens only), is one component  of a comprehensive MRSA colonization surveillance program. It is not intended to diagnose MRSA infection nor to guide or monitor treatment for MRSA infections.      Labs: Basic Metabolic Panel:  Recent Labs Lab 03/07/15 1157 03/08/15 0345 03/09/15 0520  NA 146* 143 141  K 4.0 3.2* 3.8  CL 106 103 105  CO2 31 26 27   GLUCOSE 97 114* 105*  BUN 20 18 20   CREATININE 0.90 0.90 0.95  CALCIUM 9.2 9.3 9.2  MG  --  1.4*  --    Liver Function Tests:  Recent Labs Lab 03/07/15 1157  AST 20  ALT 10*  ALKPHOS 78  BILITOT 1.1  PROT 7.1  ALBUMIN 3.7   No results for input(s): LIPASE, AMYLASE in the last 168 hours. No results for input(s): AMMONIA in the last 168 hours. CBC:  Recent Labs Lab 03/07/15 1157 03/08/15 0345 03/09/15 0520  WBC 3.8* 5.3 4.3  NEUTROABS 2.1  --   --   HGB 10.6* 11.7* 10.1*  HCT 34.0* 36.6 31.9*  MCV 86.7 86.1 85.8  PLT 177 193 172   Cardiac Enzymes:  Recent Labs Lab 03/07/15 1157  TROPONINI <0.03   BNP: BNP (last 3 results) No results for input(s): BNP in the last 8760 hours.  ProBNP (last 3 results) No results for input(s): PROBNP in the last 8760 hours.  CBG: No results for input(s): GLUCAP in the last 168 hours.  Time coordinating discharge: 35 minutes  Signed:  Conchetta Lamia  [facility] 03/09/2015, 3:06 PM

## 2015-03-09 NOTE — Care Management Note (Addendum)
Case Management Note  Patient Details  Name: [name] MRN: [id number] Date of Birth: 1917/01/05  Subjective/Objective:                  81 yo female admitted with hypernatremia  Action/Plan: Received call from patient's other daughter Charlett Nose and she stated that she just got off work and is on her way to the hospital and she wanted to confirm the plan for hh services in the home. Explained that her sister Vaughan Basta stated she wanted [facility] to provide the [facility] services and the MD has ordered RN,PT,OT, and SW to assist with any available community resources. Charlett Nose was appreciative of help and had no other questions or concerns.  Spoke with patient daughter Vaughan Basta and she stated that the patient has had hospice in the past but she is not ready for hospice again and they would like [facility] services upon discharge. Vaughan Basta stated that the patient has personal care services in the house every day for 3 hours and they are in the process of trying to increase the daily hours to 8. Vaughan Basta stated that they have a walker, cane, wheelchair, BSC, and shower chair, and they do not need any other DME. Vaughan Basta chose [facility] for [facility] services and the referral was communicated to [facility] rep, Cyril Mourning. No other needs at this time. Patient is unable to provide information. Left v/m message for daughter Charlett Nose, await return call regarding home health versus hospice care at home.  Expected Discharge Date:                  Expected Discharge Plan:     In-House Referral:     Discharge planning Services  CM Consult  Post Acute Care Choice:    Choice offered to:     DME Arranged:    DME Agency:     HH Arranged:    [place] Agency:     Status of Service:     Medicare Important Message Given:    Date Medicare IM Given:    Medicare IM give by:    Date Additional Medicare IM Given:    Additional Medicare Important Message give by:     If discussed at [place] of Stay Meetings, dates discussed:    Additional Comments:  Scot Dock, RN 03/09/2015, 10:36 AM

## 2015-03-10 ENCOUNTER — Emergency Department (HOSPITAL_COMMUNITY)
Admission: EM | Admit: 2015-03-10 | Discharge: 2015-03-10 | Disposition: A | Discharge status: Home / Self Care | Payer: Commercial Managed Care - HMO | Attending: Emergency Medicine | Admitting: Emergency Medicine

## 2015-03-10 ENCOUNTER — Encounter (HOSPITAL_COMMUNITY): Payer: Self-pay | Admitting: *Deleted

## 2015-03-10 ENCOUNTER — Telehealth: Payer: Self-pay

## 2015-03-10 DIAGNOSIS — F329 Major depressive disorder, single episode, unspecified: Secondary | ICD-10-CM | POA: Insufficient documentation

## 2015-03-10 DIAGNOSIS — Z8619 Personal history of other infectious and parasitic diseases: Secondary | ICD-10-CM | POA: Diagnosis not present

## 2015-03-10 DIAGNOSIS — I10 Essential (primary) hypertension: Secondary | ICD-10-CM | POA: Insufficient documentation

## 2015-03-10 DIAGNOSIS — F419 Anxiety disorder, unspecified: Secondary | ICD-10-CM | POA: Diagnosis not present

## 2015-03-10 DIAGNOSIS — Z8669 Personal history of other diseases of the nervous system and sense organs: Secondary | ICD-10-CM | POA: Insufficient documentation

## 2015-03-10 DIAGNOSIS — E039 Hypothyroidism, unspecified: Secondary | ICD-10-CM | POA: Diagnosis not present

## 2015-03-10 DIAGNOSIS — F0390 Unspecified dementia without behavioral disturbance: Secondary | ICD-10-CM | POA: Diagnosis not present

## 2015-03-10 DIAGNOSIS — I252 Old myocardial infarction: Secondary | ICD-10-CM | POA: Insufficient documentation

## 2015-03-10 DIAGNOSIS — Z8739 Personal history of other diseases of the musculoskeletal system and connective tissue: Secondary | ICD-10-CM | POA: Insufficient documentation

## 2015-03-10 DIAGNOSIS — Z8719 Personal history of other diseases of the digestive system: Secondary | ICD-10-CM | POA: Insufficient documentation

## 2015-03-10 DIAGNOSIS — M545 Low back pain: Secondary | ICD-10-CM | POA: Diagnosis not present

## 2015-03-10 DIAGNOSIS — R40243 Glasgow coma scale score 3-8: Secondary | ICD-10-CM | POA: Diagnosis not present

## 2015-03-10 DIAGNOSIS — Z79899 Other long term (current) drug therapy: Secondary | ICD-10-CM | POA: Diagnosis not present

## 2015-03-10 DIAGNOSIS — Z86018 Personal history of other benign neoplasm: Secondary | ICD-10-CM | POA: Insufficient documentation

## 2015-03-10 DIAGNOSIS — R4182 Altered mental status, unspecified: Secondary | ICD-10-CM

## 2015-03-10 NOTE — ED Provider Notes (Signed)
CSN: [id number]     Arrival date & time 03/10/15  1135 History   First MD Initiated Contact with Patient 03/10/15 1222     Chief Complaint  Patient presents with  . Altered Mental Status     (Consider location/radiation/quality/duration/timing/severity/associated sxs/prior Treatment) HPI  A LEVEL 5 CAVEAT PERTAINS DUE TO DEMENTIA Pt was recently discharged yesterday from a hospital admission.  She has been somewhat weaker since getting home, was started on remeron for sleep.  Daughter gave this last night and this morning home health aid called daughter multiple times as patient was difficult to wake up.  Upon my evaluation in the ED patient is back to her baseline.  She has been eating and drinking well at home.  Daughter feels this is her baseline.    Past Medical History  Diagnosis Date  . Internal hemorrhoid 07/2012  . Diverticulosis of colon (without mention of hemorrhage) 07/2012  . Herpes zoster   . Glaucoma   . Lumbar back pain   . Hypertension   . Cardiomyopathy   . Hypothyroid   . Myocardial infarction   . History of blood transfusion     "related to diverticulitis" (12/02/2013)  . GERD (gastroesophageal reflux disease)   . Arthritis     "joints" (12/02/2013)  . Depression   . Anxiety   . Adenomatous colon polyp 07/2012    sessile cecal polyp.  no high grade dysplasia.   Marland Kitchen Hyponatremia 07/2012    Na as low as 114.   . Protein calorie malnutrition 07/2012    BMI then: 16.5  . Zenker diverticulum 07/2012    this and esophageal dysmotility on esophagram.   . Psychosis   . Behavior disturbance    Past Surgical History  Procedure Laterality Date  . Thyroidectomy    . Appendectomy  2005    ruptured appendix  . Tubal ligation    . Colonoscopy  07/19/2012    Procedure: COLONOSCOPY;  Surgeon: Ladene Artist, MD,FACG;  Location: [facility];  Service: Endoscopy;  Laterality: N/A;  . Cataract extraction w/ intraocular lens  implant, bilateral Bilateral   . Glaucoma  surgery Right    Family History  Problem Relation Age of Onset  . Colon cancer Neg Hx   . Stroke Daughter   . Heart attack    . Dementia Sister    History  Substance Use Topics  . Smoking status: Never Smoker   . Smokeless tobacco: Never Used  . Alcohol Use: No   OB History    No data available     Review of Systems  UNABLE TO OBTAIN ROS DUE TO LEVEL 5 CAVEAT    Allergies  Review of patient's allergies indicates no known allergies.  Home Medications   Prior to Admission medications   Medication Sig Start Date End Date Taking? Authorizing Provider  CARAFATE 1 GM/10ML suspension TAKE 10 MLS (1 G TOTAL) BY MOUTH 4 (FOUR) TIMES DAILY - WITH MEALS AND AT BEDTIME. 11/25/14  Yes Biagio Borg, MD  donepezil (ARICEPT) 5 MG tablet Take 1 tablet (5 mg total) by mouth at bedtime. 03/09/15  Yes Janece Canterbury, MD  feeding supplement, ENSURE COMPLETE, (ENSURE COMPLETE) LIQD Take 237 mLs by mouth 2 (two) times daily between meals. Strawberry. 09/10/12  Yes Neena Rhymes, MD  feeding supplement, RESOURCE BREEZE, (RESOURCE BREEZE) LIQD Take 1 Container by mouth 3 (three) times daily between meals. Patient not taking: Reported on 03/12/2015 03/09/15  Yes Janece Canterbury, MD  isosorbide  mononitrate (IMDUR) 30 MG 24 hr tablet Take 1 tablet (30 mg total) by mouth daily. 05/19/14  Yes Biagio Borg, MD  levothyroxine (SYNTHROID, LEVOTHROID) 88 MCG tablet Take 1 tablet (88 mcg total) by mouth daily before breakfast. 04/02/14  Yes Biagio Borg, MD  lisinopril (PRINIVIL,ZESTRIL) 10 MG tablet TAKE 1 TABLET EVERY DAY Patient taking differently: TAKE 10 MG BY MOUTH DAILY. 04/06/14  Yes Biagio Borg, MD  nitroGLYCERIN (NITROSTAT) 0.4 MG SL tablet Place 1 tablet (0.4 mg total) under the tongue every 5 (five) minutes as needed. Chest pains 10/20/14  Yes Biagio Borg, MD  polyethylene glycol powder [facility]) powder Take 17 g by mouth daily. 03/09/15  Yes Janece Canterbury, MD  polyvinyl alcohol (LIQUIFILM TEARS) 1.4  % ophthalmic solution Place 1 drop into both eyes 2 (two) times daily as needed for dry eyes.   Yes Historical Provider, MD  sertraline (ZOLOFT) 25 MG tablet Take 1 tablet (25 mg total) by mouth daily. 05/28/14  Yes Biagio Borg, MD  sodium chloride (OCEAN) 0.65 % SOLN nasal spray Place 1 spray into both nostrils daily as needed for congestion.   Yes Historical Provider, MD  vitamin B-12 (CYANOCOBALAMIN) 1000 MCG tablet Take 1 tablet (1,000 mcg total) by mouth daily. 03/09/15  Yes Janece Canterbury, MD  magnesium chloride (SLOW-MAG) 64 MG TBEC SR tablet Take 2 tablets (128 mg total) by mouth 2 (two) times daily. Patient not taking: Reported on 03/10/2015 03/09/15   Janece Canterbury, MD   BP 176/77 mmHg  Pulse 67  Temp(Src) 98.3 F (36.8 C) (Oral)  Resp 18  SpO2 98%  Vitals reviewed Physical Exam  Physical Examination: General appearance - alert, chronically ill appearing, and in no distress Mental status - alert, oriented to person, not to place or time Eyes - pupils equal and reactive, extraocular eye movements intact Mouth - mucous membranes moist, pharynx normal without lesions Chest - clear to auscultation, no wheezes, rales or rhonchi, symmetric air entry Heart - normal rate, regular rhythm, normal S1, S2, no murmurs, rubs, clicks or gallops Abdomen - soft, nontender, nondistended, no masses or organomegaly Neurological - alert, oriented x 1, normal speech, moves all extremities, unable to follow command for more detailed neuro exam Extremities - peripheral pulses normal, no pedal edema, no clubbing or cyanosis Skin - normal coloration and turgor, no rashes  ED Course  Procedures (including critical care time) Labs Review Labs Reviewed - No data to display  Imaging Review No results found.   EKG Interpretation None      MDM   Final diagnoses:  Altered mental status, unspecified altered mental status type  Dementia, without behavioral disturbance    Pt presenting with  decreased responsiveness this morning- she is back to her baseline now.  Pt was discharged from hospital yesterday, she was given remeron last night for sleep.  She was very somnolent this morning and difficult to wake up, however, once awake, was back to her baseline.  I have discussed her presentation at length with her daughter, do not feel more workup will be helpful today- however, advised to decrease dose of remeron in half and to take earlier in the evening so that she will be in more wakeful state the next morning.  Daughter is agreeable to try this plan and will f/u with her PMD    Alfonzo Beers, MD 03/16/15 [phone]

## 2015-03-10 NOTE — Progress Notes (Addendum)
[facility] ED CM received a call from Unit Cm, Seth Bake after she receive a call from pt's daughter about pt returning to [facility] ED Pt recently d/c home with advanced home care services vs hospice services.  All family per unit Cm were not in agreeance to hospice at time of d/c.   1334 CM reviewed case with EDP, Linker who reports pt now awake and doing well in [facility] ED Pt for d/c home from [facility] ED. Reports pt/family reports "they were working on hospice"  Discussed pt was d/c home with advanced home care services vs hospice services.  All family per unit Cm were not in agreeance to hospice at time of d/c.   1349 [place] contacted who agreed to continue to offer hospice at home to pt and family

## 2015-03-10 NOTE — Telephone Encounter (Signed)
Spoke to pt dtr: Dtr made appt to see Dr. Jenny Reichmann on 03/16/15 at 3 pm.   Transition Care Management Follow-up Telephone Call Pt was dc'ed on 03/09/15   How have you been since you were released from the hospital? Dtr states that pt has been sleeping all day and that it is difficult to wake her up, pt does not want to eat.    Do you understand why you were in the hospital? dtr states yes   Do you understand the discharge instrcutions? dtr states yes  Items Reviewed:  Medications reviewed: reviewed with dtr  Allergies reviewed: reviewed with dtr  Dietary changes reviewed: no diet changes were made. Pt does not want to stay away long enough to eat.   Referrals reviewed: n/a   Functional Questionnaire:   Activities of Daily Living (ADLs):   She states they are independent in the following:  no  States they require assistance with the following: yes   Any transportation issues/concerns?: no   Any patient concerns? Altered mental status   Confirmed importance and date/time of follow-up visits scheduled: yes Dr. Jenny Reichmann on 03/16/15 at 3 pm.   Confirmed with patient if condition begins to worsen call PCP or go to the ER.  yes

## 2015-03-10 NOTE — Discharge Instructions (Signed)
Return to the ED with any concerns including vomiting, difficulty breathing, decreased level of alertness/lethargy, or any other alarming symptoms

## 2015-03-10 NOTE — ED Notes (Signed)
Per EMS pt coming from home with c/o unresponsiveness, family sts pt was d/c'd from hospital yesterday, this morning she was at her baseline, however as the day progressed family reported pt fell asleep and they couldn't wake her up. Pt is responding to pain only at this time.

## 2015-03-10 NOTE — ED Notes (Signed)
Bed: YZ70 Expected date:  Expected time:  Means of arrival:  Comments: Ems- 81 yo F, treated yesterday for dehydration. Wont verbally respond, pulls away from pain

## 2015-03-10 NOTE — ED Notes (Signed)
Patient is awake now responding and talking with daughter. Daughter states that patient started a new medication for dementia and took a sleep aid last night.

## 2015-03-10 NOTE — ED Notes (Signed)
Patient drinking water 

## 2015-03-11 ENCOUNTER — Ambulatory Visit: Payer: Medicare HMO | Admitting: Neurology

## 2015-03-11 DIAGNOSIS — E43 Unspecified severe protein-calorie malnutrition: Secondary | ICD-10-CM | POA: Diagnosis not present

## 2015-03-11 DIAGNOSIS — K5790 Diverticulosis of intestine, part unspecified, without perforation or abscess without bleeding: Secondary | ICD-10-CM | POA: Diagnosis not present

## 2015-03-11 DIAGNOSIS — M545 Low back pain: Secondary | ICD-10-CM | POA: Diagnosis not present

## 2015-03-11 DIAGNOSIS — K219 Gastro-esophageal reflux disease without esophagitis: Secondary | ICD-10-CM | POA: Diagnosis not present

## 2015-03-11 DIAGNOSIS — I429 Cardiomyopathy, unspecified: Secondary | ICD-10-CM | POA: Diagnosis not present

## 2015-03-11 DIAGNOSIS — I10 Essential (primary) hypertension: Secondary | ICD-10-CM | POA: Diagnosis not present

## 2015-03-11 DIAGNOSIS — I2510 Atherosclerotic heart disease of native coronary artery without angina pectoris: Secondary | ICD-10-CM | POA: Diagnosis not present

## 2015-03-11 DIAGNOSIS — F0391 Unspecified dementia with behavioral disturbance: Secondary | ICD-10-CM | POA: Diagnosis not present

## 2015-03-12 ENCOUNTER — Encounter (HOSPITAL_COMMUNITY): Payer: Self-pay | Admitting: Emergency Medicine

## 2015-03-12 ENCOUNTER — Emergency Department (HOSPITAL_COMMUNITY)
Admission: EM | Admit: 2015-03-12 | Discharge: 2015-03-13 | Disposition: A | Discharge status: Home / Self Care | Payer: Commercial Managed Care - HMO | Attending: Emergency Medicine | Admitting: Emergency Medicine

## 2015-03-12 DIAGNOSIS — E039 Hypothyroidism, unspecified: Secondary | ICD-10-CM | POA: Insufficient documentation

## 2015-03-12 DIAGNOSIS — M545 Low back pain: Secondary | ICD-10-CM | POA: Diagnosis not present

## 2015-03-12 DIAGNOSIS — R40241 Glasgow coma scale score 13-15: Secondary | ICD-10-CM | POA: Diagnosis not present

## 2015-03-12 DIAGNOSIS — I10 Essential (primary) hypertension: Secondary | ICD-10-CM | POA: Diagnosis not present

## 2015-03-12 DIAGNOSIS — T50995A Adverse effect of other drugs, medicaments and biological substances, initial encounter: Secondary | ICD-10-CM | POA: Diagnosis not present

## 2015-03-12 DIAGNOSIS — Z8619 Personal history of other infectious and parasitic diseases: Secondary | ICD-10-CM | POA: Insufficient documentation

## 2015-03-12 DIAGNOSIS — Z8719 Personal history of other diseases of the digestive system: Secondary | ICD-10-CM | POA: Insufficient documentation

## 2015-03-12 DIAGNOSIS — I2510 Atherosclerotic heart disease of native coronary artery without angina pectoris: Secondary | ICD-10-CM | POA: Diagnosis not present

## 2015-03-12 DIAGNOSIS — K5790 Diverticulosis of intestine, part unspecified, without perforation or abscess without bleeding: Secondary | ICD-10-CM | POA: Diagnosis not present

## 2015-03-12 DIAGNOSIS — I252 Old myocardial infarction: Secondary | ICD-10-CM | POA: Insufficient documentation

## 2015-03-12 DIAGNOSIS — Z86010 Personal history of colonic polyps: Secondary | ICD-10-CM | POA: Insufficient documentation

## 2015-03-12 DIAGNOSIS — F329 Major depressive disorder, single episode, unspecified: Secondary | ICD-10-CM | POA: Insufficient documentation

## 2015-03-12 DIAGNOSIS — Z8669 Personal history of other diseases of the nervous system and sense organs: Secondary | ICD-10-CM | POA: Insufficient documentation

## 2015-03-12 DIAGNOSIS — Z8739 Personal history of other diseases of the musculoskeletal system and connective tissue: Secondary | ICD-10-CM | POA: Insufficient documentation

## 2015-03-12 DIAGNOSIS — Z79899 Other long term (current) drug therapy: Secondary | ICD-10-CM | POA: Insufficient documentation

## 2015-03-12 DIAGNOSIS — F419 Anxiety disorder, unspecified: Secondary | ICD-10-CM | POA: Diagnosis not present

## 2015-03-12 DIAGNOSIS — R55 Syncope and collapse: Secondary | ICD-10-CM | POA: Diagnosis not present

## 2015-03-12 DIAGNOSIS — T43025A Adverse effect of tetracyclic antidepressants, initial encounter: Secondary | ICD-10-CM | POA: Insufficient documentation

## 2015-03-12 DIAGNOSIS — I429 Cardiomyopathy, unspecified: Secondary | ICD-10-CM | POA: Diagnosis not present

## 2015-03-12 DIAGNOSIS — T50905A Adverse effect of unspecified drugs, medicaments and biological substances, initial encounter: Secondary | ICD-10-CM

## 2015-03-12 DIAGNOSIS — K219 Gastro-esophageal reflux disease without esophagitis: Secondary | ICD-10-CM | POA: Diagnosis not present

## 2015-03-12 DIAGNOSIS — E43 Unspecified severe protein-calorie malnutrition: Secondary | ICD-10-CM | POA: Diagnosis not present

## 2015-03-12 DIAGNOSIS — F0391 Unspecified dementia with behavioral disturbance: Secondary | ICD-10-CM | POA: Diagnosis not present

## 2015-03-12 NOTE — ED Notes (Addendum)
Per EMS, pt. From home with complaint of near syncope which reported by daughter at around 0930 this evening, per daughter to EMS that pt. Was using the bathroom and noted to be so weak to get up the potty   . Pt. Was still using the bathroom upon EMS arrival, no diarrhea reported. Pt. Has hx of dementia, pt. Was seen here 3 days ago for dehydration, pt. Is alert  And oriented x2. No SOB. Pt. Has DNR form .

## 2015-03-13 DIAGNOSIS — F329 Major depressive disorder, single episode, unspecified: Secondary | ICD-10-CM | POA: Diagnosis not present

## 2015-03-13 DIAGNOSIS — R55 Syncope and collapse: Secondary | ICD-10-CM | POA: Diagnosis not present

## 2015-03-13 DIAGNOSIS — Z86010 Personal history of colonic polyps: Secondary | ICD-10-CM | POA: Diagnosis not present

## 2015-03-13 DIAGNOSIS — E039 Hypothyroidism, unspecified: Secondary | ICD-10-CM | POA: Diagnosis not present

## 2015-03-13 DIAGNOSIS — I252 Old myocardial infarction: Secondary | ICD-10-CM | POA: Diagnosis not present

## 2015-03-13 DIAGNOSIS — T43025A Adverse effect of tetracyclic antidepressants, initial encounter: Secondary | ICD-10-CM | POA: Diagnosis not present

## 2015-03-13 DIAGNOSIS — I10 Essential (primary) hypertension: Secondary | ICD-10-CM | POA: Diagnosis not present

## 2015-03-13 DIAGNOSIS — F419 Anxiety disorder, unspecified: Secondary | ICD-10-CM | POA: Diagnosis not present

## 2015-03-13 DIAGNOSIS — Z79899 Other long term (current) drug therapy: Secondary | ICD-10-CM | POA: Diagnosis not present

## 2015-03-13 LAB — COMPREHENSIVE METABOLIC PANEL
ALT: 10 U/L — ABNORMAL LOW (ref 14–54)
AST: 23 U/L (ref 15–41)
Albumin: 3.2 g/dL — ABNORMAL LOW (ref 3.5–5.0)
Alkaline Phosphatase: 65 U/L (ref 38–126)
Anion gap: 8 (ref 5–15)
BILIRUBIN TOTAL: 0.6 mg/dL (ref 0.3–1.2)
BUN: 24 mg/dL — ABNORMAL HIGH (ref 6–20)
CHLORIDE: 108 mmol/L (ref 101–111)
CO2: 25 mmol/L (ref 22–32)
CREATININE: 1.09 mg/dL — AB (ref 0.44–1.00)
Calcium: 9 mg/dL (ref 8.9–10.3)
GFR calc Af Amer: 48 mL/min — ABNORMAL LOW (ref >60)
GFR calc non Af Amer: 41 mL/min — ABNORMAL LOW (ref >60)
GLUCOSE: 123 mg/dL — AB (ref 65–99)
Potassium: 4.1 mmol/L (ref 3.5–5.1)
Sodium: 141 mmol/L (ref 135–145)
TOTAL PROTEIN: 6.3 g/dL — AB (ref 6.5–8.1)

## 2015-03-13 LAB — URINALYSIS, ROUTINE W REFLEX MICROSCOPIC
Bilirubin Urine: NEGATIVE
Glucose, UA: NEGATIVE mg/dL
HGB URINE DIPSTICK: NEGATIVE
Ketones, ur: NEGATIVE mg/dL
Nitrite: NEGATIVE
PROTEIN: NEGATIVE mg/dL
SPECIFIC GRAVITY, URINE: 1.015 (ref 1.005–1.030)
UROBILINOGEN UA: 0.2 mg/dL (ref 0.0–1.0)
pH: 6 (ref 5.0–8.0)

## 2015-03-13 LAB — CBC
HEMATOCRIT: 33 % — AB (ref 36.0–46.0)
Hemoglobin: 10.3 g/dL — ABNORMAL LOW (ref 12.0–15.0)
MCH: 27.1 pg (ref 26.0–34.0)
MCHC: 31.2 g/dL (ref 30.0–36.0)
MCV: 86.8 fL (ref 78.0–100.0)
PLATELETS: 173 10*3/uL (ref 150–400)
RBC: 3.8 MIL/uL — AB (ref 3.87–5.11)
RDW: 16.3 % — ABNORMAL HIGH (ref 11.5–15.5)
WBC: 5.9 10*3/uL (ref 4.0–10.5)

## 2015-03-13 LAB — URINE MICROSCOPIC-ADD ON

## 2015-03-13 NOTE — ED Notes (Signed)
EKG given to EDP, Otter,MD. For review. 

## 2015-03-13 NOTE — Discharge Instructions (Signed)
The patient's workup today has not shown a specific cause for symptoms.  It is thought that [name] had syncopal episode associated with having a bowel movement.  As per our discussion, it is recommended to stop the Remeron as it is seems to be causing oversedation.  Please follow-up with her primary care doctor.  Return to the emergency department for worsening condition or new concerning symptoms.   Polypharmacy Problems Polypharmacy problems can occur when you take more than one medicine. Your caregivers need to know about all the medicines you take. This includes vitamins, herbs, eyedrops, over-the-counter medicines, prescription medicines, and creams. Drug interaction problems can include:  Bad reactions or side effects. This can occur when certain drugs are taken together.  Reduced benefit from your medicines. This can occur if one drug reduces the ability of another drug to work. Some drug interaction problems can be life-threatening. Your caregivers can coordinate a plan to help you avoid polypharmacy problems. RISK FACTORS Polypharmacy problems are more likely to occur if:  You have many caregivers prescribing different medicines for you. This is a common problem for elderly patients.  You have a long-term (chronic) medical condition.  You have had an organ transplant.  You have human immunodeficiency virus (HIV).  You are undergoing chemotherapy.  You have hepatitis.  You have kidney disease or kidney failure.  You have significant heart disease or lung disease.  You are elderly.  You are taking medicines that have a low margin of error. This means there is little difference between the right amount of medicine and too much medicine. Medicines in this category include:  Warfarin.  Digoxin.  Lithium.  Theophylline.  Monoamine oxidase (MAO) inhibitors.  Seizure medicines.  Cyclosporine. PREVENTION   Choose one caregiver to be in charge of coordinating your  medicines. Inform this caregiver about all medicines and supplements you take, even if they are prescribed by another caregiver.  Purchase all your medicines through the same pharmacy. The pharmacy keeps track of your medicines and possible drug interactions. They can notify your caregiver of potential problems.  Read the information given to you at your pharmacy.  Carry a list of all your medicines and their doses with you. This informs your caregivers, emergency department caregivers, and specialists about the medicines you are taking. This is especially important before you start a new medicine.  Use a system to keep track of which medicines you are taking and when. Many patients use a pillbox that separates medicines by the day and time they are supposed to be taken.  Carry a list of your chronic medical problems with you. Conditions such as kidney problems, liver problems, organ transplants, and chronic viral infections affect the way your body handles medicines.  Ask your caregiver or pharmacist if you have any questions about your medicines. SEEK MEDICAL CARE IF:  You have any problems that may be caused by your medicines.  You have chest pain.  You have shortness of breath.  You have an irregular heartbeat.  You have fainting spells.  You have shaking or tremors.  You have weakness or tiredness (lethargy).  You have a rash or swelling in any part of the body.  You have increased bleeding, rectal bleeding, vaginal bleeding, or you bruise easily.  You have abdominal pain.  You have nausea.  You have vomiting. Document Released: 10/12/2004 Document Revised: 11/27/2011 Document Reviewed: 05/24/2011 [facility] Patient Information 2015 [place], [place]. This information is not intended to replace advice given to you by  your health care provider. Make sure you discuss any questions you have with your health care provider.  Syncope Syncope is a medical term for fainting or  passing out. This means you lose consciousness and drop to the ground. People are generally unconscious for less than 5 minutes. You may have some muscle twitches for up to 15 seconds before waking up and returning to normal. Syncope occurs more often in older adults, but it can happen to anyone. While most causes of syncope are not dangerous, syncope can be a sign of a serious medical problem. It is important to seek medical care.  CAUSES  Syncope is caused by a sudden drop in blood flow to the brain. The specific cause is often not determined. Factors that can bring on syncope include:  Taking medicines that lower blood pressure.  Sudden changes in posture, such as standing up quickly.  Taking more medicine than prescribed.  Standing in one place for too long.  Seizure disorders.  Dehydration and excessive exposure to heat.  Low blood sugar (hypoglycemia).  Straining to have a bowel movement.  Heart disease, irregular heartbeat, or other circulatory problems.  Fear, emotional distress, seeing blood, or severe pain. SYMPTOMS  Right before fainting, you may:  Feel dizzy or light-headed.  Feel nauseous.  See all white or all black in your field of vision.  Have cold, clammy skin. DIAGNOSIS  Your health care provider will ask about your symptoms, perform a physical exam, and perform an electrocardiogram (ECG) to record the electrical activity of your heart. Your health care provider may also perform other heart or blood tests to determine the cause of your syncope which may include:  Transthoracic echocardiogram (TTE). During echocardiography, sound waves are used to evaluate how blood flows through your heart.  Transesophageal echocardiogram (TEE).  Cardiac monitoring. This allows your health care provider to monitor your heart rate and rhythm in real time.  Holter monitor. This is a portable device that records your heartbeat and can help diagnose heart arrhythmias. It  allows your health care provider to track your heart activity for several days, if needed.  Stress tests by exercise or by giving medicine that makes the heart beat faster. TREATMENT  In most cases, no treatment is needed. Depending on the cause of your syncope, your health care provider may recommend changing or stopping some of your medicines. HOME CARE INSTRUCTIONS  Have someone stay with you until you feel stable.  Do not drive, use machinery, or play sports until your health care provider says it is okay.  Keep all follow-up appointments as directed by your health care provider.  Lie down right away if you start feeling like you might faint. Breathe deeply and steadily. Wait until all the symptoms have passed.  Drink enough fluids to keep your urine clear or pale yellow.  If you are taking blood pressure or heart medicine, get up slowly and take several minutes to sit and then stand. This can reduce dizziness. SEEK IMMEDIATE MEDICAL CARE IF:   You have a severe headache.  You have unusual pain in the chest, abdomen, or back.  You are bleeding from your mouth or rectum, or you have black or tarry stool.  You have an irregular or very fast heartbeat.  You have pain with breathing.  You have repeated fainting or seizure-like jerking during an episode.  You faint when sitting or lying down.  You have confusion.  You have trouble walking.  You have severe weakness.  You have vision problems. If you fainted, call your local emergency services (911 in [place] Do not drive yourself to the hospital.  MAKE SURE YOU:  Understand these instructions.  Will watch your condition.  Will get help right away if you are not doing well or get worse. Document Released: 09/04/2005 Document Revised: 09/09/2013 Document Reviewed: 11/03/2011 [facility] Patient Information 2015 [place], [place]. This information is not intended to replace advice given to you by your health care provider.  Make sure you discuss any questions you have with your health care provider.

## 2015-03-13 NOTE — ED Provider Notes (Signed)
CSN: [id number]     Arrival date & time 03/12/15  2232 History   First MD Initiated Contact with Patient 03/12/15 2319     Chief Complaint  Patient presents with  . Near Syncope     (Consider location/radiation/quality/duration/timing/severity/associated sxs/prior Treatment) HPI 81 year old female presents to emergency room via EMS with possible syncopal event.  Patient has history of dementia and is a poor historian.  Daughter gives history.  Patient was admitted earlier this week, diagnosed with dehydration and generalized weakness.  She was started on Remeron during that hospitalization.  Patient return to the emergency department after discharge due to increased somnolence since discharge.  Patient was discharged from the ED.  Patient has been doing what better today.  Upon daughter's arrival home at 3 PM she is eating multiple times, has been awake and alert and interactive.  She has been in her baseline.  Patient complained of mild abdominal cramping after eating peaches.  She needed to go to the restroom, after urinating and having a bowel movement.  Patient was noted to stare off into space and became unresponsive.  Patient is back to her baseline at arrival to the ER here. Past Medical History  Diagnosis Date  . Internal hemorrhoid 07/2012  . Diverticulosis of colon (without mention of hemorrhage) 07/2012  . Herpes zoster   . Glaucoma   . Lumbar back pain   . Hypertension   . Cardiomyopathy   . Hypothyroid   . Myocardial infarction   . History of blood transfusion     "related to diverticulitis" (12/02/2013)  . GERD (gastroesophageal reflux disease)   . Arthritis     "joints" (12/02/2013)  . Depression   . Anxiety   . Adenomatous colon polyp 07/2012    sessile cecal polyp.  no high grade dysplasia.   Marland Kitchen Hyponatremia 07/2012    Na as low as 114.   . Protein calorie malnutrition 07/2012    BMI then: 16.5  . Zenker diverticulum 07/2012    this and esophageal dysmotility on  esophagram.   . Psychosis   . Behavior disturbance    Past Surgical History  Procedure Laterality Date  . Thyroidectomy    . Appendectomy  2005    ruptured appendix  . Tubal ligation    . Colonoscopy  07/19/2012    Procedure: COLONOSCOPY;  Surgeon: Ladene Artist, MD,FACG;  Location: [facility];  Service: Endoscopy;  Laterality: N/A;  . Cataract extraction w/ intraocular lens  implant, bilateral Bilateral   . Glaucoma surgery Right    Family History  Problem Relation Age of Onset  . Colon cancer Neg Hx   . Stroke Daughter   . Heart attack    . Dementia Sister    History  Substance Use Topics  . Smoking status: Never Smoker   . Smokeless tobacco: Never Used  . Alcohol Use: No   OB History    No data available     Review of Systems  Level V caveat, dementia  Allergies  Review of patient's allergies indicates no known allergies.  Home Medications   Prior to Admission medications   Medication Sig Start Date End Date Taking? Authorizing Provider  CARAFATE 1 GM/10ML suspension TAKE 10 MLS (1 G TOTAL) BY MOUTH 4 (FOUR) TIMES DAILY - WITH MEALS AND AT BEDTIME. 11/25/14  Yes Biagio Borg, MD  donepezil (ARICEPT) 5 MG tablet Take 1 tablet (5 mg total) by mouth at bedtime. 03/09/15  Yes Janece Canterbury, MD  feeding supplement, ENSURE COMPLETE, (ENSURE COMPLETE) LIQD Take 237 mLs by mouth 2 (two) times daily between meals. Strawberry. 09/10/12  Yes Neena Rhymes, MD  isosorbide mononitrate (IMDUR) 30 MG 24 hr tablet Take 1 tablet (30 mg total) by mouth daily. 05/19/14  Yes Biagio Borg, MD  levothyroxine (SYNTHROID, LEVOTHROID) 88 MCG tablet Take 1 tablet (88 mcg total) by mouth daily before breakfast. 04/02/14  Yes Biagio Borg, MD  lisinopril (PRINIVIL,ZESTRIL) 10 MG tablet TAKE 1 TABLET EVERY DAY Patient taking differently: TAKE 10 MG BY MOUTH DAILY. 04/06/14  Yes Biagio Borg, MD  nitroGLYCERIN (NITROSTAT) 0.4 MG SL tablet Place 1 tablet (0.4 mg total) under the tongue every 5  (five) minutes as needed. Chest pains 10/20/14  Yes Biagio Borg, MD  polyethylene glycol powder [facility]) powder Take 17 g by mouth daily. 03/09/15  Yes Janece Canterbury, MD  polyvinyl alcohol (LIQUIFILM TEARS) 1.4 % ophthalmic solution Place 1 drop into both eyes 2 (two) times daily as needed for dry eyes.   Yes Historical Provider, MD  sertraline (ZOLOFT) 25 MG tablet Take 1 tablet (25 mg total) by mouth daily. 05/28/14  Yes Biagio Borg, MD  sodium chloride (OCEAN) 0.65 % SOLN nasal spray Place 1 spray into both nostrils daily as needed for congestion.   Yes Historical Provider, MD  vitamin B-12 (CYANOCOBALAMIN) 1000 MCG tablet Take 1 tablet (1,000 mcg total) by mouth daily. 03/09/15  Yes Janece Canterbury, MD  feeding supplement, RESOURCE BREEZE, (RESOURCE BREEZE) LIQD Take 1 Container by mouth 3 (three) times daily between meals. Patient not taking: Reported on 03/12/2015 03/09/15   Janece Canterbury, MD  magnesium chloride (SLOW-MAG) 64 MG TBEC SR tablet Take 2 tablets (128 mg total) by mouth 2 (two) times daily. Patient not taking: Reported on 03/10/2015 03/09/15   Janece Canterbury, MD   BP 129/59 mmHg  Pulse 56  Temp(Src) 98.8 F (37.1 C) (Oral)  Resp 24  SpO2 100% Physical Exam  Constitutional: She appears well-developed and well-nourished.  Elderly female, no acute distress  HENT:  Head: Normocephalic and atraumatic.  Nose: Nose normal.  Mouth/Throat: Oropharynx is clear and moist.  Eyes: Conjunctivae and EOM are normal. Pupils are equal, round, and reactive to light.  Neck: Normal range of motion. Neck supple. No JVD present. No tracheal deviation present. No thyromegaly present.  Cardiovascular: Normal rate, regular rhythm, normal heart sounds and intact distal pulses.  Exam reveals no gallop and no friction rub.   No murmur heard. Pulmonary/Chest: Effort normal and breath sounds normal. No stridor. No respiratory distress. She has no wheezes. She has no rales. She exhibits no tenderness.   Abdominal: Soft. Bowel sounds are normal. She exhibits no distension and no mass. There is no tenderness. There is no rebound and no guarding.  Musculoskeletal: Normal range of motion. She exhibits no edema or tenderness.  Lymphadenopathy:    She has no cervical adenopathy.  Neurological: She is alert. She displays normal reflexes. No cranial nerve deficit. She exhibits normal muscle tone. Coordination normal.  Skin: Skin is warm and dry. No rash noted. No erythema. No pallor.  Psychiatric: She has a normal mood and affect. Her behavior is normal. Judgment and thought content normal.  Nursing note and vitals reviewed.   ED Course  Procedures (including critical care time) Labs Review Labs Reviewed  COMPREHENSIVE METABOLIC PANEL - Abnormal; Notable for the following:    Glucose, Bld 123 (*)    BUN 24 (*)  Creatinine, Ser 1.09 (*)    Total Protein 6.3 (*)    Albumin 3.2 (*)    ALT 10 (*)    GFR calc non Af Amer 41 (*)    GFR calc Af Amer 48 (*)    All other components within normal limits  URINALYSIS, ROUTINE W REFLEX MICROSCOPIC (NOT AT [facility]) - Abnormal; Notable for the following:    Leukocytes, UA SMALL (*)    All other components within normal limits  CBC - Abnormal; Notable for the following:    RBC 3.80 (*)    Hemoglobin 10.3 (*)    HCT 33.0 (*)    RDW 16.3 (*)    All other components within normal limits  URINE MICROSCOPIC-ADD ON    Imaging Review No results found.   EKG Interpretation   Date/Time:  Saturday March 13 2015 00:03:47 EDT Ventricular Rate:  57 PR Interval:  148 QRS Duration: 105 QT Interval:  562 QTC Calculation: 547 R Axis:   -39 Text Interpretation:  Sinus rhythm Ventricular premature complex Left  ventricular hypertrophy Prolonged QT interval No significant change since  Confirmed by Druscilla Petsch  MD, Jovane Foutz ([id number]) on 03/13/2015 12:24:47 AM      MDM   Final diagnoses:  Syncope  Vasovagal syncope  Medication side effect, initial encounter     81 year old female with possible syncopal event while having bowel movement.  Otherwise, she is back to her baseline.  Daughter is concerned as since being on Remeron.  She has been sleeping more.  Daughter has held her Remeron for the last 24 hours and feels that she is much improved.  Patient had an echocardiogram done a year ago.  Recent admission with workup unremarkable.  Daughter is comfortable taking her home.  I do not know that an admission would add much to her evaluation, and would set her up for a nosocomial infection.  Patient has follow-up with her doctor next week.  Linton Flemings, MD 03/13/15 [phone]

## 2015-03-15 DIAGNOSIS — M545 Low back pain: Secondary | ICD-10-CM | POA: Diagnosis not present

## 2015-03-16 ENCOUNTER — Ambulatory Visit (INDEPENDENT_AMBULATORY_CARE_PROVIDER_SITE_OTHER): Payer: Commercial Managed Care - HMO | Admitting: Internal Medicine

## 2015-03-16 ENCOUNTER — Encounter: Payer: Self-pay | Admitting: Internal Medicine

## 2015-03-16 VITALS — BP 148/70 | HR 66 | Temp 97.7°F

## 2015-03-16 DIAGNOSIS — I10 Essential (primary) hypertension: Secondary | ICD-10-CM | POA: Diagnosis not present

## 2015-03-16 DIAGNOSIS — G4700 Insomnia, unspecified: Secondary | ICD-10-CM | POA: Diagnosis not present

## 2015-03-16 DIAGNOSIS — K5790 Diverticulosis of intestine, part unspecified, without perforation or abscess without bleeding: Secondary | ICD-10-CM | POA: Diagnosis not present

## 2015-03-16 DIAGNOSIS — M545 Low back pain: Secondary | ICD-10-CM | POA: Diagnosis not present

## 2015-03-16 DIAGNOSIS — I429 Cardiomyopathy, unspecified: Secondary | ICD-10-CM | POA: Diagnosis not present

## 2015-03-16 DIAGNOSIS — F0391 Unspecified dementia with behavioral disturbance: Secondary | ICD-10-CM

## 2015-03-16 DIAGNOSIS — E43 Unspecified severe protein-calorie malnutrition: Secondary | ICD-10-CM | POA: Diagnosis not present

## 2015-03-16 DIAGNOSIS — K219 Gastro-esophageal reflux disease without esophagitis: Secondary | ICD-10-CM | POA: Diagnosis not present

## 2015-03-16 DIAGNOSIS — I2510 Atherosclerotic heart disease of native coronary artery without angina pectoris: Secondary | ICD-10-CM | POA: Diagnosis not present

## 2015-03-16 NOTE — Assessment & Plan Note (Signed)
To stop the remeron, apparently improved per daughter, consider otc melatonin

## 2015-03-16 NOTE — Patient Instructions (Signed)
OK to stop the remeron  Please take all new medication as prescribed - the aricept  Please continue all other medications as before, and refills have been done if requested.  Please have the pharmacy call with any other refills you may need.  Please continue your efforts at being more active, low cholesterol diet, and weight control.  Please keep your appointments with your specialists as you may have planned

## 2015-03-16 NOTE — Assessment & Plan Note (Signed)
Overall stable, to re-start the aricept,  to f/u any worsening symptoms or concerns

## 2015-03-16 NOTE — Progress Notes (Signed)
Subjective:    Patient ID: [name], female    DOB: [birth year], 81 y.o.   MRN: [id number]  HPI  Here to f/u recent hospn and ED visit; initially has next day somnolence with starting the remeron and not taking po well. Able to send home from hosp with [facility] RN/PT and SW, asked to res-tart the remeron , but then hads syncopal spell with BM in BR, sees in Ed, asked to stop the remeron. Now eating 3 meals per day, more alert.  Still with difficulty with getting sleep, willing to melatonin only, has been sleeping better since hospn.  Did have some nighttime hallucination last night per daughter, but went back to sleep, not agitated or combative.  Dementia overall stable symptomatically, and not assoc with behavioral changes such as hallucinations, paranoia, or agitation.  Wllling ot start the aricept again now. Duaghter with her taking care of her 24/7 and fmaly wont help, with some stress, but willing to continue  Past Medical History  Diagnosis Date  . Internal hemorrhoid 07/2012  . Diverticulosis of colon (without mention of hemorrhage) 07/2012  . Herpes zoster   . Glaucoma   . Lumbar back pain   . Hypertension   . Cardiomyopathy   . Hypothyroid   . Myocardial infarction   . History of blood transfusion     "related to diverticulitis" (12/02/2013)  . GERD (gastroesophageal reflux disease)   . Arthritis     "joints" (12/02/2013)  . Depression   . Anxiety   . Adenomatous colon polyp 07/2012    sessile cecal polyp.  no high grade dysplasia.   Marland Kitchen Hyponatremia 07/2012    Na as low as 114.   . Protein calorie malnutrition 07/2012    BMI then: 16.5  . Zenker diverticulum 07/2012    this and esophageal dysmotility on esophagram.   . Psychosis   . Behavior disturbance    Past Surgical History  Procedure Laterality Date  . Thyroidectomy    . Appendectomy  2005    ruptured appendix  . Tubal ligation    . Colonoscopy  07/19/2012    Procedure: COLONOSCOPY;  Surgeon: Ladene Artist,  MD,FACG;  Location: [facility];  Service: Endoscopy;  Laterality: N/A;  . Cataract extraction w/ intraocular lens  implant, bilateral Bilateral   . Glaucoma surgery Right     reports that she has never smoked. She has never used smokeless tobacco. She reports that she does not drink alcohol or use illicit drugs. family history includes Dementia in her sister; Heart attack in an other family member; Stroke in her daughter. There is no history of Colon cancer. No Known Allergies Current Outpatient Prescriptions on File Prior to Visit  Medication Sig Dispense Refill  . CARAFATE 1 GM/10ML suspension TAKE 10 MLS (1 G TOTAL) BY MOUTH 4 (FOUR) TIMES DAILY - WITH MEALS AND AT BEDTIME. 420 mL 3  . donepezil (ARICEPT) 5 MG tablet Take 1 tablet (5 mg total) by mouth at bedtime. 30 tablet 0  . feeding supplement, ENSURE COMPLETE, (ENSURE COMPLETE) LIQD Take 237 mLs by mouth 2 (two) times daily between meals. Strawberry.    . isosorbide mononitrate (IMDUR) 30 MG 24 hr tablet Take 1 tablet (30 mg total) by mouth daily. 30 tablet 11  . levothyroxine (SYNTHROID, LEVOTHROID) 88 MCG tablet Take 1 tablet (88 mcg total) by mouth daily before breakfast. 30 tablet 11  . lisinopril (PRINIVIL,ZESTRIL) 10 MG tablet TAKE 1 TABLET EVERY DAY (Patient taking differently: TAKE  10 MG BY MOUTH DAILY.) 30 tablet 11  . nitroGLYCERIN (NITROSTAT) 0.4 MG SL tablet Place 1 tablet (0.4 mg total) under the tongue every 5 (five) minutes as needed. Chest pains 30 tablet 6  . polyethylene glycol powder (MIRALAX) powder Take 17 g by mouth daily. 850 g 0  . polyvinyl alcohol (LIQUIFILM TEARS) 1.4 % ophthalmic solution Place 1 drop into both eyes 2 (two) times daily as needed for dry eyes.    Marland Kitchen sertraline (ZOLOFT) 25 MG tablet Take 1 tablet (25 mg total) by mouth daily. 30 tablet 11  . sodium chloride (OCEAN) 0.65 % SOLN nasal spray Place 1 spray into both nostrils daily as needed for congestion.    . vitamin B-12 (CYANOCOBALAMIN) 1000 MCG  tablet Take 1 tablet (1,000 mcg total) by mouth daily. 30 tablet 0  . feeding supplement, RESOURCE BREEZE, (RESOURCE BREEZE) LIQD Take 1 Container by mouth 3 (three) times daily between meals. (Patient not taking: Reported on 03/12/2015) 90 Container 0  . magnesium chloride (SLOW-MAG) 64 MG TBEC SR tablet Take 2 tablets (128 mg total) by mouth 2 (two) times daily. (Patient not taking: Reported on 03/10/2015) 120 tablet 0  . [DISCONTINUED] isosorbide dinitrate (ISORDIL) 30 MG tablet Take 30 mg by mouth 3 (three) times daily.      . [DISCONTINUED] ranitidine (ZANTAC) 150 MG tablet Take 150 mg by mouth 2 (two) times daily.       No current facility-administered medications on file prior to visit.     Review of Systems  unable due to dementia, daugther states no other concerns     Objective:   Physical Exam BP 148/70 mmHg  Pulse 66  Temp(Src) 97.7 F (36.5 C) (Oral)  SpO2 98% VS noted,  Constitutional: Pt appears in no significant distress HENT: Head: NCAT.  Right Ear: External ear normal.  Left Ear: External ear normal.  Eyes: . Pupils are equal, round, and reactive to light. Conjunctivae and EOM are normal Neck: Normal range of motion. Neck supple.  Cardiovascular: Normal rate and regular rhythm.   Pulmonary/Chest: Effort normal and breath sounds without rales or wheezing.  Abd:  Soft, NT, ND, + BS Neurological: Pt is alert. Not confused , motor grossly intact Skin: Skin is warm. No rash, no LE edema Psychiatric: Pt behavior is normal. No agitation.      Assessment & Plan:

## 2015-03-16 NOTE — Assessment & Plan Note (Signed)
stable overall by history and exam, recent data reviewed with pt, and pt to continue medical treatment as before,  to f/u any worsening symptoms or concerns BP Readings from Last 3 Encounters:  03/16/15 148/70  03/13/15 129/59  03/10/15 176/77

## 2015-03-17 DIAGNOSIS — M545 Low back pain: Secondary | ICD-10-CM | POA: Diagnosis not present

## 2015-03-17 DIAGNOSIS — I2510 Atherosclerotic heart disease of native coronary artery without angina pectoris: Secondary | ICD-10-CM | POA: Diagnosis not present

## 2015-03-17 DIAGNOSIS — K219 Gastro-esophageal reflux disease without esophagitis: Secondary | ICD-10-CM | POA: Diagnosis not present

## 2015-03-17 DIAGNOSIS — I429 Cardiomyopathy, unspecified: Secondary | ICD-10-CM | POA: Diagnosis not present

## 2015-03-17 DIAGNOSIS — I10 Essential (primary) hypertension: Secondary | ICD-10-CM | POA: Diagnosis not present

## 2015-03-17 DIAGNOSIS — F0391 Unspecified dementia with behavioral disturbance: Secondary | ICD-10-CM | POA: Diagnosis not present

## 2015-03-17 DIAGNOSIS — E43 Unspecified severe protein-calorie malnutrition: Secondary | ICD-10-CM | POA: Diagnosis not present

## 2015-03-17 DIAGNOSIS — K5790 Diverticulosis of intestine, part unspecified, without perforation or abscess without bleeding: Secondary | ICD-10-CM | POA: Diagnosis not present

## 2015-03-18 DIAGNOSIS — M545 Low back pain: Secondary | ICD-10-CM | POA: Diagnosis not present

## 2015-03-18 DIAGNOSIS — K5790 Diverticulosis of intestine, part unspecified, without perforation or abscess without bleeding: Secondary | ICD-10-CM | POA: Diagnosis not present

## 2015-03-18 DIAGNOSIS — F0391 Unspecified dementia with behavioral disturbance: Secondary | ICD-10-CM | POA: Diagnosis not present

## 2015-03-18 DIAGNOSIS — I10 Essential (primary) hypertension: Secondary | ICD-10-CM | POA: Diagnosis not present

## 2015-03-18 DIAGNOSIS — I429 Cardiomyopathy, unspecified: Secondary | ICD-10-CM | POA: Diagnosis not present

## 2015-03-18 DIAGNOSIS — E43 Unspecified severe protein-calorie malnutrition: Secondary | ICD-10-CM | POA: Diagnosis not present

## 2015-03-18 DIAGNOSIS — I2510 Atherosclerotic heart disease of native coronary artery without angina pectoris: Secondary | ICD-10-CM | POA: Diagnosis not present

## 2015-03-18 DIAGNOSIS — K219 Gastro-esophageal reflux disease without esophagitis: Secondary | ICD-10-CM | POA: Diagnosis not present

## 2015-03-19 DIAGNOSIS — I10 Essential (primary) hypertension: Secondary | ICD-10-CM | POA: Diagnosis not present

## 2015-03-19 DIAGNOSIS — I429 Cardiomyopathy, unspecified: Secondary | ICD-10-CM | POA: Diagnosis not present

## 2015-03-19 DIAGNOSIS — K5790 Diverticulosis of intestine, part unspecified, without perforation or abscess without bleeding: Secondary | ICD-10-CM | POA: Diagnosis not present

## 2015-03-19 DIAGNOSIS — M545 Low back pain: Secondary | ICD-10-CM | POA: Diagnosis not present

## 2015-03-19 DIAGNOSIS — F0391 Unspecified dementia with behavioral disturbance: Secondary | ICD-10-CM | POA: Diagnosis not present

## 2015-03-19 DIAGNOSIS — I2510 Atherosclerotic heart disease of native coronary artery without angina pectoris: Secondary | ICD-10-CM | POA: Diagnosis not present

## 2015-03-19 DIAGNOSIS — E43 Unspecified severe protein-calorie malnutrition: Secondary | ICD-10-CM | POA: Diagnosis not present

## 2015-03-19 DIAGNOSIS — K219 Gastro-esophageal reflux disease without esophagitis: Secondary | ICD-10-CM | POA: Diagnosis not present

## 2015-03-22 DIAGNOSIS — E43 Unspecified severe protein-calorie malnutrition: Secondary | ICD-10-CM | POA: Diagnosis not present

## 2015-03-22 DIAGNOSIS — K219 Gastro-esophageal reflux disease without esophagitis: Secondary | ICD-10-CM | POA: Diagnosis not present

## 2015-03-22 DIAGNOSIS — I429 Cardiomyopathy, unspecified: Secondary | ICD-10-CM | POA: Diagnosis not present

## 2015-03-22 DIAGNOSIS — F0391 Unspecified dementia with behavioral disturbance: Secondary | ICD-10-CM | POA: Diagnosis not present

## 2015-03-22 DIAGNOSIS — K5790 Diverticulosis of intestine, part unspecified, without perforation or abscess without bleeding: Secondary | ICD-10-CM | POA: Diagnosis not present

## 2015-03-22 DIAGNOSIS — I10 Essential (primary) hypertension: Secondary | ICD-10-CM | POA: Diagnosis not present

## 2015-03-22 DIAGNOSIS — M545 Low back pain: Secondary | ICD-10-CM | POA: Diagnosis not present

## 2015-03-22 DIAGNOSIS — I2510 Atherosclerotic heart disease of native coronary artery without angina pectoris: Secondary | ICD-10-CM | POA: Diagnosis not present

## 2015-03-23 DIAGNOSIS — K219 Gastro-esophageal reflux disease without esophagitis: Secondary | ICD-10-CM | POA: Diagnosis not present

## 2015-03-23 DIAGNOSIS — M545 Low back pain: Secondary | ICD-10-CM | POA: Diagnosis not present

## 2015-03-23 DIAGNOSIS — F0391 Unspecified dementia with behavioral disturbance: Secondary | ICD-10-CM | POA: Diagnosis not present

## 2015-03-23 DIAGNOSIS — K5790 Diverticulosis of intestine, part unspecified, without perforation or abscess without bleeding: Secondary | ICD-10-CM | POA: Diagnosis not present

## 2015-03-23 DIAGNOSIS — E43 Unspecified severe protein-calorie malnutrition: Secondary | ICD-10-CM | POA: Diagnosis not present

## 2015-03-23 DIAGNOSIS — I10 Essential (primary) hypertension: Secondary | ICD-10-CM | POA: Diagnosis not present

## 2015-03-23 DIAGNOSIS — I429 Cardiomyopathy, unspecified: Secondary | ICD-10-CM | POA: Diagnosis not present

## 2015-03-23 DIAGNOSIS — I2510 Atherosclerotic heart disease of native coronary artery without angina pectoris: Secondary | ICD-10-CM | POA: Diagnosis not present

## 2015-03-24 DIAGNOSIS — M545 Low back pain: Secondary | ICD-10-CM | POA: Diagnosis not present

## 2015-03-24 DIAGNOSIS — I2510 Atherosclerotic heart disease of native coronary artery without angina pectoris: Secondary | ICD-10-CM | POA: Diagnosis not present

## 2015-03-24 DIAGNOSIS — I429 Cardiomyopathy, unspecified: Secondary | ICD-10-CM | POA: Diagnosis not present

## 2015-03-24 DIAGNOSIS — I10 Essential (primary) hypertension: Secondary | ICD-10-CM | POA: Diagnosis not present

## 2015-03-24 DIAGNOSIS — E43 Unspecified severe protein-calorie malnutrition: Secondary | ICD-10-CM | POA: Diagnosis not present

## 2015-03-24 DIAGNOSIS — K219 Gastro-esophageal reflux disease without esophagitis: Secondary | ICD-10-CM | POA: Diagnosis not present

## 2015-03-24 DIAGNOSIS — F0391 Unspecified dementia with behavioral disturbance: Secondary | ICD-10-CM | POA: Diagnosis not present

## 2015-03-24 DIAGNOSIS — K5790 Diverticulosis of intestine, part unspecified, without perforation or abscess without bleeding: Secondary | ICD-10-CM | POA: Diagnosis not present

## 2015-03-25 ENCOUNTER — Other Ambulatory Visit: Payer: Self-pay | Admitting: Internal Medicine

## 2015-03-25 DIAGNOSIS — E43 Unspecified severe protein-calorie malnutrition: Secondary | ICD-10-CM | POA: Diagnosis not present

## 2015-03-25 DIAGNOSIS — K5790 Diverticulosis of intestine, part unspecified, without perforation or abscess without bleeding: Secondary | ICD-10-CM | POA: Diagnosis not present

## 2015-03-25 DIAGNOSIS — M545 Low back pain: Secondary | ICD-10-CM | POA: Diagnosis not present

## 2015-03-25 DIAGNOSIS — I429 Cardiomyopathy, unspecified: Secondary | ICD-10-CM | POA: Diagnosis not present

## 2015-03-25 DIAGNOSIS — I10 Essential (primary) hypertension: Secondary | ICD-10-CM | POA: Diagnosis not present

## 2015-03-25 DIAGNOSIS — I2510 Atherosclerotic heart disease of native coronary artery without angina pectoris: Secondary | ICD-10-CM | POA: Diagnosis not present

## 2015-03-25 DIAGNOSIS — F0391 Unspecified dementia with behavioral disturbance: Secondary | ICD-10-CM | POA: Diagnosis not present

## 2015-03-25 DIAGNOSIS — K219 Gastro-esophageal reflux disease without esophagitis: Secondary | ICD-10-CM | POA: Diagnosis not present

## 2015-03-26 DIAGNOSIS — M545 Low back pain: Secondary | ICD-10-CM | POA: Diagnosis not present

## 2015-03-29 DIAGNOSIS — I429 Cardiomyopathy, unspecified: Secondary | ICD-10-CM | POA: Diagnosis not present

## 2015-03-29 DIAGNOSIS — M545 Low back pain: Secondary | ICD-10-CM | POA: Diagnosis not present

## 2015-03-29 DIAGNOSIS — F0391 Unspecified dementia with behavioral disturbance: Secondary | ICD-10-CM | POA: Diagnosis not present

## 2015-03-29 DIAGNOSIS — K5790 Diverticulosis of intestine, part unspecified, without perforation or abscess without bleeding: Secondary | ICD-10-CM | POA: Diagnosis not present

## 2015-03-29 DIAGNOSIS — E43 Unspecified severe protein-calorie malnutrition: Secondary | ICD-10-CM | POA: Diagnosis not present

## 2015-03-29 DIAGNOSIS — K219 Gastro-esophageal reflux disease without esophagitis: Secondary | ICD-10-CM | POA: Diagnosis not present

## 2015-03-29 DIAGNOSIS — I10 Essential (primary) hypertension: Secondary | ICD-10-CM | POA: Diagnosis not present

## 2015-03-29 DIAGNOSIS — I2510 Atherosclerotic heart disease of native coronary artery without angina pectoris: Secondary | ICD-10-CM | POA: Diagnosis not present

## 2015-03-30 DIAGNOSIS — M545 Low back pain: Secondary | ICD-10-CM | POA: Diagnosis not present

## 2015-03-31 DIAGNOSIS — F0391 Unspecified dementia with behavioral disturbance: Secondary | ICD-10-CM | POA: Diagnosis not present

## 2015-03-31 DIAGNOSIS — I429 Cardiomyopathy, unspecified: Secondary | ICD-10-CM | POA: Diagnosis not present

## 2015-03-31 DIAGNOSIS — K5790 Diverticulosis of intestine, part unspecified, without perforation or abscess without bleeding: Secondary | ICD-10-CM | POA: Diagnosis not present

## 2015-03-31 DIAGNOSIS — K219 Gastro-esophageal reflux disease without esophagitis: Secondary | ICD-10-CM | POA: Diagnosis not present

## 2015-03-31 DIAGNOSIS — I10 Essential (primary) hypertension: Secondary | ICD-10-CM | POA: Diagnosis not present

## 2015-03-31 DIAGNOSIS — E43 Unspecified severe protein-calorie malnutrition: Secondary | ICD-10-CM | POA: Diagnosis not present

## 2015-03-31 DIAGNOSIS — M545 Low back pain: Secondary | ICD-10-CM | POA: Diagnosis not present

## 2015-03-31 DIAGNOSIS — I2510 Atherosclerotic heart disease of native coronary artery without angina pectoris: Secondary | ICD-10-CM | POA: Diagnosis not present

## 2015-04-01 DIAGNOSIS — F0391 Unspecified dementia with behavioral disturbance: Secondary | ICD-10-CM | POA: Diagnosis not present

## 2015-04-01 DIAGNOSIS — I10 Essential (primary) hypertension: Secondary | ICD-10-CM | POA: Diagnosis not present

## 2015-04-01 DIAGNOSIS — E43 Unspecified severe protein-calorie malnutrition: Secondary | ICD-10-CM | POA: Diagnosis not present

## 2015-04-01 DIAGNOSIS — K5790 Diverticulosis of intestine, part unspecified, without perforation or abscess without bleeding: Secondary | ICD-10-CM | POA: Diagnosis not present

## 2015-04-01 DIAGNOSIS — I2510 Atherosclerotic heart disease of native coronary artery without angina pectoris: Secondary | ICD-10-CM | POA: Diagnosis not present

## 2015-04-01 DIAGNOSIS — M545 Low back pain: Secondary | ICD-10-CM | POA: Diagnosis not present

## 2015-04-01 DIAGNOSIS — K219 Gastro-esophageal reflux disease without esophagitis: Secondary | ICD-10-CM | POA: Diagnosis not present

## 2015-04-01 DIAGNOSIS — I429 Cardiomyopathy, unspecified: Secondary | ICD-10-CM | POA: Diagnosis not present

## 2015-04-02 DIAGNOSIS — M545 Low back pain: Secondary | ICD-10-CM | POA: Diagnosis not present

## 2015-04-05 DIAGNOSIS — K5790 Diverticulosis of intestine, part unspecified, without perforation or abscess without bleeding: Secondary | ICD-10-CM | POA: Diagnosis not present

## 2015-04-05 DIAGNOSIS — E43 Unspecified severe protein-calorie malnutrition: Secondary | ICD-10-CM | POA: Diagnosis not present

## 2015-04-05 DIAGNOSIS — I10 Essential (primary) hypertension: Secondary | ICD-10-CM | POA: Diagnosis not present

## 2015-04-05 DIAGNOSIS — I429 Cardiomyopathy, unspecified: Secondary | ICD-10-CM | POA: Diagnosis not present

## 2015-04-05 DIAGNOSIS — K219 Gastro-esophageal reflux disease without esophagitis: Secondary | ICD-10-CM | POA: Diagnosis not present

## 2015-04-05 DIAGNOSIS — M545 Low back pain: Secondary | ICD-10-CM | POA: Diagnosis not present

## 2015-04-05 DIAGNOSIS — F0391 Unspecified dementia with behavioral disturbance: Secondary | ICD-10-CM | POA: Diagnosis not present

## 2015-04-05 DIAGNOSIS — I2510 Atherosclerotic heart disease of native coronary artery without angina pectoris: Secondary | ICD-10-CM | POA: Diagnosis not present

## 2015-04-06 DIAGNOSIS — K219 Gastro-esophageal reflux disease without esophagitis: Secondary | ICD-10-CM | POA: Diagnosis not present

## 2015-04-06 DIAGNOSIS — I429 Cardiomyopathy, unspecified: Secondary | ICD-10-CM | POA: Diagnosis not present

## 2015-04-06 DIAGNOSIS — I10 Essential (primary) hypertension: Secondary | ICD-10-CM | POA: Diagnosis not present

## 2015-04-06 DIAGNOSIS — F0391 Unspecified dementia with behavioral disturbance: Secondary | ICD-10-CM | POA: Diagnosis not present

## 2015-04-06 DIAGNOSIS — E43 Unspecified severe protein-calorie malnutrition: Secondary | ICD-10-CM | POA: Diagnosis not present

## 2015-04-06 DIAGNOSIS — I2510 Atherosclerotic heart disease of native coronary artery without angina pectoris: Secondary | ICD-10-CM | POA: Diagnosis not present

## 2015-04-06 DIAGNOSIS — K5790 Diverticulosis of intestine, part unspecified, without perforation or abscess without bleeding: Secondary | ICD-10-CM | POA: Diagnosis not present

## 2015-04-06 DIAGNOSIS — M545 Low back pain: Secondary | ICD-10-CM | POA: Diagnosis not present

## 2015-04-07 DIAGNOSIS — M545 Low back pain: Secondary | ICD-10-CM | POA: Diagnosis not present

## 2015-04-08 DIAGNOSIS — M545 Low back pain: Secondary | ICD-10-CM | POA: Diagnosis not present

## 2015-04-09 DIAGNOSIS — M545 Low back pain: Secondary | ICD-10-CM | POA: Diagnosis not present

## 2015-04-12 ENCOUNTER — Other Ambulatory Visit: Payer: Self-pay | Admitting: Internal Medicine

## 2015-04-12 DIAGNOSIS — M545 Low back pain: Secondary | ICD-10-CM | POA: Diagnosis not present

## 2015-04-12 DIAGNOSIS — I429 Cardiomyopathy, unspecified: Secondary | ICD-10-CM | POA: Diagnosis not present

## 2015-04-12 DIAGNOSIS — F0391 Unspecified dementia with behavioral disturbance: Secondary | ICD-10-CM | POA: Diagnosis not present

## 2015-04-12 DIAGNOSIS — E43 Unspecified severe protein-calorie malnutrition: Secondary | ICD-10-CM | POA: Diagnosis not present

## 2015-04-12 DIAGNOSIS — I10 Essential (primary) hypertension: Secondary | ICD-10-CM | POA: Diagnosis not present

## 2015-04-12 DIAGNOSIS — K219 Gastro-esophageal reflux disease without esophagitis: Secondary | ICD-10-CM | POA: Diagnosis not present

## 2015-04-12 DIAGNOSIS — K5790 Diverticulosis of intestine, part unspecified, without perforation or abscess without bleeding: Secondary | ICD-10-CM | POA: Diagnosis not present

## 2015-04-12 DIAGNOSIS — I2510 Atherosclerotic heart disease of native coronary artery without angina pectoris: Secondary | ICD-10-CM | POA: Diagnosis not present

## 2015-04-13 DIAGNOSIS — M545 Low back pain: Secondary | ICD-10-CM | POA: Diagnosis not present

## 2015-04-14 DIAGNOSIS — E43 Unspecified severe protein-calorie malnutrition: Secondary | ICD-10-CM | POA: Diagnosis not present

## 2015-04-14 DIAGNOSIS — K219 Gastro-esophageal reflux disease without esophagitis: Secondary | ICD-10-CM | POA: Diagnosis not present

## 2015-04-14 DIAGNOSIS — I2510 Atherosclerotic heart disease of native coronary artery without angina pectoris: Secondary | ICD-10-CM | POA: Diagnosis not present

## 2015-04-14 DIAGNOSIS — F0391 Unspecified dementia with behavioral disturbance: Secondary | ICD-10-CM | POA: Diagnosis not present

## 2015-04-14 DIAGNOSIS — I429 Cardiomyopathy, unspecified: Secondary | ICD-10-CM | POA: Diagnosis not present

## 2015-04-14 DIAGNOSIS — M545 Low back pain: Secondary | ICD-10-CM | POA: Diagnosis not present

## 2015-04-14 DIAGNOSIS — K5790 Diverticulosis of intestine, part unspecified, without perforation or abscess without bleeding: Secondary | ICD-10-CM | POA: Diagnosis not present

## 2015-04-14 DIAGNOSIS — I10 Essential (primary) hypertension: Secondary | ICD-10-CM | POA: Diagnosis not present

## 2015-04-15 DIAGNOSIS — M545 Low back pain: Secondary | ICD-10-CM | POA: Diagnosis not present

## 2015-04-16 DIAGNOSIS — M545 Low back pain: Secondary | ICD-10-CM | POA: Diagnosis not present

## 2015-04-19 DIAGNOSIS — M545 Low back pain: Secondary | ICD-10-CM | POA: Diagnosis not present

## 2015-04-20 DIAGNOSIS — K219 Gastro-esophageal reflux disease without esophagitis: Secondary | ICD-10-CM | POA: Diagnosis not present

## 2015-04-20 DIAGNOSIS — E43 Unspecified severe protein-calorie malnutrition: Secondary | ICD-10-CM | POA: Diagnosis not present

## 2015-04-20 DIAGNOSIS — I10 Essential (primary) hypertension: Secondary | ICD-10-CM | POA: Diagnosis not present

## 2015-04-20 DIAGNOSIS — F0391 Unspecified dementia with behavioral disturbance: Secondary | ICD-10-CM | POA: Diagnosis not present

## 2015-04-20 DIAGNOSIS — K5790 Diverticulosis of intestine, part unspecified, without perforation or abscess without bleeding: Secondary | ICD-10-CM | POA: Diagnosis not present

## 2015-04-20 DIAGNOSIS — I429 Cardiomyopathy, unspecified: Secondary | ICD-10-CM | POA: Diagnosis not present

## 2015-04-20 DIAGNOSIS — I2510 Atherosclerotic heart disease of native coronary artery without angina pectoris: Secondary | ICD-10-CM | POA: Diagnosis not present

## 2015-04-20 DIAGNOSIS — M545 Low back pain: Secondary | ICD-10-CM | POA: Diagnosis not present

## 2015-04-21 DIAGNOSIS — M545 Low back pain: Secondary | ICD-10-CM | POA: Diagnosis not present

## 2015-04-22 DIAGNOSIS — E43 Unspecified severe protein-calorie malnutrition: Secondary | ICD-10-CM | POA: Diagnosis not present

## 2015-04-22 DIAGNOSIS — F0391 Unspecified dementia with behavioral disturbance: Secondary | ICD-10-CM | POA: Diagnosis not present

## 2015-04-22 DIAGNOSIS — M545 Low back pain: Secondary | ICD-10-CM | POA: Diagnosis not present

## 2015-04-22 DIAGNOSIS — I10 Essential (primary) hypertension: Secondary | ICD-10-CM | POA: Diagnosis not present

## 2015-04-22 DIAGNOSIS — I2510 Atherosclerotic heart disease of native coronary artery without angina pectoris: Secondary | ICD-10-CM | POA: Diagnosis not present

## 2015-04-22 DIAGNOSIS — K219 Gastro-esophageal reflux disease without esophagitis: Secondary | ICD-10-CM | POA: Diagnosis not present

## 2015-04-22 DIAGNOSIS — K5790 Diverticulosis of intestine, part unspecified, without perforation or abscess without bleeding: Secondary | ICD-10-CM | POA: Diagnosis not present

## 2015-04-22 DIAGNOSIS — I429 Cardiomyopathy, unspecified: Secondary | ICD-10-CM | POA: Diagnosis not present

## 2015-04-23 DIAGNOSIS — M545 Low back pain: Secondary | ICD-10-CM | POA: Diagnosis not present

## 2015-04-26 DIAGNOSIS — I2510 Atherosclerotic heart disease of native coronary artery without angina pectoris: Secondary | ICD-10-CM | POA: Diagnosis not present

## 2015-04-26 DIAGNOSIS — K5790 Diverticulosis of intestine, part unspecified, without perforation or abscess without bleeding: Secondary | ICD-10-CM | POA: Diagnosis not present

## 2015-04-26 DIAGNOSIS — K219 Gastro-esophageal reflux disease without esophagitis: Secondary | ICD-10-CM | POA: Diagnosis not present

## 2015-04-26 DIAGNOSIS — I10 Essential (primary) hypertension: Secondary | ICD-10-CM | POA: Diagnosis not present

## 2015-04-26 DIAGNOSIS — M545 Low back pain: Secondary | ICD-10-CM | POA: Diagnosis not present

## 2015-04-26 DIAGNOSIS — F0391 Unspecified dementia with behavioral disturbance: Secondary | ICD-10-CM | POA: Diagnosis not present

## 2015-04-26 DIAGNOSIS — E43 Unspecified severe protein-calorie malnutrition: Secondary | ICD-10-CM | POA: Diagnosis not present

## 2015-04-26 DIAGNOSIS — I429 Cardiomyopathy, unspecified: Secondary | ICD-10-CM | POA: Diagnosis not present

## 2015-04-27 DIAGNOSIS — M545 Low back pain: Secondary | ICD-10-CM | POA: Diagnosis not present

## 2015-04-28 DIAGNOSIS — K219 Gastro-esophageal reflux disease without esophagitis: Secondary | ICD-10-CM | POA: Diagnosis not present

## 2015-04-28 DIAGNOSIS — E43 Unspecified severe protein-calorie malnutrition: Secondary | ICD-10-CM | POA: Diagnosis not present

## 2015-04-28 DIAGNOSIS — I429 Cardiomyopathy, unspecified: Secondary | ICD-10-CM | POA: Diagnosis not present

## 2015-04-28 DIAGNOSIS — I10 Essential (primary) hypertension: Secondary | ICD-10-CM | POA: Diagnosis not present

## 2015-04-28 DIAGNOSIS — I2510 Atherosclerotic heart disease of native coronary artery without angina pectoris: Secondary | ICD-10-CM | POA: Diagnosis not present

## 2015-04-28 DIAGNOSIS — K5790 Diverticulosis of intestine, part unspecified, without perforation or abscess without bleeding: Secondary | ICD-10-CM | POA: Diagnosis not present

## 2015-04-28 DIAGNOSIS — F0391 Unspecified dementia with behavioral disturbance: Secondary | ICD-10-CM | POA: Diagnosis not present

## 2015-04-28 DIAGNOSIS — M545 Low back pain: Secondary | ICD-10-CM | POA: Diagnosis not present

## 2015-04-29 DIAGNOSIS — M545 Low back pain: Secondary | ICD-10-CM | POA: Diagnosis not present

## 2015-04-30 DIAGNOSIS — M545 Low back pain: Secondary | ICD-10-CM | POA: Diagnosis not present

## 2015-05-03 DIAGNOSIS — M545 Low back pain: Secondary | ICD-10-CM | POA: Diagnosis not present

## 2015-05-04 DIAGNOSIS — M545 Low back pain: Secondary | ICD-10-CM | POA: Diagnosis not present

## 2015-05-05 DIAGNOSIS — M545 Low back pain: Secondary | ICD-10-CM | POA: Diagnosis not present

## 2015-05-06 DIAGNOSIS — M545 Low back pain: Secondary | ICD-10-CM | POA: Diagnosis not present

## 2015-05-07 DIAGNOSIS — M545 Low back pain: Secondary | ICD-10-CM | POA: Diagnosis not present

## 2015-05-10 ENCOUNTER — Other Ambulatory Visit: Payer: Self-pay | Admitting: Internal Medicine

## 2015-05-10 ENCOUNTER — Emergency Department (HOSPITAL_COMMUNITY): Payer: Commercial Managed Care - HMO

## 2015-05-10 ENCOUNTER — Encounter (HOSPITAL_COMMUNITY): Payer: Self-pay | Admitting: *Deleted

## 2015-05-10 ENCOUNTER — Inpatient Hospital Stay (HOSPITAL_COMMUNITY)
Admission: EM | Admit: 2015-05-10 | Discharge: 2015-05-11 | DRG: 392 | Disposition: A | Discharge status: Home Health | Payer: Commercial Managed Care - HMO | Attending: Internal Medicine | Admitting: Internal Medicine

## 2015-05-10 DIAGNOSIS — I429 Cardiomyopathy, unspecified: Secondary | ICD-10-CM | POA: Diagnosis not present

## 2015-05-10 DIAGNOSIS — I10 Essential (primary) hypertension: Secondary | ICD-10-CM | POA: Diagnosis not present

## 2015-05-10 DIAGNOSIS — A09 Infectious gastroenteritis and colitis, unspecified: Principal | ICD-10-CM | POA: Diagnosis present

## 2015-05-10 DIAGNOSIS — B029 Zoster without complications: Secondary | ICD-10-CM | POA: Diagnosis not present

## 2015-05-10 DIAGNOSIS — F419 Anxiety disorder, unspecified: Secondary | ICD-10-CM | POA: Diagnosis present

## 2015-05-10 DIAGNOSIS — N179 Acute kidney failure, unspecified: Secondary | ICD-10-CM | POA: Diagnosis not present

## 2015-05-10 DIAGNOSIS — R109 Unspecified abdominal pain: Secondary | ICD-10-CM | POA: Diagnosis present

## 2015-05-10 DIAGNOSIS — Z79899 Other long term (current) drug therapy: Secondary | ICD-10-CM

## 2015-05-10 DIAGNOSIS — M1990 Unspecified osteoarthritis, unspecified site: Secondary | ICD-10-CM | POA: Diagnosis present

## 2015-05-10 DIAGNOSIS — E039 Hypothyroidism, unspecified: Secondary | ICD-10-CM | POA: Diagnosis present

## 2015-05-10 DIAGNOSIS — K5730 Diverticulosis of large intestine without perforation or abscess without bleeding: Secondary | ICD-10-CM | POA: Diagnosis not present

## 2015-05-10 DIAGNOSIS — I2510 Atherosclerotic heart disease of native coronary artery without angina pectoris: Secondary | ICD-10-CM | POA: Diagnosis present

## 2015-05-10 DIAGNOSIS — E860 Dehydration: Secondary | ICD-10-CM | POA: Diagnosis present

## 2015-05-10 DIAGNOSIS — K2970 Gastritis, unspecified, without bleeding: Secondary | ICD-10-CM | POA: Diagnosis not present

## 2015-05-10 DIAGNOSIS — R1084 Generalized abdominal pain: Secondary | ICD-10-CM | POA: Diagnosis not present

## 2015-05-10 DIAGNOSIS — R1033 Periumbilical pain: Secondary | ICD-10-CM | POA: Diagnosis not present

## 2015-05-10 DIAGNOSIS — H409 Unspecified glaucoma: Secondary | ICD-10-CM | POA: Diagnosis not present

## 2015-05-10 DIAGNOSIS — F329 Major depressive disorder, single episode, unspecified: Secondary | ICD-10-CM | POA: Diagnosis present

## 2015-05-10 DIAGNOSIS — K219 Gastro-esophageal reflux disease without esophagitis: Secondary | ICD-10-CM | POA: Diagnosis not present

## 2015-05-10 DIAGNOSIS — F0390 Unspecified dementia without behavioral disturbance: Secondary | ICD-10-CM | POA: Diagnosis not present

## 2015-05-10 DIAGNOSIS — K529 Noninfective gastroenteritis and colitis, unspecified: Secondary | ICD-10-CM

## 2015-05-10 DIAGNOSIS — R110 Nausea: Secondary | ICD-10-CM | POA: Diagnosis not present

## 2015-05-10 DIAGNOSIS — I700 Atherosclerosis of aorta: Secondary | ICD-10-CM | POA: Diagnosis not present

## 2015-05-10 DIAGNOSIS — I252 Old myocardial infarction: Secondary | ICD-10-CM | POA: Diagnosis not present

## 2015-05-10 DIAGNOSIS — R55 Syncope and collapse: Secondary | ICD-10-CM | POA: Diagnosis present

## 2015-05-10 DIAGNOSIS — Z66 Do not resuscitate: Secondary | ICD-10-CM | POA: Diagnosis present

## 2015-05-10 DIAGNOSIS — M545 Low back pain: Secondary | ICD-10-CM | POA: Diagnosis not present

## 2015-05-10 LAB — COMPREHENSIVE METABOLIC PANEL
ALK PHOS: 67 U/L (ref 38–126)
ALT: 8 U/L — AB (ref 14–54)
ANION GAP: 8 (ref 5–15)
AST: 21 U/L (ref 15–41)
Albumin: 3.6 g/dL (ref 3.5–5.0)
BILIRUBIN TOTAL: 1 mg/dL (ref 0.3–1.2)
BUN: 26 mg/dL — ABNORMAL HIGH (ref 6–20)
CALCIUM: 9.1 mg/dL (ref 8.9–10.3)
CO2: 28 mmol/L (ref 22–32)
CREATININE: 1.11 mg/dL — AB (ref 0.44–1.00)
Chloride: 106 mmol/L (ref 101–111)
GFR, EST AFRICAN AMERICAN: 47 mL/min — AB (ref >60)
GFR, EST NON AFRICAN AMERICAN: 41 mL/min — AB (ref >60)
Glucose, Bld: 126 mg/dL — ABNORMAL HIGH (ref 65–99)
Potassium: 3.6 mmol/L (ref 3.5–5.1)
Sodium: 142 mmol/L (ref 135–145)
TOTAL PROTEIN: 7 g/dL (ref 6.5–8.1)

## 2015-05-10 LAB — CBC WITH DIFFERENTIAL/PLATELET
Basophils Absolute: 0 10*3/uL (ref 0.0–0.1)
Basophils Relative: 0 % (ref 0–1)
Eosinophils Absolute: 0 10*3/uL (ref 0.0–0.7)
Eosinophils Relative: 1 % (ref 0–5)
HEMATOCRIT: 32.5 % — AB (ref 36.0–46.0)
HEMOGLOBIN: 9.9 g/dL — AB (ref 12.0–15.0)
LYMPHS ABS: 1.5 10*3/uL (ref 0.7–4.0)
LYMPHS PCT: 22 % (ref 12–46)
MCH: 26.7 pg (ref 26.0–34.0)
MCHC: 30.5 g/dL (ref 30.0–36.0)
MCV: 87.6 fL (ref 78.0–100.0)
Monocytes Absolute: 0.3 10*3/uL (ref 0.1–1.0)
Monocytes Relative: 5 % (ref 3–12)
NEUTROS PCT: 72 % (ref 43–77)
Neutro Abs: 4.8 10*3/uL (ref 1.7–7.7)
Platelets: 185 10*3/uL (ref 150–400)
RBC: 3.71 MIL/uL — AB (ref 3.87–5.11)
RDW: 15.8 % — ABNORMAL HIGH (ref 11.5–15.5)
WBC: 6.6 10*3/uL (ref 4.0–10.5)

## 2015-05-10 LAB — URINALYSIS, ROUTINE W REFLEX MICROSCOPIC
BILIRUBIN URINE: NEGATIVE
Glucose, UA: NEGATIVE mg/dL
HGB URINE DIPSTICK: NEGATIVE
Ketones, ur: NEGATIVE mg/dL
Leukocytes, UA: NEGATIVE
NITRITE: NEGATIVE
Protein, ur: NEGATIVE mg/dL
SPECIFIC GRAVITY, URINE: 1.017 (ref 1.005–1.030)
UROBILINOGEN UA: 0.2 mg/dL (ref 0.0–1.0)
pH: 7 (ref 5.0–8.0)

## 2015-05-10 LAB — LIPASE, BLOOD: LIPASE: 11 U/L — AB (ref 22–51)

## 2015-05-10 LAB — POC OCCULT BLOOD, ED: Fecal Occult Bld: NEGATIVE

## 2015-05-10 MED ORDER — CIPROFLOXACIN IN D5W 400 MG/200ML IV SOLN
400.0000 mg | Freq: Once | INTRAVENOUS | Status: AC
  Administered 2015-05-10: 400 mg via INTRAVENOUS
  Filled 2015-05-10: qty 200

## 2015-05-10 MED ORDER — IOHEXOL 300 MG/ML  SOLN
50.0000 mL | Freq: Once | INTRAMUSCULAR | Status: AC | PRN
  Administered 2015-05-10: 50 mL via ORAL

## 2015-05-10 MED ORDER — SODIUM CHLORIDE 0.9 % IV BOLUS (SEPSIS)
250.0000 mL | Freq: Once | INTRAVENOUS | Status: AC
  Administered 2015-05-10: 250 mL via INTRAVENOUS

## 2015-05-10 MED ORDER — METRONIDAZOLE IN NACL 5-0.79 MG/ML-% IV SOLN
500.0000 mg | Freq: Once | INTRAVENOUS | Status: AC
  Administered 2015-05-10: 500 mg via INTRAVENOUS
  Filled 2015-05-10: qty 100

## 2015-05-10 MED ORDER — IOHEXOL 300 MG/ML  SOLN
100.0000 mL | Freq: Once | INTRAMUSCULAR | Status: AC | PRN
  Administered 2015-05-10: 100 mL via INTRAVENOUS

## 2015-05-10 NOTE — ED Notes (Signed)
Hx of diverticulitis, complaining of periumbilical pain x 2 days. Pain is worse today. Home health aide got her on bedside commode, while having BM was near syncopal. Fully consciousness on EMS arrival. Has had several non bloody formed BMs over last several days.

## 2015-05-10 NOTE — H&P (Signed)
[facility] History and Physical  [name] BSW:[id number] DOB: Oct 16, 1916 DOA: 05/10/2015  Referring physician: Donella Stade, MD PCP: Cathlean Cower, MD   Chief Complaint: Abdominal Pain  HPI: [name] is a 81 y.o. female with history of HTN Hypothyroidism GERD anxiety depression presents with abdominal pain. The patients pain started about 2 days ago. It is located in the periumbilical area. There was no associated nausea or vomiting. Patient has had no diarrhea. Today patient was on the bedside commode and had a near syncopal episode. Patient had no blood in stools. She has had no fevers noted. She has no chest pain noted. She has had no swelling of legs noted. No headaches no mental status changes. Patient had a CT scan of the abdomen done and this shows presence of enteritis felt to be infectious. In the ED she was given cipro and flagyl   Review of Systems:  12 point ROS performed and is unremarkable other than HPI  Past Medical History  Diagnosis Date  . Internal hemorrhoid 07/2012  . Diverticulosis of colon (without mention of hemorrhage) 07/2012  . Herpes zoster   . Glaucoma   . Lumbar back pain   . Hypertension   . Cardiomyopathy   . Hypothyroid   . Myocardial infarction   . History of blood transfusion     "related to diverticulitis" (12/02/2013)  . GERD (gastroesophageal reflux disease)   . Arthritis     "joints" (12/02/2013)  . Depression   . Anxiety   . Adenomatous colon polyp 07/2012    sessile cecal polyp.  no high grade dysplasia.   Marland Kitchen Hyponatremia 07/2012    Na as low as 114.   . Protein calorie malnutrition 07/2012    BMI then: 16.5  . Zenker diverticulum 07/2012    this and esophageal dysmotility on esophagram.   . Psychosis   . Behavior disturbance    Past Surgical History  Procedure Laterality Date  . Thyroidectomy    . Appendectomy  2005    ruptured appendix  . Tubal ligation    . Colonoscopy  07/19/2012    Procedure: COLONOSCOPY;   Surgeon: Ladene Artist, MD,FACG;  Location: [facility];  Service: Endoscopy;  Laterality: N/A;  . Cataract extraction w/ intraocular lens  implant, bilateral Bilateral   . Glaucoma surgery Right    Social History:  reports that she has never smoked. She has never used smokeless tobacco. She reports that she does not drink alcohol or use illicit drugs.  No Known Allergies  Family History  Problem Relation Age of Onset  . Colon cancer Neg Hx   . Stroke Daughter   . Heart attack    . Dementia Sister      Prior to Admission medications   Medication Sig Start Date End Date Taking? Authorizing Provider  acetaminophen (TYLENOL) 500 MG tablet Take 500 mg by mouth every 6 (six) hours as needed for moderate pain.   Yes Historical Provider, MD  Calcium Carbonate-Vit D-Min (CALTRATE 600+D PLUS MINERALS) 600-800 MG-UNIT TABS Take 1 tablet by mouth daily.   Yes Historical Provider, MD  CARAFATE 1 GM/10ML suspension TAKE 10 MLS (1 G TOTAL) BY MOUTH 4 (FOUR) TIMES DAILY - WITH MEALS AND AT BEDTIME. 05/10/15  Yes Biagio Borg, MD  cyanocobalamin ([employer] VITAMIN B12) 1000 MCG tablet Take 1 tablet (1,000 mcg total) by mouth daily. 04/12/15  Yes Biagio Borg, MD  donepezil (ARICEPT) 5 MG tablet Take 1 tablet (5 mg total) by  mouth at bedtime. 03/09/15  Yes Janece Canterbury, MD  feeding supplement, RESOURCE BREEZE, (RESOURCE BREEZE) LIQD Take 1 Container by mouth 3 (three) times daily between meals. 03/09/15  Yes Janece Canterbury, MD  isosorbide mononitrate (IMDUR) 30 MG 24 hr tablet Take 1 tablet (30 mg total) by mouth daily. 05/19/14  Yes Biagio Borg, MD  levothyroxine (SYNTHROID, LEVOTHROID) 88 MCG tablet TAKE 1 TABLET (88 MCG TOTAL) BY MOUTH DAILY BEFORE BREAKFAST. 03/25/15  Yes Biagio Borg, MD  lisinopril (PRINIVIL,ZESTRIL) 10 MG tablet TAKE 1 TABLET EVERY DAY Patient taking differently: TAKE 1 TABLET BY MOUTH EVERY DAY 03/25/15  Yes Biagio Borg, MD  nitroGLYCERIN (NITROSTAT) 0.4 MG SL tablet Place 1 tablet (0.4  mg total) under the tongue every 5 (five) minutes as needed. Chest pains 10/20/14  Yes Biagio Borg, MD  polyvinyl alcohol (LIQUIFILM TEARS) 1.4 % ophthalmic solution Place 1 drop into both eyes 2 (two) times daily as needed for dry eyes.   Yes Historical Provider, MD  sertraline (ZOLOFT) 25 MG tablet Take 1 tablet (25 mg total) by mouth daily. 05/28/14  Yes Biagio Borg, MD  sodium chloride (OCEAN) 0.65 % SOLN nasal spray Place 1 spray into both nostrils daily as needed for congestion.   Yes Historical Provider, MD  magnesium chloride (SLOW-MAG) 64 MG TBEC SR tablet Take 2 tablets (128 mg total) by mouth 2 (two) times daily. Patient not taking: Reported on 03/10/2015 03/09/15   Janece Canterbury, MD  polyethylene glycol powder [facility]) powder Take 17 g by mouth daily. Patient taking differently: Take 17 g by mouth 3 (three) times a week. Mon, Wed, Fri. 03/09/15   Janece Canterbury, MD   Physical Exam: Filed Vitals:   05/10/15 1752  BP: 145/63  Pulse: 66  Resp: 16  SpO2: 100%    Wt Readings from Last 3 Encounters:  03/07/15 40.1 kg (88 lb 6.5 oz)  01/05/15 41.277 kg (91 lb)  10/20/14 40.37 kg (89 lb)    General:  Appears calm and comfortable Eyes: PERRL, normal lids, irises & conjunctiva ENT: grossly normal hearing, lips & tongue Neck: no LAD, masses or thyromegaly Cardiovascular: RRR, no m/r/g. No LE edema Respiratory: CTA bilaterally, no w/r/r. Normal respiratory effort. Abdomen: soft, ntnd Skin: no rash or induration seen on limited exam Musculoskeletal: grossly normal tone BUE/BLE Psychiatric: grossly normal mood and affect Neurologic: grossly non-focal.          Labs on Admission:  Basic Metabolic Panel:  Recent Labs Lab 05/10/15 1604  NA 142  K 3.6  CL 106  CO2 28  GLUCOSE 126*  BUN 26*  CREATININE 1.11*  CALCIUM 9.1   Liver Function Tests:  Recent Labs Lab 05/10/15 1604  AST 21  ALT 8*  ALKPHOS 67  BILITOT 1.0  PROT 7.0  ALBUMIN 3.6    Recent Labs Lab  05/10/15 1604  LIPASE 11*   No results for input(s): AMMONIA in the last 168 hours. CBC:  Recent Labs Lab 05/10/15 1604  WBC 6.6  NEUTROABS 4.8  HGB 9.9*  HCT 32.5*  MCV 87.6  PLT 185   Cardiac Enzymes: No results for input(s): CKTOTAL, CKMB, CKMBINDEX, TROPONINI in the last 168 hours.  BNP (last 3 results) No results for input(s): BNP in the last 8760 hours.  ProBNP (last 3 results) No results for input(s): PROBNP in the last 8760 hours.  CBG: No results for input(s): GLUCAP in the last 168 hours.  Radiological Exams on Admission: Ct Abdomen  Pelvis W Contrast  05/10/2015   CLINICAL DATA:  Acute periumbilical abdominal pain.  EXAM: CT ABDOMEN AND PELVIS WITH CONTRAST  TECHNIQUE: Multidetector CT imaging of the abdomen and pelvis was performed using the standard protocol following bolus administration of intravenous contrast.  CONTRAST:  147mL OMNIPAQUE IOHEXOL 300 MG/ML SOLN, 80mL OMNIPAQUE IOHEXOL 300 MG/ML SOLN  COMPARISON:  CT scan of March 07, 2015.  FINDINGS: Visualized lung bases appear normal. No significant osseous abnormality is noted.  No gallstones are noted. No focal abnormality is noted in the liver, spleen or pancreas. Adrenal glands appear normal. Bilateral renal cysts are noted. No hydronephrosis or renal obstruction is noted. Atherosclerosis of abdominal aorta is noted without aneurysm formation. Status post appendectomy. Diffuse fold thickening of a portion of distal small bowel is noted in the pelvis, which results in mild dilatation of more proximal small bowel loops. This is most consistent with focal inflammation or enteritis. Small amount of free fluid is noted in the dependent portion of the pelvis. No colonic dilatation is noted. Urinary bladder appears normal. Phleboliths are noted in the pelvis. No significant adenopathy is noted.  IMPRESSION: Atherosclerosis of abdominal aorta is noted without aneurysm formation.  Diffuse fold thickening of a portion of small  bowel is noted posteriorly in the pelvis, with mild dilatation of the more proximal small bowel. This is most consistent with focal inflammation or enteritis but infiltrative disease such as lymphoma cannot be excluded.   Electronically Signed   By: Marijo Conception, M.D.   On: 05/10/2015 17:41      Assessment/Plan Principal Problem:   Enteritis, infectious, presumed Active Problems:   Hypothyroidism   Essential hypertension   Diverticulosis of large intestine   GERD (gastroesophageal reflux disease)   AKI (acute kidney injury)   Dementia   Abdominal pain   1. Enteritis likely infectious -will admit to med-surg -start on empiric antibiotics over night for presumed infectious etiology -ordered cipro and flagyl -will check stool occult blood  2. Abdominal Pain -secondary to above -will monitor pain control  3. Hypertension -will continue with antihypertensives.  -need to monitor renal function -will monitor pressures   4. GERD -on carafate will continue  5. AKI -likely due to dehydration -started on IVF will monitor labs  6. Hypothyroidism -will resume synthroid -check TSH  7. CAD -will continue with imdur as ordered  8. Pre-syncope -this is not a new finding she has had prior events related to BM and is felt to be vagal mediated -has had negative MRI in the past -echo in 2015 done shows EF in the 45% range with moderate regurge in the valves carotid doppler not found ?if this should be considered     Code Status: DNR (must indicate code status--if unknown or must be presumed, indicate so) DVT Prophylaxis:heparin Family Communication: none (indicate person spoken with, if applicable, with phone number if by telephone) Disposition Plan: home (indicate anticipated LOS)  Time spent: 36min  Doniel Maiello A [facility] Pager [phone]

## 2015-05-10 NOTE — ED Notes (Signed)
Patient transported to CT 

## 2015-05-10 NOTE — ED Provider Notes (Signed)
CSN: [id number]     Arrival date & time 05/10/15  1538 History   First MD Initiated Contact with Patient 05/10/15 1549     Chief Complaint  Patient presents with  . Abdominal Pain     (Consider location/radiation/quality/duration/timing/severity/associated sxs/prior Treatment) Patient is a 81 y.o. female presenting with abdominal pain. The history is provided by a relative (the pt has had abd pain all night and vomited once.  she also passed out on the toilet.  mild abd pain now).  Abdominal Pain Pain location:  Generalized Pain quality: aching   Pain radiates to:  Does not radiate Pain severity:  Mild Onset quality:  Sudden Timing:  Constant Progression:  Waxing and waning Chronicity:  Recurrent Context: not alcohol use   Associated symptoms: no chest pain, no cough, no diarrhea, no fatigue and no hematuria     Past Medical History  Diagnosis Date  . Internal hemorrhoid 07/2012  . Diverticulosis of colon (without mention of hemorrhage) 07/2012  . Herpes zoster   . Glaucoma   . Lumbar back pain   . Hypertension   . Cardiomyopathy   . Hypothyroid   . Myocardial infarction   . History of blood transfusion     "related to diverticulitis" (12/02/2013)  . GERD (gastroesophageal reflux disease)   . Arthritis     "joints" (12/02/2013)  . Depression   . Anxiety   . Adenomatous colon polyp 07/2012    sessile cecal polyp.  no high grade dysplasia.   Marland Kitchen Hyponatremia 07/2012    Na as low as 114.   . Protein calorie malnutrition 07/2012    BMI then: 16.5  . Zenker diverticulum 07/2012    this and esophageal dysmotility on esophagram.   . Psychosis   . Behavior disturbance    Past Surgical History  Procedure Laterality Date  . Thyroidectomy    . Appendectomy  2005    ruptured appendix  . Tubal ligation    . Colonoscopy  07/19/2012    Procedure: COLONOSCOPY;  Surgeon: Ladene Artist, MD,FACG;  Location: [facility];  Service: Endoscopy;  Laterality: N/A;  . Cataract  extraction w/ intraocular lens  implant, bilateral Bilateral   . Glaucoma surgery Right    Family History  Problem Relation Age of Onset  . Colon cancer Neg Hx   . Stroke Daughter   . Heart attack    . Dementia Sister    Social History  Substance Use Topics  . Smoking status: Never Smoker   . Smokeless tobacco: Never Used  . Alcohol Use: No   OB History    No data available     Review of Systems  Constitutional: Negative for appetite change and fatigue.  HENT: Negative for congestion, ear discharge and sinus pressure.   Eyes: Negative for discharge.  Respiratory: Negative for cough.   Cardiovascular: Negative for chest pain.  Gastrointestinal: Positive for abdominal pain. Negative for diarrhea.  Genitourinary: Negative for frequency and hematuria.  Musculoskeletal: Negative for back pain.  Skin: Negative for rash.  Neurological: Negative for seizures and headaches.  Psychiatric/Behavioral: Negative for hallucinations.      Allergies  Review of patient's allergies indicates no known allergies.  Home Medications   Prior to Admission medications   Medication Sig Start Date End Date Taking? Authorizing Provider  acetaminophen (TYLENOL) 500 MG tablet Take 500 mg by mouth every 6 (six) hours as needed for moderate pain.   Yes Historical Provider, MD  Calcium Carbonate-Vit D-Min (CALTRATE 600+D  PLUS MINERALS) 600-800 MG-UNIT TABS Take 1 tablet by mouth daily.   Yes Historical Provider, MD  CARAFATE 1 GM/10ML suspension TAKE 10 MLS (1 G TOTAL) BY MOUTH 4 (FOUR) TIMES DAILY - WITH MEALS AND AT BEDTIME. 05/10/15  Yes Biagio Borg, MD  cyanocobalamin ([employer] VITAMIN B12) 1000 MCG tablet Take 1 tablet (1,000 mcg total) by mouth daily. 04/12/15  Yes Biagio Borg, MD  donepezil (ARICEPT) 5 MG tablet Take 1 tablet (5 mg total) by mouth at bedtime. 03/09/15  Yes Janece Canterbury, MD  feeding supplement, RESOURCE BREEZE, (RESOURCE BREEZE) LIQD Take 1 Container by mouth 3 (three) times daily  between meals. 03/09/15  Yes Janece Canterbury, MD  isosorbide mononitrate (IMDUR) 30 MG 24 hr tablet Take 1 tablet (30 mg total) by mouth daily. 05/19/14  Yes Biagio Borg, MD  levothyroxine (SYNTHROID, LEVOTHROID) 88 MCG tablet TAKE 1 TABLET (88 MCG TOTAL) BY MOUTH DAILY BEFORE BREAKFAST. 03/25/15  Yes Biagio Borg, MD  lisinopril (PRINIVIL,ZESTRIL) 10 MG tablet TAKE 1 TABLET EVERY DAY Patient taking differently: TAKE 1 TABLET BY MOUTH EVERY DAY 03/25/15  Yes Biagio Borg, MD  nitroGLYCERIN (NITROSTAT) 0.4 MG SL tablet Place 1 tablet (0.4 mg total) under the tongue every 5 (five) minutes as needed. Chest pains 10/20/14  Yes Biagio Borg, MD  polyvinyl alcohol (LIQUIFILM TEARS) 1.4 % ophthalmic solution Place 1 drop into both eyes 2 (two) times daily as needed for dry eyes.   Yes Historical Provider, MD  sertraline (ZOLOFT) 25 MG tablet Take 1 tablet (25 mg total) by mouth daily. 05/28/14  Yes Biagio Borg, MD  sodium chloride (OCEAN) 0.65 % SOLN nasal spray Place 1 spray into both nostrils daily as needed for congestion.   Yes Historical Provider, MD  magnesium chloride (SLOW-MAG) 64 MG TBEC SR tablet Take 2 tablets (128 mg total) by mouth 2 (two) times daily. Patient not taking: Reported on 03/10/2015 03/09/15   Janece Canterbury, MD  polyethylene glycol powder [facility]) powder Take 17 g by mouth daily. Patient taking differently: Take 17 g by mouth 3 (three) times a week. Mon, Wed, Fri. 03/09/15   Janece Canterbury, MD   BP 145/63 mmHg  Pulse 66  Resp 16  SpO2 100% Physical Exam  Constitutional: She is oriented to person, place, and time. She appears well-developed.  HENT:  Head: Normocephalic.  Eyes: Conjunctivae and EOM are normal. No scleral icterus.  Neck: Neck supple. No thyromegaly present.  Cardiovascular: Normal rate and regular rhythm.  Exam reveals no gallop and no friction rub.   No murmur heard. Pulmonary/Chest: No stridor. She has no wheezes. She has no rales. She exhibits no tenderness.   Abdominal: She exhibits no distension. There is tenderness. There is no rebound.  Musculoskeletal: Normal range of motion. She exhibits no edema.  Lymphadenopathy:    She has no cervical adenopathy.  Neurological: She is oriented to person, place, and time. She exhibits normal muscle tone. Coordination normal.  Skin: No rash noted. No erythema.  Psychiatric: She has a normal mood and affect. Her behavior is normal.    ED Course  Procedures (including critical care time) Labs Review Labs Reviewed  CBC WITH DIFFERENTIAL/PLATELET - Abnormal; Notable for the following:    RBC 3.71 (*)    Hemoglobin 9.9 (*)    HCT 32.5 (*)    RDW 15.8 (*)    All other components within normal limits  COMPREHENSIVE METABOLIC PANEL - Abnormal; Notable for the following:  Glucose, Bld 126 (*)    BUN 26 (*)    Creatinine, Ser 1.11 (*)    ALT 8 (*)    GFR calc non Af Amer 41 (*)    GFR calc Af Amer 47 (*)    All other components within normal limits  LIPASE, BLOOD - Abnormal; Notable for the following:    Lipase 11 (*)    All other components within normal limits  URINALYSIS, ROUTINE W REFLEX MICROSCOPIC (NOT AT [facility])  POC OCCULT BLOOD, ED    Imaging Review Ct Abdomen Pelvis W Contrast  05/10/2015   CLINICAL DATA:  Acute periumbilical abdominal pain.  EXAM: CT ABDOMEN AND PELVIS WITH CONTRAST  TECHNIQUE: Multidetector CT imaging of the abdomen and pelvis was performed using the standard protocol following bolus administration of intravenous contrast.  CONTRAST:  156mL OMNIPAQUE IOHEXOL 300 MG/ML SOLN, 13mL OMNIPAQUE IOHEXOL 300 MG/ML SOLN  COMPARISON:  CT scan of March 07, 2015.  FINDINGS: Visualized lung bases appear normal. No significant osseous abnormality is noted.  No gallstones are noted. No focal abnormality is noted in the liver, spleen or pancreas. Adrenal glands appear normal. Bilateral renal cysts are noted. No hydronephrosis or renal obstruction is noted. Atherosclerosis of abdominal aorta  is noted without aneurysm formation. Status post appendectomy. Diffuse fold thickening of a portion of distal small bowel is noted in the pelvis, which results in mild dilatation of more proximal small bowel loops. This is most consistent with focal inflammation or enteritis. Small amount of free fluid is noted in the dependent portion of the pelvis. No colonic dilatation is noted. Urinary bladder appears normal. Phleboliths are noted in the pelvis. No significant adenopathy is noted.  IMPRESSION: Atherosclerosis of abdominal aorta is noted without aneurysm formation.  Diffuse fold thickening of a portion of small bowel is noted posteriorly in the pelvis, with mild dilatation of the more proximal small bowel. This is most consistent with focal inflammation or enteritis but infiltrative disease such as lymphoma cannot be excluded.   Electronically Signed   By: Marijo Conception, M.D.   On: 05/10/2015 17:41   I have personally reviewed and evaluated these images and lab results as part of my medical decision-making.   EKG Interpretation None      MDM   Final diagnoses:  Abdominal pain in female    abd pain,  Ct shows enteritis,  Admit for fluids and possible antibiotics.   Pt is a DNR    Milton Ferguson, MD 05/10/15 [phone]

## 2015-05-10 NOTE — ED Notes (Signed)
Bed: YO39 Expected date:  Expected time:  Means of arrival:  Comments: EMS/71F/abd pain

## 2015-05-11 ENCOUNTER — Encounter (HOSPITAL_COMMUNITY): Payer: Self-pay | Admitting: *Deleted

## 2015-05-11 DIAGNOSIS — R1084 Generalized abdominal pain: Secondary | ICD-10-CM

## 2015-05-11 DIAGNOSIS — A09 Infectious gastroenteritis and colitis, unspecified: Principal | ICD-10-CM

## 2015-05-11 DIAGNOSIS — F0390 Unspecified dementia without behavioral disturbance: Secondary | ICD-10-CM

## 2015-05-11 DIAGNOSIS — K5730 Diverticulosis of large intestine without perforation or abscess without bleeding: Secondary | ICD-10-CM

## 2015-05-11 DIAGNOSIS — I10 Essential (primary) hypertension: Secondary | ICD-10-CM

## 2015-05-11 LAB — COMPREHENSIVE METABOLIC PANEL
ALBUMIN: 3.5 g/dL (ref 3.5–5.0)
ALT: 9 U/L — AB (ref 14–54)
AST: 18 U/L (ref 15–41)
Alkaline Phosphatase: 69 U/L (ref 38–126)
Anion gap: 8 (ref 5–15)
BUN: 20 mg/dL (ref 6–20)
CHLORIDE: 105 mmol/L (ref 101–111)
CO2: 28 mmol/L (ref 22–32)
CREATININE: 0.91 mg/dL (ref 0.44–1.00)
Calcium: 9 mg/dL (ref 8.9–10.3)
GFR calc Af Amer: 60 mL/min — ABNORMAL LOW (ref >60)
GFR calc non Af Amer: 52 mL/min — ABNORMAL LOW (ref >60)
GLUCOSE: 100 mg/dL — AB (ref 65–99)
POTASSIUM: 3.6 mmol/L (ref 3.5–5.1)
SODIUM: 141 mmol/L (ref 135–145)
Total Bilirubin: 0.9 mg/dL (ref 0.3–1.2)
Total Protein: 6.4 g/dL — ABNORMAL LOW (ref 6.5–8.1)

## 2015-05-11 LAB — CBC
HCT: 31 % — ABNORMAL LOW (ref 36.0–46.0)
Hemoglobin: 9.9 g/dL — ABNORMAL LOW (ref 12.0–15.0)
MCH: 28.4 pg (ref 26.0–34.0)
MCHC: 31.9 g/dL (ref 30.0–36.0)
MCV: 89.1 fL (ref 78.0–100.0)
PLATELETS: 172 10*3/uL (ref 150–400)
RBC: 3.48 MIL/uL — AB (ref 3.87–5.11)
RDW: 16.1 % — AB (ref 11.5–15.5)
WBC: 6.1 10*3/uL (ref 4.0–10.5)

## 2015-05-11 LAB — GLUCOSE, CAPILLARY
GLUCOSE-CAPILLARY: 83 mg/dL (ref 65–99)
Glucose-Capillary: 97 mg/dL (ref 65–99)

## 2015-05-11 LAB — VITAMIN B12: Vitamin B-12: 910 pg/mL (ref 180–914)

## 2015-05-11 LAB — FERRITIN: Ferritin: 31 ng/mL (ref 11–307)

## 2015-05-11 LAB — TSH: TSH: 1.145 u[IU]/mL (ref 0.350–4.500)

## 2015-05-11 LAB — IRON AND TIBC
IRON: 41 ug/dL (ref 28–170)
SATURATION RATIOS: 12 % (ref 10.4–31.8)
TIBC: 344 ug/dL (ref 250–450)
UIBC: 303 ug/dL

## 2015-05-11 MED ORDER — ONDANSETRON HCL 4 MG PO TABS
4.0000 mg | ORAL_TABLET | Freq: Four times a day (QID) | ORAL | Status: DC | PRN
Start: 2015-05-11 — End: 2015-05-11

## 2015-05-11 MED ORDER — CALCIUM CARBONATE-VITAMIN D 500-200 MG-UNIT PO TABS
1.0000 | ORAL_TABLET | Freq: Every day | ORAL | Status: DC
  Administered 2015-05-11: 1 via ORAL
  Filled 2015-05-11: qty 1

## 2015-05-11 MED ORDER — POLYETHYLENE GLYCOL 3350 17 G PO PACK
17.0000 g | PACK | ORAL | Status: DC
  Filled 2015-05-11: qty 1

## 2015-05-11 MED ORDER — METRONIDAZOLE 500 MG PO TABS: 500.0000 mg | ORAL_TABLET | Freq: Three times a day (TID) | ORAL | Status: DC

## 2015-05-11 MED ORDER — FOLIC ACID 1 MG PO TABS
1.0000 mg | ORAL_TABLET | Freq: Every day | ORAL | Status: DC
  Administered 2015-05-11: 1 mg via ORAL
  Filled 2015-05-11: qty 1

## 2015-05-11 MED ORDER — CIPROFLOXACIN IN D5W 400 MG/200ML IV SOLN
400.0000 mg | INTRAVENOUS | Status: DC
Start: 2015-05-11 — End: 2015-05-11
  Filled 2015-05-11: qty 200

## 2015-05-11 MED ORDER — SODIUM CHLORIDE 0.9 % IV SOLN
INTRAVENOUS | Status: DC
  Administered 2015-05-11: 01:00:00 via INTRAVENOUS

## 2015-05-11 MED ORDER — POLYVINYL ALCOHOL 1.4 % OP SOLN
1.0000 [drp] | Freq: Two times a day (BID) | OPHTHALMIC | Status: DC | PRN
  Filled 2015-05-11: qty 15

## 2015-05-11 MED ORDER — VITAMIN B-12 1000 MCG PO TABS
1000.0000 ug | ORAL_TABLET | Freq: Every day | ORAL | Status: DC
  Administered 2015-05-11: 1000 ug via ORAL
  Filled 2015-05-11: qty 1

## 2015-05-11 MED ORDER — NITROGLYCERIN 0.4 MG SL SUBL: 0.4000 mg | SUBLINGUAL_TABLET | SUBLINGUAL | Status: DC | PRN

## 2015-05-11 MED ORDER — BOOST / RESOURCE BREEZE PO LIQD
1.0000 | Freq: Three times a day (TID) | ORAL | Status: DC
Start: 2015-05-11 — End: 2015-05-11
  Administered 2015-05-11: 1 via ORAL

## 2015-05-11 MED ORDER — DOCUSATE SODIUM 100 MG PO CAPS
100.0000 mg | ORAL_CAPSULE | Freq: Two times a day (BID) | ORAL | Status: DC
  Administered 2015-05-11: 100 mg via ORAL
  Filled 2015-05-11 (×2): qty 1

## 2015-05-11 MED ORDER — LISINOPRIL 10 MG PO TABS
10.0000 mg | ORAL_TABLET | Freq: Every day | ORAL | Status: DC
  Administered 2015-05-11: 10 mg via ORAL
  Filled 2015-05-11: qty 1

## 2015-05-11 MED ORDER — ISOSORBIDE MONONITRATE ER 30 MG PO TB24
30.0000 mg | ORAL_TABLET | Freq: Every day | ORAL | Status: DC
  Administered 2015-05-11: 30 mg via ORAL
  Filled 2015-05-11: qty 1

## 2015-05-11 MED ORDER — SUCRALFATE 1 GM/10ML PO SUSP
1.0000 g | Freq: Two times a day (BID) | ORAL | Status: DC
  Administered 2015-05-11: 1 g via ORAL
  Filled 2015-05-11 (×2): qty 10

## 2015-05-11 MED ORDER — VITAMIN B-1 100 MG PO TABS
100.0000 mg | ORAL_TABLET | Freq: Every day | ORAL | Status: DC
  Administered 2015-05-11: 100 mg via ORAL
  Filled 2015-05-11: qty 1

## 2015-05-11 MED ORDER — ACETAMINOPHEN 500 MG PO TABS: 500.0000 mg | ORAL_TABLET | Freq: Four times a day (QID) | ORAL | Status: DC | PRN

## 2015-05-11 MED ORDER — ONDANSETRON HCL 4 MG/2ML IJ SOLN: 4.0000 mg | Freq: Four times a day (QID) | INTRAMUSCULAR | Status: DC | PRN

## 2015-05-11 MED ORDER — SERTRALINE HCL 25 MG PO TABS
25.0000 mg | ORAL_TABLET | Freq: Every day | ORAL | Status: DC
  Administered 2015-05-11: 25 mg via ORAL
  Filled 2015-05-11: qty 1

## 2015-05-11 MED ORDER — CIPROFLOXACIN HCL 500 MG PO TABS: 500.0000 mg | ORAL_TABLET | Freq: Two times a day (BID) | ORAL | Status: DC

## 2015-05-11 MED ORDER — SALINE SPRAY 0.65 % NA SOLN
1.0000 | Freq: Every day | NASAL | Status: DC | PRN
  Filled 2015-05-11: qty 44

## 2015-05-11 MED ORDER — DONEPEZIL HCL 5 MG PO TABS
5.0000 mg | ORAL_TABLET | Freq: Every day | ORAL | Status: DC
  Filled 2015-05-11 (×2): qty 1

## 2015-05-11 MED ORDER — METRONIDAZOLE IN NACL 5-0.79 MG/ML-% IV SOLN
500.0000 mg | Freq: Three times a day (TID) | INTRAVENOUS | Status: DC
  Administered 2015-05-11 (×2): 500 mg via INTRAVENOUS
  Filled 2015-05-11 (×3): qty 100

## 2015-05-11 MED ORDER — HEPARIN SODIUM (PORCINE) 5000 UNIT/ML IJ SOLN
5000.0000 [IU] | Freq: Three times a day (TID) | INTRAMUSCULAR | Status: DC
  Administered 2015-05-11 (×2): 5000 [IU] via SUBCUTANEOUS
  Filled 2015-05-11 (×5): qty 1

## 2015-05-11 MED ORDER — LEVOTHYROXINE SODIUM 88 MCG PO TABS
88.0000 ug | ORAL_TABLET | Freq: Every day | ORAL | Status: DC
  Administered 2015-05-11: 88 ug via ORAL
  Filled 2015-05-11 (×2): qty 1

## 2015-05-11 MED ORDER — ADULT MULTIVITAMIN W/MINERALS CH
1.0000 | ORAL_TABLET | Freq: Every day | ORAL | Status: DC
  Administered 2015-05-11: 1 via ORAL
  Filled 2015-05-11: qty 1

## 2015-05-11 NOTE — Discharge Summary (Signed)
Physician Discharge Summary  [name] TML:[id number] DOB: 1917/03/31 DOA: 05/10/2015  PCP: Cathlean Cower, MD  Admit date: 05/10/2015 Discharge date: 05/11/2015  Time spent: 20 minutes  Recommendations for Outpatient Follow-up:  1. Follow up with PCP in 1-2 weeks  Discharge Diagnoses:  Principal Problem:   Enteritis, infectious, presumed Active Problems:   Hypothyroidism   Essential hypertension   Diverticulosis of large intestine   GERD (gastroesophageal reflux disease)   AKI (acute kidney injury)   Dementia   Abdominal pain   Discharge Condition: Improved  Diet recommendation: Regular  Filed Weights   05/10/15 2042  Weight: 41.9 kg (92 lb 6 oz)    History of present illness:  Please see admit h and p from 8/22 for details. Briefly, pt presented with abd pain with CT findings of enteritis noted. Pt also had a suspected vasovagal syncopal episode prior to hospital visit. The patient was admitted for further work up.  Hospital Course:  The patient was admitted to the floor. She tolerated a regular diet without difficulty. Patient was continued on empiric ciprofloxacin and flagyl. By the following day, the patient denied abd pain. Renal function remained stable. The patient was continued on basal IVF overnight. TSH was normal. Patient remained medically stable. Had considered PT/OT eval, however pt's daughter declined and stated home health, PT/OT is already scheduled for this week. Case was discussed with daughter over phone. Plan of care fully discussed and she was in full agreement with plan of care. All questions were answered. Daughter agreeable with discharge home.  Discharge Exam: Filed Vitals:   05/10/15 1752 05/10/15 2028 05/10/15 2042 05/11/15 1359  BP: 145/63 171/86 179/68 145/72  Pulse: 66 71 69 68  Temp:   98.2 F (36.8 C) 98.3 F (36.8 C)  TempSrc:   Oral Oral  Resp: 16 18 18 18   Weight:   41.9 kg (92 lb 6 oz)   SpO2: 100% 98% 100% 100%    General:  Awake, in nad Cardiovascular: regular, s1, s2 Respiratory: normal resp effort, no wheezing  Discharge Instructions     Medication List    STOP taking these medications        magnesium chloride 64 MG Tbec SR tablet  Commonly known as:  SLOW-MAG      TAKE these medications        acetaminophen 500 MG tablet  Commonly known as:  TYLENOL  Take 500 mg by mouth every 6 (six) hours as needed for moderate pain.     CALTRATE 600+D PLUS MINERALS 600-800 MG-UNIT Tabs  Take 1 tablet by mouth daily.     CARAFATE 1 GM/10ML suspension  Generic drug:  sucralfate  TAKE 10 MLS (1 G TOTAL) BY MOUTH 4 (FOUR) TIMES DAILY - WITH MEALS AND AT BEDTIME.     ciprofloxacin 500 MG tablet  Commonly known as:  CIPRO  Take 1 tablet (500 mg total) by mouth 2 (two) times daily.     cyanocobalamin 1000 MCG tablet  Commonly known as:  [employer] VITAMIN B12  Take 1 tablet (1,000 mcg total) by mouth daily.     donepezil 5 MG tablet  Commonly known as:  ARICEPT  Take 1 tablet (5 mg total) by mouth at bedtime.     feeding supplement Liqd  Take 1 Container by mouth 3 (three) times daily between meals.     isosorbide mononitrate 30 MG 24 hr tablet  Commonly known as:  IMDUR  Take 1 tablet (30 mg total) by mouth  daily.     levothyroxine 88 MCG tablet  Commonly known as:  SYNTHROID, LEVOTHROID  TAKE 1 TABLET (88 MCG TOTAL) BY MOUTH DAILY BEFORE BREAKFAST.     lisinopril 10 MG tablet  Commonly known as:  PRINIVIL,ZESTRIL  TAKE 1 TABLET EVERY DAY     metroNIDAZOLE 500 MG tablet  Commonly known as:  FLAGYL  Take 1 tablet (500 mg total) by mouth 3 (three) times daily.     nitroGLYCERIN 0.4 MG SL tablet  Commonly known as:  NITROSTAT  Place 1 tablet (0.4 mg total) under the tongue every 5 (five) minutes as needed. Chest pains     polyethylene glycol powder powder  Commonly known as:  MIRALAX  Take 17 g by mouth daily.     polyvinyl alcohol 1.4 % ophthalmic solution  Commonly known as:  LIQUIFILM  TEARS  Place 1 drop into both eyes 2 (two) times daily as needed for dry eyes.     sertraline 25 MG tablet  Commonly known as:  ZOLOFT  Take 1 tablet (25 mg total) by mouth daily.     sodium chloride 0.65 % Soln nasal spray  Commonly known as:  OCEAN  Place 1 spray into both nostrils daily as needed for congestion.       No Known Allergies    The results of significant diagnostics from this hospitalization (including imaging, microbiology, ancillary and laboratory) are listed below for reference.    Significant Diagnostic Studies: Ct Abdomen Pelvis W Contrast  05/10/2015   CLINICAL DATA:  Acute periumbilical abdominal pain.  EXAM: CT ABDOMEN AND PELVIS WITH CONTRAST  TECHNIQUE: Multidetector CT imaging of the abdomen and pelvis was performed using the standard protocol following bolus administration of intravenous contrast.  CONTRAST:  167mL OMNIPAQUE IOHEXOL 300 MG/ML SOLN, 20mL OMNIPAQUE IOHEXOL 300 MG/ML SOLN  COMPARISON:  CT scan of March 07, 2015.  FINDINGS: Visualized lung bases appear normal. No significant osseous abnormality is noted.  No gallstones are noted. No focal abnormality is noted in the liver, spleen or pancreas. Adrenal glands appear normal. Bilateral renal cysts are noted. No hydronephrosis or renal obstruction is noted. Atherosclerosis of abdominal aorta is noted without aneurysm formation. Status post appendectomy. Diffuse fold thickening of a portion of distal small bowel is noted in the pelvis, which results in mild dilatation of more proximal small bowel loops. This is most consistent with focal inflammation or enteritis. Small amount of free fluid is noted in the dependent portion of the pelvis. No colonic dilatation is noted. Urinary bladder appears normal. Phleboliths are noted in the pelvis. No significant adenopathy is noted.  IMPRESSION: Atherosclerosis of abdominal aorta is noted without aneurysm formation.  Diffuse fold thickening of a portion of small bowel is  noted posteriorly in the pelvis, with mild dilatation of the more proximal small bowel. This is most consistent with focal inflammation or enteritis but infiltrative disease such as lymphoma cannot be excluded.   Electronically Signed   By: Marijo Conception, M.D.   On: 05/10/2015 17:41    Microbiology: No results found for this or any previous visit (from the past 240 hour(s)).   Labs: Basic Metabolic Panel:  Recent Labs Lab 05/10/15 1604 05/11/15 0406  NA 142 141  K 3.6 3.6  CL 106 105  CO2 28 28  GLUCOSE 126* 100*  BUN 26* 20  CREATININE 1.11* 0.91  CALCIUM 9.1 9.0   Liver Function Tests:  Recent Labs Lab 05/10/15 1604 05/11/15 0406  AST  21 18  ALT 8* 9*  ALKPHOS 67 69  BILITOT 1.0 0.9  PROT 7.0 6.4*  ALBUMIN 3.6 3.5    Recent Labs Lab 05/10/15 1604  LIPASE 11*   No results for input(s): AMMONIA in the last 168 hours. CBC:  Recent Labs Lab 05/10/15 1604 05/11/15 0406  WBC 6.6 6.1  NEUTROABS 4.8  --   HGB 9.9* 9.9*  HCT 32.5* 31.0*  MCV 87.6 89.1  PLT 185 172   Cardiac Enzymes: No results for input(s): CKTOTAL, CKMB, CKMBINDEX, TROPONINI in the last 168 hours. BNP: BNP (last 3 results) No results for input(s): BNP in the last 8760 hours.  ProBNP (last 3 results) No results for input(s): PROBNP in the last 8760 hours.  CBG:  Recent Labs Lab 05/11/15 0737 05/11/15 1145  GLUCAP 83 97    Signed:  CHIU, STEPHEN K  [facility] 05/11/2015, 5:19 PM

## 2015-05-11 NOTE — Progress Notes (Signed)
ANTIBIOTIC CONSULT NOTE - INITIAL  Pharmacy Consult for Ciprofloxacin Indication: Intra-abdominal infection  No Known Allergies  Patient Measurements: Weight: 92 lb 6 oz (41.9 kg) Adjusted Body Weight:   Vital Signs: Temp: 98.2 F (36.8 C) (08/22 2042) Temp Source: Oral (08/22 2042) BP: 179/68 mmHg (08/22 2042) Pulse Rate: 69 (08/22 2042) Intake/Output from previous day:   Intake/Output from this shift:    Labs:  Recent Labs  05/10/15 1604  WBC 6.6  HGB 9.9*  PLT 185  CREATININE 1.11*   Estimated Creatinine Clearance: 19.6 mL/min (by C-G formula based on Cr of 1.11). No results for input(s): VANCOTROUGH, VANCOPEAK, VANCORANDOM, GENTTROUGH, GENTPEAK, GENTRANDOM, TOBRATROUGH, TOBRAPEAK, TOBRARND, AMIKACINPEAK, AMIKACINTROU, AMIKACIN in the last 72 hours.   Microbiology: No results found for this or any previous visit (from the past 720 hour(s)).  Medical History: Past Medical History  Diagnosis Date  . Internal hemorrhoid 07/2012  . Diverticulosis of colon (without mention of hemorrhage) 07/2012  . Herpes zoster   . Glaucoma   . Lumbar back pain   . Hypertension   . Cardiomyopathy   . Hypothyroid   . Myocardial infarction   . History of blood transfusion     "related to diverticulitis" (12/02/2013)  . GERD (gastroesophageal reflux disease)   . Arthritis     "joints" (12/02/2013)  . Depression   . Anxiety   . Adenomatous colon polyp 07/2012    sessile cecal polyp.  no high grade dysplasia.   Marland Kitchen Hyponatremia 07/2012    Na as low as 114.   . Protein calorie malnutrition 07/2012    BMI then: 16.5  . Zenker diverticulum 07/2012    this and esophageal dysmotility on esophagram.   . Psychosis   . Behavior disturbance     Medications:  Anti-infectives    Start     Dose/Rate Route Frequency Ordered Stop   05/11/15 2000  ciprofloxacin (CIPRO) IVPB 400 mg     400 mg 200 mL/hr over 60 Minutes Intravenous Every 24 hours 05/11/15 0045     05/11/15 0600   metroNIDAZOLE (FLAGYL) IVPB 500 mg     500 mg 100 mL/hr over 60 Minutes Intravenous Every 8 hours 05/11/15 0028     05/10/15 1945  ciprofloxacin (CIPRO) IVPB 400 mg     400 mg 200 mL/hr over 60 Minutes Intravenous  Once 05/10/15 1931 05/10/15 2125   05/10/15 1945  metroNIDAZOLE (FLAGYL) IVPB 500 mg     500 mg 100 mL/hr over 60 Minutes Intravenous  Once 05/10/15 1933 05/10/15 2314     Assessment: Patient with enteritis.  First dose of antibiotics already given.  Patient with very poor renal function, <40mL/min.  Goal of Therapy:  Cipro dosed based on patient weight and renal function   Plan:  Follow up culture results  Cipro 400mg  iv q24hr  Tyler Deis, Thao Vanover Crowford 05/11/2015,12:56 AM

## 2015-05-12 DIAGNOSIS — M545 Low back pain: Secondary | ICD-10-CM | POA: Diagnosis not present

## 2015-05-12 LAB — FOLATE RBC
FOLATE, HEMOLYSATE: 388.6 ng/mL
Folate, RBC: 1291 ng/mL (ref >498)
Hematocrit: 30.1 % — ABNORMAL LOW (ref 34.0–46.6)

## 2015-05-12 LAB — HEMOGLOBIN A1C
HEMOGLOBIN A1C: 5.7 % — AB (ref 4.8–5.6)
MEAN PLASMA GLUCOSE: 117 mg/dL

## 2015-05-13 ENCOUNTER — Telehealth: Payer: Self-pay | Admitting: *Deleted

## 2015-05-13 DIAGNOSIS — M545 Low back pain: Secondary | ICD-10-CM | POA: Diagnosis not present

## 2015-05-13 NOTE — Telephone Encounter (Signed)
Transition Care Management Follow-up Telephone Call   Date discharged? 05/11/15  How have you been since you were released from the hospital? Called pt spoke with her & son since she had a hard time hearing over the phone, but son states she is doing ok   Do you understand why you were in the hospital? YES   Do you understand the discharge instructions? YES   Where were you discharged to? Home   Items Reviewed:  Medications reviewed: YES  Allergies reviewed: YES  Dietary changes reviewed: NO  Referrals reviewed: No referral needed   Functional Questionnaire:   Activities of Daily Living (ADLs):   He states she are independent in the following: feeding and toileting Son states she require assistance with the following: ambulation, bathing and hygiene, grooming, toileting and dressing   Any transportation issues/concerns?: NO   Any patient concerns? NO   Confirmed importance and date/time of follow-up visits scheduled YES, made 05/19/15  Provider Appointment booked with Dr. Jenny Reichmann  Confirmed with patient if condition begins to worsen call PCP or go to the ER.  Patient was given the office number and encouraged to call back with question or concerns.  : YES

## 2015-05-14 DIAGNOSIS — M545 Low back pain: Secondary | ICD-10-CM | POA: Diagnosis not present

## 2015-05-17 ENCOUNTER — Other Ambulatory Visit: Payer: Self-pay | Admitting: Internal Medicine

## 2015-05-17 DIAGNOSIS — M545 Low back pain: Secondary | ICD-10-CM | POA: Diagnosis not present

## 2015-05-18 DIAGNOSIS — M545 Low back pain: Secondary | ICD-10-CM | POA: Diagnosis not present

## 2015-05-19 ENCOUNTER — Encounter: Payer: Self-pay | Admitting: Internal Medicine

## 2015-05-19 ENCOUNTER — Ambulatory Visit (INDEPENDENT_AMBULATORY_CARE_PROVIDER_SITE_OTHER): Payer: Commercial Managed Care - HMO | Admitting: Internal Medicine

## 2015-05-19 VITALS — BP 126/66 | HR 85 | Temp 99.0°F

## 2015-05-19 DIAGNOSIS — A09 Infectious gastroenteritis and colitis, unspecified: Secondary | ICD-10-CM

## 2015-05-19 DIAGNOSIS — K529 Noninfective gastroenteritis and colitis, unspecified: Secondary | ICD-10-CM

## 2015-05-19 DIAGNOSIS — E860 Dehydration: Secondary | ICD-10-CM

## 2015-05-19 DIAGNOSIS — M545 Low back pain: Secondary | ICD-10-CM | POA: Diagnosis not present

## 2015-05-19 DIAGNOSIS — I10 Essential (primary) hypertension: Secondary | ICD-10-CM | POA: Diagnosis not present

## 2015-05-19 MED ORDER — ISOSORBIDE MONONITRATE ER 30 MG PO TB24: 30.0000 mg | ORAL_TABLET | Freq: Every day | ORAL | Status: DC

## 2015-05-19 MED ORDER — CYANOCOBALAMIN 1000 MCG PO TABS: 1000.0000 ug | ORAL_TABLET | Freq: Every day | ORAL | Status: DC

## 2015-05-19 NOTE — Assessment & Plan Note (Signed)
Exam benign today, will hold on further tx

## 2015-05-19 NOTE — Assessment & Plan Note (Signed)
Seems to responded to tx, afeb, VSS, exam benign, still having several loose stools per day but does not appear dehydrated or worsening ill s/p antibx, ok to follow for any worsening s/s illness and immodium otc prn,  to f/u any worsening symptoms or concerns

## 2015-05-19 NOTE — Patient Instructions (Signed)
Please continue all other medications as before, and refills have been done if requested - the imdur and the b12  OK to take the Immodium OTC as needed for loose stools  Please have the pharmacy call with any other refills you may need.  Please keep your appointments with your specialists as you may have planned  Please return for any worsening signs such as fever, abd pain, blood or worsening diarrhea

## 2015-05-19 NOTE — Addendum Note (Signed)
Addended by: Biagio Borg on: 05/19/2015 05:22 PM   Modules accepted: Level of Service

## 2015-05-19 NOTE — Progress Notes (Signed)
Subjective:    Patient ID: [name], female    DOB: 08-08-1917, 81 y.o.   MRN: [id number]  HPI  Here to f/u hospn aug 22/23 with CT c/w small bowel enteritis without obstruction, tx with IV meds, then felt safe for d/c with po meds, now finished yesterday.  .Denies worsening reflux, abd pain, dysphagia, n/v, or blood, though has had 3-4 BM per day, no blood, already with 2 this am.  Pt alert, able to give hx and states "I feel all right today".  Daughter helps with hx, Pt denies chest pain, increased sob or doe, wheezing, orthopnea, PND, increased LE swelling, palpitations, dizziness or syncope.  Pt denies new neurological symptoms such as new headache, or facial or extremity weakness or numbness   Pt denies polydipsia, polyuria Past Medical History  Diagnosis Date  . Internal hemorrhoid 07/2012  . Diverticulosis of colon (without mention of hemorrhage) 07/2012  . Herpes zoster   . Glaucoma   . Lumbar back pain   . Hypertension   . Cardiomyopathy   . Hypothyroid   . Myocardial infarction   . History of blood transfusion     "related to diverticulitis" (12/02/2013)  . GERD (gastroesophageal reflux disease)   . Arthritis     "joints" (12/02/2013)  . Depression   . Anxiety   . Adenomatous colon polyp 07/2012    sessile cecal polyp.  no high grade dysplasia.   Marland Kitchen Hyponatremia 07/2012    Na as low as 114.   . Protein calorie malnutrition 07/2012    BMI then: 16.5  . Zenker diverticulum 07/2012    this and esophageal dysmotility on esophagram.   . Psychosis   . Behavior disturbance    Past Surgical History  Procedure Laterality Date  . Thyroidectomy    . Appendectomy  2005    ruptured appendix  . Tubal ligation    . Colonoscopy  07/19/2012    Procedure: COLONOSCOPY;  Surgeon: Ladene Artist, MD,FACG;  Location: [facility];  Service: Endoscopy;  Laterality: N/A;  . Cataract extraction w/ intraocular lens  implant, bilateral Bilateral   . Glaucoma surgery Right     reports  that she has never smoked. She has never used smokeless tobacco. She reports that she does not drink alcohol or use illicit drugs. family history includes Dementia in her sister; Heart attack in an other family member; Stroke in her daughter. There is no history of Colon cancer. No Known Allergies Current Outpatient Prescriptions on File Prior to Visit  Medication Sig Dispense Refill  . acetaminophen (TYLENOL) 500 MG tablet Take 500 mg by mouth every 6 (six) hours as needed for moderate pain.    . Calcium Carbonate-Vit D-Min (CALTRATE 600+D PLUS MINERALS) 600-800 MG-UNIT TABS Take 1 tablet by mouth daily.    Marland Kitchen CARAFATE 1 GM/10ML suspension TAKE 10 MLS (1 G TOTAL) BY MOUTH 4 (FOUR) TIMES DAILY - WITH MEALS AND AT BEDTIME. 420 mL 3  . cyanocobalamin ([employer]S VITAMIN B12) 1000 MCG tablet Take 1 tablet (1,000 mcg total) by mouth daily. 30 tablet 0  . donepezil (ARICEPT) 5 MG tablet Take 1 tablet (5 mg total) by mouth at bedtime. 30 tablet 0  . feeding supplement, RESOURCE BREEZE, (RESOURCE BREEZE) LIQD Take 1 Container by mouth 3 (three) times daily between meals. 90 Container 0  . isosorbide mononitrate (IMDUR) 30 MG 24 hr tablet Take 1 tablet (30 mg total) by mouth daily. 30 tablet 11  . isosorbide mononitrate (IMDUR) 30  MG 24 hr tablet TAKE 1 TABLET (30 MG TOTAL) BY MOUTH DAILY. 30 tablet 11  . levothyroxine (SYNTHROID, LEVOTHROID) 88 MCG tablet TAKE 1 TABLET (88 MCG TOTAL) BY MOUTH DAILY BEFORE BREAKFAST. 30 tablet 11  . lisinopril (PRINIVIL,ZESTRIL) 10 MG tablet TAKE 1 TABLET EVERY DAY (Patient taking differently: TAKE 1 TABLET BY MOUTH EVERY DAY) 30 tablet 11  . nitroGLYCERIN (NITROSTAT) 0.4 MG SL tablet Place 1 tablet (0.4 mg total) under the tongue every 5 (five) minutes as needed. Chest pains 30 tablet 6  . polyethylene glycol powder (MIRALAX) powder Take 17 g by mouth daily. (Patient taking differently: Take 17 g by mouth 3 (three) times a week. Mon, Wed, Fri.) 850 g 0  . polyvinyl alcohol  (LIQUIFILM TEARS) 1.4 % ophthalmic solution Place 1 drop into both eyes 2 (two) times daily as needed for dry eyes.    Marland Kitchen sertraline (ZOLOFT) 25 MG tablet Take 1 tablet (25 mg total) by mouth daily. 30 tablet 11  . sodium chloride (OCEAN) 0.65 % SOLN nasal spray Place 1 spray into both nostrils daily as needed for congestion.    . ciprofloxacin (CIPRO) 500 MG tablet Take 1 tablet (500 mg total) by mouth 2 (two) times daily. (Patient not taking: Reported on 05/19/2015) 10 tablet 0  . metroNIDAZOLE (FLAGYL) 500 MG tablet Take 1 tablet (500 mg total) by mouth 3 (three) times daily. (Patient not taking: Reported on 05/19/2015) 15 tablet 0  . [DISCONTINUED] isosorbide dinitrate (ISORDIL) 30 MG tablet Take 30 mg by mouth 3 (three) times daily.      . [DISCONTINUED] ranitidine (ZANTAC) 150 MG tablet Take 150 mg by mouth 2 (two) times daily.       No current facility-administered medications on file prior to visit.   Review of Systems  Constitutional: Negative for unusual diaphoresis or night sweats HENT: Negative for ringing in ear or discharge Eyes: Negative for double vision or worsening visual disturbance.  Respiratory: Negative for choking and stridor.   Gastrointestinal: Negative for vomiting or other signifcant bowel change Genitourinary: Negative for hematuria or change in urine volume.  Musculoskeletal: Negative for other MSK pain or swelling Skin: Negative for color change and worsening wound.  Neurological: Negative for tremors and numbness other than noted  Psychiatric/Behavioral: Negative for decreased concentration or agitation other than above       Objective:   Physical Exam BP 126/66 mmHg  Pulse 85  Temp(Src) 99 F (37.2 C) (Oral)  SpO2 97% VS noted, not ill appearing but elderly, fragile, HOH Constitutional: Pt appears in no significant distress HENT: Head: NCAT.  Right Ear: External ear normal.  Left Ear: External ear normal.  Eyes: . Pupils are equal, round, and reactive  to light. Conjunctivae and EOM are normal Neck: Normal range of motion. Neck supple.  Cardiovascular: Normal rate and regular rhythm.   Pulmonary/Chest: Effort normal and breath sounds without rales or wheezing.  Abd:  Soft, NT, ND, + BS - benign exam Neurological: Pt is alert. + baseline confused , motor grossly intact Skin: Skin is warm. No rash, no LE edema Psychiatric: Pt behavior is normal. No agitation. 1+ nervous    Assessment & Plan:

## 2015-05-19 NOTE — Progress Notes (Signed)
Pre visit review using our clinic review tool, if applicable. No additional management support is needed unless otherwise documented below in the visit note. 

## 2015-05-19 NOTE — Assessment & Plan Note (Signed)
stable overall by history and exam, recent data reviewed with pt, and pt to continue medical treatment as before,  to f/u any worsening symptoms or concerns  BP Readings from Last 3 Encounters:  05/19/15 126/66  05/11/15 145/72  03/16/15 148/70

## 2015-05-20 DIAGNOSIS — M545 Low back pain: Secondary | ICD-10-CM | POA: Diagnosis not present

## 2015-05-21 DIAGNOSIS — M545 Low back pain: Secondary | ICD-10-CM | POA: Diagnosis not present

## 2015-05-24 DIAGNOSIS — M545 Low back pain: Secondary | ICD-10-CM | POA: Diagnosis not present

## 2015-05-25 DIAGNOSIS — M545 Low back pain: Secondary | ICD-10-CM | POA: Diagnosis not present

## 2015-05-26 DIAGNOSIS — M545 Low back pain: Secondary | ICD-10-CM | POA: Diagnosis not present

## 2015-05-27 DIAGNOSIS — M545 Low back pain: Secondary | ICD-10-CM | POA: Diagnosis not present

## 2015-05-28 DIAGNOSIS — M545 Low back pain: Secondary | ICD-10-CM | POA: Diagnosis not present

## 2015-05-31 DIAGNOSIS — M545 Low back pain: Secondary | ICD-10-CM | POA: Diagnosis not present

## 2015-06-01 ENCOUNTER — Other Ambulatory Visit: Payer: Self-pay | Admitting: Internal Medicine

## 2015-06-01 DIAGNOSIS — M545 Low back pain: Secondary | ICD-10-CM | POA: Diagnosis not present

## 2015-06-02 DIAGNOSIS — M545 Low back pain: Secondary | ICD-10-CM | POA: Diagnosis not present

## 2015-06-03 DIAGNOSIS — M545 Low back pain: Secondary | ICD-10-CM | POA: Diagnosis not present

## 2015-06-04 DIAGNOSIS — M545 Low back pain: Secondary | ICD-10-CM | POA: Diagnosis not present

## 2015-06-07 DIAGNOSIS — M545 Low back pain: Secondary | ICD-10-CM | POA: Diagnosis not present

## 2015-06-08 DIAGNOSIS — M545 Low back pain: Secondary | ICD-10-CM | POA: Diagnosis not present

## 2015-06-09 DIAGNOSIS — M545 Low back pain: Secondary | ICD-10-CM | POA: Diagnosis not present

## 2015-06-10 DIAGNOSIS — M545 Low back pain: Secondary | ICD-10-CM | POA: Diagnosis not present

## 2015-06-11 DIAGNOSIS — M545 Low back pain: Secondary | ICD-10-CM | POA: Diagnosis not present

## 2015-06-14 DIAGNOSIS — M545 Low back pain: Secondary | ICD-10-CM | POA: Diagnosis not present

## 2015-06-15 DIAGNOSIS — M545 Low back pain: Secondary | ICD-10-CM | POA: Diagnosis not present

## 2015-06-16 ENCOUNTER — Ambulatory Visit: Payer: Medicare HMO | Admitting: Internal Medicine

## 2015-06-16 DIAGNOSIS — M545 Low back pain: Secondary | ICD-10-CM | POA: Diagnosis not present

## 2015-06-17 DIAGNOSIS — M545 Low back pain: Secondary | ICD-10-CM | POA: Diagnosis not present

## 2015-06-18 DIAGNOSIS — M545 Low back pain: Secondary | ICD-10-CM | POA: Diagnosis not present

## 2015-06-19 ENCOUNTER — Emergency Department (HOSPITAL_COMMUNITY): Payer: Commercial Managed Care - HMO

## 2015-06-19 ENCOUNTER — Encounter (HOSPITAL_COMMUNITY): Payer: Self-pay

## 2015-06-19 ENCOUNTER — Emergency Department (HOSPITAL_COMMUNITY)
Admission: EM | Admit: 2015-06-19 | Discharge: 2015-06-19 | Disposition: A | Discharge status: Home / Self Care | Payer: Commercial Managed Care - HMO | Attending: Emergency Medicine | Admitting: Emergency Medicine

## 2015-06-19 DIAGNOSIS — Y9389 Activity, other specified: Secondary | ICD-10-CM | POA: Insufficient documentation

## 2015-06-19 DIAGNOSIS — Z8669 Personal history of other diseases of the nervous system and sense organs: Secondary | ICD-10-CM | POA: Diagnosis not present

## 2015-06-19 DIAGNOSIS — Y998 Other external cause status: Secondary | ICD-10-CM | POA: Diagnosis not present

## 2015-06-19 DIAGNOSIS — E871 Hypo-osmolality and hyponatremia: Secondary | ICD-10-CM | POA: Diagnosis not present

## 2015-06-19 DIAGNOSIS — E46 Unspecified protein-calorie malnutrition: Secondary | ICD-10-CM | POA: Diagnosis not present

## 2015-06-19 DIAGNOSIS — Z86010 Personal history of colonic polyps: Secondary | ICD-10-CM | POA: Diagnosis not present

## 2015-06-19 DIAGNOSIS — S299XXA Unspecified injury of thorax, initial encounter: Secondary | ICD-10-CM | POA: Insufficient documentation

## 2015-06-19 DIAGNOSIS — Z8679 Personal history of other diseases of the circulatory system: Secondary | ICD-10-CM | POA: Diagnosis not present

## 2015-06-19 DIAGNOSIS — I252 Old myocardial infarction: Secondary | ICD-10-CM | POA: Diagnosis not present

## 2015-06-19 DIAGNOSIS — W19XXXA Unspecified fall, initial encounter: Secondary | ICD-10-CM | POA: Diagnosis not present

## 2015-06-19 DIAGNOSIS — R0789 Other chest pain: Secondary | ICD-10-CM

## 2015-06-19 DIAGNOSIS — Z9883 Filtering (vitreous) bleb after glaucoma surgery status: Secondary | ICD-10-CM | POA: Insufficient documentation

## 2015-06-19 DIAGNOSIS — F329 Major depressive disorder, single episode, unspecified: Secondary | ICD-10-CM | POA: Insufficient documentation

## 2015-06-19 DIAGNOSIS — E039 Hypothyroidism, unspecified: Secondary | ICD-10-CM | POA: Diagnosis not present

## 2015-06-19 DIAGNOSIS — Z79899 Other long term (current) drug therapy: Secondary | ICD-10-CM | POA: Diagnosis not present

## 2015-06-19 DIAGNOSIS — Z8719 Personal history of other diseases of the digestive system: Secondary | ICD-10-CM | POA: Insufficient documentation

## 2015-06-19 DIAGNOSIS — I10 Essential (primary) hypertension: Secondary | ICD-10-CM | POA: Diagnosis not present

## 2015-06-19 DIAGNOSIS — F919 Conduct disorder, unspecified: Secondary | ICD-10-CM | POA: Diagnosis not present

## 2015-06-19 DIAGNOSIS — K219 Gastro-esophageal reflux disease without esophagitis: Secondary | ICD-10-CM | POA: Diagnosis not present

## 2015-06-19 DIAGNOSIS — Y92009 Unspecified place in unspecified non-institutional (private) residence as the place of occurrence of the external cause: Secondary | ICD-10-CM | POA: Diagnosis not present

## 2015-06-19 DIAGNOSIS — F419 Anxiety disorder, unspecified: Secondary | ICD-10-CM | POA: Insufficient documentation

## 2015-06-19 MED ORDER — TRAMADOL HCL 50 MG PO TABS: 50.0000 mg | ORAL_TABLET | Freq: Two times a day (BID) | ORAL | Status: DC | PRN

## 2015-06-19 MED ORDER — OXYCODONE-ACETAMINOPHEN 5-325 MG PO TABS
1.0000 | ORAL_TABLET | Freq: Once | ORAL | Status: AC
  Administered 2015-06-19: 1 via ORAL
  Filled 2015-06-19: qty 1

## 2015-06-19 NOTE — ED Notes (Signed)
She had unwitnessed fall at home.  Was found face-down shortly after having fallen.  She c/o pain at right lat. Ribs area and sm. Abrasions at right hand/wrist (very superficial).  She is in no distress.

## 2015-06-19 NOTE — Discharge Instructions (Signed)

## 2015-06-21 DIAGNOSIS — M545 Low back pain: Secondary | ICD-10-CM | POA: Diagnosis not present

## 2015-06-22 DIAGNOSIS — M545 Low back pain: Secondary | ICD-10-CM | POA: Diagnosis not present

## 2015-06-23 DIAGNOSIS — M545 Low back pain: Secondary | ICD-10-CM | POA: Diagnosis not present

## 2015-06-24 DIAGNOSIS — M545 Low back pain: Secondary | ICD-10-CM | POA: Diagnosis not present

## 2015-06-25 DIAGNOSIS — M545 Low back pain: Secondary | ICD-10-CM | POA: Diagnosis not present

## 2015-06-27 NOTE — ED Provider Notes (Signed)
CSN: [id number]     Arrival date & time 06/19/15  1610 History   First MD Initiated Contact with Patient 06/19/15 1618     Chief Complaint  Patient presents with  . Fall     (Consider location/radiation/quality/duration/timing/severity/associated sxs/prior Treatment) HPI   23 rolled female with left chest pain. Patient had an unwitnessed fall at home. Happened just before arrival. Patient states she just lost her balance. Denies significant pain anywhere else. He has family at bedside. They report that she is at her baseline in terms her mental status. No vomiting. No blood thinners. No other acute complaints.  Past Medical History  Diagnosis Date  . Internal hemorrhoid 07/2012  . Diverticulosis of colon (without mention of hemorrhage) 07/2012  . Herpes zoster   . Glaucoma   . Lumbar back pain   . Hypertension   . Cardiomyopathy   . Hypothyroid   . Myocardial infarction ([place])   . History of blood transfusion     "related to diverticulitis" (12/02/2013)  . GERD (gastroesophageal reflux disease)   . Arthritis     "joints" (12/02/2013)  . Depression   . Anxiety   . Adenomatous colon polyp 07/2012    sessile cecal polyp.  no high grade dysplasia.   Marland Kitchen Hyponatremia 07/2012    Na as low as 114.   . Protein calorie malnutrition ([place]) 07/2012    BMI then: 16.5  . Zenker diverticulum 07/2012    this and esophageal dysmotility on esophagram.   . Psychosis   . Behavior disturbance    Past Surgical History  Procedure Laterality Date  . Thyroidectomy    . Appendectomy  2005    ruptured appendix  . Tubal ligation    . Colonoscopy  07/19/2012    Procedure: COLONOSCOPY;  Surgeon: Ladene Artist, MD,FACG;  Location: [facility];  Service: Endoscopy;  Laterality: N/A;  . Cataract extraction w/ intraocular lens  implant, bilateral Bilateral   . Glaucoma surgery Right    Family History  Problem Relation Age of Onset  . Colon cancer Neg Hx   . Stroke Daughter   . Heart attack    .  Dementia Sister    Social History  Substance Use Topics  . Smoking status: Never Smoker   . Smokeless tobacco: Never Used  . Alcohol Use: No   OB History    No data available     Review of Systems  All systems reviewed and negative, other than as noted in HPI.   Allergies  Review of patient's allergies indicates no known allergies.  Home Medications   Prior to Admission medications   Medication Sig Start Date End Date Taking? Authorizing Provider  acetaminophen (TYLENOL) 500 MG tablet Take 500 mg by mouth every 6 (six) hours as needed for moderate pain.   Yes Historical Provider, MD  Calcium Carbonate-Vit D-Min (CALTRATE 600+D PLUS MINERALS) 600-800 MG-UNIT TABS Take 1 tablet by mouth daily.   Yes Historical Provider, MD  CARAFATE 1 GM/10ML suspension TAKE 10 MLS (1 G TOTAL) BY MOUTH 4 (FOUR) TIMES DAILY - WITH MEALS AND AT BEDTIME. 05/10/15  Yes Biagio Borg, MD  cyanocobalamin ([employer] VITAMIN B12) 1000 MCG tablet Take 1 tablet (1,000 mcg total) by mouth daily. 05/19/15  Yes Biagio Borg, MD  donepezil (ARICEPT) 5 MG tablet Take 1 tablet (5 mg total) by mouth at bedtime. 03/09/15  Yes Janece Canterbury, MD  feeding supplement, RESOURCE BREEZE, (RESOURCE BREEZE) LIQD Take 1 Container by mouth 3 (  three) times daily between meals. 03/09/15  Yes Janece Canterbury, MD  isosorbide mononitrate (IMDUR) 30 MG 24 hr tablet Take 1 tablet (30 mg total) by mouth daily. 05/19/15  Yes Biagio Borg, MD  levothyroxine (SYNTHROID, LEVOTHROID) 88 MCG tablet TAKE 1 TABLET (88 MCG TOTAL) BY MOUTH DAILY BEFORE BREAKFAST. 03/25/15  Yes Biagio Borg, MD  lisinopril (PRINIVIL,ZESTRIL) 10 MG tablet TAKE 1 TABLET EVERY DAY Patient taking differently: TAKE 1 TABLET BY MOUTH EVERY DAY 03/25/15  Yes Biagio Borg, MD  loratadine (CLARITIN) 10 MG tablet Take 10 mg by mouth daily as needed for allergies.   Yes Historical Provider, MD  polyethylene glycol powder (MIRALAX) powder Take 17 g by mouth daily. Patient taking  differently: Take 17 g by mouth 3 (three) times a week. Mon, Wed, Fri. 03/09/15  Yes Janece Canterbury, MD  polyvinyl alcohol (LIQUIFILM TEARS) 1.4 % ophthalmic solution Place 1 drop into both eyes 2 (two) times daily as needed for dry eyes.   Yes Historical Provider, MD  sertraline (ZOLOFT) 25 MG tablet TAKE 1 TABLET (25 MG TOTAL) BY MOUTH DAILY. 06/01/15  Yes Biagio Borg, MD  sodium chloride (OCEAN) 0.65 % SOLN nasal spray Place 1 spray into both nostrils daily as needed for congestion.   Yes Historical Provider, MD  nitroGLYCERIN (NITROSTAT) 0.4 MG SL tablet Place 1 tablet (0.4 mg total) under the tongue every 5 (five) minutes as needed. Chest pains 10/20/14   Biagio Borg, MD  traMADol (ULTRAM) 50 MG tablet Take 1 tablet (50 mg total) by mouth every 12 (twelve) hours as needed for severe pain. 06/19/15   Virgel Manifold, MD   BP 176/85 mmHg  Pulse 67  Temp(Src) 99.6 F (37.6 C) (Oral)  Resp 16  SpO2 97% Physical Exam  Constitutional: She appears well-developed and well-nourished. No distress.  HENT:  Head: Normocephalic and atraumatic.  Eyes: Conjunctivae are normal. Right eye exhibits no discharge. Left eye exhibits no discharge.  Neck: Neck supple.  Cardiovascular: Normal rate, regular rhythm and normal heart sounds.  Exam reveals no gallop and no friction rub.   No murmur heard. Pulmonary/Chest: Effort normal and breath sounds normal. No respiratory distress. She exhibits tenderness.  Tenderness along the left chest wall. No concerning skin lesions. No crepitus. Breath sounds are symmetric bilaterally. Chest rises symmetric.  Abdominal: Soft. She exhibits no distension. There is no tenderness.  Musculoskeletal: She exhibits no edema or tenderness.  No midline spinal tenderness. No bony tenderness of the extremities or pain with range of motion of the large joints saide from the left shoulder which she complains of increased left chest pain with movement but not pain in the shoulder itself.   Neurological: She is alert.  Skin: Skin is warm and dry.  Psychiatric: She has a normal mood and affect. Her behavior is normal. Thought content normal.  Nursing note and vitals reviewed.   ED Course  Procedures (including critical care time) Labs Review Labs Reviewed - No data to display  Imaging Review No results found.   Dg Ribs Unilateral W/chest Left  06/19/2015   CLINICAL DATA:  Initial encounter for unwitnessed fall at home.  EXAM: LEFT RIBS AND CHEST - 3+ VIEW  COMPARISON:  09-Feb-202016  FINDINGS: Frontal view of the chest and three views of left-sided ribs. The frontal view of the chest is mild to moderately degraded by a rotation. Osteopenia. Midline trachea. Moderate cardiomegaly. Tortuous, likely ectatic thoracic aorta. No pleural effusion or pneumothorax. Convex left  thoracic spine curvature.  Four views of left-sided ribs. No displaced rib fracture is identified. Suspect loss of thoracic vertebral body height, incompletely evaluated.  IMPRESSION: Osteopenia, without displaced rib fracture.  Cardiomegaly with a tortuous thoracic aorta. No hemothorax or pneumothorax identified.   Electronically Signed   By: Abigail Miyamoto M.D.   On: 06/19/2015 17:29   I have personally reviewed and evaluated these images and lab results as part of my medical decision-making.   EKG Interpretation None      MDM   Final diagnoses:  Left-sided chest wall pain    81 year old female with left chest pain after fall. Triage note was reviewed and did note that mentioning right-sided pain. I had a different history and exam though. Imaging is negative for acute fracture. She has no midline spinal tenderness. She is reportedly at her baseline. Low suspicion for serious acute traumatic injury. Plan symptomatic treatment of pain.    Virgel Manifold, MD 06/27/15 2152

## 2015-06-28 DIAGNOSIS — M545 Low back pain: Secondary | ICD-10-CM | POA: Diagnosis not present

## 2015-06-29 DIAGNOSIS — M545 Low back pain: Secondary | ICD-10-CM | POA: Diagnosis not present

## 2015-06-30 DIAGNOSIS — M545 Low back pain: Secondary | ICD-10-CM | POA: Diagnosis not present

## 2015-07-01 DIAGNOSIS — M545 Low back pain: Secondary | ICD-10-CM | POA: Diagnosis not present

## 2015-07-02 DIAGNOSIS — M545 Low back pain: Secondary | ICD-10-CM | POA: Diagnosis not present

## 2015-07-05 DIAGNOSIS — N19 Unspecified kidney failure: Secondary | ICD-10-CM | POA: Diagnosis not present

## 2015-07-06 DIAGNOSIS — N19 Unspecified kidney failure: Secondary | ICD-10-CM | POA: Diagnosis not present

## 2015-07-07 DIAGNOSIS — N19 Unspecified kidney failure: Secondary | ICD-10-CM | POA: Diagnosis not present

## 2015-07-08 DIAGNOSIS — N19 Unspecified kidney failure: Secondary | ICD-10-CM | POA: Diagnosis not present

## 2015-07-09 DIAGNOSIS — N19 Unspecified kidney failure: Secondary | ICD-10-CM | POA: Diagnosis not present

## 2015-07-12 DIAGNOSIS — N19 Unspecified kidney failure: Secondary | ICD-10-CM | POA: Diagnosis not present

## 2015-07-13 DIAGNOSIS — N19 Unspecified kidney failure: Secondary | ICD-10-CM | POA: Diagnosis not present

## 2015-07-14 DIAGNOSIS — N19 Unspecified kidney failure: Secondary | ICD-10-CM | POA: Diagnosis not present

## 2015-07-15 DIAGNOSIS — N19 Unspecified kidney failure: Secondary | ICD-10-CM | POA: Diagnosis not present

## 2015-07-16 DIAGNOSIS — N19 Unspecified kidney failure: Secondary | ICD-10-CM | POA: Diagnosis not present

## 2015-07-19 DIAGNOSIS — N19 Unspecified kidney failure: Secondary | ICD-10-CM | POA: Diagnosis not present

## 2015-07-20 DIAGNOSIS — N19 Unspecified kidney failure: Secondary | ICD-10-CM | POA: Diagnosis not present

## 2015-07-21 DIAGNOSIS — N19 Unspecified kidney failure: Secondary | ICD-10-CM | POA: Diagnosis not present

## 2015-07-22 DIAGNOSIS — N19 Unspecified kidney failure: Secondary | ICD-10-CM | POA: Diagnosis not present

## 2015-07-23 DIAGNOSIS — N19 Unspecified kidney failure: Secondary | ICD-10-CM | POA: Diagnosis not present

## 2015-07-26 DIAGNOSIS — N19 Unspecified kidney failure: Secondary | ICD-10-CM | POA: Diagnosis not present

## 2015-07-27 DIAGNOSIS — N19 Unspecified kidney failure: Secondary | ICD-10-CM | POA: Diagnosis not present

## 2015-07-28 DIAGNOSIS — N19 Unspecified kidney failure: Secondary | ICD-10-CM | POA: Diagnosis not present

## 2015-07-29 DIAGNOSIS — N19 Unspecified kidney failure: Secondary | ICD-10-CM | POA: Diagnosis not present

## 2015-07-30 DIAGNOSIS — N19 Unspecified kidney failure: Secondary | ICD-10-CM | POA: Diagnosis not present

## 2015-08-02 DIAGNOSIS — N19 Unspecified kidney failure: Secondary | ICD-10-CM | POA: Diagnosis not present

## 2015-08-03 DIAGNOSIS — N19 Unspecified kidney failure: Secondary | ICD-10-CM | POA: Diagnosis not present

## 2015-08-04 DIAGNOSIS — N19 Unspecified kidney failure: Secondary | ICD-10-CM | POA: Diagnosis not present

## 2015-08-05 DIAGNOSIS — N19 Unspecified kidney failure: Secondary | ICD-10-CM | POA: Diagnosis not present

## 2015-08-06 DIAGNOSIS — N19 Unspecified kidney failure: Secondary | ICD-10-CM | POA: Diagnosis not present

## 2015-08-09 DIAGNOSIS — N19 Unspecified kidney failure: Secondary | ICD-10-CM | POA: Diagnosis not present

## 2015-08-10 DIAGNOSIS — N19 Unspecified kidney failure: Secondary | ICD-10-CM | POA: Diagnosis not present

## 2015-08-11 ENCOUNTER — Emergency Department (HOSPITAL_COMMUNITY)
Admission: EM | Admit: 2015-08-11 | Discharge: 2015-08-11 | Disposition: A | Discharge status: Home / Self Care | Payer: Commercial Managed Care - HMO | Attending: Emergency Medicine | Admitting: Emergency Medicine

## 2015-08-11 ENCOUNTER — Emergency Department (HOSPITAL_COMMUNITY): Payer: Commercial Managed Care - HMO

## 2015-08-11 ENCOUNTER — Encounter (HOSPITAL_COMMUNITY): Payer: Self-pay | Admitting: Emergency Medicine

## 2015-08-11 DIAGNOSIS — N19 Unspecified kidney failure: Secondary | ICD-10-CM | POA: Diagnosis not present

## 2015-08-11 DIAGNOSIS — F329 Major depressive disorder, single episode, unspecified: Secondary | ICD-10-CM | POA: Diagnosis not present

## 2015-08-11 DIAGNOSIS — R5383 Other fatigue: Secondary | ICD-10-CM | POA: Diagnosis not present

## 2015-08-11 DIAGNOSIS — R402421 Glasgow coma scale score 9-12, in the field [EMT or ambulance]: Secondary | ICD-10-CM | POA: Diagnosis not present

## 2015-08-11 DIAGNOSIS — Z86010 Personal history of colonic polyps: Secondary | ICD-10-CM | POA: Diagnosis not present

## 2015-08-11 DIAGNOSIS — R4182 Altered mental status, unspecified: Secondary | ICD-10-CM | POA: Diagnosis not present

## 2015-08-11 DIAGNOSIS — E039 Hypothyroidism, unspecified: Secondary | ICD-10-CM | POA: Insufficient documentation

## 2015-08-11 DIAGNOSIS — R1110 Vomiting, unspecified: Secondary | ICD-10-CM | POA: Diagnosis not present

## 2015-08-11 DIAGNOSIS — R55 Syncope and collapse: Secondary | ICD-10-CM | POA: Diagnosis present

## 2015-08-11 DIAGNOSIS — I10 Essential (primary) hypertension: Secondary | ICD-10-CM | POA: Diagnosis not present

## 2015-08-11 DIAGNOSIS — F419 Anxiety disorder, unspecified: Secondary | ICD-10-CM | POA: Insufficient documentation

## 2015-08-11 DIAGNOSIS — Z8619 Personal history of other infectious and parasitic diseases: Secondary | ICD-10-CM | POA: Diagnosis not present

## 2015-08-11 DIAGNOSIS — I252 Old myocardial infarction: Secondary | ICD-10-CM | POA: Diagnosis not present

## 2015-08-11 DIAGNOSIS — Z8669 Personal history of other diseases of the nervous system and sense organs: Secondary | ICD-10-CM | POA: Diagnosis not present

## 2015-08-11 DIAGNOSIS — R110 Nausea: Secondary | ICD-10-CM | POA: Diagnosis not present

## 2015-08-11 DIAGNOSIS — K219 Gastro-esophageal reflux disease without esophagitis: Secondary | ICD-10-CM | POA: Insufficient documentation

## 2015-08-11 DIAGNOSIS — M1990 Unspecified osteoarthritis, unspecified site: Secondary | ICD-10-CM | POA: Diagnosis not present

## 2015-08-11 DIAGNOSIS — Z79899 Other long term (current) drug therapy: Secondary | ICD-10-CM | POA: Diagnosis not present

## 2015-08-11 LAB — COMPREHENSIVE METABOLIC PANEL
ALBUMIN: 3.3 g/dL — AB (ref 3.5–5.0)
ALK PHOS: 71 U/L (ref 38–126)
ALT: 13 U/L — AB (ref 14–54)
ANION GAP: 8 (ref 5–15)
AST: 22 U/L (ref 15–41)
BILIRUBIN TOTAL: 0.7 mg/dL (ref 0.3–1.2)
BUN: 20 mg/dL (ref 6–20)
CALCIUM: 9.5 mg/dL (ref 8.9–10.3)
CO2: 27 mmol/L (ref 22–32)
CREATININE: 1.08 mg/dL — AB (ref 0.44–1.00)
Chloride: 107 mmol/L (ref 101–111)
GFR calc Af Amer: 49 mL/min — ABNORMAL LOW (ref >60)
GFR calc non Af Amer: 42 mL/min — ABNORMAL LOW (ref >60)
GLUCOSE: 114 mg/dL — AB (ref 65–99)
Potassium: 3.6 mmol/L (ref 3.5–5.1)
Sodium: 142 mmol/L (ref 135–145)
TOTAL PROTEIN: 6.7 g/dL (ref 6.5–8.1)

## 2015-08-11 LAB — I-STAT TROPONIN, ED: TROPONIN I, POC: 0.01 ng/mL (ref 0.00–0.08)

## 2015-08-11 LAB — CBC
HEMATOCRIT: 34.4 % — AB (ref 36.0–46.0)
HEMOGLOBIN: 11.2 g/dL — AB (ref 12.0–15.0)
MCH: 28.4 pg (ref 26.0–34.0)
MCHC: 32.6 g/dL (ref 30.0–36.0)
MCV: 87.3 fL (ref 78.0–100.0)
Platelets: 172 10*3/uL (ref 150–400)
RBC: 3.94 MIL/uL (ref 3.87–5.11)
RDW: 15.7 % — ABNORMAL HIGH (ref 11.5–15.5)
WBC: 5.5 10*3/uL (ref 4.0–10.5)

## 2015-08-11 LAB — LIPASE, BLOOD: Lipase: 25 U/L (ref 11–51)

## 2015-08-11 LAB — I-STAT CG4 LACTIC ACID, ED
Lactic Acid, Venous: 1.56 mmol/L (ref 0.5–2.0)
Lactic Acid, Venous: 1.11 mmol/L (ref 0.5–2.0)

## 2015-08-11 NOTE — ED Notes (Signed)
Patient's family verbalized understanding of discharge instructions (pt hx dementia) and denies any further needs nor questions at this time. VSS.

## 2015-08-11 NOTE — Discharge Instructions (Signed)
Change in mental status  The exact cause of the episode you had today of feeling weak and sick followed by spitting up or vomiting is unclear at this time.  Return immediately if you begin to feel ill, have a change in your mental status, have sudden weakness, fever, or change in speech.  Follow up with your primary care physician for reevaluation in a few days.  Rest at home and do not partake in any strenuous activities.

## 2015-08-11 NOTE — ED Notes (Signed)
Spoke with patient's daughter, who witnessed vomiting episode, and received more information regarding initial incident.  Per daughter, patient was sitting in chair, leaned over a little, and vomited up "thick white fluid," shortly after finishing Vanilla Ensure drink.  Per daughter, patient did pass out, nor lose consciousness; instead, patient vagals from time to time, especially during bowel movements and vomiting. MD made aware of inconsistency from EMS report.

## 2015-08-11 NOTE — ED Notes (Signed)
MD at bedside. 

## 2015-08-11 NOTE — ED Notes (Signed)
Patient's daughter arrived. Patient speaking some words now. Per daughter, patient does speak a little but has worsened since hx dementia.

## 2015-08-11 NOTE — ED Notes (Signed)
PER EMS: Patient comes from home following syncopal episode witnessed by caregiver.  Patient was sitting in chair, began having "total body shaking" and vomited up "milk-like" emesis. Per EMS, caregiver states she had just drank an Ensure and did not fall. Upon EMS arrival, patient was alert in chair and returned to baseline en route - patient hx dementia and is non-verbal. Yellow DNR form at bedside.  EMS VS: CBG 141, HR 77, 148/80, initially 80% RA, EMS placed pt on 2L O2 [place], and patient quickly went up to 100%. Patient does not appear to have been incontinent bladder or bowel.  Patient seems lethargic, but is arousable and responds to voice. Respirations e/u.

## 2015-08-13 DIAGNOSIS — N19 Unspecified kidney failure: Secondary | ICD-10-CM | POA: Diagnosis not present

## 2015-08-14 NOTE — ED Provider Notes (Signed)
CSN: [id number]     Arrival date & time 08/11/15  1539 History   First MD Initiated Contact with Patient 08/11/15 1543     Chief Complaint  Patient presents with  . Loss of Consciousness  . Emesis     (Consider location/radiation/quality/duration/timing/severity/associated sxs/prior Treatment) HPI Comments: 81 y.o. Female with history of HTN, CM, GERD presents for change in mental status and vomiting episode.  The patient's daughter who lives with her states that she had just returned home from work and that the patient was sitting in the chair she usually does but seemed more tired and less talkative than usual.  She said the caregiver who had just left reported the patient had had a normal day.  The daughter says she thought her mother might just need to lay down or need something to eat and was trying to ask her this when her mother suddenly vomited and then seemed to be out of it with her eyes rolled back.  The daughter states the patient did not appear to pass out or stop breathing.  She did not turn blue but looked pale.  The patient had just drank a vanilla ensure which the daughter states the vomit looked like.  The patient on initial evaluation appeared fatigued and did not give much history but on reevaluation was talkative, alert, and denied any complaints.  Patient is a 81 y.o. female presenting with syncope and vomiting.  Loss of Consciousness Associated symptoms: vomiting   Associated symptoms: no chest pain, no diaphoresis, no fever, no nausea, no palpitations, no seizures, no shortness of breath and no weakness   Emesis Associated symptoms: no abdominal pain, no diarrhea and no myalgias     Past Medical History  Diagnosis Date  . Internal hemorrhoid 07/2012  . Diverticulosis of colon (without mention of hemorrhage) 07/2012  . Herpes zoster   . Glaucoma   . Lumbar back pain   . Hypertension   . Cardiomyopathy   . Hypothyroid   . Myocardial infarction ([place])   . History  of blood transfusion     "related to diverticulitis" (12/02/2013)  . GERD (gastroesophageal reflux disease)   . Arthritis     "joints" (12/02/2013)  . Depression   . Anxiety   . Adenomatous colon polyp 07/2012    sessile cecal polyp.  no high grade dysplasia.   Marland Kitchen Hyponatremia 07/2012    Na as low as 114.   . Protein calorie malnutrition ([place]) 07/2012    BMI then: 16.5  . Zenker diverticulum 07/2012    this and esophageal dysmotility on esophagram.   . Psychosis   . Behavior disturbance    Past Surgical History  Procedure Laterality Date  . Thyroidectomy    . Appendectomy  2005    ruptured appendix  . Tubal ligation    . Colonoscopy  07/19/2012    Procedure: COLONOSCOPY;  Surgeon: Ladene Artist, MD,FACG;  Location: [facility];  Service: Endoscopy;  Laterality: N/A;  . Cataract extraction w/ intraocular lens  implant, bilateral Bilateral   . Glaucoma surgery Right    Family History  Problem Relation Age of Onset  . Colon cancer Neg Hx   . Stroke Daughter   . Heart attack    . Dementia Sister    Social History  Substance Use Topics  . Smoking status: Never Smoker   . Smokeless tobacco: Never Used  . Alcohol Use: No   OB History    No data available  Review of Systems  Constitutional: Positive for fatigue. Negative for fever, diaphoresis and appetite change.  HENT: Negative for congestion, drooling, postnasal drip and rhinorrhea.   Eyes: Negative for pain and redness.  Respiratory: Negative for cough, chest tightness and shortness of breath.   Cardiovascular: Positive for syncope. Negative for chest pain and palpitations.  Gastrointestinal: Positive for vomiting. Negative for nausea, abdominal pain, diarrhea and constipation.  Genitourinary: Negative for dysuria, urgency, frequency and hematuria.  Musculoskeletal: Negative for myalgias and back pain.  Skin: Negative for rash.  Neurological: Negative for seizures, facial asymmetry and weakness.  Hematological:  Does not bruise/bleed easily.      Allergies  Review of patient's allergies indicates no known allergies.  Home Medications   Prior to Admission medications   Medication Sig Start Date End Date Taking? Authorizing Provider  acetaminophen (TYLENOL) 500 MG tablet Take 500 mg by mouth every 6 (six) hours as needed for moderate pain.   Yes Historical Provider, MD  CARAFATE 1 GM/10ML suspension TAKE 10 MLS (1 G TOTAL) BY MOUTH 4 (FOUR) TIMES DAILY - WITH MEALS AND AT BEDTIME. 05/10/15  Yes Biagio Borg, MD  cyanocobalamin ([employer] VITAMIN B12) 1000 MCG tablet Take 1 tablet (1,000 mcg total) by mouth daily. 05/19/15  Yes Biagio Borg, MD  donepezil (ARICEPT) 5 MG tablet Take 1 tablet (5 mg total) by mouth at bedtime. 03/09/15  Yes Janece Canterbury, MD  feeding supplement, RESOURCE BREEZE, (RESOURCE BREEZE) LIQD Take 1 Container by mouth 3 (three) times daily between meals. 03/09/15  Yes Janece Canterbury, MD  isosorbide mononitrate (IMDUR) 30 MG 24 hr tablet Take 1 tablet (30 mg total) by mouth daily. 05/19/15  Yes Biagio Borg, MD  levothyroxine (SYNTHROID, LEVOTHROID) 88 MCG tablet TAKE 1 TABLET (88 MCG TOTAL) BY MOUTH DAILY BEFORE BREAKFAST. 03/25/15  Yes Biagio Borg, MD  lisinopril (PRINIVIL,ZESTRIL) 10 MG tablet TAKE 1 TABLET EVERY DAY Patient taking differently: TAKE 1 TABLET BY MOUTH EVERY DAY 03/25/15  Yes Biagio Borg, MD  loratadine (CLARITIN) 10 MG tablet Take 10 mg by mouth daily as needed for allergies.   Yes Historical Provider, MD  polyethylene glycol powder (MIRALAX) powder Take 17 g by mouth daily. Patient taking differently: Take 17 g by mouth 3 (three) times a week. Mon, Wed, Fri. 03/09/15  Yes Janece Canterbury, MD  polyvinyl alcohol (LIQUIFILM TEARS) 1.4 % ophthalmic solution Place 1 drop into both eyes 2 (two) times daily as needed for dry eyes.   Yes Historical Provider, MD  sertraline (ZOLOFT) 25 MG tablet TAKE 1 TABLET (25 MG TOTAL) BY MOUTH DAILY. 06/01/15  Yes Biagio Borg, MD  sodium  chloride (OCEAN) 0.65 % SOLN nasal spray Place 1 spray into both nostrils daily as needed for congestion.   Yes Historical Provider, MD  Calcium Carbonate-Vit D-Min (CALTRATE 600+D PLUS MINERALS) 600-800 MG-UNIT TABS Take 1 tablet by mouth daily.    Historical Provider, MD  nitroGLYCERIN (NITROSTAT) 0.4 MG SL tablet Place 1 tablet (0.4 mg total) under the tongue every 5 (five) minutes as needed. Chest pains 10/20/14   Biagio Borg, MD  traMADol (ULTRAM) 50 MG tablet Take 1 tablet (50 mg total) by mouth every 12 (twelve) hours as needed for severe pain. Patient not taking: Reported on 08/11/2015 06/19/15   Virgel Manifold, MD   BP 159/87 mmHg  Pulse 61  Temp(Src) 98 F (36.7 C) (Oral)  Resp 14  SpO2 99% Physical Exam  Constitutional: She is oriented to  person, place, and time. She appears well-developed and well-nourished. No distress.  thin  HENT:  Head: Normocephalic and atraumatic.  Right Ear: External ear normal.  Left Ear: External ear normal.  Nose: Nose normal.  Mouth/Throat: Oropharynx is clear and moist. No oropharyngeal exudate.  Eyes: EOM are normal.  Neck: Normal range of motion. Neck supple.  Cardiovascular: Normal rate, regular rhythm, normal heart sounds and intact distal pulses.   No murmur heard. Pulmonary/Chest: Effort normal. No respiratory distress. She has no wheezes. She has no rales.  Abdominal: Soft. She exhibits no distension. There is no tenderness.  Musculoskeletal: Normal range of motion. She exhibits no edema or tenderness.  Neurological: She is alert and oriented to person, place, and time. She has normal strength. No cranial nerve deficit or sensory deficit. She exhibits normal muscle tone. Coordination normal.  Skin: Skin is warm and dry. No rash noted. She is not diaphoretic.  Vitals reviewed.   ED Course  Procedures (including critical care time) Labs Review Labs Reviewed  CBC - Abnormal; Notable for the following:    Hemoglobin 11.2 (*)    HCT 34.4  (*)    RDW 15.7 (*)    All other components within normal limits  COMPREHENSIVE METABOLIC PANEL - Abnormal; Notable for the following:    Glucose, Bld 114 (*)    Creatinine, Ser 1.08 (*)    Albumin 3.3 (*)    ALT 13 (*)    GFR calc non Af Amer 42 (*)    GFR calc Af Amer 49 (*)    All other components within normal limits  LIPASE, BLOOD  I-STAT CG4 LACTIC ACID, ED  I-STAT TROPOININ, ED  I-STAT CG4 LACTIC ACID, ED    Imaging Review No results found. I have personally reviewed and evaluated these images and lab results as part of my medical decision-making.   EKG Interpretation   Date/Time:  Wednesday August 11 2015 15:47:21 EST Ventricular Rate:  71 PR Interval:  155 QRS Duration: 103 QT Interval:  454 QTC Calculation: 493 R Axis:   -40 Text Interpretation:  Sinus rhythm Left ventricular hypertrophy Borderline  T abnormalities, inferior leads Borderline prolonged QT interval Artifact  No significant change since last tracing Confirmed by Dondre Catalfamo  [phone]) on 08/11/2015 4:17:35 PM      MDM  Patient was seen and evaluated in stable condition.  Labs, EKG, chest xray unremarkable.  Patient awake, alert, talking requesting discharge.  Discussed all results with patient and her daughters at bedside.  Daughters said they felt their mother looked well and would like to take her home.  Discussed results and length as well as fact that it was unclear what exactly caused this episode although may be related to vomiting from Ensure.  Strict return precautions were given.  Daughters expressed understanding of return precautions and uncertainty around event that occurred and said they would take her to see her PCP for follow up.  Patient was discharged home in stable condition in the care of her daughters.  Final diagnoses:  Altered mental status, unspecified altered mental status type    1. Change in mental status  2. Episode of vomiting    Harvel Quale, MD 08/14/15  0100

## 2015-08-16 DIAGNOSIS — N19 Unspecified kidney failure: Secondary | ICD-10-CM | POA: Diagnosis not present

## 2015-08-17 DIAGNOSIS — N19 Unspecified kidney failure: Secondary | ICD-10-CM | POA: Diagnosis not present

## 2015-08-18 ENCOUNTER — Encounter: Payer: Self-pay | Admitting: Internal Medicine

## 2015-08-18 ENCOUNTER — Ambulatory Visit (INDEPENDENT_AMBULATORY_CARE_PROVIDER_SITE_OTHER): Payer: Commercial Managed Care - HMO | Admitting: Internal Medicine

## 2015-08-18 VITALS — BP 142/88 | HR 78 | Temp 99.0°F

## 2015-08-18 DIAGNOSIS — N19 Unspecified kidney failure: Secondary | ICD-10-CM | POA: Diagnosis not present

## 2015-08-18 DIAGNOSIS — F0390 Unspecified dementia without behavioral disturbance: Secondary | ICD-10-CM

## 2015-08-18 DIAGNOSIS — I10 Essential (primary) hypertension: Secondary | ICD-10-CM | POA: Diagnosis not present

## 2015-08-18 DIAGNOSIS — F29 Unspecified psychosis not due to a substance or known physiological condition: Secondary | ICD-10-CM

## 2015-08-18 NOTE — Assessment & Plan Note (Signed)
Declines seroquel,  to f/u any worsening symptoms or concerns

## 2015-08-18 NOTE — Progress Notes (Signed)
Subjective:    Patient ID: [name], female    DOB: 01-Jul-1917, 81 y.o.   MRN: [id number]  HPI  Here to f/u; overall doing ok,  Pt denies chest pain, increasing sob or doe, wheezing, orthopnea, PND, increased LE swelling, palpitations, dizziness or syncope.  Pt denies new neurological symptoms such as new headache, or facial or extremity weakness or numbness.  Pt denies polydipsia, polyuria, or low sugar episode.   Pt denies new neurological symptoms such as new headache, or facial or extremity weakness or numbness.   Pt states overall good compliance with meds, mostly trying to follow appropriate diet, but does not always follow her soft mech diet and had an episode of aspiration at last ED visit  Needs supplies at home in conjunction with [facility] and CAP program - chucks, wipe, pullups, and ensure  Dementia overall stable symptomatically with gradual worsening at best, but has had some assoc with behavioral changes such as hallucinations, paranoia, or agitation but duaghter with her states not severe enough to need seroquel. Past Medical History  Diagnosis Date  . Internal hemorrhoid 07/2012  . Diverticulosis of colon (without mention of hemorrhage) 07/2012  . Herpes zoster   . Glaucoma   . Lumbar back pain   . Hypertension   . Cardiomyopathy   . Hypothyroid   . Myocardial infarction ([place])   . History of blood transfusion     "related to diverticulitis" (12/02/2013)  . GERD (gastroesophageal reflux disease)   . Arthritis     "joints" (12/02/2013)  . Depression   . Anxiety   . Adenomatous colon polyp 07/2012    sessile cecal polyp.  no high grade dysplasia.   Marland Kitchen Hyponatremia 07/2012    Na as low as 114.   . Protein calorie malnutrition ([place]) 07/2012    BMI then: 16.5  . Zenker diverticulum 07/2012    this and esophageal dysmotility on esophagram.   . Psychosis   . Behavior disturbance    Past Surgical History  Procedure Laterality Date  . Thyroidectomy    . Appendectomy  2005      ruptured appendix  . Tubal ligation    . Colonoscopy  07/19/2012    Procedure: COLONOSCOPY;  Surgeon: Ladene Artist, MD,FACG;  Location: [facility];  Service: Endoscopy;  Laterality: N/A;  . Cataract extraction w/ intraocular lens  implant, bilateral Bilateral   . Glaucoma surgery Right     reports that she has never smoked. She has never used smokeless tobacco. She reports that she does not drink alcohol or use illicit drugs. family history includes Dementia in her sister; Heart attack in an other family member; Stroke in her daughter. There is no history of Colon cancer. No Known Allergies Current Outpatient Prescriptions on File Prior to Visit  Medication Sig Dispense Refill  . acetaminophen (TYLENOL) 500 MG tablet Take 500 mg by mouth every 6 (six) hours as needed for moderate pain.    . Calcium Carbonate-Vit D-Min (CALTRATE 600+D PLUS MINERALS) 600-800 MG-UNIT TABS Take 1 tablet by mouth daily.    Marland Kitchen CARAFATE 1 GM/10ML suspension TAKE 10 MLS (1 G TOTAL) BY MOUTH 4 (FOUR) TIMES DAILY - WITH MEALS AND AT BEDTIME. 420 mL 3  . cyanocobalamin ([employer] VITAMIN B12) 1000 MCG tablet Take 1 tablet (1,000 mcg total) by mouth daily. 30 tablet 11  . donepezil (ARICEPT) 5 MG tablet Take 1 tablet (5 mg total) by mouth at bedtime. 30 tablet 0  . feeding supplement, RESOURCE  BREEZE, (RESOURCE BREEZE) LIQD Take 1 Container by mouth 3 (three) times daily between meals. 90 Container 0  . isosorbide mononitrate (IMDUR) 30 MG 24 hr tablet Take 1 tablet (30 mg total) by mouth daily. 30 tablet 11  . levothyroxine (SYNTHROID, LEVOTHROID) 88 MCG tablet TAKE 1 TABLET (88 MCG TOTAL) BY MOUTH DAILY BEFORE BREAKFAST. 30 tablet 11  . lisinopril (PRINIVIL,ZESTRIL) 10 MG tablet TAKE 1 TABLET EVERY DAY (Patient taking differently: TAKE 1 TABLET BY MOUTH EVERY DAY) 30 tablet 11  . loratadine (CLARITIN) 10 MG tablet Take 10 mg by mouth daily as needed for allergies.    . nitroGLYCERIN (NITROSTAT) 0.4 MG SL tablet Place 1  tablet (0.4 mg total) under the tongue every 5 (five) minutes as needed. Chest pains 30 tablet 6  . polyethylene glycol powder (MIRALAX) powder Take 17 g by mouth daily. (Patient taking differently: Take 17 g by mouth 3 (three) times a week. Mon, Wed, Fri.) 850 g 0  . polyvinyl alcohol (LIQUIFILM TEARS) 1.4 % ophthalmic solution Place 1 drop into both eyes 2 (two) times daily as needed for dry eyes.    Marland Kitchen sertraline (ZOLOFT) 25 MG tablet TAKE 1 TABLET (25 MG TOTAL) BY MOUTH DAILY. 30 tablet 5  . sodium chloride (OCEAN) 0.65 % SOLN nasal spray Place 1 spray into both nostrils daily as needed for congestion.    . traMADol (ULTRAM) 50 MG tablet Take 1 tablet (50 mg total) by mouth every 12 (twelve) hours as needed for severe pain. (Patient not taking: Reported on 08/18/2015) 6 tablet 0  . [DISCONTINUED] isosorbide dinitrate (ISORDIL) 30 MG tablet Take 30 mg by mouth 3 (three) times daily.      . [DISCONTINUED] ranitidine (ZANTAC) 150 MG tablet Take 150 mg by mouth 2 (two) times daily.       No current facility-administered medications on file prior to visit.     Review of Systems Unable due to dementia    Objective:   Physical Exam BP 142/88 mmHg  Pulse 78  Temp(Src) 99 F (37.2 C) (Oral)  SpO2 98% VS noted, non toxic , alert, sitting in wheelchair Constitutional: Pt appears in no significant distress HENT: Head: NCAT.  Right Ear: External ear normal.  Left Ear: External ear normal.  Eyes: . Pupils are equal, round, and reactive to light. Conjunctivae and EOM are normal Neck: Normal range of motion. Neck supple.  Cardiovascular: Normal rate and regular rhythm.   Pulmonary/Chest: Effort normal and breath sounds without rales or wheezing.  Abd:  Soft, NT, ND, + BS Neurological: Pt is alert. + baseline confused , motor grossly intact Skin: Skin is warm. No rash, no LE edema Psychiatric: Pt behavior is normal. No agitation.        Assessment & Plan:

## 2015-08-18 NOTE — Progress Notes (Signed)
Pre visit review using our clinic review tool, if applicable. No additional management support is needed unless otherwise documented below in the visit note. 

## 2015-08-18 NOTE — Assessment & Plan Note (Signed)
stable overall by history and exam, recent data reviewed with pt, and pt to continue medical treatment as before,  to f/u any worsening symptoms or concerns Lab Results  Component Value Date   WBC 5.5 08/11/2015   HGB 11.2* 08/11/2015   HCT 34.4* 08/11/2015   PLT 172 08/11/2015   GLUCOSE 114* 08/11/2015   CHOL 258* 01/07/2008   TRIG 78 01/07/2008   HDL 98.8 01/07/2008   LDLDIRECT 140.0 01/07/2008   ALT 13* 08/11/2015   AST 22 08/11/2015   NA 142 08/11/2015   K 3.6 08/11/2015   CL 107 08/11/2015   CREATININE 1.08* 08/11/2015   BUN 20 08/11/2015   CO2 27 08/11/2015   TSH 1.145 05/11/2015   INR 1.13 07/02/2011   HGBA1C 5.7* 05/11/2015

## 2015-08-18 NOTE — Assessment & Plan Note (Signed)
stable overall by history and exam, recent data reviewed with pt, and pt to continue medical treatment as before,  to f/u any worsening symptoms or concerns BP Readings from Last 3 Encounters:  08/18/15 142/88  08/11/15 159/87  06/19/15 176/85

## 2015-08-18 NOTE — Patient Instructions (Signed)
Please continue all other medications as before, and refills have been done if requested.  Please have the pharmacy call with any other refills you may need.  Please keep your appointments with your specialists as you may have planned  Please return in 6 months, or sooner if needed 

## 2015-08-19 DIAGNOSIS — N19 Unspecified kidney failure: Secondary | ICD-10-CM | POA: Diagnosis not present

## 2015-08-20 DIAGNOSIS — N19 Unspecified kidney failure: Secondary | ICD-10-CM | POA: Diagnosis not present

## 2015-08-23 DIAGNOSIS — N19 Unspecified kidney failure: Secondary | ICD-10-CM | POA: Diagnosis not present

## 2015-08-24 DIAGNOSIS — N19 Unspecified kidney failure: Secondary | ICD-10-CM | POA: Diagnosis not present

## 2015-08-25 DIAGNOSIS — N19 Unspecified kidney failure: Secondary | ICD-10-CM | POA: Diagnosis not present

## 2015-08-26 DIAGNOSIS — N19 Unspecified kidney failure: Secondary | ICD-10-CM | POA: Diagnosis not present

## 2015-08-27 DIAGNOSIS — N19 Unspecified kidney failure: Secondary | ICD-10-CM | POA: Diagnosis not present

## 2015-08-30 DIAGNOSIS — N19 Unspecified kidney failure: Secondary | ICD-10-CM | POA: Diagnosis not present

## 2015-08-31 DIAGNOSIS — N19 Unspecified kidney failure: Secondary | ICD-10-CM | POA: Diagnosis not present

## 2015-09-01 DIAGNOSIS — N19 Unspecified kidney failure: Secondary | ICD-10-CM | POA: Diagnosis not present

## 2015-09-02 DIAGNOSIS — N19 Unspecified kidney failure: Secondary | ICD-10-CM | POA: Diagnosis not present

## 2015-09-03 DIAGNOSIS — N19 Unspecified kidney failure: Secondary | ICD-10-CM | POA: Diagnosis not present

## 2015-09-06 DIAGNOSIS — N19 Unspecified kidney failure: Secondary | ICD-10-CM | POA: Diagnosis not present

## 2015-09-07 DIAGNOSIS — N19 Unspecified kidney failure: Secondary | ICD-10-CM | POA: Diagnosis not present

## 2015-09-08 DIAGNOSIS — N19 Unspecified kidney failure: Secondary | ICD-10-CM | POA: Diagnosis not present

## 2015-09-09 DIAGNOSIS — N19 Unspecified kidney failure: Secondary | ICD-10-CM | POA: Diagnosis not present

## 2015-09-10 DIAGNOSIS — N19 Unspecified kidney failure: Secondary | ICD-10-CM | POA: Diagnosis not present

## 2015-09-13 DIAGNOSIS — N19 Unspecified kidney failure: Secondary | ICD-10-CM | POA: Diagnosis not present

## 2015-09-15 DIAGNOSIS — N19 Unspecified kidney failure: Secondary | ICD-10-CM | POA: Diagnosis not present

## 2015-09-16 DIAGNOSIS — N19 Unspecified kidney failure: Secondary | ICD-10-CM | POA: Diagnosis not present

## 2015-09-17 DIAGNOSIS — N19 Unspecified kidney failure: Secondary | ICD-10-CM | POA: Diagnosis not present

## 2015-09-20 DIAGNOSIS — N19 Unspecified kidney failure: Secondary | ICD-10-CM | POA: Diagnosis not present

## 2015-09-21 DIAGNOSIS — N19 Unspecified kidney failure: Secondary | ICD-10-CM | POA: Diagnosis not present

## 2015-09-22 DIAGNOSIS — N19 Unspecified kidney failure: Secondary | ICD-10-CM | POA: Diagnosis not present

## 2015-09-23 DIAGNOSIS — N19 Unspecified kidney failure: Secondary | ICD-10-CM | POA: Diagnosis not present

## 2015-09-24 DIAGNOSIS — N19 Unspecified kidney failure: Secondary | ICD-10-CM | POA: Diagnosis not present

## 2015-09-27 DIAGNOSIS — N19 Unspecified kidney failure: Secondary | ICD-10-CM | POA: Diagnosis not present

## 2015-09-28 DIAGNOSIS — N19 Unspecified kidney failure: Secondary | ICD-10-CM | POA: Diagnosis not present

## 2015-09-29 DIAGNOSIS — N19 Unspecified kidney failure: Secondary | ICD-10-CM | POA: Diagnosis not present

## 2015-09-30 DIAGNOSIS — N19 Unspecified kidney failure: Secondary | ICD-10-CM | POA: Diagnosis not present

## 2015-10-01 DIAGNOSIS — N19 Unspecified kidney failure: Secondary | ICD-10-CM | POA: Diagnosis not present

## 2015-10-02 ENCOUNTER — Other Ambulatory Visit: Payer: Self-pay | Admitting: Internal Medicine

## 2015-10-04 DIAGNOSIS — N19 Unspecified kidney failure: Secondary | ICD-10-CM | POA: Diagnosis not present

## 2015-10-04 NOTE — Telephone Encounter (Signed)
Patient has been seen frequently with acute visits, but not recently for wellness---are you ok with refilling without office visit--please advise, thanks

## 2015-10-05 DIAGNOSIS — N19 Unspecified kidney failure: Secondary | ICD-10-CM | POA: Diagnosis not present

## 2015-10-06 DIAGNOSIS — N19 Unspecified kidney failure: Secondary | ICD-10-CM | POA: Diagnosis not present

## 2015-10-07 DIAGNOSIS — N19 Unspecified kidney failure: Secondary | ICD-10-CM | POA: Diagnosis not present

## 2015-10-11 DIAGNOSIS — N19 Unspecified kidney failure: Secondary | ICD-10-CM | POA: Diagnosis not present

## 2015-10-12 DIAGNOSIS — N19 Unspecified kidney failure: Secondary | ICD-10-CM | POA: Diagnosis not present

## 2015-10-13 DIAGNOSIS — N19 Unspecified kidney failure: Secondary | ICD-10-CM | POA: Diagnosis not present

## 2015-10-14 DIAGNOSIS — N19 Unspecified kidney failure: Secondary | ICD-10-CM | POA: Diagnosis not present

## 2015-10-15 DIAGNOSIS — N19 Unspecified kidney failure: Secondary | ICD-10-CM | POA: Diagnosis not present

## 2015-10-18 DIAGNOSIS — N19 Unspecified kidney failure: Secondary | ICD-10-CM | POA: Diagnosis not present

## 2015-10-19 DIAGNOSIS — N19 Unspecified kidney failure: Secondary | ICD-10-CM | POA: Diagnosis not present

## 2015-10-20 DIAGNOSIS — N19 Unspecified kidney failure: Secondary | ICD-10-CM | POA: Diagnosis not present

## 2015-10-22 DIAGNOSIS — N19 Unspecified kidney failure: Secondary | ICD-10-CM | POA: Diagnosis not present

## 2015-10-23 ENCOUNTER — Observation Stay (HOSPITAL_COMMUNITY)
Admission: EM | Admit: 2015-10-23 | Discharge: 2015-10-25 | Disposition: A | Discharge status: Home / Self Care | Payer: Commercial Managed Care - HMO | Attending: Internal Medicine | Admitting: Internal Medicine

## 2015-10-23 ENCOUNTER — Emergency Department (HOSPITAL_COMMUNITY): Payer: Commercial Managed Care - HMO

## 2015-10-23 ENCOUNTER — Encounter (HOSPITAL_COMMUNITY): Payer: Self-pay | Admitting: Emergency Medicine

## 2015-10-23 DIAGNOSIS — E860 Dehydration: Secondary | ICD-10-CM | POA: Diagnosis present

## 2015-10-23 DIAGNOSIS — R55 Syncope and collapse: Secondary | ICD-10-CM | POA: Diagnosis not present

## 2015-10-23 DIAGNOSIS — M1990 Unspecified osteoarthritis, unspecified site: Secondary | ICD-10-CM | POA: Diagnosis not present

## 2015-10-23 DIAGNOSIS — R404 Transient alteration of awareness: Secondary | ICD-10-CM | POA: Diagnosis not present

## 2015-10-23 DIAGNOSIS — I429 Cardiomyopathy, unspecified: Secondary | ICD-10-CM | POA: Diagnosis not present

## 2015-10-23 DIAGNOSIS — D631 Anemia in chronic kidney disease: Secondary | ICD-10-CM

## 2015-10-23 DIAGNOSIS — I10 Essential (primary) hypertension: Secondary | ICD-10-CM | POA: Diagnosis not present

## 2015-10-23 DIAGNOSIS — E43 Unspecified severe protein-calorie malnutrition: Secondary | ICD-10-CM | POA: Insufficient documentation

## 2015-10-23 DIAGNOSIS — Z681 Body mass index (BMI) 19 or less, adult: Secondary | ICD-10-CM | POA: Diagnosis not present

## 2015-10-23 DIAGNOSIS — D649 Anemia, unspecified: Secondary | ICD-10-CM | POA: Insufficient documentation

## 2015-10-23 DIAGNOSIS — F0391 Unspecified dementia with behavioral disturbance: Secondary | ICD-10-CM | POA: Diagnosis not present

## 2015-10-23 DIAGNOSIS — K219 Gastro-esophageal reflux disease without esophagitis: Secondary | ICD-10-CM | POA: Insufficient documentation

## 2015-10-23 DIAGNOSIS — F03918 Unspecified dementia, unspecified severity, with other behavioral disturbance: Secondary | ICD-10-CM | POA: Diagnosis present

## 2015-10-23 DIAGNOSIS — I252 Old myocardial infarction: Secondary | ICD-10-CM | POA: Diagnosis not present

## 2015-10-23 DIAGNOSIS — Z79899 Other long term (current) drug therapy: Secondary | ICD-10-CM | POA: Diagnosis not present

## 2015-10-23 DIAGNOSIS — N189 Chronic kidney disease, unspecified: Secondary | ICD-10-CM

## 2015-10-23 DIAGNOSIS — N289 Disorder of kidney and ureter, unspecified: Secondary | ICD-10-CM

## 2015-10-23 DIAGNOSIS — N183 Chronic kidney disease, stage 3 (moderate): Secondary | ICD-10-CM | POA: Insufficient documentation

## 2015-10-23 DIAGNOSIS — E890 Postprocedural hypothyroidism: Secondary | ICD-10-CM

## 2015-10-23 DIAGNOSIS — D62 Acute posthemorrhagic anemia: Secondary | ICD-10-CM | POA: Diagnosis present

## 2015-10-23 DIAGNOSIS — E039 Hypothyroidism, unspecified: Secondary | ICD-10-CM | POA: Diagnosis present

## 2015-10-23 DIAGNOSIS — I129 Hypertensive chronic kidney disease with stage 1 through stage 4 chronic kidney disease, or unspecified chronic kidney disease: Secondary | ICD-10-CM | POA: Diagnosis not present

## 2015-10-23 HISTORY — DX: Diverticulitis of intestine, part unspecified, without perforation or abscess without bleeding: K57.92

## 2015-10-23 LAB — CBC
HEMATOCRIT: 32.6 % — AB (ref 36.0–46.0)
Hemoglobin: 10.3 g/dL — ABNORMAL LOW (ref 12.0–15.0)
MCH: 27.3 pg (ref 26.0–34.0)
MCHC: 31.6 g/dL (ref 30.0–36.0)
MCV: 86.5 fL (ref 78.0–100.0)
Platelets: 223 10*3/uL (ref 150–400)
RBC: 3.77 MIL/uL — AB (ref 3.87–5.11)
RDW: 15 % (ref 11.5–15.5)
WBC: 6.7 10*3/uL (ref 4.0–10.5)

## 2015-10-23 LAB — BASIC METABOLIC PANEL
ANION GAP: 10 (ref 5–15)
BUN: 23 mg/dL — ABNORMAL HIGH (ref 6–20)
CO2: 24 mmol/L (ref 22–32)
Calcium: 8.7 mg/dL — ABNORMAL LOW (ref 8.9–10.3)
Chloride: 106 mmol/L (ref 101–111)
Creatinine, Ser: 0.93 mg/dL (ref 0.44–1.00)
GFR, EST AFRICAN AMERICAN: 58 mL/min — AB (ref >60)
GFR, EST NON AFRICAN AMERICAN: 50 mL/min — AB (ref >60)
GLUCOSE: 132 mg/dL — AB (ref 65–99)
POTASSIUM: 3.8 mmol/L (ref 3.5–5.1)
Sodium: 140 mmol/L (ref 135–145)

## 2015-10-23 LAB — MAGNESIUM: MAGNESIUM: 1.3 mg/dL — AB (ref 1.7–2.4)

## 2015-10-23 LAB — I-STAT TROPONIN, ED: Troponin i, poc: 0.01 ng/mL (ref 0.00–0.08)

## 2015-10-23 MED ORDER — ACETAMINOPHEN 500 MG PO TABS: 500.0000 mg | ORAL_TABLET | Freq: Four times a day (QID) | ORAL | Status: DC | PRN

## 2015-10-23 MED ORDER — SODIUM CHLORIDE 0.9 % IV BOLUS (SEPSIS)
1000.0000 mL | Freq: Once | INTRAVENOUS | Status: AC
  Administered 2015-10-23: 1000 mL via INTRAVENOUS

## 2015-10-23 MED ORDER — BOOST / RESOURCE BREEZE PO LIQD
1.0000 | Freq: Three times a day (TID) | ORAL | Status: DC
  Administered 2015-10-23 – 2015-10-24 (×2): 1 via ORAL

## 2015-10-23 MED ORDER — SERTRALINE HCL 25 MG PO TABS
25.0000 mg | ORAL_TABLET | Freq: Every day | ORAL | Status: DC
  Administered 2015-10-24 – 2015-10-25 (×2): 25 mg via ORAL
  Filled 2015-10-23 (×2): qty 1

## 2015-10-23 MED ORDER — LISINOPRIL 10 MG PO TABS
10.0000 mg | ORAL_TABLET | Freq: Every day | ORAL | Status: DC
  Administered 2015-10-24 – 2015-10-25 (×2): 10 mg via ORAL
  Filled 2015-10-23 (×2): qty 1

## 2015-10-23 MED ORDER — SUCRALFATE 1 GM/10ML PO SUSP
1.0000 g | Freq: Four times a day (QID) | ORAL | Status: DC
  Administered 2015-10-23 – 2015-10-25 (×7): 1 g via ORAL
  Filled 2015-10-23 (×7): qty 10

## 2015-10-23 MED ORDER — ISOSORBIDE MONONITRATE ER 30 MG PO TB24
30.0000 mg | ORAL_TABLET | Freq: Every day | ORAL | Status: DC
  Administered 2015-10-24 – 2015-10-25 (×2): 30 mg via ORAL
  Filled 2015-10-23 (×2): qty 1

## 2015-10-23 MED ORDER — LEVOTHYROXINE SODIUM 88 MCG PO TABS
88.0000 ug | ORAL_TABLET | Freq: Every day | ORAL | Status: DC
  Administered 2015-10-24 – 2015-10-25 (×2): 88 ug via ORAL
  Filled 2015-10-23 (×2): qty 1

## 2015-10-23 MED ORDER — DONEPEZIL HCL 5 MG PO TABS
5.0000 mg | ORAL_TABLET | Freq: Every day | ORAL | Status: DC
  Administered 2015-10-23 – 2015-10-24 (×2): 5 mg via ORAL
  Filled 2015-10-23 (×2): qty 1

## 2015-10-23 MED ORDER — SODIUM CHLORIDE 0.9% FLUSH
3.0000 mL | Freq: Two times a day (BID) | INTRAVENOUS | Status: DC
  Administered 2015-10-23 – 2015-10-25 (×4): 3 mL via INTRAVENOUS

## 2015-10-23 NOTE — ED Notes (Signed)
Pt transported to xray 

## 2015-10-23 NOTE — ED Notes (Signed)
Pt presents via EMS from home after a witnessed near-syncopal episode with scant foaming of the mouth and nose, no fall and no injury.  She is currently alert and oriented x 3, disoriented to time, denies pain.  EMS reports orthstatic positive en route, BP falling from 0000000 systolic while sitting to 90 systolic while standing.  No additional complaints or concerns.  Vitals otherwise within normal limits.

## 2015-10-23 NOTE — ED Notes (Signed)
Bed: WA06 Expected date: 10/23/15 Expected time: 5:04 PM Means of arrival: Ambulance Comments: Syncopal- orthostatic

## 2015-10-23 NOTE — ED Provider Notes (Signed)
CSN: [id number]     Arrival date & time 10/23/15  1706 History   First MD Initiated Contact with Patient 10/23/15 1711     Chief Complaint  Patient presents with  . Near Syncope     (Consider location/radiation/quality/duration/timing/severity/associated sxs/prior Treatment) HPI Comments: Patient presents emergency department with chief complaint of syncopal episode. Patient had a witnessed syncopal episode while on the toilet today. She did not fall. She stays at home with her daughter. She does not remember passing out. She denies any pain or injuries. Reportedly orthostatic by EMS. Patient denies any headache, chest pain, shortness breath, or abdominal pain. She has not taken anything for her symptoms. There are no aggravating or alleviating factors.  The history is provided by the patient. No language interpreter was used.    Past Medical History  Diagnosis Date  . Internal hemorrhoid 07/2012  . Diverticulosis of colon (without mention of hemorrhage) 07/2012  . Herpes zoster   . Glaucoma   . Lumbar back pain   . Hypertension   . Cardiomyopathy   . Hypothyroid   . Myocardial infarction ([place])   . History of blood transfusion     "related to diverticulitis" (12/02/2013)  . GERD (gastroesophageal reflux disease)   . Arthritis     "joints" (12/02/2013)  . Depression   . Anxiety   . Adenomatous colon polyp 07/2012    sessile cecal polyp.  no high grade dysplasia.   Marland Kitchen Hyponatremia 07/2012    Na as low as 114.   . Protein calorie malnutrition ([place]) 07/2012    BMI then: 16.5  . Zenker diverticulum 07/2012    this and esophageal dysmotility on esophagram.   . Psychosis   . Behavior disturbance    Past Surgical History  Procedure Laterality Date  . Thyroidectomy    . Appendectomy  2005    ruptured appendix  . Tubal ligation    . Colonoscopy  07/19/2012    Procedure: COLONOSCOPY;  Surgeon: Ladene Artist, MD,FACG;  Location: [facility];  Service: Endoscopy;  Laterality: N/A;   . Cataract extraction w/ intraocular lens  implant, bilateral Bilateral   . Glaucoma surgery Right    Family History  Problem Relation Age of Onset  . Colon cancer Neg Hx   . Stroke Daughter   . Heart attack    . Dementia Sister    Social History  Substance Use Topics  . Smoking status: Never Smoker   . Smokeless tobacco: Never Used  . Alcohol Use: No   OB History    No data available     Review of Systems  Constitutional: Negative for fever and chills.  Respiratory: Negative for shortness of breath.   Cardiovascular: Negative for chest pain.  Gastrointestinal: Negative for nausea, vomiting, diarrhea and constipation.  Genitourinary: Negative for dysuria.  Neurological: Positive for syncope.  All other systems reviewed and are negative.     Allergies  Review of patient's allergies indicates no known allergies.  Home Medications   Prior to Admission medications   Medication Sig Start Date End Date Taking? Authorizing Provider  acetaminophen (TYLENOL) 500 MG tablet Take 500 mg by mouth every 6 (six) hours as needed for moderate pain.    Historical Provider, MD  Calcium Carbonate-Vit D-Min (CALTRATE 600+D PLUS MINERALS) 600-800 MG-UNIT TABS Take 1 tablet by mouth daily.    Historical Provider, MD  CARAFATE 1 GM/10ML suspension TAKE 10 MLS (1 G TOTAL) BY MOUTH 4 (FOUR) TIMES DAILY - WITH MEALS  AND AT BEDTIME. 10/04/15   Biagio Borg, MD  cyanocobalamin ([employer] VITAMIN B12) 1000 MCG tablet Take 1 tablet (1,000 mcg total) by mouth daily. 05/19/15   Biagio Borg, MD  donepezil (ARICEPT) 5 MG tablet Take 1 tablet (5 mg total) by mouth at bedtime. 03/09/15   Janece Canterbury, MD  feeding supplement, RESOURCE BREEZE, (RESOURCE BREEZE) LIQD Take 1 Container by mouth 3 (three) times daily between meals. 03/09/15   Janece Canterbury, MD  isosorbide mononitrate (IMDUR) 30 MG 24 hr tablet Take 1 tablet (30 mg total) by mouth daily. 05/19/15   Biagio Borg, MD  levothyroxine (SYNTHROID,  LEVOTHROID) 88 MCG tablet TAKE 1 TABLET (88 MCG TOTAL) BY MOUTH DAILY BEFORE BREAKFAST. 03/25/15   Biagio Borg, MD  lisinopril (PRINIVIL,ZESTRIL) 10 MG tablet TAKE 1 TABLET EVERY DAY Patient taking differently: TAKE 1 TABLET BY MOUTH EVERY DAY 03/25/15   Biagio Borg, MD  loratadine (CLARITIN) 10 MG tablet Take 10 mg by mouth daily as needed for allergies.    Historical Provider, MD  nitroGLYCERIN (NITROSTAT) 0.4 MG SL tablet Place 1 tablet (0.4 mg total) under the tongue every 5 (five) minutes as needed. Chest pains 10/20/14   Biagio Borg, MD  polyethylene glycol powder [facility]) powder Take 17 g by mouth daily. Patient taking differently: Take 17 g by mouth 3 (three) times a week. Mon, Wed, Fri. 03/09/15   Janece Canterbury, MD  polyvinyl alcohol (LIQUIFILM TEARS) 1.4 % ophthalmic solution Place 1 drop into both eyes 2 (two) times daily as needed for dry eyes.    Historical Provider, MD  sertraline (ZOLOFT) 25 MG tablet TAKE 1 TABLET (25 MG TOTAL) BY MOUTH DAILY. 06/01/15   Biagio Borg, MD  sodium chloride (OCEAN) 0.65 % SOLN nasal spray Place 1 spray into both nostrils daily as needed for congestion.    Historical Provider, MD  traMADol (ULTRAM) 50 MG tablet Take 1 tablet (50 mg total) by mouth every 12 (twelve) hours as needed for severe pain. Patient not taking: Reported on 08/18/2015 06/19/15   Virgel Manifold, MD   There were no vitals taken for this visit. Physical Exam  Constitutional: She is oriented to person, place, and time. She appears well-developed and well-nourished.  frail  HENT:  Head: Normocephalic and atraumatic.  Eyes: Conjunctivae and EOM are normal. Pupils are equal, round, and reactive to light.  Neck: Normal range of motion. Neck supple.  Cardiovascular: Normal rate and regular rhythm.  Exam reveals no gallop and no friction rub.   No murmur heard. Pulmonary/Chest: Effort normal and breath sounds normal. No respiratory distress. She has no wheezes. She has no rales. She  exhibits no tenderness.  Abdominal: Soft. Bowel sounds are normal. She exhibits no distension and no mass. There is no tenderness. There is no rebound and no guarding.  Musculoskeletal: Normal range of motion. She exhibits no edema or tenderness.  Neurological: She is alert and oriented to person, place, and time.  Skin: Skin is warm and dry.  Psychiatric: She has a normal mood and affect. Her behavior is normal. Judgment and thought content normal.  Nursing note and vitals reviewed.   ED Course  Procedures (including critical care time) Results for orders placed or performed during the hospital encounter of 10/23/15  CBC  Result Value Ref Range   WBC 6.7 4.0 - 10.5 K/uL   RBC 3.77 (L) 3.87 - 5.11 MIL/uL   Hemoglobin 10.3 (L) 12.0 - 15.0 g/dL  HCT 32.6 (L) 36.0 - 46.0 %   MCV 86.5 78.0 - 100.0 fL   MCH 27.3 26.0 - 34.0 pg   MCHC 31.6 30.0 - 36.0 g/dL   RDW 15.0 11.5 - 15.5 %   Platelets 223 150 - 400 K/uL  Basic metabolic panel  Result Value Ref Range   Sodium 140 135 - 145 mmol/L   Potassium 3.8 3.5 - 5.1 mmol/L   Chloride 106 101 - 111 mmol/L   CO2 24 22 - 32 mmol/L   Glucose, Bld 132 (H) 65 - 99 mg/dL   BUN 23 (H) 6 - 20 mg/dL   Creatinine, Ser 0.93 0.44 - 1.00 mg/dL   Calcium 8.7 (L) 8.9 - 10.3 mg/dL   GFR calc non Af Amer 50 (L) >60 mL/min   GFR calc Af Amer 58 (L) >60 mL/min   Anion gap 10 5 - 15  I-Stat Troponin, ED (not at [facility])  Result Value Ref Range   Troponin i, poc 0.01 0.00 - 0.08 ng/mL   Comment 3           Dg Chest 2 View  10/23/2015  CLINICAL DATA:  Syncope EXAM: CHEST  2 VIEW COMPARISON:  08/01/2015 FINDINGS: Heart size is enlarged. Uncoiled aorta. Negative for heart failure. Lungs are clear without infiltrate or effusion. No change from the prior study. IMPRESSION: No active cardiopulmonary disease. Electronically Signed   By: Franchot Gallo M.D.   On: 10/23/2015 17:58    I have personally reviewed and evaluated these images and lab results as part of my  medical decision-making.   EKG Interpretation   Date/Time:  Saturday October 23 2015 17:30:09 EST Ventricular Rate:  70 PR Interval:  145 QRS Duration: 98 QT Interval:  405 QTC Calculation: 437 R Axis:   -40 Text Interpretation:  Sinus or ectopic atrial rhythm Left axis deviation  Borderline T abnormalities, diffuse leads Confirmed by Alvino Chapel  MD,  Ovid Curd [phone]) on 10/23/2015 5:34:19 PM      MDM   Final diagnoses:  Syncope, unspecified syncope type    Patient with syncopal episode today. Last approximately 30 seconds. She did not fall. Orthostatic per EMS. Will check labs.  Troponin is negative. Hemoglobin is 10.3. No electrolyte abnormalities. Chest x-ray is negative. No evidence of ischemia on EKG. Repeat orthostatic vital signs are normal after liter of fluid. Patient discussed with Dr. Alvino Chapel, who agrees with plan for hospital admission for observation.    Montine Circle, PA-C 10/23/15 1933  Davonna Belling, MD 10/24/15 [phone]

## 2015-10-23 NOTE — H&P (Signed)
History and Physical  Patient Name: [name]     [id number]    DOB: 06/07/1917    DOA: 10/23/2015 Referring physician: Lorre Munroe, PA-C PCP: Cathlean Cower, MD      Chief Complaint: Syncope  HPI: [name] is a 82 y.o. female with a past medical history significant for hypothyroidism, HTN, advanced dementia, CKD stage III and chronic anemia who presents with syncopal episode.  The patient has advanced dementia; she lives with her daughter, has a CNA that watches her during the day, can make her needs known but otherwise not engage in much conversation, and needs assistance with all ADLs. She walks with a walker at baseline.  She was in her usual state of health until this evening after her daughter got home from work and she has to go to the bathroom. While she was sitting on the commode she steadily became unresponsive, stiffened, urinated and had a bowel movement, and then slumped over. There was no tonic-clonic movement. This was similar to previous episodes she has had in the past while on the commode, one time in the context of very severe diverticular hemorrhage.  When EMS arrived, he reported the patient was orthostatic with a systolic pressure that dropped from 160-90 with standing.  In the ED, the patient was afebrile, heart rate 73, BP 157/82 without orthostasis.  K3.8, HCO3 24, Cr 0.9, BG 132. WBC 6.7, IMA globin 10.3, troponin negative. An ECG showed sinus rhythm, with PR axis -54 (similar to previous).  The patient was administered fluids and TRH were asked to evaluate for admission.     Review of Systems:  Unable to obtain from patient due to patient mentation, although she denies headache, chest pain, dyspnea, fever, pain anywhere. Reports she has good appetite.  No Known Allergies  Prior to Admission medications   Medication Sig Start Date End Date Taking? Authorizing Provider  acetaminophen (TYLENOL) 500 MG tablet Take 500 mg by mouth every 6 (six) hours as  needed for moderate pain.   Yes Historical Provider, MD  Calcium Carbonate-Vit D-Min (CALTRATE 600+D PLUS MINERALS) 600-800 MG-UNIT TABS Take 1 tablet by mouth daily.   Yes Historical Provider, MD  CARAFATE 1 GM/10ML suspension TAKE 10 MLS (1 G TOTAL) BY MOUTH 4 (FOUR) TIMES DAILY - WITH MEALS AND AT BEDTIME. 10/04/15  Yes Biagio Borg, MD  cyanocobalamin ([employer] VITAMIN B12) 1000 MCG tablet Take 1 tablet (1,000 mcg total) by mouth daily. 05/19/15  Yes Biagio Borg, MD  donepezil (ARICEPT) 5 MG tablet Take 1 tablet (5 mg total) by mouth at bedtime. 03/09/15  Yes Janece Canterbury, MD  feeding supplement, RESOURCE BREEZE, (RESOURCE BREEZE) LIQD Take 1 Container by mouth 3 (three) times daily between meals. 03/09/15  Yes Janece Canterbury, MD  isosorbide mononitrate (IMDUR) 30 MG 24 hr tablet Take 1 tablet (30 mg total) by mouth daily. 05/19/15  Yes Biagio Borg, MD  levothyroxine (SYNTHROID, LEVOTHROID) 88 MCG tablet TAKE 1 TABLET (88 MCG TOTAL) BY MOUTH DAILY BEFORE BREAKFAST. 03/25/15  Yes Biagio Borg, MD  lisinopril (PRINIVIL,ZESTRIL) 10 MG tablet TAKE 1 TABLET EVERY DAY Patient taking differently: TAKE 1 TABLET BY MOUTH EVERY DAY 03/25/15  Yes Biagio Borg, MD  polyethylene glycol powder (MIRALAX) powder Take 17 g by mouth daily. Patient taking differently: Take 17 g by mouth 3 (three) times a week. Mon, Wed, Fri. 03/09/15  Yes Janece Canterbury, MD  polyvinyl alcohol (LIQUIFILM TEARS) 1.4 % ophthalmic solution Place 1 drop into  both eyes 2 (two) times daily as needed for dry eyes.   Yes Historical Provider, MD  sertraline (ZOLOFT) 25 MG tablet TAKE 1 TABLET (25 MG TOTAL) BY MOUTH DAILY. 06/01/15  Yes Biagio Borg, MD  loratadine (CLARITIN) 10 MG tablet Take 10 mg by mouth daily as needed for allergies.    Historical Provider, MD  nitroGLYCERIN (NITROSTAT) 0.4 MG SL tablet Place 1 tablet (0.4 mg total) under the tongue every 5 (five) minutes as needed. Chest pains 10/20/14   Biagio Borg, MD  sodium chloride (OCEAN)  0.65 % SOLN nasal spray Place 1 spray into both nostrils daily as needed for congestion.    Historical Provider, MD  traMADol (ULTRAM) 50 MG tablet Take 1 tablet (50 mg total) by mouth every 12 (twelve) hours as needed for severe pain. Patient not taking: Reported on 08/18/2015 06/19/15   Virgel Manifold, MD    Past Medical History  Diagnosis Date  . Internal hemorrhoid 07/2012  . Diverticulosis of colon (without mention of hemorrhage) 07/2012  . Herpes zoster   . Glaucoma   . Lumbar back pain   . Hypertension   . Cardiomyopathy   . Hypothyroid   . Myocardial infarction ([place])   . History of blood transfusion     "related to diverticulitis" (12/02/2013)  . GERD (gastroesophageal reflux disease)   . Arthritis     "joints" (12/02/2013)  . Depression   . Anxiety   . Adenomatous colon polyp 07/2012    sessile cecal polyp.  no high grade dysplasia.   Marland Kitchen Hyponatremia 07/2012    Na as low as 114.   . Protein calorie malnutrition ([place]) 07/2012    BMI then: 16.5  . Zenker diverticulum 07/2012    this and esophageal dysmotility on esophagram.   . Psychosis   . Behavior disturbance   . Diverticulitis     Past Surgical History  Procedure Laterality Date  . Thyroidectomy    . Appendectomy  2005    ruptured appendix  . Tubal ligation    . Colonoscopy  07/19/2012    Procedure: COLONOSCOPY;  Surgeon: Ladene Artist, MD,FACG;  Location: [facility];  Service: Endoscopy;  Laterality: N/A;  . Cataract extraction w/ intraocular lens  implant, bilateral Bilateral   . Glaucoma surgery Right     Family history: family history includes Dementia in her sister; Stroke in her daughter. There is no history of Colon cancer.  Social History: Patient lives with her daughter. She makes her needs known and baseline but she does not have conversations. Requires assistance with all ADLs. Uses a walker at baseline. Nonsmoker.       Physical Exam: BP 157/83 mmHg  Pulse 73  Temp(Src) 97.6 F (36.4 C)  (Oral)  Resp 23  Ht 5\' 1"  (1.549 m)  Wt 41.731 kg (92 lb)  BMI 17.39 kg/m2  SpO2 97% General appearance: Frail elderly female, awake but not alert to conversation and in no acute distress or pain.   Eyes: Anicteric, senile blue around iris, lids and lashes normal.     ENT: No nasal deformity, discharge, or epistaxis.  OP tacky without lesions.   Lymph: No cervical or supraclavicular lymphadenopathy. Skin: Warm and dry.   Cardiac: RRR, nl S1-S2, no murmurs appreciated. Heart sounds distant Capillary refill is brisk.  No JVD.  No LE edema.  Radial pulses 2+ and symmetric. Respiratory: Normal respiratory rate and rhythm.  CTAB without rales or wheezes. Basilar atelectasis. Abdomen: Abdomen soft without  rigidity.  No TTP orguarding. No ascites, distension.   Neuro: Cranial nerves 3, 4, 6, intact. Face symmetric.  Tongue midline. Hearing poor. Palate elevation symmetric. Exam limited by cooperation, dementia. Answers simple questions. States her daughter is "Curator".  Speech is fluent.  Moves all extremities equally and with normal coordination, but globally very debilitated.   Attention diminished. Memory at baseline perdaughter. Psych: Affect flat.  No evidence of aural or visual hallucinations or delusions.       Labs on Admission:  The metabolic panel shows normal sodium, potassium, bicarbonate, and renal function. Elevated BUN to creatinine ratio. Normal glycemic. Troponin normal. The complete blood count shows no leukocytosis or thrombocytopenia. Chronic stable normocytic anemia.   Radiological Exams on Admission: Personally reviewed: Dg Chest 2 View 10/23/2015  No focal airspace disease.   EKG: Independently reviewed. Rate 70s, sinus rhythm. PR axis -54. QTC 437. Diffuse T-wave flattening. Unchanged from previous.  Echocardiogram 2015: EF 45-50%, moderate mitral regurgitation.    Assessment/Plan 1. Syncope, suspect vasovagal episode:  Differential includes vasovagal  syncope as preferred dx, also orthostasis, hemorrhage/diverticular bleed, arrhythmia.  Seizure doubted.  Intracranial pathology also doubted, given no history of head trauma, history of identical episodes in the past over years, and normal neurological exam for age/dementia, for this reason I will defer neuraxial imaging for now. -I recommend telemetry observation overnight -Monitor output and for GI bleed   2. Dehydration:  This is new.  Elevated BUN-creatinine ratio. Suspect also given exam. Other cause of BUN/Cr elevation is GIB. -Fluid given in ER -Strict I/Os -Encourage oral hydration  3. Anemia, chronic, suspect GI bloodloss:  -Check ferritin (last Aug 31, not on iron)  4. Hypothyroidism, post-surgical:  -Continue home levothyroxine  5. HTN:  Slightly hypertensive at admission -Continue lisinopril, Imdur  6. Advanced dementia:  -Continue donepezil (doubt efficacy), sertraline and mirtazapine  7. Severe PCM: -Continue home nutritional supplements   DVT PPx: SCDs Diet: Regular Consultants: Noe Code Status: DO NOT RESUSCITATE Family Communication: Daughter, present atbedside. Differential discussed.  Plan forobservation and discharge if no GIB or arrhythmia tomorrow discussed, and family with no questions,appear to agree to plan.  Medical decision making: What exists of the patient's previous chart was reviewed in depth and the case was discussed with Rob Browning,PA-C. Patient seen 8:02 PM on 10/23/2015.  Disposition Plan:  I recommend admission to telemetry obs status.  Clinical condition: stable.  Anticipate observation overnight and discharge tomorrow if no hemorrhage or arrhtyhmia. Daughter works Architectural technologist.      Edwin Dada [facility] Pager [phone]

## 2015-10-24 DIAGNOSIS — E039 Hypothyroidism, unspecified: Secondary | ICD-10-CM

## 2015-10-24 DIAGNOSIS — I10 Essential (primary) hypertension: Secondary | ICD-10-CM | POA: Diagnosis not present

## 2015-10-24 DIAGNOSIS — R55 Syncope and collapse: Secondary | ICD-10-CM | POA: Diagnosis not present

## 2015-10-24 DIAGNOSIS — D62 Acute posthemorrhagic anemia: Secondary | ICD-10-CM

## 2015-10-24 DIAGNOSIS — F0391 Unspecified dementia with behavioral disturbance: Secondary | ICD-10-CM | POA: Diagnosis not present

## 2015-10-24 DIAGNOSIS — E860 Dehydration: Secondary | ICD-10-CM | POA: Diagnosis not present

## 2015-10-24 DIAGNOSIS — F0390 Unspecified dementia without behavioral disturbance: Secondary | ICD-10-CM

## 2015-10-24 LAB — GLUCOSE, CAPILLARY: Glucose-Capillary: 84 mg/dL (ref 65–99)

## 2015-10-24 LAB — BASIC METABOLIC PANEL
ANION GAP: 7 (ref 5–15)
BUN: 22 mg/dL — ABNORMAL HIGH (ref 6–20)
CALCIUM: 8.7 mg/dL — AB (ref 8.9–10.3)
CO2: 27 mmol/L (ref 22–32)
Chloride: 107 mmol/L (ref 101–111)
Creatinine, Ser: 0.83 mg/dL (ref 0.44–1.00)
GFR, EST NON AFRICAN AMERICAN: 58 mL/min — AB (ref >60)
Glucose, Bld: 95 mg/dL (ref 65–99)
POTASSIUM: 3.5 mmol/L (ref 3.5–5.1)
SODIUM: 141 mmol/L (ref 135–145)

## 2015-10-24 LAB — CBC
HCT: 30.4 % — ABNORMAL LOW (ref 36.0–46.0)
Hemoglobin: 9.7 g/dL — ABNORMAL LOW (ref 12.0–15.0)
MCH: 27.3 pg (ref 26.0–34.0)
MCHC: 31.9 g/dL (ref 30.0–36.0)
MCV: 85.6 fL (ref 78.0–100.0)
PLATELETS: 226 10*3/uL (ref 150–400)
RBC: 3.55 MIL/uL — AB (ref 3.87–5.11)
RDW: 14.8 % (ref 11.5–15.5)
WBC: 5.2 10*3/uL (ref 4.0–10.5)

## 2015-10-24 LAB — FERRITIN: FERRITIN: 56 ng/mL (ref 11–307)

## 2015-10-24 NOTE — Progress Notes (Addendum)
Pt scheduled for morning CBG, pt seemed concerned and would not let us draw a morning CBG. Attempted again later, pt still hesitant and said no. Morning labs showed glucose at 95.

## 2015-10-24 NOTE — Progress Notes (Signed)
[facility] Progress Note  Patient: [name] [id number]   PCP: Cathlean Cower, MD DOB: 08/05/1917   DOA: 10/23/2015   DOS: 10/24/2015   Date of Service: the patient was seen and examined on 10/24/2015  Subjective: Patient denies any acute complaints no chest pain or shortness of breath, Nutrition: Tolerating soft diet Activity: Mostly bedridden Last BM: 10/23/2015  Assessment and Plan: 1. Syncope Patient presented with an episode while she was stiff, leaning to her side and passed out while she was on commode. No diarrhea no nausea no vomiting reported. She was orthostatic initially. Currently had H&H, orthostatic vital stables. No events on telemetry reported. We will continue to monitor, recheck orthostatic, PTOT consultation.  2. Anemia, suspected acute blood loss. No active bleeding reported here in the hospital. H&H is mildly trending downwards will continue to closely monitor. Mechanical compression.  3. Hypothyroidism. Continue Synthroid.  4. Essential hypertension  Continuing home medication  6. Dementia Continuing home medications. No pedal disturbances noted.  7. Protein calorie malnutrition. Continuing nutritional supplement.  DVT Prophylaxis: mechanical compression device Nutrition: Mechanical soft diet Advance goals of care discussion: DNR/DNI  Brief Summary of Hospitalization:  HPI: As per the H and P dictated on admission, "Britiney Collen is a 82 y.o. female with a past medical history significant for hypothyroidism, HTN, advanced dementia, CKD stage III and chronic anemia who presents with syncopal episode.  The patient has advanced dementia; she lives with her daughter, has a CNA that watches her during the day, can make her needs known but otherwise not engage in much conversation, and needs assistance with all ADLs. She walks with a walker at baseline.  She was in her usual state of health until this evening after her daughter got home from work  and she has to go to the bathroom. While she was sitting on the commode she steadily became unresponsive, stiffened, urinated and had a bowel movement, and then slumped over. There was no tonic-clonic movement. This was similar to previous episodes she has had in the past while on the commode, one time in the context of very severe diverticular hemorrhage. When EMS arrived, he reported the patient was orthostatic with a systolic pressure that dropped from 160-90 with standing.  In the ED, the patient was afebrile, heart rate 73, BP 157/82 without orthostasis. K3.8, HCO3 24, Cr 0.9, BG 132. WBC 6.7, IMA globin 10.3, troponin negative. An ECG showed sinus rhythm, with PR axis -54 (similar to previous). The patient was administered fluids and TRH were asked to evaluate for admission."  Daily update, Procedures: Admitted on 10/23/2015 Consultants: None Antibiotics: Anti-infectives    None      Family Communication: no family was present at bedside, at the time of interview.   Disposition:  Expected discharge date: 10/25/2015  Barriers to safe discharge: Hb stabilization    Intake/Output Summary (Last 24 hours) at 10/24/15 1621 Last data filed at 10/24/15 1500  Gross per 24 hour  Intake    300 ml  Output      0 ml  Net    300 ml   Filed Weights   10/23/15 1721 10/23/15 2134 10/24/15 0608  Weight: 41.731 kg (92 lb) 41.2 kg (90 lb 13.3 oz) 432 kg (952 lb 6.2 oz)    Objective: Physical Exam: Filed Vitals:   10/23/15 2134 10/24/15 0000 10/24/15 0608 10/24/15 1430  BP: 182/85 156/80 158/86 143/80  Pulse: 72 74 70 83  Temp: 97.6 F (36.4 C) 98.1 F (36.7  C) 97.7 F (36.5 C)   TempSrc: Oral Oral Axillary   Resp: 18 19 16 18   Height: 5\' 1"  (1.549 m)     Weight: 41.2 kg (90 lb 13.3 oz)  432 kg (952 lb 6.2 oz)   SpO2: 100% 100% 99% 98%     General: Appear in no distress, no Rash; Oral Mucosa moist. Cardiovascular: S1 and S2 Present, no Murmur, no JVD Respiratory: Bilateral Air  entry present and Clear to Auscultation, no Crackles, no wheezes Abdomen: Bowel Sound present, Soft and no tenderness Extremities: no Pedal edema, no calf tenderness Neurology: Grossly no focal neuro deficit.  Data Reviewed: CBC:  Recent Labs Lab 10/23/15 1730 10/24/15 0540  WBC 6.7 5.2  HGB 10.3* 9.7*  HCT 32.6* 30.4*  MCV 86.5 85.6  PLT 223 [id number]   Basic Metabolic Panel:  Recent Labs Lab 10/23/15 1730 10/23/15 1744 10/24/15 0540  NA 140  --  141  K 3.8  --  3.5  CL 106  --  107  CO2 24  --  27  GLUCOSE 132*  --  95  BUN 23*  --  22*  CREATININE 0.93  --  0.83  CALCIUM 8.7*  --  8.7*  MG  --  1.3*  --    Liver Function Tests: No results for input(s): AST, ALT, ALKPHOS, BILITOT, PROT, ALBUMIN in the last 168 hours. No results for input(s): LIPASE, AMYLASE in the last 168 hours. No results for input(s): AMMONIA in the last 168 hours.  Cardiac Enzymes: No results for input(s): CKTOTAL, CKMB, CKMBINDEX, TROPONINI in the last 168 hours.  BNP (last 3 results) No results for input(s): BNP in the last 8760 hours.  CBG:  Recent Labs Lab 10/24/15 0805  GLUCAP 84    No results found for this or any previous visit (from the past 240 hour(s)).   Studies: Dg Chest 2 View  10/23/2015  CLINICAL DATA:  Syncope EXAM: CHEST  2 VIEW COMPARISON:  08/01/2015 FINDINGS: Heart size is enlarged. Uncoiled aorta. Negative for heart failure. Lungs are clear without infiltrate or effusion. No change from the prior study. IMPRESSION: No active cardiopulmonary disease. Electronically Signed   By: Franchot Gallo M.D.   On: 10/23/2015 17:58     Scheduled Meds: . donepezil  5 mg Oral QHS  . feeding supplement  1 Container Oral TID BM  . isosorbide mononitrate  30 mg Oral Daily  . levothyroxine  88 mcg Oral QAC breakfast  . lisinopril  10 mg Oral Daily  . sertraline  25 mg Oral Daily  . sodium chloride flush  3 mL Intravenous Q12H  . sucralfate  1 g Oral 4 times per day   Continuous  Infusions:  PRN Meds: acetaminophen  Time spent: 30 minutes  Author: Berle Mull, MD [facility] Pager: [phone] 10/24/2015 4:21 PM  If 7PM-7AM, please contact night-coverage at [email or web address], password [facility]

## 2015-10-25 DIAGNOSIS — N289 Disorder of kidney and ureter, unspecified: Secondary | ICD-10-CM

## 2015-10-25 DIAGNOSIS — I10 Essential (primary) hypertension: Secondary | ICD-10-CM | POA: Diagnosis not present

## 2015-10-25 DIAGNOSIS — R55 Syncope and collapse: Secondary | ICD-10-CM | POA: Diagnosis not present

## 2015-10-25 DIAGNOSIS — E860 Dehydration: Secondary | ICD-10-CM | POA: Diagnosis not present

## 2015-10-25 DIAGNOSIS — D62 Acute posthemorrhagic anemia: Secondary | ICD-10-CM | POA: Diagnosis not present

## 2015-10-25 DIAGNOSIS — F0391 Unspecified dementia with behavioral disturbance: Secondary | ICD-10-CM | POA: Diagnosis not present

## 2015-10-25 LAB — CBC WITH DIFFERENTIAL/PLATELET
BASOS ABS: 0 10*3/uL (ref 0.0–0.1)
BASOS PCT: 0 %
Eosinophils Absolute: 0.1 10*3/uL (ref 0.0–0.7)
Eosinophils Relative: 2 %
HEMATOCRIT: 33.3 % — AB (ref 36.0–46.0)
HEMOGLOBIN: 10.4 g/dL — AB (ref 12.0–15.0)
LYMPHS PCT: 26 %
Lymphs Abs: 1.6 10*3/uL (ref 0.7–4.0)
MCH: 27.4 pg (ref 26.0–34.0)
MCHC: 31.2 g/dL (ref 30.0–36.0)
MCV: 87.6 fL (ref 78.0–100.0)
MONO ABS: 0.4 10*3/uL (ref 0.1–1.0)
Monocytes Relative: 7 %
NEUTROS ABS: 4 10*3/uL (ref 1.7–7.7)
NEUTROS PCT: 65 %
Platelets: 254 10*3/uL (ref 150–400)
RBC: 3.8 MIL/uL — ABNORMAL LOW (ref 3.87–5.11)
RDW: 15.1 % (ref 11.5–15.5)
WBC: 6.1 10*3/uL (ref 4.0–10.5)

## 2015-10-25 LAB — GLUCOSE, CAPILLARY: Glucose-Capillary: 113 mg/dL — ABNORMAL HIGH (ref 65–99)

## 2015-10-25 MED ORDER — ENSURE ENLIVE PO LIQD: 237.0000 mL | Freq: Three times a day (TID) | ORAL | Status: DC

## 2015-10-25 MED ORDER — ENSURE ENLIVE PO LIQD
237.0000 mL | Freq: Three times a day (TID) | ORAL | Status: DC
  Administered 2015-10-25: 237 mL via ORAL

## 2015-10-25 NOTE — Progress Notes (Signed)
CSW reviewed PT evaluation recommending SNF vs. Return home with 24hr assist. CSW confirmed with patient's daughter, Charlett Nose at bedside that she plans to take her back home with 24 hour care. RNCM, Cookie aware.   No further CSW needs identified - CSW signing off.   Raynaldo Opitz, [facility] Clinical Social Worker cell #: [phone]

## 2015-10-25 NOTE — Evaluation (Signed)
Physical Therapy Evaluation Patient Details Name: [name] MRN: [id number] DOB: 1917-06-13 Today's Date: 10/25/2015   History of Present Illness  [name] is a 82 y.o. female with a past medical history significant for hypothyroidism, HTN, advanced dementia, CKD stage III and chronic anemia who presents with syncopal episode.  Clinical Impression  Patient evaluated by Physical Therapy with no further acute PT needs identified. All education has been completed and the patient has no further questions.  See below for any follow-up Physical Therapy or equipment needs. PT is signing off. Thank you for this referral. Recommend PT eval at SNF (if does not D/C home); pt is total assist to transition to EOB, she is unable to follow commands and mildly agitated when movement initiated;  No family present     Follow Up Recommendations SNF vs return home with 24hr assist    Equipment Recommendations  None recommended by PT    Recommendations for Other Services       Precautions / Restrictions        Mobility  Bed Mobility Overal bed mobility: Needs Assistance;+2 for physical assistance Bed Mobility: Supine to Sit;Sit to Supine     Supine to sit: Total assist Sit to supine: Total assist   General bed mobility comments: +2 to scoot up in bed; +1 total assist to transition from supine<>sit  Transfers                    Ambulation/Gait                Stairs            Wheelchair Mobility    Modified Rankin (Stroke Patients Only)       Balance Overall balance assessment: Needs assistance   Sitting balance-Leahy Scale: Zero                                       Pertinent Vitals/Pain Pain Assessment: Faces Faces Pain Scale: Hurts even more Pain Location: pt unable to state Pain Descriptors / Indicators:  (screams with imposed movement) Pain Intervention(s): Limited activity within patient's tolerance    Home Living  Family/patient expects to be discharged to:: Private residence Living Arrangements: Children Available Help at Discharge: Family;Personal care attendant;Available 24 hours/day Type of Home: House       Home Layout: One level Home Equipment: [place] - 2 wheels;Bedside commode Additional Comments: No family present, but per chart states Daughter is with pt at night and has a CNA staying with her during the day; UNSURE OF BASELINE MOBILITY, CHART STATES ION ONE PLACE THAT SHE AMB WITH RW, ANOTHER STATES MOSTLY  bedridden    Prior Function                 Hand Dominance        Extremity/Trunk Assessment   Upper Extremity Assessment: Defer to OT evaluation           Lower Extremity Assessment: RLE deficits/detail;LLE deficits/detail;Difficult to assess due to impaired cognition RLE Deficits / Details: resistant to imposed movement LLE Deficits / Details: resistant to imposed movement     Communication      Cognition Arousal/Alertness:  (sleepy but arouses easily) Behavior During Therapy: Agitated Overall Cognitive Status: Impaired/Different from baseline                      General Comments  Exercises        Assessment/Plan    PT Assessment All further PT needs can be met in the next venue of care  PT Diagnosis Generalized weakness;Altered mental status   PT Problem List    PT Treatment Interventions     PT Goals (Current goals can be found in the Care Plan section) Acute Rehab PT Goals Patient Stated Goal: unable to state PT Goal Formulation: Patient unable to participate in goal setting    Frequency     Barriers to discharge        Co-evaluation               End of Session   Activity Tolerance: Treatment limited secondary to agitation Patient left: in bed;with call bell/phone within reach;with bed alarm set      Functional Assessment Tool Used: clinical judgement Functional Limitation: Changing and maintaining body  position Changing and Maintaining Body Position Current Status (Y6503): 100 percent impaired, limited or restricted Changing and Maintaining Body Position Goal Status (T4656): 100 percent impaired, limited or restricted Changing and Maintaining Body Position Discharge Status (C1275): 100 percent impaired, limited or restricted    Time: 1700-1749 PT Time Calculation (min) (ACUTE ONLY): 21 min   Charges:   PT Evaluation $PT Eval Moderate Complexity: 1 Procedure     PT G Codes:   PT G-Codes **NOT FOR INPATIENT CLASS** Functional Assessment Tool Used: clinical judgement Functional Limitation: Changing and maintaining body position Changing and Maintaining Body Position Current Status (S4967): 100 percent impaired, limited or restricted Changing and Maintaining Body Position Goal Status (R9163): 100 percent impaired, limited or restricted Changing and Maintaining Body Position Discharge Status (W4665): 100 percent impaired, limited or restricted    [facility] 10/25/2015, 12:19 PM

## 2015-10-25 NOTE — Progress Notes (Addendum)
Initial Nutrition Assessment  DOCUMENTATION CODES:   Severe malnutrition in context of chronic illness, Underweight  INTERVENTION:  - Will d/c Boost Breeze - Will order Ensure Enlive TID, each supplement provides 350 kcal and 20 grams of protein - Will change diet from Regular to Soft - Techs/RNs to provide feeding assistance during meals - RD will continue to monitor for needs  NUTRITION DIAGNOSIS:   Malnutrition related to chronic illness as evidenced by severe depletion of muscle mass, moderate depletion of body fat.  GOAL:   Patient will meet greater than or equal to 90% of their needs  MONITOR:   PO intake, Supplement acceptance, Weight trends, Labs, Skin, I & O's  REASON FOR ASSESSMENT:   Other (Comment) (Underweight BMI)  ASSESSMENT:   82 y.o. female with a past medical history significant for hypothyroidism, HTN, advanced dementia, CKD stage III and chronic anemia who presents with syncopal episode.  Pt seen for underweight BMI. Per chart review, pt ate 15% of breakfast this AM. Pt with hx of dementia and unable to provide information. No family/visitors present at bedside. Called pt's daughter, [name], whose number is on Facesheet, and she provided all information. PTA pt had a variable appetite with some days being better than others and some meals being better than others; pt has never eaten large portions at meals. Pt needs soft foods and is unable to chew tough meats or raw fruits and vegetables. Daughter states that a CNA stays with pt during the day and daughter has encouraged these individuals to ensure that pt is sitting straight up during times of PO intake. PTA pt was provided with Ensure TID to supplement times of poor intake.   Physical assessment indicates severe muscle wasting and moderate fat wasting. Per chart review, pt's weight has been stable (88-92 lbs) x1 year.   Pt likely meeting needs PTA with supplementation and based on stable weight x1 year.  Medications reviewed. Labs reviewed; BUN: 22 mg/dL, Ca: 8.7 mg/dL.  ADDENDUM: Daughter reports pt ate ~75% of dinner last night which consisted of fish, broccoli, and sweet potato casserole. She states pt is able to feed herself but usually eats minimal PO during this time. Family or CNA will then help pt by feeding her more of meal and pt typically does well with this. Daughter provided RD with lunch order; RD placed order for lunch to arrive ~1230 as daughter states she will be visiting pt sometime between 1200 and 1300.    Diet Order:  DIET SOFT Room service appropriate?: Yes; Fluid consistency:: Thin  Skin:  Reviewed, no issues  Last BM:  2/4  Height:   Ht Readings from Last 1 Encounters:  10/23/15 5\' 1"  (1.549 m)    Weight:   Wt Readings from Last 1 Encounters:  10/25/15 91 lb 0.8 oz (41.3 kg)    Ideal Body Weight:  47.73 kg (kg)  BMI:  Body mass index is 17.21 kg/(m^2).  Estimated Nutritional Needs:   Kcal:  1030-1230  Protein:  40-50 grams  Fluid:  >/=1.5 L   EDUCATION NEEDS:   No education needs identified at this time     [name], RD, LDN Inpatient Clinical Dietitian Pager # [phone] After hours/weekend pager # [phone]

## 2015-10-26 NOTE — Discharge Summary (Addendum)
[facility] Discharge Summary   Patient: [name] [id number]   PCP: Cathlean Cower, MD DOB: Oct 03, 1916   Date of admission: 10/23/2015   Date of discharge: 10/25/2015    Discharge Diagnoses:  Principal Problem:   Syncope Active Problems:   Hypothyroidism   Essential hypertension   Renal insufficiency, mild   Anemia associated with acute blood loss   Dementia with behavioral disturbance   Dehydration  Recommendations for Outpatient Follow-up:  1. Follow-up with PCP in one week   Follow-up Information    Schedule an appointment as soon as possible for a visit with Cathlean Cower, MD.   Specialties:  Internal Medicine, Radiology   Why:  1-2 weeks   Contact information:   [place] [address] [phone]       Diet recommendation: Regular diet  Activity: The patient is advised to gradually reintroduce usual activities.  Discharge Condition: good  History of present illness: As per the H and P dictated on admission, "[name] is a 82 y.o. female with a past medical history significant for hypothyroidism, HTN, advanced dementia, CKD stage III and chronic anemia who presents with syncopal episode.  The patient has advanced dementia; she lives with her daughter, has a CNA that watches her during the day, can make her needs known but otherwise not engage in much conversation, and needs assistance with all ADLs. She walks with a walker at baseline.  She was in her usual state of health until this evening after her daughter got home from work and she has to go to the bathroom. While she was sitting on the commode she steadily became unresponsive, stiffened, urinated and had a bowel movement, and then slumped over. There was no tonic-clonic movement. This was similar to previous episodes she has had in the past while on the commode, one time in the context of very severe diverticular hemorrhage. When EMS arrived, he reported the patient was orthostatic  with a systolic pressure that dropped from 160-90 with standing.  In the ED, the patient was afebrile, heart rate 73, BP 157/82 without orthostasis. K3.8, HCO3 24, Cr 0.9, BG 132. WBC 6.7, IMA globin 10.3, troponin negative. An ECG showed sinus rhythm, with PR axis -54 (similar to previous). The patient was administered fluids and TRH were asked to evaluate for admission."  Hospital Course:  Summary of her active problems in the hospital is as following. 1. Syncope, Likely vasovagal associated with defecation. Patient presented with an episode while she was stiff, leaning to her side and passed out while she was on commode. No diarrhea no nausea no vomiting reported. She was orthostatic initially. Currently her H&H, orthostatic vital stables. No events on telemetry reported. Patient remained hemodynamically stable and did not have any further episodes of syncope in the hospital.  2. Anemia, suspected acute blood loss. No active bleeding reported here in the hospital. H&H remained stable.  3. Hypothyroidism. Continue Synthroid.  4. Essential hypertension  Continuing home medication  6. Dementia Continuing home medications. No behavioral disturbances noted.  7. Protein calorie malnutrition. Continuing nutritional supplement.  All other chronic medical condition were stable during the hospitalization.  Patient was seen by physical therapy, who recommended 24-hour supervision versus SNF, patient's daughter decided to take the patient to home since she has 24-hour CNA at home. On the day of the discharge the patient's vitals remained stable, and no other acute medical condition were reported by patient. the patient was felt safe to be  discharge at home with 24-hour supervision.  Procedures and Results:  none   Consultations:  none  DISCHARGE MEDICATION: Discharge Medication List as of 10/25/2015  1:11 PM    CONTINUE these medications which have CHANGED   Details  feeding  supplement, ENSURE ENLIVE, (ENSURE ENLIVE) LIQD Take 237 mLs by mouth 3 (three) times daily between meals., Starting 10/25/2015, Until Discontinued, Normal      CONTINUE these medications which have NOT CHANGED   Details  acetaminophen (TYLENOL) 500 MG tablet Take 500 mg by mouth every 6 (six) hours as needed for moderate pain., Until Discontinued, Historical Med    Calcium Carbonate-Vit D-Min (CALTRATE 600+D PLUS MINERALS) 600-800 MG-UNIT TABS Take 1 tablet by mouth daily., Until Discontinued, Historical Med    CARAFATE 1 GM/10ML suspension TAKE 10 MLS (1 G TOTAL) BY MOUTH 4 (FOUR) TIMES DAILY - WITH MEALS AND AT BEDTIME., Normal    cyanocobalamin ([employer] VITAMIN B12) 1000 MCG tablet Take 1 tablet (1,000 mcg total) by mouth daily., Starting 05/19/2015, Until Discontinued, Normal    donepezil (ARICEPT) 5 MG tablet Take 1 tablet (5 mg total) by mouth at bedtime., Starting 03/09/2015, Until Discontinued, Normal    isosorbide mononitrate (IMDUR) 30 MG 24 hr tablet Take 1 tablet (30 mg total) by mouth daily., Starting 05/19/2015, Until Discontinued, Normal    levothyroxine (SYNTHROID, LEVOTHROID) 88 MCG tablet TAKE 1 TABLET (88 MCG TOTAL) BY MOUTH DAILY BEFORE BREAKFAST., Normal    lisinopril (PRINIVIL,ZESTRIL) 10 MG tablet TAKE 1 TABLET EVERY DAY, Normal    polyethylene glycol powder (MIRALAX) powder Take 17 g by mouth daily., Starting 03/09/2015, Until Discontinued, Normal    polyvinyl alcohol (LIQUIFILM TEARS) 1.4 % ophthalmic solution Place 1 drop into both eyes 2 (two) times daily as needed for dry eyes., Until Discontinued, Historical Med    sertraline (ZOLOFT) 25 MG tablet TAKE 1 TABLET (25 MG TOTAL) BY MOUTH DAILY., Normal    loratadine (CLARITIN) 10 MG tablet Take 10 mg by mouth daily as needed for allergies., Until Discontinued, Historical Med    nitroGLYCERIN (NITROSTAT) 0.4 MG SL tablet Place 1 tablet (0.4 mg total) under the tongue every 5 (five) minutes as needed. Chest pains, Starting  10/20/2014, Until Discontinued, Normal    sodium chloride (OCEAN) 0.65 % SOLN nasal spray Place 1 spray into both nostrils daily as needed for congestion., Until Discontinued, Historical Med    traMADol (ULTRAM) 50 MG tablet Take 1 tablet (50 mg total) by mouth every 12 (twelve) hours as needed for severe pain., Starting 06/19/2015, Until Discontinued, Print       No Known Allergies Discharge Instructions    Diet - low sodium heart healthy    Complete by:  As directed      Increase activity slowly    Complete by:  As directed           Discharge Exam: Filed Weights   10/23/15 2134 10/24/15 0608 10/25/15 0516  Weight: 41.2 kg (90 lb 13.3 oz) 432 kg (952 lb 6.2 oz) 41.3 kg (91 lb 0.8 oz)   Filed Vitals:   10/24/15 2238 10/25/15 0516  BP: 157/75 158/95  Pulse: 73 75  Temp: 98.1 F (36.7 C) 98.1 F (36.7 C)  Resp: 18 18   General: Appear in no distress, no Rash; Oral Mucosa moist. Cardiovascular: S1 and S2 Present, no Murmur, no JVD Respiratory: Bilateral Air entry present and Clear to Auscultation, no Crackles, no wheezes Abdomen: Bowel Sound present, Soft and no tenderness Extremities: no  Pedal edema, no calf tenderness Neurology: Grossly no focal neuro deficit.  The results of significant diagnostics from this hospitalization (including imaging, microbiology, ancillary and laboratory) are listed below for reference.    Significant Diagnostic Studies: Dg Chest 2 View  10/23/2015  CLINICAL DATA:  Syncope EXAM: CHEST  2 VIEW COMPARISON:  08/01/2015 FINDINGS: Heart size is enlarged. Uncoiled aorta. Negative for heart failure. Lungs are clear without infiltrate or effusion. No change from the prior study. IMPRESSION: No active cardiopulmonary disease. Electronically Signed   By: Franchot Gallo M.D.   On: 10/23/2015 17:58    Microbiology: No results found for this or any previous visit (from the past 240 hour(s)).   Labs: CBC:  Recent Labs Lab 10/23/15 1730 10/24/15 0540  10/25/15 0600  WBC 6.7 5.2 6.1  NEUTROABS  --   --  4.0  HGB 10.3* 9.7* 10.4*  HCT 32.6* 30.4* 33.3*  MCV 86.5 85.6 87.6  PLT 223 226 0000000   Basic Metabolic Panel:  Recent Labs Lab 10/23/15 1730 10/23/15 1744 10/24/15 0540  NA 140  --  141  K 3.8  --  3.5  CL 106  --  107  CO2 24  --  27  GLUCOSE 132*  --  95  BUN 23*  --  22*  CREATININE 0.93  --  0.83  CALCIUM 8.7*  --  8.7*  MG  --  1.3*  --    CBG:  Recent Labs Lab 10/24/15 0805 10/25/15 0733  GLUCAP 84 113*   Time spent: 30 minutes  Signed:  Markice Torbert  [facility] 10/25/2015, 4:00 PM

## 2015-10-27 ENCOUNTER — Telehealth: Payer: Self-pay | Admitting: *Deleted

## 2015-10-27 DIAGNOSIS — N19 Unspecified kidney failure: Secondary | ICD-10-CM | POA: Diagnosis not present

## 2015-10-27 NOTE — Telephone Encounter (Signed)
Transition Care Management Follow-up Telephone Call  Date discharged? 10/25/15  How have you been since you were released from the hospital? Spoke with pt daughter [name]) she states mom is doing alright   Do you understand why you were in the hospital? YES   Do you understand the discharge instructions? YES   Where were you discharged to? Home   Items Reviewed:  Medications reviewed: NO, daughter was at work, but she stated their was no changes to mom meds  Allergies reviewed: NO  Dietary changes reviewed: NO  Referrals reviewed: No referral recommended   Functional Questionnaire:   Activities of Daily Living (ADLs):   She states she are independent in the following: feeding, continence, toileting and dressing States she require assistance with the following: ambulation and bathing and hygiene   Any transportation issues/concerns?: NO   Any patient concerns? NO   Confirmed importance and date/time of follow-up visits scheduled YES, appt 11/03/15  Provider Appointment booked with  Confirmed with patient if condition begins to worsen call PCP or go to the ER.  Patient was given the office number and encouraged to call back with question or concerns.  : YES

## 2015-10-28 DIAGNOSIS — N19 Unspecified kidney failure: Secondary | ICD-10-CM | POA: Diagnosis not present

## 2015-10-29 DIAGNOSIS — N19 Unspecified kidney failure: Secondary | ICD-10-CM | POA: Diagnosis not present

## 2015-11-01 DIAGNOSIS — N19 Unspecified kidney failure: Secondary | ICD-10-CM | POA: Diagnosis not present

## 2015-11-02 DIAGNOSIS — N19 Unspecified kidney failure: Secondary | ICD-10-CM | POA: Diagnosis not present

## 2015-11-03 ENCOUNTER — Encounter (HOSPITAL_COMMUNITY): Payer: Self-pay | Admitting: Emergency Medicine

## 2015-11-03 ENCOUNTER — Encounter: Payer: Self-pay | Admitting: Internal Medicine

## 2015-11-03 ENCOUNTER — Inpatient Hospital Stay (HOSPITAL_COMMUNITY)
Admission: EM | Admit: 2015-11-03 | Discharge: 2015-11-08 | DRG: 379 | Disposition: A | Discharge status: Home / Self Care | Payer: Commercial Managed Care - HMO | Attending: Internal Medicine | Admitting: Internal Medicine

## 2015-11-03 ENCOUNTER — Ambulatory Visit (INDEPENDENT_AMBULATORY_CARE_PROVIDER_SITE_OTHER): Payer: Commercial Managed Care - HMO | Admitting: Internal Medicine

## 2015-11-03 VITALS — BP 100/76 | HR 77 | Temp 98.4°F | Resp 20 | Wt 91.0 lb

## 2015-11-03 DIAGNOSIS — I10 Essential (primary) hypertension: Secondary | ICD-10-CM | POA: Diagnosis not present

## 2015-11-03 DIAGNOSIS — R55 Syncope and collapse: Secondary | ICD-10-CM | POA: Diagnosis not present

## 2015-11-03 DIAGNOSIS — Z8249 Family history of ischemic heart disease and other diseases of the circulatory system: Secondary | ICD-10-CM

## 2015-11-03 DIAGNOSIS — K625 Hemorrhage of anus and rectum: Secondary | ICD-10-CM | POA: Diagnosis not present

## 2015-11-03 DIAGNOSIS — Z79899 Other long term (current) drug therapy: Secondary | ICD-10-CM

## 2015-11-03 DIAGNOSIS — F0391 Unspecified dementia with behavioral disturbance: Secondary | ICD-10-CM

## 2015-11-03 DIAGNOSIS — K922 Gastrointestinal hemorrhage, unspecified: Secondary | ICD-10-CM | POA: Diagnosis present

## 2015-11-03 DIAGNOSIS — E039 Hypothyroidism, unspecified: Secondary | ICD-10-CM | POA: Diagnosis present

## 2015-11-03 DIAGNOSIS — E876 Hypokalemia: Secondary | ICD-10-CM | POA: Diagnosis not present

## 2015-11-03 DIAGNOSIS — H409 Unspecified glaucoma: Secondary | ICD-10-CM | POA: Diagnosis not present

## 2015-11-03 DIAGNOSIS — I252 Old myocardial infarction: Secondary | ICD-10-CM

## 2015-11-03 DIAGNOSIS — Z66 Do not resuscitate: Secondary | ICD-10-CM | POA: Diagnosis not present

## 2015-11-03 DIAGNOSIS — K5791 Diverticulosis of intestine, part unspecified, without perforation or abscess with bleeding: Secondary | ICD-10-CM | POA: Diagnosis not present

## 2015-11-03 DIAGNOSIS — I429 Cardiomyopathy, unspecified: Secondary | ICD-10-CM | POA: Diagnosis not present

## 2015-11-03 DIAGNOSIS — N19 Unspecified kidney failure: Secondary | ICD-10-CM | POA: Diagnosis not present

## 2015-11-03 HISTORY — DX: Malignant neoplasm of colon, unspecified: C18.9

## 2015-11-03 LAB — CBC
HCT: 32.4 % — ABNORMAL LOW (ref 36.0–46.0)
HEMOGLOBIN: 10.2 g/dL — AB (ref 12.0–15.0)
MCH: 27.7 pg (ref 26.0–34.0)
MCHC: 31.5 g/dL (ref 30.0–36.0)
MCV: 88 fL (ref 78.0–100.0)
Platelets: 261 10*3/uL (ref 150–400)
RBC: 3.68 MIL/uL — AB (ref 3.87–5.11)
RDW: 15.4 % (ref 11.5–15.5)
WBC: 6.2 10*3/uL (ref 4.0–10.5)

## 2015-11-03 LAB — BASIC METABOLIC PANEL
ANION GAP: 10 (ref 5–15)
BUN: 20 mg/dL (ref 6–20)
CHLORIDE: 103 mmol/L (ref 101–111)
CO2: 28 mmol/L (ref 22–32)
Calcium: 9.2 mg/dL (ref 8.9–10.3)
Creatinine, Ser: 1.07 mg/dL — ABNORMAL HIGH (ref 0.44–1.00)
GFR calc non Af Amer: 42 mL/min — ABNORMAL LOW (ref >60)
GFR, EST AFRICAN AMERICAN: 49 mL/min — AB (ref >60)
GLUCOSE: 129 mg/dL — AB (ref 65–99)
POTASSIUM: 4.2 mmol/L (ref 3.5–5.1)
Sodium: 141 mmol/L (ref 135–145)

## 2015-11-03 MED ORDER — NYSTATIN 100000 UNIT/ML MT SUSP: 500000.0000 [IU] | Freq: Four times a day (QID) | OROMUCOSAL | Status: DC

## 2015-11-03 NOTE — Progress Notes (Signed)
Pre visit review using our clinic review tool, if applicable. No additional management support is needed unless otherwise documented below in the visit note. 

## 2015-11-03 NOTE — H&P (Signed)
[facility]                                                                                    Patient Demographics  [name], is a 82 y.o. female  CSN: [id number]  MRN: [id number]  DOB - Mar 23, 1917  Admit Date - 11/03/2015  Outpatient Primary MD for the patient is Cathlean Cower, MD   With History of -  Past Medical History  Diagnosis Date  . Internal hemorrhoid 07/2012  . Diverticulosis of colon (without mention of hemorrhage) 07/2012  . Herpes zoster   . Glaucoma   . Lumbar back pain   . Hypertension   . Cardiomyopathy   . Hypothyroid   . Myocardial infarction ([place])   . History of blood transfusion     "related to diverticulitis" (12/02/2013)  . GERD (gastroesophageal reflux disease)   . Arthritis     "joints" (12/02/2013)  . Depression   . Anxiety   . Adenomatous colon polyp 07/2012    sessile cecal polyp.  no high grade dysplasia.   Marland Kitchen Hyponatremia 07/2012    Na as low as 114.   . Protein calorie malnutrition ([place]) 07/2012    BMI then: 16.5  . Zenker diverticulum 07/2012    this and esophageal dysmotility on esophagram.   . Psychosis   . Behavior disturbance   . Diverticulitis       Past Surgical History  Procedure Laterality Date  . Thyroidectomy    . Appendectomy  2005    ruptured appendix  . Tubal ligation    . Colonoscopy  07/19/2012    Procedure: COLONOSCOPY;  Surgeon: Ladene Artist, MD,FACG;  Location: [facility] ENDOSCOPY;  Service: Endoscopy;  Laterality: N/A;  . Cataract extraction w/ intraocular lens  implant, bilateral Bilateral   . Glaucoma surgery Right     in for   Chief Complaint  Patient presents with  . Rectal Bleeding     HPI  [name]  is a 82 y.o. female, with history of diverticulosis who was brought by her daughter today after she noticed maroon color stools in the bathroom . She reports that her mother complained of lower abdominal pain followed by loose bowel movement. Patient is tired and cannot give any  significant history and most  of the history was taken from her daughter at bedside. Her daughter lives with her and helps take care of her in the same apartment . No history of fevers, chills, nausea, vomiting or loss of consciousness. Patient had 2 episodes of GI bleed in 2012 and 2014 and had a previous scope with the diagnosis of diverticulosis in the past.    Review of Systems    In addition to the HPI above,  No Fever-chills, No Headache, No changes with Vision or hearing, No problems swallowing food or Liquids, No Chest pain, Cough or Shortness of Breath, No dysuria, No new skin rashes or bruises, No new joints pains-aches,  No new weakness, tingling, numbness in any extremity, No recent weight gain or loss, No polyuria, polydypsia or polyphagia, No significant Mental Stressors.  A full 10 point Review of Systems was done, except as stated  above, all other Review of Systems were negative.   Social History Social History  Substance Use Topics  . Smoking status: Never Smoker   . Smokeless tobacco: Never Used  . Alcohol Use: No     Family History Family History  Problem Relation Age of Onset  . Colon cancer Neg Hx   . Stroke Daughter   . Heart attack    . Dementia Sister      Prior to Admission medications   Medication Sig Start Date End Date Taking? Authorizing Provider  Calcium Carbonate-Vit D-Min (CALTRATE 600+D PLUS MINERALS) 600-800 MG-UNIT TABS Take 1 tablet by mouth daily.   Yes Historical Provider, MD  CARAFATE 1 GM/10ML suspension TAKE 10 MLS (1 G TOTAL) BY MOUTH 4 (FOUR) TIMES DAILY - WITH MEALS AND AT BEDTIME. 10/04/15  Yes Biagio Borg, MD  cyanocobalamin ([employer] VITAMIN B12) 1000 MCG tablet Take 1 tablet (1,000 mcg total) by mouth daily. 05/19/15  Yes Biagio Borg, MD  donepezil (ARICEPT) 5 MG tablet Take 1 tablet (5 mg total) by mouth at bedtime. 03/09/15  Yes Janece Canterbury, MD  feeding supplement, ENSURE ENLIVE, (ENSURE ENLIVE) LIQD Take 237 mLs by mouth  3 (three) times daily between meals. 10/25/15  Yes Lavina Hamman, MD  isosorbide mononitrate (IMDUR) 30 MG 24 hr tablet Take 1 tablet (30 mg total) by mouth daily. 05/19/15  Yes Biagio Borg, MD  levothyroxine (SYNTHROID, LEVOTHROID) 88 MCG tablet TAKE 1 TABLET (88 MCG TOTAL) BY MOUTH DAILY BEFORE BREAKFAST. 03/25/15  Yes Biagio Borg, MD  lisinopril (PRINIVIL,ZESTRIL) 10 MG tablet TAKE 1 TABLET EVERY DAY Patient taking differently: TAKE 1 TABLET BY MOUTH EVERY DAY 03/25/15  Yes Biagio Borg, MD  polyethylene glycol powder (MIRALAX) powder Take 17 g by mouth daily. Patient taking differently: Take 17 g by mouth 3 (three) times a week. Mon, Wed, Fri. 03/09/15  Yes Janece Canterbury, MD  sertraline (ZOLOFT) 25 MG tablet TAKE 1 TABLET (25 MG TOTAL) BY MOUTH DAILY. 06/01/15  Yes Biagio Borg, MD  sodium chloride (OCEAN) 0.65 % SOLN nasal spray Place 1 spray into both nostrils daily as needed for congestion.   Yes Historical Provider, MD  acetaminophen (TYLENOL) 500 MG tablet Take 500 mg by mouth every 6 (six) hours as needed for moderate pain.    Historical Provider, MD  loratadine (CLARITIN) 10 MG tablet Take 10 mg by mouth daily as needed for allergies.    Historical Provider, MD  nitroGLYCERIN (NITROSTAT) 0.4 MG SL tablet Place 1 tablet (0.4 mg total) under the tongue every 5 (five) minutes as needed. Chest pains 10/20/14   Biagio Borg, MD  nystatin (MYCOSTATIN) 100000 UNIT/ML suspension Take 5 mLs (500,000 Units total) by mouth 4 (four) times daily. 11/03/15   Biagio Borg, MD  polyvinyl alcohol (LIQUIFILM TEARS) 1.4 % ophthalmic solution Place 1 drop into both eyes 2 (two) times daily as needed for dry eyes.    Historical Provider, MD  traMADol (ULTRAM) 50 MG tablet Take 1 tablet (50 mg total) by mouth every 12 (twelve) hours as needed for severe pain. 06/19/15   Virgel Manifold, MD    No Known Allergies  Physical Exam  Vitals  Blood pressure 161/77, pulse 71, temperature 97.4 F (36.3 C), temperature  source Oral, resp. rate 24, SpO2 98 %.   1. General elderly female in no acute distress   2.Flat  affect and insight, confused.  3. No F.N deficits, ALL C.Nerves Intact, Strength  5/5 all 4 extremities, Sensation intact all 4 extremities, Plantars down going.  4. Ears and Eyes appear Normal, Conjunctivae clear, PERRLA. Moist Oral Mucosa.  5. Supple Neck, No JVD, No cervical lymphadenopathy appriciated, No Carotid Bruits.  6. Symmetrical Chest wall movement, Good air movement bilaterally, CTAB.  7. RRR, No Gallops, Rubs or Murmurs, No Parasternal Heave.  8. Positive Bowel Sounds, Abdomen Soft, Non tender, No organomegaly appriciated,No rebound -guarding or rigidity.  9.  No Cyanosis, Normal Skin Turgor, No Skin Rash or Bruise.  10. Good muscle tone,  joints appear normal , no effusions, Normal ROM.    Data Review  CBC  Recent Labs Lab 11/03/15 2207  WBC 6.2  HGB 10.2*  HCT 32.4*  PLT 261  MCV 88.0  MCH 27.7  MCHC 31.5  RDW 15.4   ------------------------------------------------------------------------------------------------------------------  Chemistries   Recent Labs Lab 11/03/15 2207  NA 141  K 4.2  CL 103  CO2 28  GLUCOSE 129*  BUN 20  CREATININE 1.07*  CALCIUM 9.2   ------------------------------------------------------------------------------------------------------------------ estimated creatinine clearance is 20 mL/min (by C-G formula based on Cr of 1.07). ------------------------------------------------------------------------------------------------------------------ No results for input(s): TSH, T4TOTAL, T3FREE, THYROIDAB in the last 72 hours.  Invalid input(s): FREET3   Coagulation profile No results for input(s): INR, PROTIME in the last 168 hours. ------------------------------------------------------------------------------------------------------------------- No results for input(s): DDIMER in the last 72  hours. -------------------------------------------------------------------------------------------------------------------  Cardiac Enzymes No results for input(s): CKMB, TROPONINI, MYOGLOBIN in the last 168 hours.  Invalid input(s): CK ------------------------------------------------------------------------------------------------------------------ Invalid input(s): POCBNP   ---------------------------------------------------------------------------------------------------------------  Urinalysis    Component Value Date/Time   COLORURINE YELLOW 05/10/2015 1612   APPEARANCEUR CLEAR 05/10/2015 1612   LABSPEC 1.017 05/10/2015 1612   PHURINE 7.0 05/10/2015 1612   GLUCOSEU NEGATIVE 05/10/2015 1612   GLUCOSEU NEGATIVE 01/06/2015 1541   HGBUR NEGATIVE 05/10/2015 1612   BILIRUBINUR NEGATIVE 05/10/2015 1612   KETONESUR NEGATIVE 05/10/2015 1612   PROTEINUR NEGATIVE 05/10/2015 1612   UROBILINOGEN 0.2 05/10/2015 1612   NITRITE NEGATIVE 05/10/2015 1612   LEUKOCYTESUR NEGATIVE 05/10/2015 1612    ----------------------------------------------------------------------------------------------------------------  Imaging results:   Dg Chest 2 View  10/23/2015  CLINICAL DATA:  Syncope EXAM: CHEST  2 VIEW COMPARISON:  08/01/2015 FINDINGS: Heart size is enlarged. Uncoiled aorta. Negative for heart failure. Lungs are clear without infiltrate or effusion. No change from the prior study. IMPRESSION: No active cardiopulmonary disease. Electronically Signed   By: Franchot Gallo M.D.   On: 10/23/2015 17:58      Assessment & Plan  1. GI bleed, probably lower, probably diverticular bleed     Observation     Consult GI in a.m.     Doubt the need for colonoscopy or gastroscopy at this time     Supportive care     Hold blood pressure medication  2 . History of hypertension, cardiomyopathy and lower back pain      Continue medications     DVT ProphylaxisSCDs  AM Labs Ordered, also please review  Full Orders  Family Communication: Admission, patients condition and plan of care including tests being ordered have been discussed with the patient and daughter  who indicate understanding and agree with the plan and Code Status.  Code Status DO NOT RESUSCITATE  Disposition Plan: Home  Time spent in minutes : 32 minutes  Condition GUARDED   @SIGNATURE @

## 2015-11-03 NOTE — Patient Instructions (Addendum)
Please take all new medication as prescribed - the mouth solution for the thrush  Please continue all other medications as before, and refills have been done if requested.  Please have the pharmacy call with any other refills you may need.  Please continue your efforts at being more active, low cholesterol diet, and weight control.  You are otherwise up to date with prevention measures today.  Please keep your appointments with your specialists as you may have planned

## 2015-11-03 NOTE — ED Notes (Addendum)
Family states pt was hospitalized on the 4th for a syncopal episode after she vagaled down while trying to have a bowel movement and was discharged on the 6th  Family states today pt has been c/o her stomach hurting  Pt went to her follow up visit today and states everything checked out okay  After the appt they went out to eat and when she got home she went to the restroom and passed a large amount of maroon colored liquid from her rectum  Family states she also had some dry heaving but no vomiting  Pt continues to c/o abd pain

## 2015-11-03 NOTE — ED Provider Notes (Signed)
CSN: [id number]     Arrival date & time 11/03/15  2131 History   First MD Initiated Contact with Patient 11/03/15 2200     Chief Complaint  Patient presents with  . Rectal Bleeding   (Consider location/radiation/quality/duration/timing/severity/associated sxs/prior Treatment) HPI 82 y.o. female with a hx of HTN, GI Bleed 2012/2015, and recent syncopal episode on 10-23-15, presents to the Emergency Department today complaining of rectal bleeding that began this afternoon. Pt daughter notes that they were just DCed on the 6th from [facility] due to syncopal episode on the 4th. Thought to be Vaso-Vagal due to defecation as it occurred when patient was on toilet. Today, patient saw her PCP for follow up and went to go eat. Patient began having abdominal pain during that time and felt the need to defecate. On return home, patient had maroon colored stool in toilet as well as water. Has had previous hx of diverticular bleeds where she needed to be transfused. No CP/SOB. No headaches. No numbness/tingling. No rectal pain. No other symptoms noted.    Past Medical History  Diagnosis Date  . Internal hemorrhoid 07/2012  . Diverticulosis of colon (without mention of hemorrhage) 07/2012  . Herpes zoster   . Glaucoma   . Lumbar back pain   . Hypertension   . Cardiomyopathy   . Hypothyroid   . Myocardial infarction ([place])   . History of blood transfusion     "related to diverticulitis" (12/02/2013)  . GERD (gastroesophageal reflux disease)   . Arthritis     "joints" (12/02/2013)  . Depression   . Anxiety   . Adenomatous colon polyp 07/2012    sessile cecal polyp.  no high grade dysplasia.   Marland Kitchen Hyponatremia 07/2012    Na as low as 114.   . Protein calorie malnutrition ([place]) 07/2012    BMI then: 16.5  . Zenker diverticulum 07/2012    this and esophageal dysmotility on esophagram.   . Psychosis   . Behavior disturbance   . Diverticulitis    Past Surgical History  Procedure Laterality Date  .  Thyroidectomy    . Appendectomy  2005    ruptured appendix  . Tubal ligation    . Colonoscopy  07/19/2012    Procedure: COLONOSCOPY;  Surgeon: Ladene Artist, MD,FACG;  Location: [facility] ENDOSCOPY;  Service: Endoscopy;  Laterality: N/A;  . Cataract extraction w/ intraocular lens  implant, bilateral Bilateral   . Glaucoma surgery Right    Family History  Problem Relation Age of Onset  . Colon cancer Neg Hx   . Stroke Daughter   . Heart attack    . Dementia Sister    Social History  Substance Use Topics  . Smoking status: Never Smoker   . Smokeless tobacco: Never Used  . Alcohol Use: No   OB History    No data available     Review of Systems ROS reviewed and all are negative for acute change except as noted in the HPI.  Allergies  Review of patient's allergies indicates no known allergies.  Home Medications   Prior to Admission medications   Medication Sig Start Date End Date Taking? Authorizing Provider  Calcium Carbonate-Vit D-Min (CALTRATE 600+D PLUS MINERALS) 600-800 MG-UNIT TABS Take 1 tablet by mouth daily.   Yes Historical Provider, MD  CARAFATE 1 GM/10ML suspension TAKE 10 MLS (1 G TOTAL) BY MOUTH 4 (FOUR) TIMES DAILY - WITH MEALS AND AT BEDTIME. 10/04/15  Yes Biagio Borg, MD  cyanocobalamin ([employer] VITAMIN B12)  1000 MCG tablet Take 1 tablet (1,000 mcg total) by mouth daily. 05/19/15  Yes Biagio Borg, MD  donepezil (ARICEPT) 5 MG tablet Take 1 tablet (5 mg total) by mouth at bedtime. 03/09/15  Yes Janece Canterbury, MD  feeding supplement, ENSURE ENLIVE, (ENSURE ENLIVE) LIQD Take 237 mLs by mouth 3 (three) times daily between meals. 10/25/15  Yes Lavina Hamman, MD  isosorbide mononitrate (IMDUR) 30 MG 24 hr tablet Take 1 tablet (30 mg total) by mouth daily. 05/19/15  Yes Biagio Borg, MD  levothyroxine (SYNTHROID, LEVOTHROID) 88 MCG tablet TAKE 1 TABLET (88 MCG TOTAL) BY MOUTH DAILY BEFORE BREAKFAST. 03/25/15  Yes Biagio Borg, MD  lisinopril (PRINIVIL,ZESTRIL) 10 MG tablet TAKE 1  TABLET EVERY DAY Patient taking differently: TAKE 1 TABLET BY MOUTH EVERY DAY 03/25/15  Yes Biagio Borg, MD  polyethylene glycol powder (MIRALAX) powder Take 17 g by mouth daily. Patient taking differently: Take 17 g by mouth 3 (three) times a week. Mon, Wed, Fri. 03/09/15  Yes Janece Canterbury, MD  sertraline (ZOLOFT) 25 MG tablet TAKE 1 TABLET (25 MG TOTAL) BY MOUTH DAILY. 06/01/15  Yes Biagio Borg, MD  sodium chloride (OCEAN) 0.65 % SOLN nasal spray Place 1 spray into both nostrils daily as needed for congestion.   Yes Historical Provider, MD  acetaminophen (TYLENOL) 500 MG tablet Take 500 mg by mouth every 6 (six) hours as needed for moderate pain.    Historical Provider, MD  loratadine (CLARITIN) 10 MG tablet Take 10 mg by mouth daily as needed for allergies.    Historical Provider, MD  nitroGLYCERIN (NITROSTAT) 0.4 MG SL tablet Place 1 tablet (0.4 mg total) under the tongue every 5 (five) minutes as needed. Chest pains 10/20/14   Biagio Borg, MD  nystatin (MYCOSTATIN) 100000 UNIT/ML suspension Take 5 mLs (500,000 Units total) by mouth 4 (four) times daily. 11/03/15   Biagio Borg, MD  polyvinyl alcohol (LIQUIFILM TEARS) 1.4 % ophthalmic solution Place 1 drop into both eyes 2 (two) times daily as needed for dry eyes.    Historical Provider, MD  traMADol (ULTRAM) 50 MG tablet Take 1 tablet (50 mg total) by mouth every 12 (twelve) hours as needed for severe pain. 06/19/15   Virgel Manifold, MD   BP 161/77 mmHg  Pulse 71  Temp(Src) 97.4 F (36.3 C) (Oral)  Resp 24  SpO2 98%   Physical Exam  Constitutional: She is oriented to person, place, and time. She appears well-developed and well-nourished.  HENT:  Head: Normocephalic and atraumatic.  Eyes: EOM are normal. Pupils are equal, round, and reactive to light.  Neck: Normal range of motion. Neck supple.  Cardiovascular: Normal rate, regular rhythm and normal heart sounds.   Pulmonary/Chest: Effort normal and breath sounds normal.  Abdominal:  Soft. Normal appearance and bowel sounds are normal. There is generalized tenderness.  Musculoskeletal: Normal range of motion.  Neurological: She is alert and oriented to person, place, and time.  Skin: Skin is warm and dry.  Psychiatric: She has a normal mood and affect. Her behavior is normal. Thought content normal.  Nursing note and vitals reviewed.  Rectal Exam: Chaperone was present. There are no external fissures noted. No induration of the skin or swelling. No external hemorrhoids seen. Patient able to tolerate examination. I was able to feel the first 2-3cm of the rectum digitally without gross abnormality. There was minimal bright red blood noted.  No signs of perirectal abscess. Occult stool positive.  ED Course  Procedures (including critical care time) Labs Review Labs Reviewed  CBC - Abnormal; Notable for the following:    RBC 3.68 (*)    Hemoglobin 10.2 (*)    HCT 32.4 (*)    All other components within normal limits  BASIC METABOLIC PANEL - Abnormal; Notable for the following:    Glucose, Bld 129 (*)    Creatinine, Ser 1.07 (*)    GFR calc non Af Amer 42 (*)    GFR calc Af Amer 49 (*)    All other components within normal limits  TYPE AND SCREEN   Imaging Review No results found. I have personally reviewed and evaluated these images and lab results as part of my medical decision-making.   EKG Interpretation None     MDM  I have reviewed relevant laboratory values. I have reviewed the relevant previous healthcare records. I obtained HPI from historian. Patient discussed with supervising physician  ED Course:  Assessment: 53 y F with hx Diverticular bleeds in 2012, 2015 presents with rectal bleeding that started today. Maroon colored stool/liquid noted by picture from daughter. Seen by [facility] previously for diverticular bleeds. On exam, patient diffusely tender on abdomen. Pt in NAD. VSS. Afebrile. Lungs CTA. Heart RRR. Rectal exam showed no external  hemorrhoids. Minor bleeding noted on DRE. Pos Occult stool. Labs appear baseline. No drop in Hgb from previous visit. No leukocytosis. Will consult with medicine for admit for observation. Patient is in no acute distress. Vital Signs are stable.    Disposition/Plan:  Admit Pt acknowledges and agrees with plan  Supervising Physician Courteney Julio Alm, MD  Final diagnoses:  Rectal bleeding        Shary Decamp, PA-C 11/03/15 [place], MD 11/03/15 2350

## 2015-11-03 NOTE — Progress Notes (Signed)
Subjective:    Patient ID: [name], female    DOB: 1917-05-30, 82 y.o.   MRN: [id number]  HPI  Here with family, no further syncope, Here to f/u; hx limited by dementia, overall doing ok,  Pt denies chest pain, increasing sob or doe, wheezing, orthopnea, PND, increased LE swelling, palpitations, dizziness or syncope.  Pt denies new neurological symptoms such as new headache, or facial or extremity weakness or numbness.  Pt denies polydipsia, polyuria, or low sugar episode.   Pt denies new neurological symptoms such as new headache, or facial or extremity weakness or numbness.   Pt states overall good compliance with meds, Does have thrush to mouth worse in the past wk.  Does have some occas agitation, but family does not think she needs further meds Past Medical History  Diagnosis Date  . Internal hemorrhoid 07/2012  . Diverticulosis of colon (without mention of hemorrhage) 07/2012  . Herpes zoster   . Glaucoma   . Lumbar back pain   . Hypertension   . Cardiomyopathy   . Hypothyroid   . Myocardial infarction ([place])   . History of blood transfusion     "related to diverticulitis" (12/02/2013)  . GERD (gastroesophageal reflux disease)   . Depression   . Anxiety   . Hyponatremia 07/2012    Na as low as 114.   . Protein calorie malnutrition ([place]) 07/2012    BMI then: 16.5  . Zenker diverticulum 07/2012    this and esophageal dysmotility on esophagram.   . Psychosis   . Behavior disturbance   . Diverticulitis   . Colon cancer [facility])     daughter denis colon cancer only diverticulitis  . Arthritis     "joints" (12/02/2013)   Past Surgical History  Procedure Laterality Date  . Thyroidectomy    . Appendectomy  2005    ruptured appendix  . Tubal ligation    . Colonoscopy  07/19/2012    Procedure: COLONOSCOPY;  Surgeon: Ladene Artist, MD,FACG;  Location: [facility] ENDOSCOPY;  Service: Endoscopy;  Laterality: N/A;  . Cataract extraction w/ intraocular lens  implant, bilateral Bilateral    . Glaucoma surgery Right     reports that she has never smoked. She has never used smokeless tobacco. She reports that she does not drink alcohol or use illicit drugs. family history includes Dementia in her sister; Stroke in her daughter. There is no history of Colon cancer. No Known Allergies No current facility-administered medications on file prior to visit.   Current Outpatient Prescriptions on File Prior to Visit  Medication Sig Dispense Refill  . acetaminophen (TYLENOL) 500 MG tablet Take 500 mg by mouth every 6 (six) hours as needed for moderate pain.    . Calcium Carbonate-Vit D-Min (CALTRATE 600+D PLUS MINERALS) 600-800 MG-UNIT TABS Take 1 tablet by mouth daily.    Marland Kitchen CARAFATE 1 GM/10ML suspension TAKE 10 MLS (1 G TOTAL) BY MOUTH 4 (FOUR) TIMES DAILY - WITH MEALS AND AT BEDTIME. 420 mL 3  . cyanocobalamin ([employer] VITAMIN B12) 1000 MCG tablet Take 1 tablet (1,000 mcg total) by mouth daily. 30 tablet 11  . donepezil (ARICEPT) 5 MG tablet Take 1 tablet (5 mg total) by mouth at bedtime. 30 tablet 0  . feeding supplement, ENSURE ENLIVE, (ENSURE ENLIVE) LIQD Take 237 mLs by mouth 3 (three) times daily between meals. 237 mL 12  . isosorbide mononitrate (IMDUR) 30 MG 24 hr tablet Take 1 tablet (30 mg total) by mouth daily. 30 tablet  11  . levothyroxine (SYNTHROID, LEVOTHROID) 88 MCG tablet TAKE 1 TABLET (88 MCG TOTAL) BY MOUTH DAILY BEFORE BREAKFAST. 30 tablet 11  . lisinopril (PRINIVIL,ZESTRIL) 10 MG tablet TAKE 1 TABLET EVERY DAY (Patient taking differently: TAKE 1 TABLET BY MOUTH EVERY DAY) 30 tablet 11  . loratadine (CLARITIN) 10 MG tablet Take 10 mg by mouth daily as needed for allergies.    . nitroGLYCERIN (NITROSTAT) 0.4 MG SL tablet Place 1 tablet (0.4 mg total) under the tongue every 5 (five) minutes as needed. Chest pains 30 tablet 6  . polyethylene glycol powder (MIRALAX) powder Take 17 g by mouth daily. (Patient taking differently: Take 17 g by mouth 3 (three) times a week. Mon, Wed,  Fri.) 850 g 0  . polyvinyl alcohol (LIQUIFILM TEARS) 1.4 % ophthalmic solution Place 1 drop into both eyes 2 (two) times daily as needed for dry eyes.    Marland Kitchen sertraline (ZOLOFT) 25 MG tablet TAKE 1 TABLET (25 MG TOTAL) BY MOUTH DAILY. 30 tablet 5  . sodium chloride (OCEAN) 0.65 % SOLN nasal spray Place 1 spray into both nostrils daily as needed for congestion.    . traMADol (ULTRAM) 50 MG tablet Take 1 tablet (50 mg total) by mouth every 12 (twelve) hours as needed for severe pain. 6 tablet 0  . [DISCONTINUED] isosorbide dinitrate (ISORDIL) 30 MG tablet Take 30 mg by mouth 3 (three) times daily.      . [DISCONTINUED] ranitidine (ZANTAC) 150 MG tablet Take 150 mg by mouth 2 (two) times daily.       Review of Systems  Constitutional: Negative for unusual diaphoresis or night sweats HENT: Negative for ringing in ear or discharge Eyes: Negative for double vision or worsening visual disturbance.  Respiratory: Negative for choking and stridor.   Gastrointestinal: Negative for vomiting or other signifcant bowel change Genitourinary: Negative for hematuria or change in urine volume.  Musculoskeletal: Negative for other MSK pain or swelling Skin: Negative for color change and worsening wound.  Neurological: Negative for tremors and numbness other than noted  Psychiatric/Behavioral: Negative for decreased concentration or agitation other than above       Objective:   Physical Exam BP 100/76 mmHg  Pulse 77  Temp(Src) 98.4 F (36.9 C) (Oral)  Resp 20  Wt 91 lb (41.277 kg)  SpO2 99% VS noted, in wheelchair Constitutional: Pt appears in no significant distress HENT: Head: NCAT.  Right Ear: External ear normal.  Left Ear: External ear normal.  Mouth with reddish coating tongue dorsal c/w red thrush Eyes: . Pupils are equal, round, and reactive to light. Conjunctivae and EOM are normal Neck: Normal range of motion. Neck supple.  Cardiovascular: Normal rate and regular rhythm.   Pulmonary/Chest:  Effort normal and breath sounds without rales or wheezing.  Abd:  Soft, NT, ND, + BS Neurological: Pt is alert. Not confused , motor grossly intact Skin: Skin is warm. No rash, no LE edema Psychiatric: Pt without agitation.     Assessment & Plan:

## 2015-11-03 NOTE — ED Notes (Signed)
Hemocult completed by PA.

## 2015-11-04 ENCOUNTER — Encounter (HOSPITAL_COMMUNITY): Payer: Self-pay | Admitting: *Deleted

## 2015-11-04 DIAGNOSIS — K625 Hemorrhage of anus and rectum: Secondary | ICD-10-CM | POA: Diagnosis not present

## 2015-11-04 DIAGNOSIS — K922 Gastrointestinal hemorrhage, unspecified: Secondary | ICD-10-CM | POA: Diagnosis not present

## 2015-11-04 LAB — PREPARE RBC (CROSSMATCH)

## 2015-11-04 LAB — HEMOGLOBIN AND HEMATOCRIT, BLOOD
HCT: 31.3 % — ABNORMAL LOW (ref 36.0–46.0)
HEMATOCRIT: 31.8 % — AB (ref 36.0–46.0)
HEMATOCRIT: 30.3 % — AB (ref 36.0–46.0)
HEMATOCRIT: 33 % — AB (ref 36.0–46.0)
HEMOGLOBIN: 9.6 g/dL — AB (ref 12.0–15.0)
HEMOGLOBIN: 10.3 g/dL — AB (ref 12.0–15.0)
Hemoglobin: 10.1 g/dL — ABNORMAL LOW (ref 12.0–15.0)
Hemoglobin: 10.1 g/dL — ABNORMAL LOW (ref 12.0–15.0)

## 2015-11-04 LAB — PROTIME-INR
INR: 0.95 (ref 0.00–1.49)
Prothrombin Time: 12.9 seconds (ref 11.6–15.2)

## 2015-11-04 LAB — OCCULT BLOOD X 1 CARD TO LAB, STOOL: FECAL OCCULT BLD: POSITIVE — AB

## 2015-11-04 MED ORDER — ENSURE ENLIVE PO LIQD
237.0000 mL | Freq: Three times a day (TID) | ORAL | Status: DC
  Administered 2015-11-04 – 2015-11-08 (×9): 237 mL via ORAL

## 2015-11-04 MED ORDER — SERTRALINE HCL 50 MG PO TABS
25.0000 mg | ORAL_TABLET | Freq: Every day | ORAL | Status: DC
  Administered 2015-11-04 – 2015-11-08 (×5): 25 mg via ORAL
  Filled 2015-11-04 (×5): qty 1

## 2015-11-04 MED ORDER — POLYETHYLENE GLYCOL 3350 17 G PO PACK
17.0000 g | PACK | Freq: Every day | ORAL | Status: DC
  Administered 2015-11-04 – 2015-11-08 (×4): 17 g via ORAL
  Filled 2015-11-04 (×4): qty 1

## 2015-11-04 MED ORDER — SUCRALFATE 1 GM/10ML PO SUSP
1.0000 g | Freq: Four times a day (QID) | ORAL | Status: DC
  Administered 2015-11-04 – 2015-11-08 (×15): 1 g via ORAL
  Filled 2015-11-04 (×16): qty 10

## 2015-11-04 MED ORDER — POLYVINYL ALCOHOL 1.4 % OP SOLN
1.0000 [drp] | Freq: Two times a day (BID) | OPHTHALMIC | Status: DC | PRN
  Filled 2015-11-04: qty 15

## 2015-11-04 MED ORDER — NITROGLYCERIN 0.4 MG SL SUBL: 0.4000 mg | SUBLINGUAL_TABLET | SUBLINGUAL | Status: DC | PRN

## 2015-11-04 MED ORDER — ISOSORBIDE MONONITRATE ER 30 MG PO TB24
30.0000 mg | ORAL_TABLET | Freq: Every day | ORAL | Status: DC
  Administered 2015-11-04 – 2015-11-08 (×3): 30 mg via ORAL
  Filled 2015-11-04 (×4): qty 1

## 2015-11-04 MED ORDER — POLYETHYLENE GLYCOL 3350 17 G PO PACK
17.0000 g | PACK | Freq: Every day | ORAL | Status: DC
Start: 2015-11-04 — End: 2015-11-04

## 2015-11-04 MED ORDER — DONEPEZIL HCL 10 MG PO TABS
5.0000 mg | ORAL_TABLET | Freq: Every day | ORAL | Status: DC
  Administered 2015-11-04 – 2015-11-07 (×4): 5 mg via ORAL
  Filled 2015-11-04 (×5): qty 1

## 2015-11-04 MED ORDER — LEVOTHYROXINE SODIUM 88 MCG PO TABS
88.0000 ug | ORAL_TABLET | Freq: Every day | ORAL | Status: DC
Start: 2015-11-04 — End: 2015-11-08
  Administered 2015-11-04 – 2015-11-08 (×4): 88 ug via ORAL
  Filled 2015-11-04 (×5): qty 1

## 2015-11-04 MED ORDER — SODIUM CHLORIDE 0.9 % IV SOLN
INTRAVENOUS | Status: DC
  Administered 2015-11-04 – 2015-11-06 (×4): via INTRAVENOUS
  Administered 2015-11-07: 1000 mL via INTRAVENOUS

## 2015-11-04 MED ORDER — ONDANSETRON HCL 4 MG/2ML IJ SOLN: 4.0000 mg | Freq: Four times a day (QID) | INTRAMUSCULAR | Status: DC | PRN

## 2015-11-04 MED ORDER — SODIUM CHLORIDE 0.9 % IV SOLN: Freq: Once | INTRAVENOUS | Status: DC

## 2015-11-04 MED ORDER — ACETAMINOPHEN 500 MG PO TABS: 500.0000 mg | ORAL_TABLET | Freq: Four times a day (QID) | ORAL | Status: DC | PRN

## 2015-11-04 MED ORDER — HYDRALAZINE HCL 20 MG/ML IJ SOLN: 5.0000 mg | Freq: Four times a day (QID) | INTRAMUSCULAR | Status: DC | PRN

## 2015-11-04 MED ORDER — VITAMIN B-12 1000 MCG PO TABS
1000.0000 ug | ORAL_TABLET | Freq: Every day | ORAL | Status: DC
  Administered 2015-11-04 – 2015-11-08 (×5): 1000 ug via ORAL
  Filled 2015-11-04 (×6): qty 1

## 2015-11-04 MED ORDER — CALCIUM CARBONATE-VITAMIN D 500-200 MG-UNIT PO TABS
1.0000 | ORAL_TABLET | Freq: Every day | ORAL | Status: DC
  Administered 2015-11-04 – 2015-11-08 (×4): 1 via ORAL
  Filled 2015-11-04 (×6): qty 1

## 2015-11-04 MED ORDER — NYSTATIN 100000 UNIT/ML MT SUSP
500000.0000 [IU] | Freq: Four times a day (QID) | OROMUCOSAL | Status: DC
  Administered 2015-11-04 – 2015-11-08 (×15): 500000 [IU] via ORAL
  Filled 2015-11-04 (×16): qty 5

## 2015-11-04 MED ORDER — ONDANSETRON HCL 4 MG PO TABS: 4.0000 mg | ORAL_TABLET | Freq: Four times a day (QID) | ORAL | Status: DC | PRN

## 2015-11-04 MED ORDER — TRAMADOL HCL 50 MG PO TABS: 50.0000 mg | ORAL_TABLET | Freq: Two times a day (BID) | ORAL | Status: DC | PRN

## 2015-11-04 MED ORDER — HYDROCODONE-ACETAMINOPHEN 5-325 MG PO TABS: 1.0000 | ORAL_TABLET | ORAL | Status: DC | PRN

## 2015-11-04 MED ORDER — PANTOPRAZOLE SODIUM 40 MG IV SOLR
40.0000 mg | Freq: Every day | INTRAVENOUS | Status: DC
  Administered 2015-11-04 – 2015-11-06 (×3): 40 mg via INTRAVENOUS
  Filled 2015-11-04 (×3): qty 40

## 2015-11-04 MED ORDER — LORATADINE 10 MG PO TABS: 10.0000 mg | ORAL_TABLET | Freq: Every day | ORAL | Status: DC | PRN

## 2015-11-04 MED ORDER — SALINE SPRAY 0.65 % NA SOLN
1.0000 | Freq: Every day | NASAL | Status: DC | PRN
  Filled 2015-11-04: qty 44

## 2015-11-04 NOTE — Progress Notes (Signed)
Nursing Note; Pt arrived last night at [place].IVF @ 50cc/hr.No void. A: Pt scanned for 155 cc. No cath required at this time.Sent message to on-call.wbb

## 2015-11-04 NOTE — Progress Notes (Signed)
Nursing Note: Pt arrived via stretcher and was accompanied by daughter.Pt awake, alert,HOH,follows simple commands,alert and oriented to person and place.T-98.4 BP-130/70 P-70 Pt resting quietly in bed.Oriented to room and use of call bell.Daughter assisted with admission process.Pt lived with daughter prior to admission and had a Home health CNA with pt form 9-5 daily.Pt resting quietly in bed.wbb

## 2015-11-04 NOTE — ED Notes (Signed)
Pt transported to 1338 by Estill Bamberg

## 2015-11-04 NOTE — Progress Notes (Addendum)
[facility] PROGRESS NOTE  [name] [id number] DOB: 11-15-1916 DOA: 11/03/2015 PCP: Cathlean Cower, MD  Summary 11/04/15: I have seen and examined [name] at bedside in the presence of family members and reviewed her chart. She has rectal bleed felt to be due to self limiting diverticular bleed. Less intervention is preferred by family given risk for intervention. Unfortunately, patient still has rectal bleed but Hb remains stable. Will continue to monitor Hb and transfuse prbc if Hb drops below 8g/dl. BP uncontrolled. Will add Hydralazine as needed while patient NPO. Patient non verbal. Plan Rectal bleeding/GI bleed  Monitor h/h  Check PT/INR  Transfuse prbc if Hb<8g/dl  Add protonix  Consider gi consult if bleed worsens or persists for colonoscopy/sigmoidoscopy to arrest bleeding Essential Htn  BP uncontrolled  Hydralzine prn for sbp>1108mmHg Code Status: DNR Family Communication: Daughters at bedside Disposition Plan: To bed determined   Consultants:  None  Procedures:  None  Antibiotics:  None  HPI/Subjective: Non verbal. Nursing and family reported some rectal bleed this afternoon.  Objective: Filed Vitals:   11/04/15 1400 11/04/15 2023  BP: 132/80 190/90  Pulse: 82 78  Temp: 97.6 F (36.4 C) 97.6 F (36.4 C)  Resp: 20 20    Intake/Output Summary (Last 24 hours) at 11/04/15 2140 Last data filed at 11/04/15 1800  Gross per 24 hour  Intake    253 ml  Output     75 ml  Net    178 ml   Filed Weights   11/04/15 0034  Weight: 39.9 kg (87 lb 15.4 oz)    Exam:   General:  Comfortable at rest.  Cardiovascular: S1-S2 normal. No murmurs. Pulse regular.  Respiratory: Good air entry bilaterally. No rhonchi or rales.  Abdomen: Soft and nontender. Normal bowel sounds. No organomegaly.  Musculoskeletal: No pedal edema   Neurological: Moving all extremities.  Data Reviewed: Basic Metabolic Panel:  Recent Labs Lab 11/03/15 2207  NA  141  K 4.2  CL 103  CO2 28  GLUCOSE 129*  BUN 20  CREATININE 1.07*  CALCIUM 9.2   Liver Function Tests: No results for input(s): AST, ALT, ALKPHOS, BILITOT, PROT, ALBUMIN in the last 168 hours. No results for input(s): LIPASE, AMYLASE in the last 168 hours. No results for input(s): AMMONIA in the last 168 hours. CBC:  Recent Labs Lab 11/03/15 2207 11/04/15 0135 11/04/15 0643 11/04/15 1238 11/04/15 1855  WBC 6.2  --   --   --   --   HGB 10.2* 10.1* 9.6* 10.3* 10.1*  HCT 32.4* 31.8* 30.3* 33.0* 31.3*  MCV 88.0  --   --   --   --   PLT 261  --   --   --   --    Cardiac Enzymes: No results for input(s): CKTOTAL, CKMB, CKMBINDEX, TROPONINI in the last 168 hours. BNP (last 3 results) No results for input(s): BNP in the last 8760 hours.  ProBNP (last 3 results) No results for input(s): PROBNP in the last 8760 hours.  CBG: No results for input(s): GLUCAP in the last 168 hours.  No results found for this or any previous visit (from the past 240 hour(s)).   Studies: No results found.  Scheduled Meds: . sodium chloride   Intravenous Once  . calcium-vitamin D  1 tablet Oral Daily  . donepezil  5 mg Oral QHS  . feeding supplement (ENSURE ENLIVE)  237 mL Oral TID BM  . isosorbide mononitrate  30 mg Oral Daily  .  levothyroxine  88 mcg Oral QAC breakfast  . nystatin  500,000 Units Oral QID  . polyethylene glycol  17 g Oral Daily  . sertraline  25 mg Oral Daily  . sucralfate  1 g Oral 4 times per day  . cyanocobalamin  1,000 mcg Oral Daily   Continuous Infusions: . sodium chloride 50 mL/hr at 11/04/15 1955     Time spent: 25 minutes    Romin Divita  [facility] Pager [phone]. If 7PM-7AM, please contact night-coverage at [email or web address], password [facility] 11/04/2015, 9:40 PM

## 2015-11-05 DIAGNOSIS — I10 Essential (primary) hypertension: Secondary | ICD-10-CM | POA: Diagnosis present

## 2015-11-05 DIAGNOSIS — K922 Gastrointestinal hemorrhage, unspecified: Secondary | ICD-10-CM | POA: Diagnosis not present

## 2015-11-05 DIAGNOSIS — K5791 Diverticulosis of intestine, part unspecified, without perforation or abscess with bleeding: Secondary | ICD-10-CM | POA: Diagnosis present

## 2015-11-05 DIAGNOSIS — Z8249 Family history of ischemic heart disease and other diseases of the circulatory system: Secondary | ICD-10-CM | POA: Diagnosis not present

## 2015-11-05 DIAGNOSIS — E876 Hypokalemia: Secondary | ICD-10-CM | POA: Diagnosis present

## 2015-11-05 DIAGNOSIS — H409 Unspecified glaucoma: Secondary | ICD-10-CM | POA: Diagnosis present

## 2015-11-05 DIAGNOSIS — E039 Hypothyroidism, unspecified: Secondary | ICD-10-CM | POA: Diagnosis present

## 2015-11-05 DIAGNOSIS — Z79899 Other long term (current) drug therapy: Secondary | ICD-10-CM | POA: Diagnosis not present

## 2015-11-05 DIAGNOSIS — K625 Hemorrhage of anus and rectum: Secondary | ICD-10-CM | POA: Diagnosis present

## 2015-11-05 DIAGNOSIS — I252 Old myocardial infarction: Secondary | ICD-10-CM | POA: Diagnosis not present

## 2015-11-05 DIAGNOSIS — Z66 Do not resuscitate: Secondary | ICD-10-CM | POA: Diagnosis present

## 2015-11-05 LAB — CBC WITH DIFFERENTIAL/PLATELET
Basophils Absolute: 0 10*3/uL (ref 0.0–0.1)
Basophils Relative: 0 %
EOS PCT: 3 %
Eosinophils Absolute: 0.2 10*3/uL (ref 0.0–0.7)
HCT: 31 % — ABNORMAL LOW (ref 36.0–46.0)
Hemoglobin: 9.8 g/dL — ABNORMAL LOW (ref 12.0–15.0)
LYMPHS PCT: 24 %
Lymphs Abs: 1.2 10*3/uL (ref 0.7–4.0)
MCH: 28 pg (ref 26.0–34.0)
MCHC: 31.6 g/dL (ref 30.0–36.0)
MCV: 88.6 fL (ref 78.0–100.0)
MONO ABS: 0.4 10*3/uL (ref 0.1–1.0)
MONOS PCT: 8 %
NEUTROS ABS: 3.1 10*3/uL (ref 1.7–7.7)
Neutrophils Relative %: 65 %
PLATELETS: 219 10*3/uL (ref 150–400)
RBC: 3.5 MIL/uL — AB (ref 3.87–5.11)
RDW: 15.7 % — AB (ref 11.5–15.5)
WBC: 4.8 10*3/uL (ref 4.0–10.5)

## 2015-11-05 LAB — COMPREHENSIVE METABOLIC PANEL
ALT: 9 U/L — AB (ref 14–54)
ANION GAP: 9 (ref 5–15)
AST: 24 U/L (ref 15–41)
Albumin: 3.4 g/dL — ABNORMAL LOW (ref 3.5–5.0)
Alkaline Phosphatase: 67 U/L (ref 38–126)
BUN: 14 mg/dL (ref 6–20)
CHLORIDE: 105 mmol/L (ref 101–111)
CO2: 26 mmol/L (ref 22–32)
CREATININE: 0.86 mg/dL (ref 0.44–1.00)
Calcium: 9 mg/dL (ref 8.9–10.3)
GFR, EST NON AFRICAN AMERICAN: 55 mL/min — AB (ref >60)
Glucose, Bld: 89 mg/dL (ref 65–99)
POTASSIUM: 4.2 mmol/L (ref 3.5–5.1)
SODIUM: 140 mmol/L (ref 135–145)
Total Bilirubin: 0.9 mg/dL (ref 0.3–1.2)
Total Protein: 6.6 g/dL (ref 6.5–8.1)

## 2015-11-05 LAB — HEMOGLOBIN AND HEMATOCRIT, BLOOD
HCT: 31.1 % — ABNORMAL LOW (ref 36.0–46.0)
HCT: 33.5 % — ABNORMAL LOW (ref 36.0–46.0)
HEMOGLOBIN: 10.6 g/dL — AB (ref 12.0–15.0)
HEMOGLOBIN: 9.8 g/dL — AB (ref 12.0–15.0)

## 2015-11-05 LAB — PROTIME-INR
INR: 0.96 (ref 0.00–1.49)
PROTHROMBIN TIME: 13 s (ref 11.6–15.2)

## 2015-11-05 MED ORDER — AMLODIPINE BESYLATE 5 MG PO TABS
5.0000 mg | ORAL_TABLET | Freq: Every day | ORAL | Status: DC
  Administered 2015-11-05 – 2015-11-08 (×4): 5 mg via ORAL
  Filled 2015-11-05 (×4): qty 1

## 2015-11-05 NOTE — Progress Notes (Signed)
[facility] PROGRESS NOTE  [name] [id number] DOB: 01/14/1917 DOA: 11/03/2015 PCP: Cathlean Cower, MD  Summary 11/04/15: I have seen and examined [name] at bedside in the presence of family members and reviewed her chart. She has rectal bleed felt to be due to self limiting diverticular bleed. Less intervention is preferred by family given risk for intervention. Unfortunately, patient still has rectal bleed but Hb remains stable. Will continue to monitor Hb and transfuse prbc if Hb drops below 8g/dl. BP uncontrolled. Will add Hydralazine as needed while patient NPO. Patient non verbal. 11/05/15: Hemoglobin stable, old blood per rectum, bleeding seems to be receding. Patient more with it today. Will advance diet, and continue to monitor. Plan Rectal bleeding/GI bleed  Continue to Monitor h/h  Check PT/INR  Transfuse prbc if Hb<8g/dl Essential Htn  BP uncontrolled. Add Norvasc  Hydralzine prn for sbp>114mmHg Code Status: DNR Family Communication: Daughters at bedside Disposition Plan: To bed determined   Consultants:  None  Procedures:  None  Antibiotics:  None  HPI/Subjective: Feels better.  Objective: Filed Vitals:   11/05/15 1329 11/05/15 1628  BP: 182/90 127/70  Pulse: 72 64  Temp: 97.9 F (36.6 C) 97.7 F (36.5 C)  Resp: 16 18    Intake/Output Summary (Last 24 hours) at 11/05/15 1939 Last data filed at 11/05/15 1329  Gross per 24 hour  Intake     25 ml  Output      0 ml  Net     25 ml   Filed Weights   11/04/15 0034 11/05/15 1628  Weight: 39.9 kg (87 lb 15.4 oz) 74.39 kg (164 lb)    Exam:   General:  Comfortable at rest.  Cardiovascular: S1-S2 normal. No murmurs. Pulse regular.  Respiratory: Good air entry bilaterally. No rhonchi or rales.  Abdomen: Soft and nontender. Normal bowel sounds. No organomegaly.  Musculoskeletal: No pedal edema   Neurological: Intact  Data Reviewed: Basic Metabolic Panel:  Recent Labs Lab  11/03/15 2207 11/05/15 0639  NA 141 140  K 4.2 4.2  CL 103 105  CO2 28 26  GLUCOSE 129* 89  BUN 20 14  CREATININE 1.07* 0.86  CALCIUM 9.2 9.0   Liver Function Tests:  Recent Labs Lab 11/05/15 0639  AST 24  ALT 9*  ALKPHOS 67  BILITOT 0.9  PROT 6.6  ALBUMIN 3.4*   No results for input(s): LIPASE, AMYLASE in the last 168 hours. No results for input(s): AMMONIA in the last 168 hours. CBC:  Recent Labs Lab 11/03/15 2207  11/04/15 1238 11/04/15 1855 11/05/15 0016 11/05/15 0639 11/05/15 1231  WBC 6.2  --   --   --   --  4.8  --   NEUTROABS  --   --   --   --   --  3.1  --   HGB 10.2*  < > 10.3* 10.1* 9.8* 9.8* 10.6*  HCT 32.4*  < > 33.0* 31.3* 31.1* 31.0* 33.5*  MCV 88.0  --   --   --   --  88.6  --   PLT 261  --   --   --   --  219  --   < > = values in this interval not displayed. Cardiac Enzymes: No results for input(s): CKTOTAL, CKMB, CKMBINDEX, TROPONINI in the last 168 hours. BNP (last 3 results) No results for input(s): BNP in the last 8760 hours.  ProBNP (last 3 results) No results for input(s): PROBNP in the last 8760 hours.  CBG: No results for input(s): GLUCAP in the last 168 hours.  No results found for this or any previous visit (from the past 240 hour(s)).   Studies: No results found.  Scheduled Meds: . sodium chloride   Intravenous Once  . calcium-vitamin D  1 tablet Oral Daily  . donepezil  5 mg Oral QHS  . feeding supplement (ENSURE ENLIVE)  237 mL Oral TID BM  . isosorbide mononitrate  30 mg Oral Daily  . levothyroxine  88 mcg Oral QAC breakfast  . nystatin  500,000 Units Oral QID  . pantoprazole (PROTONIX) IV  40 mg Intravenous QHS  . polyethylene glycol  17 g Oral Daily  . sertraline  25 mg Oral Daily  . sucralfate  1 g Oral 4 times per day  . cyanocobalamin  1,000 mcg Oral Daily   Continuous Infusions: . sodium chloride 50 mL/hr at 11/05/15 1525     Time spent: 25 minutes    Nivin Braniff  [facility] Pager  [phone]. If 7PM-7AM, please contact night-coverage at [email or web address], password [facility] 11/05/2015, 7:39 PM  LOS: 0 days

## 2015-11-05 NOTE — Plan of Care (Signed)
Problem: Fluid Volume: Goal: Will show no signs and symptoms of excessive bleeding Outcome: Progressing H&H and VS remaining stable.

## 2015-11-05 NOTE — Plan of Care (Signed)
Problem: Bowel/Gastric: Goal: Will show no signs and symptoms of gastrointestinal bleeding Outcome: Not Progressing Bloody stool x1 2/16.

## 2015-11-05 NOTE — Progress Notes (Signed)
Initial Nutrition Assessment  DOCUMENTATION CODES:   Severe malnutrition in context of chronic illness, Underweight  INTERVENTION:  - Continue Ensure Enlive po BID, each supplement provides 350 kcal and 20 grams of protein - Continue FLD and advance diet as medically feasible - Tech/RN to assist with ordering meals and feeding pt if private CNA or family are not present - RD will continue to monitor for needs  NUTRITION DIAGNOSIS:   Malnutrition related to chronic illness as evidenced by severe depletion of muscle mass, moderate depletion of body fat.  GOAL:   Patient will meet greater than or equal to 90% of their needs  MONITOR:   PO intake, Supplement acceptance, Weight trends, Labs, Skin, I & O's  REASON FOR ASSESSMENT:   Low Braden  ASSESSMENT:   82 y.o. female, with history of diverticulosis who was brought by her daughter today after she noticed maroon color stools in the bathroom . She reports that her mother complained of lower abdominal pain followed by loose bowel movement. Patient is tired and cannot give any significant history and most of the history was taken from her daughter at bedside. Her daughter lives with her and helps take care of her in the same apartment . No history of fevers, chills, nausea, vomiting or loss of consciousness. Patient had 2 episodes of GI bleed in 2012 and 2014 and had a previous scope with the diagnosis of diverticulosis in the past.  Pt seen for low Braden. BMI indicates underweight status. Pt's diet advanced from NPO to FLD at 1311 today. Pt had consumed 50% of jello, 100% of potato soup, and 100% of Ensure prior to RD visit. Pt denies abdominal discomfort or nausea following meal but states she is feeling full. Family member and private CNA at bedside and provide all information. Pt eats very small, frequent meals at home. She does not wear her dentures and, therefore, needs very soft foods and items that are cut into small/fine pieces.  Pt does not assistance with feeding during meal times. Pt was drinking Ensure TID PTA and family reports pt likes chocolate and strawberry flavors and that she does not like vanilla flavor. Family also reports that pt will not drink iced tea and prefers gingerale.   Physical assessment shows moderate fat and severe muscle wasting. Per chart review, she has lost 4 lbs (4% body weight) in the past 10 days which is significant for time frame. Her weight has mainly been stable over the past 1 year (87-92 lbs). Family reports that this is not pt's first GIB.  Unsure if pt was fully meeting needs PTA. Medications reviewed. Labs reviewed.   Diet Order:  Diet full liquid Room service appropriate?: Yes; Fluid consistency:: Thin  Skin:  Reviewed, no issues  Last BM:  2/16  Height:   Ht Readings from Last 1 Encounters:  11/04/15 5' (1.524 m)    Weight:   Wt Readings from Last 1 Encounters:  11/04/15 87 lb 15.4 oz (39.9 kg)    Ideal Body Weight:  45.45 kg (kg)  BMI:  Body mass index is 17.18 kg/(m^2).  Estimated Nutritional Needs:   Kcal:  1030-1230  Protein:  40-50 grams  Fluid:  1.5 L/day  EDUCATION NEEDS:   No education needs identified at this time     Jarome Matin, RD, LDN Inpatient Clinical Dietitian Pager # [phone] After hours/weekend pager # [phone]

## 2015-11-05 NOTE — Plan of Care (Signed)
Problem: Nutrition: Goal: Adequate nutrition will be maintained Outcome: Not Progressing Currently NPO for GIB

## 2015-11-05 NOTE — Progress Notes (Signed)
Spiritual Care Note  Responded to RN's page for Advance Directives assistance.  Per family, pt has completed living will at home and DNR in place, and desires only to complete HCPOA form.  During this encounter [name] was unable to answer questions about whom she chooses as her healthcare agent.    Consulted with RN, who plans to ask MD to assess for and document capacity.  Communicated to pt and family that notary may be unavailable over the weekend and that I will refer to [facility] chaplains for f/u assistance on Monday.  Family verbalized understanding and appreciation, with no other questions at this time.  [place], [place], [facility] pager [phone]

## 2015-11-06 DIAGNOSIS — K625 Hemorrhage of anus and rectum: Secondary | ICD-10-CM

## 2015-11-06 LAB — BASIC METABOLIC PANEL
Anion gap: 9 (ref 5–15)
BUN: 13 mg/dL (ref 6–20)
CHLORIDE: 105 mmol/L (ref 101–111)
CO2: 27 mmol/L (ref 22–32)
CREATININE: 0.84 mg/dL (ref 0.44–1.00)
Calcium: 9.1 mg/dL (ref 8.9–10.3)
GFR calc non Af Amer: 57 mL/min — ABNORMAL LOW (ref >60)
Glucose, Bld: 87 mg/dL (ref 65–99)
POTASSIUM: 3.8 mmol/L (ref 3.5–5.1)
Sodium: 141 mmol/L (ref 135–145)

## 2015-11-06 LAB — CBC
HEMATOCRIT: 34.8 % — AB (ref 36.0–46.0)
HEMOGLOBIN: 10.7 g/dL — AB (ref 12.0–15.0)
MCH: 27.2 pg (ref 26.0–34.0)
MCHC: 30.7 g/dL (ref 30.0–36.0)
MCV: 88.3 fL (ref 78.0–100.0)
Platelets: 266 10*3/uL (ref 150–400)
RBC: 3.94 MIL/uL (ref 3.87–5.11)
RDW: 15.5 % (ref 11.5–15.5)
WBC: 7.5 10*3/uL (ref 4.0–10.5)

## 2015-11-06 NOTE — Progress Notes (Signed)
[facility] PROGRESS NOTE  [name] [id number] DOB: 16-Jul-1917 DOA: 11/03/2015 PCP: Cathlean Cower, MD  Summary 11/04/15: I have seen and examined Ms Bastidas at bedside in the presence of family members and reviewed her chart. She has rectal bleed felt to be due to self limiting diverticular bleed. Less intervention is preferred by family given risk for intervention. Unfortunately, patient still has rectal bleed but Hb remains stable. Will continue to monitor Hb and transfuse prbc if Hb drops below 8g/dl. BP uncontrolled. Will add Hydralazine as needed while patient NPO. Patient non verbal. 11/05/15: Hemoglobin stable, old blood per rectum, bleeding seems to be receding. Patient more with it today. Will advance diet, and continue to monitor. 11/06/15: Hemoglobin stable, no further bleeding. Patient tolerating full liquids. Will advance diet to regular, likely DC home in the next day or 2 if she continues to do well. Plan Rectal bleeding/GI bleed  Hemoglobin stable  Monitor Essential Htn  BP better controlled. Continue Norvasc  Hydralzine prn for sbp>144mmHg Code Status: DNR Family Communication: Daughters at bedside on 11/05/2015 Disposition Plan: Discharge home in the next day or 2 if continues to do well   Consultants:  None  Procedures:  None  Antibiotics:  None  HPI/Subjective: Has no complaints.  Objective: Filed Vitals:   11/06/15 0600 11/06/15 1300  BP: 155/92 145/91  Pulse: 74 85  Temp: 98.1 F (36.7 C) 97.9 F (36.6 C)  Resp: 20 16    Intake/Output Summary (Last 24 hours) at 11/06/15 1951 Last data filed at 11/06/15 1831  Gross per 24 hour  Intake    696 ml  Output      0 ml  Net    696 ml   Filed Weights   11/04/15 0034 11/05/15 1628 11/06/15 1841  Weight: 39.9 kg (87 lb 15.4 oz) 74.39 kg (164 lb) 45 kg (99 lb 3.3 oz)    Exam:   General:  Comfortable at rest.  Cardiovascular: S1-S2 normal. No murmurs. Pulse regular.  Respiratory:  Good air entry bilaterally. No rhonchi or rales.  Abdomen: Soft and nontender. Normal bowel sounds. No organomegaly.  Musculoskeletal: No pedal edema   Neurological: Pleasantly confused  Data Reviewed: Basic Metabolic Panel:  Recent Labs Lab 11/03/15 2207 11/05/15 0639 11/06/15 0353  NA 141 140 141  K 4.2 4.2 3.8  CL 103 105 105  CO2 28 26 27   GLUCOSE 129* 89 87  BUN 20 14 13   CREATININE 1.07* 0.86 0.84  CALCIUM 9.2 9.0 9.1   Liver Function Tests:  Recent Labs Lab 11/05/15 0639  AST 24  ALT 9*  ALKPHOS 67  BILITOT 0.9  PROT 6.6  ALBUMIN 3.4*   No results for input(s): LIPASE, AMYLASE in the last 168 hours. No results for input(s): AMMONIA in the last 168 hours. CBC:  Recent Labs Lab 11/03/15 2207  11/04/15 1855 11/05/15 0016 11/05/15 0639 11/05/15 1231 11/06/15 0353  WBC 6.2  --   --   --  4.8  --  7.5  NEUTROABS  --   --   --   --  3.1  --   --   HGB 10.2*  < > 10.1* 9.8* 9.8* 10.6* 10.7*  HCT 32.4*  < > 31.3* 31.1* 31.0* 33.5* 34.8*  MCV 88.0  --   --   --  88.6  --  88.3  PLT 261  --   --   --  219  --  266  < > = values in this  interval not displayed. Cardiac Enzymes: No results for input(s): CKTOTAL, CKMB, CKMBINDEX, TROPONINI in the last 168 hours. BNP (last 3 results) No results for input(s): BNP in the last 8760 hours.  ProBNP (last 3 results) No results for input(s): PROBNP in the last 8760 hours.  CBG: No results for input(s): GLUCAP in the last 168 hours.  No results found for this or any previous visit (from the past 240 hour(s)).   Studies: No results found.  Scheduled Meds: . sodium chloride   Intravenous Once  . amLODipine  5 mg Oral Daily  . calcium-vitamin D  1 tablet Oral Daily  . donepezil  5 mg Oral QHS  . feeding supplement (ENSURE ENLIVE)  237 mL Oral TID BM  . isosorbide mononitrate  30 mg Oral Daily  . levothyroxine  88 mcg Oral QAC breakfast  . nystatin  500,000 Units Oral QID  . pantoprazole (PROTONIX) IV  40  mg Intravenous QHS  . polyethylene glycol  17 g Oral Daily  . sertraline  25 mg Oral Daily  . sucralfate  1 g Oral 4 times per day  . cyanocobalamin  1,000 mcg Oral Daily   Continuous Infusions: . sodium chloride 50 mL/hr at 11/06/15 1205     Time spent: 25 minutes    Niam Nepomuceno  [facility] Pager [phone]. If 7PM-7AM, please contact night-coverage at [email or web address], password [facility] 11/06/2015, 7:51 PM  LOS: 1 day

## 2015-11-07 LAB — MAGNESIUM: Magnesium: 1.3 mg/dL — ABNORMAL LOW (ref 1.7–2.4)

## 2015-11-07 LAB — CBC WITH DIFFERENTIAL/PLATELET
Basophils Absolute: 0 10*3/uL (ref 0.0–0.1)
Basophils Relative: 0 %
EOS ABS: 0.2 10*3/uL (ref 0.0–0.7)
EOS PCT: 3 %
HCT: 29.1 % — ABNORMAL LOW (ref 36.0–46.0)
Hemoglobin: 9.4 g/dL — ABNORMAL LOW (ref 12.0–15.0)
LYMPHS ABS: 1.7 10*3/uL (ref 0.7–4.0)
LYMPHS PCT: 26 %
MCH: 27.8 pg (ref 26.0–34.0)
MCHC: 32.3 g/dL (ref 30.0–36.0)
MCV: 86.1 fL (ref 78.0–100.0)
MONO ABS: 0.5 10*3/uL (ref 0.1–1.0)
MONOS PCT: 7 %
Neutro Abs: 4.2 10*3/uL (ref 1.7–7.7)
Neutrophils Relative %: 64 %
PLATELETS: 201 10*3/uL (ref 150–400)
RBC: 3.38 MIL/uL — AB (ref 3.87–5.11)
RDW: 15.4 % (ref 11.5–15.5)
WBC: 6.5 10*3/uL (ref 4.0–10.5)

## 2015-11-07 LAB — COMPREHENSIVE METABOLIC PANEL
ALT: 8 U/L — AB (ref 14–54)
ANION GAP: 7 (ref 5–15)
AST: 17 U/L (ref 15–41)
Albumin: 2.9 g/dL — ABNORMAL LOW (ref 3.5–5.0)
Alkaline Phosphatase: 59 U/L (ref 38–126)
BUN: 11 mg/dL (ref 6–20)
CALCIUM: 8.6 mg/dL — AB (ref 8.9–10.3)
CHLORIDE: 106 mmol/L (ref 101–111)
CO2: 26 mmol/L (ref 22–32)
CREATININE: 0.77 mg/dL (ref 0.44–1.00)
Glucose, Bld: 85 mg/dL (ref 65–99)
Potassium: 3.4 mmol/L — ABNORMAL LOW (ref 3.5–5.1)
Sodium: 139 mmol/L (ref 135–145)
Total Bilirubin: 0.8 mg/dL (ref 0.3–1.2)
Total Protein: 5.7 g/dL — ABNORMAL LOW (ref 6.5–8.1)

## 2015-11-07 LAB — TYPE AND SCREEN
ABO/RH(D): O POS
ANTIBODY SCREEN: NEGATIVE
UNIT DIVISION: 0
Unit division: 0

## 2015-11-07 LAB — PHOSPHORUS: Phosphorus: 2.2 mg/dL — ABNORMAL LOW (ref 2.5–4.6)

## 2015-11-07 MED ORDER — POTASSIUM PHOSPHATES 15 MMOLE/5ML IV SOLN
10.0000 mmol | Freq: Once | INTRAVENOUS | Status: AC
  Administered 2015-11-07: 10 mmol via INTRAVENOUS
  Filled 2015-11-07: qty 3.33

## 2015-11-07 MED ORDER — MAGNESIUM SULFATE 2 GM/50ML IV SOLN
2.0000 g | Freq: Once | INTRAVENOUS | Status: AC
  Administered 2015-11-07: 2 g via INTRAVENOUS
  Filled 2015-11-07: qty 50

## 2015-11-07 MED ORDER — POTASSIUM CHLORIDE CRYS ER 20 MEQ PO TBCR
40.0000 meq | EXTENDED_RELEASE_TABLET | Freq: Once | ORAL | Status: AC
  Administered 2015-11-07: 40 meq via ORAL
  Filled 2015-11-07: qty 2

## 2015-11-07 MED ORDER — PANTOPRAZOLE SODIUM 40 MG PO TBEC
40.0000 mg | DELAYED_RELEASE_TABLET | Freq: Every day | ORAL | Status: DC
  Administered 2015-11-07: 40 mg via ORAL
  Filled 2015-11-07: qty 1

## 2015-11-07 MED ORDER — POTASSIUM CHLORIDE 20 MEQ/15ML (10%) PO SOLN
40.0000 meq | Freq: Once | ORAL | Status: DC
  Filled 2015-11-07: qty 30

## 2015-11-07 NOTE — Progress Notes (Signed)
Key Points: Use following P&T approved IV to PO non-antibiotic change policy.  Description contains the criteria that are approved Note: Policy Excludes:  Esophagectomy patientsPHARMACIST - PHYSICIAN COMMUNICATION DR:   Sanjuana Letters CONCERNING: IV to Oral Route Change Policy  RECOMMENDATION: This patient is receiving protonix by the intravenous route.  Based on criteria approved by the Pharmacy and Therapeutics Committee, the intravenous medication(s) is/are being converted to the equivalent oral dose form(s).   DESCRIPTION: These criteria include:  The patient is eating (either orally or via tube) and/or has been taking other orally administered medications for a least 24 hours  The patient has no evidence of active gastrointestinal bleeding or impaired GI absorption (gastrectomy, short bowel, patient on TNA or NPO).  If you have questions about this conversion, please contact the Pharmacy Department  []   [phone] )  [facility] []   [phone] )  [facility]  []   [phone] )  [facility] [x]   [phone] )  [place], [place], [facility] 11/07/2015 12:29 PM

## 2015-11-08 LAB — BASIC METABOLIC PANEL
Anion gap: 8 (ref 5–15)
BUN: 10 mg/dL (ref 6–20)
CHLORIDE: 109 mmol/L (ref 101–111)
CO2: 27 mmol/L (ref 22–32)
CREATININE: 0.75 mg/dL (ref 0.44–1.00)
Calcium: 9.3 mg/dL (ref 8.9–10.3)
Glucose, Bld: 97 mg/dL (ref 65–99)
POTASSIUM: 4 mmol/L (ref 3.5–5.1)
SODIUM: 144 mmol/L (ref 135–145)

## 2015-11-08 LAB — CBC
HCT: 30.5 % — ABNORMAL LOW (ref 36.0–46.0)
HEMOGLOBIN: 9.6 g/dL — AB (ref 12.0–15.0)
MCH: 27.7 pg (ref 26.0–34.0)
MCHC: 31.5 g/dL (ref 30.0–36.0)
MCV: 88.2 fL (ref 78.0–100.0)
PLATELETS: 213 10*3/uL (ref 150–400)
RBC: 3.46 MIL/uL — AB (ref 3.87–5.11)
RDW: 15.6 % — ABNORMAL HIGH (ref 11.5–15.5)
WBC: 6.3 10*3/uL (ref 4.0–10.5)

## 2015-11-08 NOTE — Assessment & Plan Note (Signed)
Chronic dementia with some behavioral disturbance, consider seroquel,  to f/u any worsening symptoms or concerns

## 2015-11-08 NOTE — Progress Notes (Addendum)
Chaplain paged to assist with completion of Advance Directive / Health Care Power of Attorney   Ms. Nottingham was not able to demonstrate orientation to place and time when chaplain was in room.  Chaplain explained to daughter that Ms. Rotenberg was not displaying competency to sign advance directive paperwork.    Provided education around default power of attorney, explaining that Ms. Belisle' closest relative could serve as her health care power of attorney if she is unable to make medical decisions.  Also explained this to another of Ms. Cossairt' daughters via phone.  Both daughters verbalized understanding.   Daughter wonders if pt would be able to complete document when she has moments of lucidity where she is able to understand document and display competency.  Chaplain affirmed that we could assess Ms. Bobadilla again and advised the family to call if they feel she is able to complete document.    Ms. Bering had 11 children.   By report of the daughter present, 7 are living.  This chaplain explained to daughters that often a "consensus of the children" is required, and they may face this hurdle in making future medical decisions for Ms. Broda.    Daughters recalled Ms. Mcguigan completing Advance Directive during and admission in 2012.  Chaplain reviewed chart and found DRN / no code entered as "physician order" at 13:39 on 07/13/2011.  This was signed by Dr. Linda Hedges.  However, did not find HCPOA in chart.    [place], [place]

## 2015-11-08 NOTE — Progress Notes (Deleted)
Minola Piwowarczyk, is a 82 y.o. female  DOB 29-Sep-1916  MRN [id number].  Admission date:  11/03/2015  Admitting Physician  Merton Border, MD  Discharge Date:  11/08/2015   Primary MD  Cathlean Cower, MD  Recommendations for primary care physician for things to follow:  ?Repeat CBC in the next week or so. Consider ferrous sulfate which family wanted to hold off for now.  Admission Diagnosis   Rectal bleeding [K62.5]   Discharge Diagnosis  Rectal bleeding [K62.5]   Active Problems:   Hypothyroidism   Essential hypertension   Rectal bleeding   GI bleed      Hospital Course  Janelli Rittenberg is a pleasant 83 y.o. Female with hypothyroidism/essential hypertension/diverticulosis, who was brought by her daughter after she noticed maroon colored stools in the bathroom, and she was found to have rectal bleeding which was self-limiting therefore felt to be diverticular in origin. Her hemoglobin has remained steady above 9 g/dl. Family was reluctant to have invasive procedures including colonoscopy therefore this was not pursued. She is back to her baseline and will therefore be discharged home to the care of her attentive family. She should follow with her PCP in the next 1-2 weeks.  Discharge Condition Stable.  Consults obtained  None  Follow UP PCP   Discharge Instructions  and  Discharge Medications  Discharge Instructions    Diet - low sodium heart healthy    Complete by:  As directed      Increase activity slowly    Complete by:  As directed             Medication List    TAKE these medications        acetaminophen 500 MG tablet  Commonly known as:  TYLENOL  Take 500 mg by mouth every 6 (six) hours as needed for moderate pain.     CALTRATE 600+D PLUS MINERALS 600-800 MG-UNIT Tabs  Take 1 tablet by mouth  daily.     CARAFATE 1 GM/10ML suspension  Generic drug:  sucralfate  TAKE 10 MLS (1 G TOTAL) BY MOUTH 4 (FOUR) TIMES DAILY - WITH MEALS AND AT BEDTIME.     cyanocobalamin 1000 MCG tablet  Commonly known as:  [employer] VITAMIN B12  Take 1 tablet (1,000 mcg total) by mouth daily.     donepezil 5 MG tablet  Commonly known as:  ARICEPT  Take 1 tablet (5 mg total) by mouth at bedtime.     feeding supplement (ENSURE ENLIVE) Liqd  Take 237 mLs by mouth 3 (three) times daily between meals.     isosorbide mononitrate 30 MG 24 hr tablet  Commonly known as:  IMDUR  Take 1 tablet (30 mg total) by mouth daily.     levothyroxine 88 MCG tablet  Commonly known as:  SYNTHROID, LEVOTHROID  TAKE 1 TABLET (88 MCG TOTAL) BY MOUTH DAILY BEFORE BREAKFAST.     lisinopril 10 MG tablet  Commonly known as:  PRINIVIL,ZESTRIL  TAKE 1 TABLET EVERY DAY  loratadine 10 MG tablet  Commonly known as:  CLARITIN  Take 10 mg by mouth daily as needed for allergies.     nitroGLYCERIN 0.4 MG SL tablet  Commonly known as:  NITROSTAT  Place 1 tablet (0.4 mg total) under the tongue every 5 (five) minutes as needed. Chest pains     nystatin 100000 UNIT/ML suspension  Commonly known as:  MYCOSTATIN  Take 5 mLs (500,000 Units total) by mouth 4 (four) times daily.     polyethylene glycol powder powder  Commonly known as:  MIRALAX  Take 17 g by mouth daily.     polyvinyl alcohol 1.4 % ophthalmic solution  Commonly known as:  LIQUIFILM TEARS  Place 1 drop into both eyes 2 (two) times daily as needed for dry eyes.     sertraline 25 MG tablet  Commonly known as:  ZOLOFT  TAKE 1 TABLET (25 MG TOTAL) BY MOUTH DAILY.     sodium chloride 0.65 % Soln nasal spray  Commonly known as:  OCEAN  Place 1 spray into both nostrils daily as needed for congestion.     traMADol 50 MG tablet  Commonly known as:  ULTRAM  Take 1 tablet (50 mg total) by mouth every 12 (twelve) hours as needed for severe pain.        Diet and  Activity recommendation: See Discharge Instructions above  Major procedures and Radiology Reports - PLEASE review detailed and final reports for all details, in brief -    Dg Chest 2 View  10/23/2015  CLINICAL DATA:  Syncope EXAM: CHEST  2 VIEW COMPARISON:  08/01/2015 FINDINGS: Heart size is enlarged. Uncoiled aorta. Negative for heart failure. Lungs are clear without infiltrate or effusion. No change from the prior study. IMPRESSION: No active cardiopulmonary disease. Electronically Signed   By: Franchot Gallo M.D.   On: 10/23/2015 17:58    Micro Results   No results found for this or any previous visit (from the past 240 hour(s)).     Today   Subjective:   Joselle Bumpas denies any complaints..   Objective:   Blood pressure 145/77, pulse 77, temperature 96.5 F (35.8 C), temperature source Axillary, resp. rate 16, height 5' (1.524 m), weight 45 kg (99 lb 3.3 oz), SpO2 99 %.   Intake/Output Summary (Last 24 hours) at 11/08/15 1457 Last data filed at 11/08/15 1300  Gross per 24 hour  Intake    600 ml  Output      0 ml  Net    600 ml    Exam   Data Review   CBC w Diff: Lab Results  Component Value Date   WBC 6.3 11/08/2015   HGB 9.6* 11/08/2015   HCT 30.5* 11/08/2015   HCT 30.1* 05/11/2015   PLT 213 11/08/2015   LYMPHOPCT 26 11/07/2015   MONOPCT 7 11/07/2015   EOSPCT 3 11/07/2015   BASOPCT 0 11/07/2015    CMP: Lab Results  Component Value Date   NA 144 11/08/2015   K 4.0 11/08/2015   CL 109 11/08/2015   CO2 27 11/08/2015   BUN 10 11/08/2015   CREATININE 0.75 11/08/2015   PROT 5.7* 11/07/2015   ALBUMIN 2.9* 11/07/2015   BILITOT 0.8 11/07/2015   ALKPHOS 59 11/07/2015   AST 17 11/07/2015   ALT 8* 11/07/2015  .   Total Time in preparing paper work, data evaluation and todays exam -  20 minutes  Christinea Brizuela M.D on 11/08/2015 at 2:57 PM  [facility]  Office  [phone]

## 2015-11-08 NOTE — Discharge Summary (Signed)
[name], is a 82 y.o. female  DOB 1917-01-07  MRN [id number].  Admission date:  11/03/2015  Admitting Physician  Merton Border, MD  Discharge Date:  11/08/2015   Primary MD  Cathlean Cower, MD  Recommendations for primary care physician for things to follow:  ?Repeat CBC in the next week or so. Consider ferrous sulfate which family wanted to hold off for now.  Admission Diagnosis  Rectal bleeding [K62.5]   Discharge Diagnosis  Rectal bleeding [K62.5]  Active Problems:  Hypothyroidism  Essential hypertension  Rectal bleeding  GI bleed    Hospital Course  [name] is a pleasant 82 y.o. Female with hypothyroidism/essential hypertension/diverticulosis, who was brought by her daughter after she noticed maroon colored stools in the bathroom, and she was found to have rectal bleeding which was self-limiting therefore felt to be diverticular in origin. Her hemoglobin has remained steady above 9 g/dl. Family was reluctant to have invasive procedures including colonoscopy therefore this was not pursued. She is back to her baseline and will therefore be discharged home to the care of her attentive family. She should follow with her PCP in the next 1-2 weeks.  Discharge Condition Stable.  Consults obtained  None  Follow UP PCP     Discharge Instructions  and  Discharge Medications  Discharge Instructions    Diet - low sodium heart healthy    Complete by:  As directed      Increase activity slowly    Complete by:  As directed             Medication List    TAKE these medications        acetaminophen 500 MG tablet  Commonly known as:  TYLENOL  Take 500 mg by mouth every 6 (six) hours as needed for moderate pain.     CALTRATE 600+D PLUS MINERALS 600-800 MG-UNIT Tabs  Take 1 tablet by  mouth daily.     CARAFATE 1 GM/10ML suspension  Generic drug:  sucralfate  TAKE 10 MLS (1 G TOTAL) BY MOUTH 4 (FOUR) TIMES DAILY - WITH MEALS AND AT BEDTIME.     cyanocobalamin 1000 MCG tablet  Commonly known as:  CVS VITAMIN B12  Take 1 tablet (1,000 mcg total) by mouth daily.     donepezil 5 MG tablet  Commonly known as:  ARICEPT  Take 1 tablet (5 mg total) by mouth at bedtime.     feeding supplement (ENSURE ENLIVE) Liqd  Take 237 mLs by mouth 3 (three) times daily between meals.     isosorbide mononitrate 30 MG 24 hr tablet  Commonly known as:  IMDUR  Take 1 tablet (30 mg total) by mouth daily.     levothyroxine 88 MCG tablet  Commonly known as:  SYNTHROID, LEVOTHROID  TAKE 1 TABLET (88 MCG TOTAL) BY MOUTH DAILY BEFORE BREAKFAST.     lisinopril 10 MG tablet  Commonly known as:  PRINIVIL,ZESTRIL  TAKE 1 TABLET EVERY DAY     loratadine 10 MG tablet  Commonly  known as:  CLARITIN  Take 10 mg by mouth daily as needed for allergies.     nitroGLYCERIN 0.4 MG SL tablet  Commonly known as:  NITROSTAT  Place 1 tablet (0.4 mg total) under the tongue every 5 (five) minutes as needed. Chest pains     nystatin 100000 UNIT/ML suspension  Commonly known as:  MYCOSTATIN  Take 5 mLs (500,000 Units total) by mouth 4 (four) times daily.     polyethylene glycol powder powder  Commonly known as:  MIRALAX  Take 17 g by mouth daily.     polyvinyl alcohol 1.4 % ophthalmic solution  Commonly known as:  LIQUIFILM TEARS  Place 1 drop into both eyes 2 (two) times daily as needed for dry eyes.     sertraline 25 MG tablet  Commonly known as:  ZOLOFT  TAKE 1 TABLET (25 MG TOTAL) BY MOUTH DAILY.     sodium chloride 0.65 % Soln nasal spray  Commonly known as:  OCEAN  Place 1 spray into both nostrils daily as needed for congestion.     traMADol 50 MG tablet  Commonly known as:  ULTRAM  Take 1 tablet (50 mg total) by mouth every 12 (twelve) hours as needed for severe pain.        Diet  and Activity recommendation: See Discharge Instructions above  Major procedures and Radiology Reports - PLEASE review detailed and final reports for all details, in brief -    Dg Chest 2 View  10/23/2015  CLINICAL DATA:  Syncope EXAM: CHEST  2 VIEW COMPARISON:  08/01/2015 FINDINGS: Heart size is enlarged. Uncoiled aorta. Negative for heart failure. Lungs are clear without infiltrate or effusion. No change from the prior study. IMPRESSION: No active cardiopulmonary disease. Electronically Signed   By: Franchot Gallo M.D.   On: 10/23/2015 17:58    Micro Results   No results found for this or any previous visit (from the past 240 hour(s)).     Today   Subjective:   [name] denies any complaints..   Objective:   Blood pressure 145/77, pulse 77, temperature 96.5 F (35.8 C), temperature source Axillary, resp. rate 16, height 5' (1.524 m), weight 45 kg (99 lb 3.3 oz), SpO2 99 %.   Intake/Output Summary (Last 24 hours) at 11/08/15 2011 Last data filed at 11/08/15 1300  Gross per 24 hour  Intake    360 ml  Output      0 ml  Net    360 ml    Exam   Data Review   CBC w Diff: Lab Results  Component Value Date   WBC 6.3 11/08/2015   HGB 9.6* 11/08/2015   HCT 30.5* 11/08/2015   HCT 30.1* 05/11/2015   PLT 213 11/08/2015   LYMPHOPCT 26 11/07/2015   MONOPCT 7 11/07/2015   EOSPCT 3 11/07/2015   BASOPCT 0 11/07/2015    CMP: Lab Results  Component Value Date   NA 144 11/08/2015   K 4.0 11/08/2015   CL 109 11/08/2015   CO2 27 11/08/2015   BUN 10 11/08/2015   CREATININE 0.75 11/08/2015   PROT 5.7* 11/07/2015   ALBUMIN 2.9* 11/07/2015   BILITOT 0.8 11/07/2015   ALKPHOS 59 11/07/2015   AST 17 11/07/2015   ALT 8* 11/07/2015  .   Total Time in preparing paper work, data evaluation and todays exam -  20 minutes  Sienna Stonehocker M.D on 11/08/2015 at [place]  [phone]

## 2015-11-08 NOTE — Assessment & Plan Note (Signed)
Etiology unclear, ? Vagal, no recurrence, ok to cont to monitor for now, cont same tx

## 2015-11-08 NOTE — Assessment & Plan Note (Signed)
Mild elevated, likely situational, o/w stable overall by history and exam, recent data reviewed with pt, and pt to continue medical treatment as before,  to f/u any worsening symptoms or concerns BP Readings from Last 3 Encounters:  11/08/15 145/77  11/03/15 100/76  10/25/15 158/95

## 2015-11-08 NOTE — Care Management Important Message (Signed)
Important Message  Patient Details  Name: [name] MRN: [id number] Date of Birth: Feb 08, 1917   Medicare Important Message Given:  Yes    Camillo Flaming 11/08/2015, 2:39 [place] Message  Patient Details  Name: [name] MRN: [id number] Date of Birth: Jul 14, 1917   Medicare Important Message Given:  Yes    Camillo Flaming 11/08/2015, 2:39 PM

## 2015-11-08 NOTE — Progress Notes (Signed)
[facility] PROGRESS NOTE  [name] [id number] DOB: 1917/07/31 DOA: 11/03/2015 PCP: Cathlean Cower, MD  Summary 11/04/15: I have seen and examined [name] at bedside in the presence of family members and reviewed her chart. She has rectal bleed felt to be due to self limiting diverticular bleed. Less intervention is preferred by family given risk for intervention. Unfortunately, patient still has rectal bleed but Hb remains stable. Will continue to monitor Hb and transfuse prbc if Hb drops below 8g/dl. BP uncontrolled. Will add Hydralazine as needed while patient NPO. Patient non verbal. 11/05/15: Hemoglobin stable, old blood per rectum, bleeding seems to be receding. Patient more with it today. Will advance diet, and continue to monitor. 11/06/15: Hemoglobin stable, no further bleeding. Patient tolerating full liquids. Will advance diet to regular, likely DC home in the next day or 2 if she continues to do well. 11/07/15: No complaints. Electrolytes off, likely gi loss. Replenish. Hb stable. ? D/c home tomorrow Plan Rectal bleeding/GI bleed  Hemoglobin stable  Monitor Hypokalemia  Gi loss  Replenish Essential Htn  BP better controlled. Continue Norvasc  Hydralzine prn for sbp>181mmHg Code Status: DNR Family Communication: Daughters at bedside on 11/05/2015 Disposition Plan: Discharge hometomorrow if continues to do well   Consultants:  None  Procedures:  None  Antibiotics:  None  HPI/Subjective: Ok, no complaints.  Objective: Filed Vitals:   11/07/15 1335 11/07/15 2324  BP: 136/79 150/93  Pulse: 77 89  Temp: 98.3 F (36.8 C) 98.3 F (36.8 C)  Resp: 18 16    Intake/Output Summary (Last 24 hours) at 11/08/15 0007 Last data filed at 11/07/15 1700  Gross per 24 hour  Intake    805 ml  Output      0 ml  Net    805 ml   Filed Weights   11/04/15 0034 11/05/15 1628 11/06/15 1841  Weight: 39.9 kg (87 lb 15.4 oz) 74.39 kg (164 lb) 45 kg (99 lb 3.3 oz)     Exam:   General:  Comfortable at rest.  Cardiovascular: S1-S2 normal. No murmurs. Pulse regular.  Respiratory: Good air entry bilaterally. No rhonchi or rales.  Abdomen: Soft and nontender. Normal bowel sounds. No organomegaly.  Musculoskeletal: No pedal edema   Neurological: Intact  Data Reviewed: Basic Metabolic Panel:  Recent Labs Lab 11/03/15 2207 11/05/15 0639 11/06/15 0353 11/07/15 0459  NA 141 140 141 139  K 4.2 4.2 3.8 3.4*  CL 103 105 105 106  CO2 28 26 27 26   GLUCOSE 129* 89 87 85  BUN 20 14 13 11   CREATININE 1.07* 0.86 0.84 0.77  CALCIUM 9.2 9.0 9.1 8.6*  MG  --   --   --  1.3*  PHOS  --   --   --  2.2*   Liver Function Tests:  Recent Labs Lab 11/05/15 0639 11/07/15 0459  AST 24 17  ALT 9* 8*  ALKPHOS 67 59  BILITOT 0.9 0.8  PROT 6.6 5.7*  ALBUMIN 3.4* 2.9*   No results for input(s): LIPASE, AMYLASE in the last 168 hours. No results for input(s): AMMONIA in the last 168 hours. CBC:  Recent Labs Lab 11/03/15 2207  11/05/15 0016 11/05/15 0639 11/05/15 1231 11/06/15 0353 11/07/15 0459  WBC 6.2  --   --  4.8  --  7.5 6.5  NEUTROABS  --   --   --  3.1  --   --  4.2  HGB 10.2*  < > 9.8* 9.8* 10.6* 10.7* 9.4*  HCT 32.4*  < > 31.1* 31.0* 33.5* 34.8* 29.1*  MCV 88.0  --   --  88.6  --  88.3 86.1  PLT 261  --   --  219  --  266 201  < > = values in this interval not displayed. Cardiac Enzymes: No results for input(s): CKTOTAL, CKMB, CKMBINDEX, TROPONINI in the last 168 hours. BNP (last 3 results) No results for input(s): BNP in the last 8760 hours.  ProBNP (last 3 results) No results for input(s): PROBNP in the last 8760 hours.  CBG: No results for input(s): GLUCAP in the last 168 hours.  No results found for this or any previous visit (from the past 240 hour(s)).   Studies: No results found.  Scheduled Meds: . sodium chloride   Intravenous Once  . amLODipine  5 mg Oral Daily  . calcium-vitamin D  1 tablet Oral Daily  .  donepezil  5 mg Oral QHS  . feeding supplement (ENSURE ENLIVE)  237 mL Oral TID BM  . isosorbide mononitrate  30 mg Oral Daily  . levothyroxine  88 mcg Oral QAC breakfast  . nystatin  500,000 Units Oral QID  . pantoprazole  40 mg Oral QHS  . polyethylene glycol  17 g Oral Daily  . sertraline  25 mg Oral Daily  . sucralfate  1 g Oral 4 times per day  . cyanocobalamin  1,000 mcg Oral Daily   Continuous Infusions:    Time spent: 25 minutes    [name]  [facility] Pager [phone]. If 7PM-7AM, please contact night-coverage at [email or web address], password [email or web address] 11/08/2015, 12:07 AM  LOS: 3 days

## 2015-11-10 ENCOUNTER — Telehealth: Payer: Self-pay | Admitting: *Deleted

## 2015-11-10 NOTE — Telephone Encounter (Signed)
Called daughter (Mrs. Irish Elders) to make TCM appt completed call below.../lmb  Transition Care Management Follow-up Telephone Call   Date discharged? 11/08/15   How have you been since you were released from the hospital? Pt daughter states she is doing ok   Do you understand why you were in the hospital? YES   Do you understand the discharge instructions? YES   Where were you discharged to? Home   Items Reviewed:  Medications reviewed: YES  Allergies reviewed: YES  Dietary changes reviewed: NO  Referrals reviewed: No referral needed   Functional Questionnaire:   Activities of Daily Living (ADLs):   She states mom are independent in the following: ambulation, feeding, continence, grooming, toileting and dressing States she require assistance with the following: ambulation and bathing and hygiene   Any transportation issues/concerns?: NO   Any patient concerns? YES   Confirmed importance and date/time of follow-up visits scheduled YES, appt 11/22/15  Provider Appointment booked with Dr. Cathlean Cower  Confirmed with patient if condition begins to worsen call PCP or go to the ER.  Patient was given the office number and encouraged to call back with question or concerns.  : YES

## 2015-11-18 DIAGNOSIS — F0391 Unspecified dementia with behavioral disturbance: Secondary | ICD-10-CM | POA: Diagnosis not present

## 2015-11-22 ENCOUNTER — Encounter: Payer: Self-pay | Admitting: Internal Medicine

## 2015-11-22 ENCOUNTER — Ambulatory Visit (INDEPENDENT_AMBULATORY_CARE_PROVIDER_SITE_OTHER): Payer: Commercial Managed Care - HMO | Admitting: Internal Medicine

## 2015-11-22 ENCOUNTER — Other Ambulatory Visit (INDEPENDENT_AMBULATORY_CARE_PROVIDER_SITE_OTHER): Payer: Commercial Managed Care - HMO

## 2015-11-22 VITALS — BP 110/60 | HR 84 | Temp 97.6°F | Ht 60.0 in | Wt 89.0 lb

## 2015-11-22 DIAGNOSIS — D62 Acute posthemorrhagic anemia: Secondary | ICD-10-CM | POA: Diagnosis not present

## 2015-11-22 DIAGNOSIS — B370 Candidal stomatitis: Secondary | ICD-10-CM

## 2015-11-22 DIAGNOSIS — J3089 Other allergic rhinitis: Secondary | ICD-10-CM | POA: Diagnosis not present

## 2015-11-22 DIAGNOSIS — Z5189 Encounter for other specified aftercare: Secondary | ICD-10-CM

## 2015-11-22 DIAGNOSIS — I10 Essential (primary) hypertension: Secondary | ICD-10-CM

## 2015-11-22 DIAGNOSIS — K625 Hemorrhage of anus and rectum: Secondary | ICD-10-CM

## 2015-11-22 LAB — CBC WITH DIFFERENTIAL/PLATELET
Basophils Absolute: 0.1 10*3/uL (ref 0.0–0.1)
Basophils Relative: 0.9 % (ref 0.0–3.0)
EOS PCT: 0.4 % (ref 0.0–5.0)
Eosinophils Absolute: 0 10*3/uL (ref 0.0–0.7)
HEMATOCRIT: 29.1 % — AB (ref 36.0–46.0)
HEMOGLOBIN: 9.4 g/dL — AB (ref 12.0–15.0)
LYMPHS ABS: 1 10*3/uL (ref 0.7–4.0)
Lymphocytes Relative: 12 % (ref 12.0–46.0)
MCHC: 32.1 g/dL (ref 30.0–36.0)
MCV: 83.8 fl (ref 78.0–100.0)
MONOS PCT: 2.8 % — AB (ref 3.0–12.0)
Monocytes Absolute: 0.2 10*3/uL (ref 0.1–1.0)
NEUTROS PCT: 83.9 % — AB (ref 43.0–77.0)
Neutro Abs: 7.3 10*3/uL (ref 1.4–7.7)
Platelets: 368 10*3/uL (ref 150.0–400.0)
RBC: 3.48 Mil/uL — AB (ref 3.87–5.11)
RDW: 16.5 % — ABNORMAL HIGH (ref 11.5–15.5)
WBC: 8.7 10*3/uL (ref 4.0–10.5)

## 2015-11-22 MED ORDER — CALTRATE 600+D PLUS MINERALS 600-800 MG-UNIT PO TABS: 1.0000 | ORAL_TABLET | Freq: Every day | ORAL | Status: AC

## 2015-11-22 NOTE — Patient Instructions (Signed)
OK to take the nystatin solution that you have at home  OK to re-start the Claritin 10 mg daily for the allergies  Please continue all other medications as before, and refills have been done if requested.  Please have the pharmacy call with any other refills you may need.  Please keep your appointments with your specialists as you may have planned  Please go to the LAB in the Basement (turn left off the elevator) for the tests to be done today  You will be contacted by phone if any changes need to be made immediately.  Otherwise, you will receive a letter about your results with an explanation, but please check with MyChart first.  Please remember to sign up for MyChart if you have not done so, as this will be important to you in the future with finding out test results, communicating by private email, and scheduling acute appointments online when needed.  Please return in 4 months, or sooner if needed

## 2015-11-22 NOTE — Assessment & Plan Note (Signed)
No further bleeding per daughter, for f/u cbc today,  to f/u any worsening symptoms or concerns

## 2015-11-22 NOTE — Progress Notes (Signed)
Subjective:    Patient ID: [name], female    DOB: 07/06/1917, 82 y.o.   MRN: [id number]  HPI  Here to f/u with daughter to give hx, with recent hospn 2/15 -2/20 with rectal bleeding onset just after seen earlier in the day here;  This was c/w likely diverticular bleeding, no aggressive intervention or colonoscopy done, fortunately pt stopped bleeding, and hgb stabilized at approx 9.  Since home has overall been doing ok.  Since home pt has been stable without overt bleeding and no other new complaints.  Denies worsening reflux, abd pain, dysphagia, n/v, bowel change or blood.  Pt denies chest pain, increased sob or doe, wheezing, orthopnea, PND, increased LE swelling, palpitations, dizziness or syncope.  Daughter believes about the best activity level as she will get, not likely to improve further with repeat PT. No recent falls.  Dementia overall stable symptomatically, and not assoc with behavioral changes such as hallucinations, paranoia, or agitation.  Only new complaint is Does have several wks ongoing nasal allergy symptoms with clearish congestion, itch and sneezing, without fever, pain, ST, cough, swelling or wheezing, not yet re-started her claritin she takes seasonally  Thrush to tongue has improved with recent tx, but still some erythema, wants to know if needs to cont the nystatin. Past Medical History  Diagnosis Date  . Internal hemorrhoid 07/2012  . Diverticulosis of colon (without mention of hemorrhage) 07/2012  . Herpes zoster   . Glaucoma   . Lumbar back pain   . Hypertension   . Cardiomyopathy   . Hypothyroid   . Myocardial infarction ([place])   . History of blood transfusion     "related to diverticulitis" (12/02/2013)  . GERD (gastroesophageal reflux disease)   . Depression   . Anxiety   . Hyponatremia 07/2012    Na as low as 114.   . Protein calorie malnutrition ([place]) 07/2012    BMI then: 16.5  . Zenker diverticulum 07/2012    this and esophageal dysmotility on  esophagram.   . Psychosis   . Behavior disturbance   . Diverticulitis   . Colon cancer [facility])     daughter denis colon cancer only diverticulitis  . Arthritis     "joints" (12/02/2013)   Past Surgical History  Procedure Laterality Date  . Thyroidectomy    . Appendectomy  2005    ruptured appendix  . Tubal ligation    . Colonoscopy  07/19/2012    Procedure: COLONOSCOPY;  Surgeon: Ladene Artist, MD,FACG;  Location: [facility] ENDOSCOPY;  Service: Endoscopy;  Laterality: N/A;  . Cataract extraction w/ intraocular lens  implant, bilateral Bilateral   . Glaucoma surgery Right     reports that she has never smoked. She has never used smokeless tobacco. She reports that she does not drink alcohol or use illicit drugs. family history includes Dementia in her sister; Stroke in her daughter. There is no history of Colon cancer. No Known Allergies Current Outpatient Prescriptions on File Prior to Visit  Medication Sig Dispense Refill  . acetaminophen (TYLENOL) 500 MG tablet Take 500 mg by mouth every 6 (six) hours as needed for moderate pain.    Marland Kitchen CARAFATE 1 GM/10ML suspension TAKE 10 MLS (1 G TOTAL) BY MOUTH 4 (FOUR) TIMES DAILY - WITH MEALS AND AT BEDTIME. 420 mL 3  . cyanocobalamin ([employer] VITAMIN B12) 1000 MCG tablet Take 1 tablet (1,000 mcg total) by mouth daily. 30 tablet 11  . donepezil (ARICEPT) 5 MG tablet Take 1  tablet (5 mg total) by mouth at bedtime. 30 tablet 0  . feeding supplement, ENSURE ENLIVE, (ENSURE ENLIVE) LIQD Take 237 mLs by mouth 3 (three) times daily between meals. 237 mL 12  . isosorbide mononitrate (IMDUR) 30 MG 24 hr tablet Take 1 tablet (30 mg total) by mouth daily. 30 tablet 11  . levothyroxine (SYNTHROID, LEVOTHROID) 88 MCG tablet TAKE 1 TABLET (88 MCG TOTAL) BY MOUTH DAILY BEFORE BREAKFAST. 30 tablet 11  . lisinopril (PRINIVIL,ZESTRIL) 10 MG tablet TAKE 1 TABLET EVERY DAY (Patient taking differently: TAKE 1 TABLET BY MOUTH EVERY DAY) 30 tablet 11  . loratadine (CLARITIN) 10  MG tablet Take 10 mg by mouth daily as needed for allergies.    . nitroGLYCERIN (NITROSTAT) 0.4 MG SL tablet Place 1 tablet (0.4 mg total) under the tongue every 5 (five) minutes as needed. Chest pains 30 tablet 6  . nystatin (MYCOSTATIN) 100000 UNIT/ML suspension Take 5 mLs (500,000 Units total) by mouth 4 (four) times daily. 60 mL 0  . polyethylene glycol powder (MIRALAX) powder Take 17 g by mouth daily. (Patient taking differently: Take 17 g by mouth 3 (three) times a week. Mon, Wed, Fri.) 850 g 0  . polyvinyl alcohol (LIQUIFILM TEARS) 1.4 % ophthalmic solution Place 1 drop into both eyes 2 (two) times daily as needed for dry eyes.    Marland Kitchen sertraline (ZOLOFT) 25 MG tablet TAKE 1 TABLET (25 MG TOTAL) BY MOUTH DAILY. 30 tablet 5  . sodium chloride (OCEAN) 0.65 % SOLN nasal spray Place 1 spray into both nostrils daily as needed for congestion.    . traMADol (ULTRAM) 50 MG tablet Take 1 tablet (50 mg total) by mouth every 12 (twelve) hours as needed for severe pain. 6 tablet 0  . [DISCONTINUED] isosorbide dinitrate (ISORDIL) 30 MG tablet Take 30 mg by mouth 3 (three) times daily.      . [DISCONTINUED] ranitidine (ZANTAC) 150 MG tablet Take 150 mg by mouth 2 (two) times daily.       No current facility-administered medications on file prior to visit.   Review of Systems  Constitutional: Negative for unusual diaphoresis or night sweats HENT: Negative for ringing in ear or discharge Eyes: Negative for double vision or worsening visual disturbance.  Respiratory: Negative for choking and stridor.   Gastrointestinal: Negative for vomiting or other signifcant bowel change Genitourinary: Negative for hematuria or change in urine volume.  Musculoskeletal: Negative for other MSK pain or swelling Skin: Negative for color change and worsening wound.  Neurological: Negative for tremors and numbness other than noted  Psychiatric/Behavioral: Negative for decreased concentration or agitation other than above        Objective:   Physical Exam BP 110/60 mmHg  Pulse 84  Temp(Src) 97.6 F (36.4 C) (Oral)  Ht 5' (1.524 m)  Wt 89 lb (40.37 kg)  BMI 17.38 kg/m2  SpO2 94% VS noted,  Constitutional: Pt appears in no significant distress HENT: Head: NCAT.  Right Ear: External ear normal.  Left Ear: External ear normal.  Tongue with geographic areas of white and red patches, non tender, no ulcerations or swelling Bilat tm's with mild erythema.  Max sinus areas mild tender.  Pharynx with mild erythema, no exudate Eyes: . Pupils are equal, round, and reactive to light. Conjunctivae and EOM are normal Neck: Normal range of motion. Neck supple.  Cardiovascular: Normal rate and regular rhythm.   Pulmonary/Chest: Effort normal and breath sounds without rales or wheezing.  Abd:  Soft, NT, ND, +  BS Neurological: Pt is alert. Not confused , motor grossly intact Skin: Skin is warm. No rash, no LE edema Psychiatric: Pt behavior is normal. No agitation.     Assessment & Plan:

## 2015-11-22 NOTE — Assessment & Plan Note (Signed)
[place] for re-start nystatin otc prn,  to f/u any worsening symptoms or concerns

## 2015-11-22 NOTE — Assessment & Plan Note (Signed)
stable overall by history and exam, recent data reviewed with pt, and pt to continue medical treatment as before,  to f/u any worsening symptoms or concerns BP Readings from Last 3 Encounters:  11/22/15 110/60  11/08/15 145/77  11/03/15 100/76

## 2015-11-22 NOTE — Assessment & Plan Note (Signed)
Ok to re-start the claritin 10 mg otc daily,  to f/u any worsening symptoms or concerns

## 2015-11-22 NOTE — Progress Notes (Signed)
Pre visit review using our clinic review tool, if applicable. No additional management support is needed unless otherwise documented below in the visit note. 

## 2015-11-26 ENCOUNTER — Other Ambulatory Visit: Payer: Self-pay | Admitting: Internal Medicine

## 2015-12-17 DIAGNOSIS — F0391 Unspecified dementia with behavioral disturbance: Secondary | ICD-10-CM | POA: Diagnosis not present

## 2015-12-20 ENCOUNTER — Other Ambulatory Visit: Payer: Self-pay | Admitting: Internal Medicine

## 2016-01-21 DIAGNOSIS — F0391 Unspecified dementia with behavioral disturbance: Secondary | ICD-10-CM | POA: Diagnosis not present

## 2016-02-08 ENCOUNTER — Encounter: Payer: Self-pay | Admitting: Internal Medicine

## 2016-02-08 ENCOUNTER — Other Ambulatory Visit (INDEPENDENT_AMBULATORY_CARE_PROVIDER_SITE_OTHER): Payer: Commercial Managed Care - HMO

## 2016-02-08 ENCOUNTER — Ambulatory Visit (INDEPENDENT_AMBULATORY_CARE_PROVIDER_SITE_OTHER): Payer: Commercial Managed Care - HMO | Admitting: Internal Medicine

## 2016-02-08 VITALS — BP 140/82 | HR 62 | Resp 20 | Wt 85.0 lb

## 2016-02-08 DIAGNOSIS — R6889 Other general symptoms and signs: Secondary | ICD-10-CM | POA: Diagnosis not present

## 2016-02-08 DIAGNOSIS — H9193 Unspecified hearing loss, bilateral: Secondary | ICD-10-CM | POA: Diagnosis not present

## 2016-02-08 DIAGNOSIS — R300 Dysuria: Secondary | ICD-10-CM

## 2016-02-08 DIAGNOSIS — Z0001 Encounter for general adult medical examination with abnormal findings: Secondary | ICD-10-CM

## 2016-02-08 DIAGNOSIS — F0391 Unspecified dementia with behavioral disturbance: Secondary | ICD-10-CM | POA: Diagnosis not present

## 2016-02-08 DIAGNOSIS — R4182 Altered mental status, unspecified: Secondary | ICD-10-CM

## 2016-02-08 DIAGNOSIS — I10 Essential (primary) hypertension: Secondary | ICD-10-CM

## 2016-02-08 LAB — CBC WITH DIFFERENTIAL/PLATELET
Basophils Absolute: 0 10*3/uL (ref 0.0–0.1)
Basophils Relative: 0.4 % (ref 0.0–3.0)
EOS PCT: 1.1 % (ref 0.0–5.0)
Eosinophils Absolute: 0.1 10*3/uL (ref 0.0–0.7)
HCT: 31.1 % — ABNORMAL LOW (ref 36.0–46.0)
HEMOGLOBIN: 10 g/dL — AB (ref 12.0–15.0)
Lymphocytes Relative: 30.7 % (ref 12.0–46.0)
Lymphs Abs: 1.4 10*3/uL (ref 0.7–4.0)
MCHC: 32 g/dL (ref 30.0–36.0)
MCV: 81.7 fl (ref 78.0–100.0)
MONOS PCT: 9 % (ref 3.0–12.0)
Monocytes Absolute: 0.4 10*3/uL (ref 0.1–1.0)
NEUTROS PCT: 58.8 % (ref 43.0–77.0)
Neutro Abs: 2.6 10*3/uL (ref 1.4–7.7)
PLATELETS: 195 10*3/uL (ref 150.0–400.0)
RBC: 3.81 Mil/uL — AB (ref 3.87–5.11)
RDW: 18.3 % — AB (ref 11.5–15.5)
WBC: 4.5 10*3/uL (ref 4.0–10.5)

## 2016-02-08 LAB — HEPATIC FUNCTION PANEL
ALBUMIN: 3.6 g/dL (ref 3.5–5.2)
ALK PHOS: 61 U/L (ref 39–117)
ALT: 5 U/L (ref 0–35)
AST: 13 U/L (ref 0–37)
BILIRUBIN TOTAL: 0.9 mg/dL (ref 0.2–1.2)
Bilirubin, Direct: 0.2 mg/dL (ref 0.0–0.3)
Total Protein: 6.7 g/dL (ref 6.0–8.3)

## 2016-02-08 LAB — LIPID PANEL
CHOL/HDL RATIO: 4
CHOLESTEROL: 213 mg/dL — AB (ref 0–200)
HDL: 60.2 mg/dL (ref >39.00)
LDL CALC: 139 mg/dL — AB (ref 0–99)
NonHDL: 152.85
Triglycerides: 68 mg/dL (ref 0.0–149.0)
VLDL: 13.6 mg/dL (ref 0.0–40.0)

## 2016-02-08 LAB — BASIC METABOLIC PANEL
BUN: 14 mg/dL (ref 6–23)
CALCIUM: 8.9 mg/dL (ref 8.4–10.5)
CO2: 31 mEq/L (ref 19–32)
Chloride: 103 mEq/L (ref 96–112)
Creatinine, Ser: 0.8 mg/dL (ref 0.40–1.20)
GFR: 85.37 mL/min (ref >60.00)
GLUCOSE: 104 mg/dL — AB (ref 70–99)
Potassium: 3 mEq/L — ABNORMAL LOW (ref 3.5–5.1)
SODIUM: 141 meq/L (ref 135–145)

## 2016-02-08 LAB — TSH: TSH: 0.68 u[IU]/mL (ref 0.35–4.50)

## 2016-02-08 NOTE — Progress Notes (Signed)
Pre visit review using our clinic review tool, if applicable. No additional management support is needed unless otherwise documented below in the visit note. 

## 2016-02-08 NOTE — Progress Notes (Signed)
Subjective:    Patient ID: [name], female    DOB: [birth year], 82 y.o.   MRN: [id number]  HPI Here for wellness and f/u;  Overall doing ok;  Pt denies Chest pain, worsening SOB, DOE, wheezing, orthopnea, PND, worsening LE edema, palpitations, dizziness or syncope.  Pt denies neurological change such as new headache, facial or extremity weakness.  Pt denies polydipsia, polyuria, or low sugar symptoms. Pt states overall good compliance with treatment and medications, good tolerability, and has been trying to follow appropriate diet.  Pt denies worsening depressive symptoms, suicidal ideation or panic. No fever, night sweats, wt loss, loss of appetite, or other constitutional symptoms.  Pt states good ability with ADL's, has low fall risk, home safety reviewed and adequate, no other significant changes in hearing or vision.  Declines pneumonia shots. C/o bilat ears hearing loss, ? Wax impacted again.  Also has 2-3 days incidental  Somewhat more agitated and confused over baseline, asks for UA with labs  Denies urinary symptoms such as dysuria, frequency, urgency, flank pain, hematuria or n/v, fever, chills. Past Medical History  Diagnosis Date  . Internal hemorrhoid 07/2012  . Diverticulosis of colon (without mention of hemorrhage) 07/2012  . Herpes zoster   . Glaucoma   . Lumbar back pain   . Hypertension   . Cardiomyopathy   . Hypothyroid   . Myocardial infarction ([place])   . History of blood transfusion     "related to diverticulitis" (12/02/2013)  . GERD (gastroesophageal reflux disease)   . Depression   . Anxiety   . Hyponatremia 07/2012    Na as low as 114.   . Protein calorie malnutrition ([place]) 07/2012    BMI then: 16.5  . Zenker diverticulum 07/2012    this and esophageal dysmotility on esophagram.   . Psychosis   . Behavior disturbance   . Diverticulitis   . Colon cancer [facility])     daughter denis colon cancer only diverticulitis  . Arthritis     "joints" (12/02/2013)    Past Surgical History  Procedure Laterality Date  . Thyroidectomy    . Appendectomy  2005    ruptured appendix  . Tubal ligation    . Colonoscopy  07/19/2012    Procedure: COLONOSCOPY;  Surgeon: Ladene Artist, MD,FACG;  Location: [facility] END[facility];  Service: Endoscopy;  Laterality: N/A;  . Cataract extraction w/ intraocular lens  implant, bilateral Bilateral   . Glaucoma surgery Right     reports that she has never smoked. She has never used smokeless tobacco. She reports that she does not drink alcohol or use illicit drugs. family history includes Dementia in her sister; Stroke in her daughter. There is no history of Colon cancer. No Known Allergies Current Outpatient Prescriptions on File Prior to Visit  Medication Sig Dispense Refill  . acetaminophen (TYLENOL) 500 MG tablet Take 500 mg by mouth every 6 (six) hours as needed for moderate pain.    . Calcium Carbonate-Vit D-Min (CALTRATE 600+D PLUS MINERALS) 600-800 MG-UNIT TABS Take 1 tablet by mouth daily. 90 tablet 3  . CARAFATE 1 GM/10ML suspension TAKE 10 MLS (1 G TOTAL) BY MOUTH 4 (FOUR) TIMES DAILY - WITH MEALS AND AT BEDTIME. 420 mL 3  . cyanocobalamin ([employer] VITAMIN B12) 1000 MCG tablet Take 1 tablet (1,000 mcg total) by mouth daily. 30 tablet 11  . donepezil (ARICEPT) 5 MG tablet Take 1 tablet (5 mg total) by mouth at bedtime. 30 tablet 0  . feeding supplement, ENSURE  ENLIVE, (ENSURE ENLIVE) LIQD Take 237 mLs by mouth 3 (three) times daily between meals. 237 mL 12  . isosorbide mononitrate (IMDUR) 30 MG 24 hr tablet Take 1 tablet (30 mg total) by mouth daily. 30 tablet 11  . levothyroxine (SYNTHROID, LEVOTHROID) 88 MCG tablet TAKE 1 TABLET (88 MCG TOTAL) BY MOUTH DAILY BEFORE BREAKFAST. 30 tablet 11  . lisinopril (PRINIVIL,ZESTRIL) 10 MG tablet TAKE 1 TABLET EVERY DAY (Patient taking differently: TAKE 1 TABLET BY MOUTH EVERY DAY) 30 tablet 11  . loratadine (CLARITIN) 10 MG tablet Take 10 mg by mouth daily as needed for allergies.     . nitroGLYCERIN (NITROSTAT) 0.4 MG SL tablet Place 1 tablet (0.4 mg total) under the tongue every 5 (five) minutes as needed. Chest pains 30 tablet 6  . nystatin (MYCOSTATIN) 100000 UNIT/ML suspension Take 5 mLs (500,000 Units total) by mouth 4 (four) times daily. 60 mL 0  . polyethylene glycol powder (GLYCOLAX/MIRALAX) powder TAKE 17 G BY MOUTH DAILY. 1054 g 0  . polyvinyl alcohol (LIQUIFILM TEARS) 1.4 % ophthalmic solution Place 1 drop into both eyes 2 (two) times daily as needed for dry eyes.    Marland Kitchen sertraline (ZOLOFT) 25 MG tablet TAKE 1 TABLET (25 MG TOTAL) BY MOUTH DAILY. 30 tablet 5  . sodium chloride (OCEAN) 0.65 % SOLN nasal spray Place 1 spray into both nostrils daily as needed for congestion.    . traMADol (ULTRAM) 50 MG tablet Take 1 tablet (50 mg total) by mouth every 12 (twelve) hours as needed for severe pain. 6 tablet 0  . [DISCONTINUED] isosorbide dinitrate (ISORDIL) 30 MG tablet Take 30 mg by mouth 3 (three) times daily.      . [DISCONTINUED] ranitidine (ZANTAC) 150 MG tablet Take 150 mg by mouth 2 (two) times daily.       No current facility-administered medications on file prior to visit.    Review of Systems Constitutional: Negative for increased diaphoresis, or other activity, appetite or siginficant weight change other than noted HENT: Negative for worsening hearing loss, ear pain, facial swelling, mouth sores and neck stiffness.   Eyes: Negative for other worsening pain, redness or visual disturbance.  Respiratory: Negative for choking or stridor Cardiovascular: Negative for other chest pain and palpitations.  Gastrointestinal: Negative for worsening diarrhea, blood in stool, or abdominal distention Genitourinary: Negative for hematuria, flank pain or change in urine volume.  Musculoskeletal: Negative for myalgias or other joint complaints.  Skin: Negative for other color change and wound or drainage.  Neurological: Negative for syncope and numbness. other than  noted Hematological: Negative for adenopathy. or other swelling Psychiatric/Behavioral: Negative for hallucinations, SI, self-injury, decreased concentration or other worsening agitation.      Objective:   Physical Exam BP 140/82 mmHg  Pulse 62  Resp 20  Wt 85 lb (38.556 kg)  SpO2 95% VS noted,  Constitutional: Pt is oriented to person, place, and time. Appears well-developed and well-nourished, in no significant distress Head: Normocephalic and atraumatic  Eyes: Conjunctivae and EOM are normal. Pupils are equal, round, and reactive to light Right Ear: External ear normal.  Left Ear: External ear normal Nose: Nose normal.  Mouth/Throat: Oropharynx is clear and moist  Neck: Normal range of motion. Neck supple. No JVD present. No tracheal deviation present or significant neck LA or mass Cardiovascular: Normal rate, regular rhythm, normal heart sounds and intact distal pulses.   Pulmonary/Chest: Effort normal and breath sounds without rales or wheezing  Abdominal: Soft. Bowel sounds are normal.  NT. No HSM  Musculoskeletal: Normal range of motion. Exhibits no edema Lymphadenopathy: Has no cervical adenopathy.  Neurological: Pt is alert and oriented to person only Pt has normal reflexes. No cranial nerve deficit. Motor grossly intact, _+confused Skin: Skin is warm and dry. No rash noted or new ulcers Psychiatric:  Has flat mood and affect. Behavior is normal.     Assessment & Plan:

## 2016-02-08 NOTE — Patient Instructions (Addendum)
Your ears were irrigated of wax today  Please continue all other medications as before, and refills have been done if requested.  Please have the pharmacy call with any other refills you may need.  Please continue your efforts at being more active, low cholesterol diet, and weight control.  You are otherwise up to date with prevention measures today.  Please keep your appointments with your specialists as you may have planned  Please go to the LAB in the Basement (turn left off the elevator) for the tests to be done today, includiing the urine testing  You will be contacted by phone if any changes need to be made immediately.  Otherwise, you will receive a letter about your results with an explanation, but please check with MyChart first.  Please remember to sign up for MyChart if you have not done so, as this will be important to you in the future with finding out test results, communicating by private email, and scheduling acute appointments online when needed.  Please return in 6 months, or sooner if needed  OK to cancel appt later this month

## 2016-02-13 ENCOUNTER — Other Ambulatory Visit: Payer: Self-pay | Admitting: Internal Medicine

## 2016-02-13 LAB — CULTURE, URINE COMPREHENSIVE: Colony Count: >=100000

## 2016-02-13 MED ORDER — NITROFURANTOIN MONOHYD MACRO 100 MG PO CAPS: 100.0000 mg | ORAL_CAPSULE | Freq: Two times a day (BID) | ORAL | Status: DC

## 2016-02-14 NOTE — Assessment & Plan Note (Signed)
Improved with bilat ear wax irrigation  to f/u any worsening symptoms or concerns

## 2016-02-14 NOTE — Assessment & Plan Note (Signed)
stable overall by history and exam,, and pt to continue medical treatment as before,  to f/u any worsening symptoms or concerns, no significant behavioral issue

## 2016-02-14 NOTE — Assessment & Plan Note (Signed)

## 2016-02-14 NOTE — Assessment & Plan Note (Signed)
stable overall by history and exam, recent data reviewed with pt, and pt to continue medical treatment as before,  to f/u any worsening symptoms or concerns BP Readings from Last 3 Encounters:  02/08/16 140/82  11/22/15 110/60  11/08/15 145/77

## 2016-02-14 NOTE — Assessment & Plan Note (Addendum)
Mild confused over baseline, acute on chronic, liekly related to recurrent uti,  to f/u any worsening symptoms or concerns   In addition to the time spent performing CPE, I spent an additional 40 minutes face to face,in which greater than 50% of this time was spent in counseling and coordination of care for patient's acute illness as documented.

## 2016-02-15 ENCOUNTER — Encounter: Payer: Self-pay | Admitting: Internal Medicine

## 2016-03-16 ENCOUNTER — Other Ambulatory Visit: Payer: Self-pay | Admitting: Internal Medicine

## 2016-03-17 DIAGNOSIS — F0391 Unspecified dementia with behavioral disturbance: Secondary | ICD-10-CM | POA: Diagnosis not present

## 2016-03-30 ENCOUNTER — Other Ambulatory Visit: Payer: Self-pay | Admitting: *Deleted

## 2016-03-30 MED ORDER — LEVOTHYROXINE SODIUM 88 MCG PO TABS: ORAL_TABLET | ORAL | Status: DC

## 2016-03-30 MED ORDER — LISINOPRIL 10 MG PO TABS: 10.0000 mg | ORAL_TABLET | Freq: Every day | ORAL | Status: AC

## 2016-05-26 ENCOUNTER — Other Ambulatory Visit: Payer: Self-pay | Admitting: Internal Medicine

## 2016-05-26 NOTE — Telephone Encounter (Signed)
rx done erx 

## 2016-06-05 ENCOUNTER — Other Ambulatory Visit: Payer: Self-pay | Admitting: Internal Medicine

## 2016-06-29 DIAGNOSIS — F0391 Unspecified dementia with behavioral disturbance: Secondary | ICD-10-CM | POA: Diagnosis not present

## 2016-07-02 ENCOUNTER — Other Ambulatory Visit: Payer: Self-pay | Admitting: Internal Medicine

## 2016-08-03 ENCOUNTER — Ambulatory Visit: Payer: Self-pay | Admitting: Internal Medicine

## 2016-08-09 ENCOUNTER — Emergency Department (HOSPITAL_COMMUNITY): Payer: Commercial Managed Care - HMO

## 2016-08-09 ENCOUNTER — Inpatient Hospital Stay (HOSPITAL_COMMUNITY)
Admission: EM | Admit: 2016-08-09 | Discharge: 2016-08-15 | DRG: 100 | Disposition: A | Discharge status: Home / Self Care | Payer: Commercial Managed Care - HMO | Attending: Nephrology | Admitting: Nephrology

## 2016-08-09 DIAGNOSIS — Z79899 Other long term (current) drug therapy: Secondary | ICD-10-CM | POA: Diagnosis not present

## 2016-08-09 DIAGNOSIS — I252 Old myocardial infarction: Secondary | ICD-10-CM | POA: Diagnosis not present

## 2016-08-09 DIAGNOSIS — F0391 Unspecified dementia with behavioral disturbance: Secondary | ICD-10-CM | POA: Diagnosis present

## 2016-08-09 DIAGNOSIS — F0390 Unspecified dementia without behavioral disturbance: Secondary | ICD-10-CM | POA: Diagnosis not present

## 2016-08-09 DIAGNOSIS — R0902 Hypoxemia: Secondary | ICD-10-CM | POA: Diagnosis not present

## 2016-08-09 DIAGNOSIS — Z9842 Cataract extraction status, left eye: Secondary | ICD-10-CM | POA: Diagnosis not present

## 2016-08-09 DIAGNOSIS — R05 Cough: Secondary | ICD-10-CM | POA: Diagnosis not present

## 2016-08-09 DIAGNOSIS — G40901 Epilepsy, unspecified, not intractable, with status epilepticus: Principal | ICD-10-CM | POA: Diagnosis present

## 2016-08-09 DIAGNOSIS — Z85038 Personal history of other malignant neoplasm of large intestine: Secondary | ICD-10-CM

## 2016-08-09 DIAGNOSIS — E43 Unspecified severe protein-calorie malnutrition: Secondary | ICD-10-CM | POA: Diagnosis present

## 2016-08-09 DIAGNOSIS — R1030 Lower abdominal pain, unspecified: Secondary | ICD-10-CM | POA: Diagnosis not present

## 2016-08-09 DIAGNOSIS — R4182 Altered mental status, unspecified: Secondary | ICD-10-CM

## 2016-08-09 DIAGNOSIS — Z66 Do not resuscitate: Secondary | ICD-10-CM | POA: Diagnosis present

## 2016-08-09 DIAGNOSIS — E876 Hypokalemia: Secondary | ICD-10-CM | POA: Diagnosis not present

## 2016-08-09 DIAGNOSIS — H409 Unspecified glaucoma: Secondary | ICD-10-CM | POA: Diagnosis present

## 2016-08-09 DIAGNOSIS — D649 Anemia, unspecified: Secondary | ICD-10-CM | POA: Diagnosis present

## 2016-08-09 DIAGNOSIS — N183 Chronic kidney disease, stage 3 (moderate): Secondary | ICD-10-CM | POA: Diagnosis present

## 2016-08-09 DIAGNOSIS — I129 Hypertensive chronic kidney disease with stage 1 through stage 4 chronic kidney disease, or unspecified chronic kidney disease: Secondary | ICD-10-CM | POA: Diagnosis present

## 2016-08-09 DIAGNOSIS — K219 Gastro-esophageal reflux disease without esophagitis: Secondary | ICD-10-CM | POA: Diagnosis present

## 2016-08-09 DIAGNOSIS — E860 Dehydration: Secondary | ICD-10-CM | POA: Diagnosis present

## 2016-08-09 DIAGNOSIS — Z9841 Cataract extraction status, right eye: Secondary | ICD-10-CM | POA: Diagnosis not present

## 2016-08-09 DIAGNOSIS — Z961 Presence of intraocular lens: Secondary | ICD-10-CM | POA: Diagnosis present

## 2016-08-09 DIAGNOSIS — I429 Cardiomyopathy, unspecified: Secondary | ICD-10-CM | POA: Diagnosis present

## 2016-08-09 DIAGNOSIS — I10 Essential (primary) hypertension: Secondary | ICD-10-CM | POA: Diagnosis present

## 2016-08-09 DIAGNOSIS — F03918 Unspecified dementia, unspecified severity, with other behavioral disturbance: Secondary | ICD-10-CM | POA: Diagnosis present

## 2016-08-09 DIAGNOSIS — E039 Hypothyroidism, unspecified: Secondary | ICD-10-CM | POA: Diagnosis present

## 2016-08-09 DIAGNOSIS — G40501 Epileptic seizures related to external causes, not intractable, with status epilepticus: Secondary | ICD-10-CM | POA: Diagnosis not present

## 2016-08-09 DIAGNOSIS — R569 Unspecified convulsions: Secondary | ICD-10-CM

## 2016-08-09 DIAGNOSIS — Z8249 Family history of ischemic heart disease and other diseases of the circulatory system: Secondary | ICD-10-CM

## 2016-08-09 DIAGNOSIS — Z681 Body mass index (BMI) 19 or less, adult: Secondary | ICD-10-CM | POA: Diagnosis not present

## 2016-08-09 DIAGNOSIS — J9601 Acute respiratory failure with hypoxia: Secondary | ICD-10-CM

## 2016-08-09 MED ORDER — SODIUM CHLORIDE 0.9 % IV BOLUS (SEPSIS)
500.0000 mL | Freq: Once | INTRAVENOUS | Status: AC
Start: 2016-08-09 — End: 2016-08-10
  Administered 2016-08-10: 500 mL via INTRAVENOUS

## 2016-08-09 MED ORDER — SODIUM CHLORIDE 0.9 % IV BOLUS (SEPSIS)
1000.0000 mL | Freq: Once | INTRAVENOUS | Status: AC
  Administered 2016-08-10: 1000 mL via INTRAVENOUS

## 2016-08-09 NOTE — ED Provider Notes (Signed)
[facility] DEPT Provider Note   CSN: [id number] Arrival date & time: 08/09/16  2247 By signing my name below, I, Georgette Shell, attest that this documentation has been prepared under the direction and in the presence of Rolland Porter, MD. Electronically Signed: Georgette Shell, ED Scribe. 08/09/16. 11:39 PM.  Time seen 23:28 PM   History   Chief Complaint Chief Complaint  Patient presents with  . Aspiration   LEVEL 5 CAVEAT:  due to dementia.   HPI Comments: [name] is a 82 y.o. female with h/o aspiration pneumonia, colon cancer, cardiomyopathy, MI, diverticulitis, HTN, and dementia who presents to the Emergency Department complaining of episodic vomiting onset ~two hours ago. Per EMS, pt was being fed Ensure while lying flat about two hours ago. Pt has since vomited a white, foamy substance. Daughter states she did not have any choking episode tonight. Daughter reports pt was fine when she got off work at 3:30 pm; She was also fine at 6 PM when her daughter put rollers in her hair, daughter went to check on her prior to going to bed (~9 pm) where she noted pt was acting "funny" and "not making sense". She states that means her mother was mumbling and she couldn't understand what she was saying. Daughter notes that pt indicated that her head hurt, but patient will have various complaints like that that will change over several minutes. She was not given any OTC medications PTA. Daughter reports that pt had a mild,  cough yesterday but did not notice anything different about her prior to her vomiting. Pt is regularly ambulatory with a walker and assistance. Daughter states she sometimes does not know who the daughter is. Daughter reports that pt has a PCP appointment on 08/15/16. No known fever, chills, or any other associated symptoms.   Daughter states patient had syncopal episodes in 2012 when she had diverticulitis with a diverticular bleed. At that time she required 8 units of blood  transfusions.  PCP: Marshall Cork   The history is provided by the patient, the EMS personnel and a relative. The history is limited by the condition of the patient. No language interpreter was used.    Past Medical History:  Diagnosis Date  . Anxiety   . Arthritis    "joints" (12/02/2013)  . Behavior disturbance   . Cardiomyopathy   . Colon cancer [facility])    daughter denis colon cancer only diverticulitis  . Depression   . Diverticulitis   . Diverticulosis of colon (without mention of hemorrhage) 07/2012  . GERD (gastroesophageal reflux disease)   . Glaucoma   . Herpes zoster   . History of blood transfusion    "related to diverticulitis" (12/02/2013)  . Hypertension   . Hyponatremia 07/2012   Na as low as 114.   Marland Kitchen Hypothyroid   . Internal hemorrhoid 07/2012  . Lumbar back pain   . Myocardial infarction   . Protein calorie malnutrition ([place]) 07/2012   BMI then: 16.5  . Psychosis   . Zenker diverticulum 07/2012   this and esophageal dysmotility on esophagram.     Patient Active Problem List   Diagnosis Date Noted  . Altered mental status 02/08/2016  . Bilateral hearing loss 02/08/2016  . Thrush 11/22/2015  . Syncope 10/23/2015  . Dementia with behavioral disturbance 03/09/2015  . Generalized weakness   . Protein-calorie malnutrition, severe ([place]) 03/08/2015  . General weakness 01/05/2015  . Dark stools 10/20/2014  . Psychosis 09/23/2014  . Decubitus ulcer of buttock,  stage 1 09/23/2014  . Abdominal pain 08/06/2014  . Insomnia 03/10/2014  . Lower GI bleed 03/01/2014  . Encounter for preventative adult health care exam with abnormal findings 01/16/2014  . Peripheral edema 01/16/2014  . Dementia 12/02/2013  . Altered mental state 12/02/2013  . Delirium 12/02/2013  . AKI (acute kidney injury) ([place]) 08/10/2013  . Adult failure to thrive 08/17/2012  . End of life care 08/02/2012  . Benign neoplasm of colon 07/19/2012  . Acute lower GI bleeding 07/16/2012  . Anemia  associated with acute blood loss 08/23/2011  . Renal insufficiency, mild 06/28/2011  . GERD (gastroesophageal reflux disease) 06/04/2011  . Allergic rhinitis 06/04/2011  . ANGINA, STABLE 12/24/2007  . HERPES ZOSTER 11/27/2007  . Hypothyroidism 11/27/2007  . GLAUCOMA 11/27/2007  . Essential hypertension 11/27/2007  . CARDIOMYOPATHY 11/27/2007  . INTERNAL HEMORRHOIDS 11/27/2007  . Diverticulosis of large intestine 11/27/2007  . BACK PAIN, LUMBAR 11/27/2007  . Other acquired absence of organ 11/27/2007    Past Surgical History:  Procedure Laterality Date  . APPENDECTOMY  2005   ruptured appendix  . CATARACT EXTRACTION W/ INTRAOCULAR LENS  IMPLANT, BILATERAL Bilateral   . COLONOSCOPY  07/19/2012   Procedure: COLONOSCOPY;  Surgeon: Ladene Artist, MD,FACG;  Location: [facility];  Service: Endoscopy;  Laterality: N/A;  . GLAUCOMA SURGERY Right   . THYROIDECTOMY    . TUBAL LIGATION      OB History    No data available       Home Medications    Prior to Admission medications   Medication Sig Start Date End Date Taking? Authorizing Provider  acetaminophen (TYLENOL) 500 MG tablet Take 500 mg by mouth every 6 (six) hours as needed for moderate pain.   Yes Historical Provider, MD  Calcium Carbonate-Vit D-Min (CALTRATE 600+D PLUS MINERALS) 600-800 MG-UNIT TABS Take 1 tablet by mouth daily. 11/22/15  Yes Biagio Borg, MD  CARAFATE 1 GM/10ML suspension TAKE 10 MLS (1 G TOTAL) BY MOUTH 4 (FOUR) TIMES DAILY - WITH MEALS AND AT BEDTIME. 03/16/16  Yes Biagio Borg, MD  [employer] VITAMIN B12 1000 MCG tablet TAKE 1 TABLET (1,000 MCG TOTAL) BY MOUTH DAILY. 06/05/16  Yes Biagio Borg, MD  donepezil (ARICEPT) 5 MG tablet Take 1 tablet (5 mg total) by mouth at bedtime. 03/09/15  Yes Janece Canterbury, MD  feeding supplement, ENSURE ENLIVE, (ENSURE ENLIVE) LIQD Take 237 mLs by mouth 3 (three) times daily between meals. 10/25/15  Yes Lavina Hamman, MD  isosorbide mononitrate (IMDUR) 30 MG 24 hr tablet TAKE 1  TABLET (30 MG TOTAL) BY MOUTH DAILY. 07/03/16  Yes Biagio Borg, MD  levothyroxine (SYNTHROID, LEVOTHROID) 88 MCG tablet TAKE 1 TABLET (88 MCG TOTAL) BY MOUTH DAILY BEFORE BREAKFAST. 03/30/16  Yes Biagio Borg, MD  lisinopril (PRINIVIL,ZESTRIL) 10 MG tablet Take 1 tablet (10 mg total) by mouth daily. 03/30/16  Yes Biagio Borg, MD  nitroGLYCERIN (NITROSTAT) 0.4 MG SL tablet Place 1 tablet (0.4 mg total) under the tongue every 5 (five) minutes as needed. Chest pains 10/20/14  Yes Biagio Borg, MD  polyethylene glycol powder (GLYCOLAX/MIRALAX) powder TAKE 17 G BY MOUTH DAILY. 12/21/15  Yes Biagio Borg, MD  sertraline (ZOLOFT) 25 MG tablet TAKE 1 TABLET (25 MG TOTAL) BY MOUTH DAILY. 05/26/16  Yes Biagio Borg, MD  donepezil (ARICEPT) 5 MG tablet TAKE 1 TABLET (5 MG TOTAL) BY MOUTH AT BEDTIME. Patient not taking: Reported on 08/09/2016 03/16/16   Hunt Oris  Jenny Reichmann, MD  isosorbide mononitrate (IMDUR) 30 MG 24 hr tablet Take 1 tablet (30 mg total) by mouth daily. Patient not taking: Reported on 08/09/2016 05/19/15   Biagio Borg, MD  nitrofurantoin, macrocrystal-monohydrate, (MACROBID) 100 MG capsule Take 1 capsule (100 mg total) by mouth 2 (two) times daily. Patient not taking: Reported on 08/09/2016 02/13/16   Biagio Borg, MD  nystatin (MYCOSTATIN) 100000 UNIT/ML suspension Take 5 mLs (500,000 Units total) by mouth 4 (four) times daily. Patient not taking: Reported on 08/09/2016 11/03/15   Biagio Borg, MD  traMADol (ULTRAM) 50 MG tablet Take 1 tablet (50 mg total) by mouth every 12 (twelve) hours as needed for severe pain. Patient not taking: Reported on 08/09/2016 06/19/15   Virgel Manifold, MD    Family History Family History  Problem Relation Age of Onset  . Stroke Daughter   . Heart attack    . Dementia Sister   . Colon cancer Neg Hx     Social History Social History  Substance Use Topics  . Smoking status: Never Smoker  . Smokeless tobacco: Never Used  . Alcohol use No  Lives at home Daughter lives  with patient Uses a walker with assistance   Allergies   Patient has no known allergies.   Review of Systems Review of Systems  Unable to perform ROS: Dementia   Physical Exam Updated Vital Signs SpO2 98%   Physical Exam  Constitutional:  Non-toxic appearance. She does not appear ill. No distress.  Pt is a frail, elderly female.  HENT:  Head: Normocephalic and atraumatic.  Right Ear: External ear normal.  Left Ear: External ear normal.  Nose: Nose normal. No mucosal edema or rhinorrhea.  Refuses to open mouth or open eyes.  Eyes: Conjunctivae and EOM are normal.  Neck: Normal range of motion and full passive range of motion without pain. Neck supple.  Cardiovascular: Normal rate, regular rhythm and normal heart sounds.  Exam reveals no gallop and no friction rub.   No murmur heard. Pulmonary/Chest: Effort normal and breath sounds normal. No respiratory distress. She has no wheezes. She has no rales. She exhibits no tenderness.  Abdominal: Soft. Bowel sounds are normal. She exhibits no distension and no mass. There is no tenderness. There is no guarding.  Musculoskeletal: Normal range of motion. She exhibits no edema or tenderness.  Moves all extremities well.  Neurological:  Pt appears to be sleeping. Patient is leaning to the right which daughter states is normal for her.  Skin: Skin is warm and dry. No rash noted. No erythema. No pallor.  Psychiatric: She is noncommunicative.  Pt appears to be sleeping and does not follow commands. Non-verbal.  Nursing note and vitals reviewed.    ED Treatments / Results  DIAGNOSTIC STUDIES: Oxygen Saturation is 98% on RA, normal by my interpretation.     Labs (all labs ordered are listed, but only abnormal results are displayed) Results for orders placed or performed during the hospital encounter of 08/09/16  Comprehensive metabolic panel  Result Value Ref Range   Sodium 141 135 - 145 mmol/L   Potassium 3.2 (L) 3.5 - 5.1 mmol/L    Chloride 104 101 - 111 mmol/L   CO2 28 22 - 32 mmol/L   Glucose, Bld 147 (H) 65 - 99 mg/dL   BUN 18 6 - 20 mg/dL   Creatinine, Ser 0.92 0.44 - 1.00 mg/dL   Calcium 8.7 (L) 8.9 - 10.3 mg/dL   Total Protein 6.1 (L)  6.5 - 8.1 g/dL   Albumin 3.3 (L) 3.5 - 5.0 g/dL   AST 20 15 - 41 U/L   ALT 9 (L) 14 - 54 U/L   Alkaline Phosphatase 60 38 - 126 U/L   Total Bilirubin 0.6 0.3 - 1.2 mg/dL   GFR calc non Af Amer 51 (L) >60 mL/min   GFR calc Af Amer 59 (L) >60 mL/min   Anion gap 9 5 - 15  Troponin I  Result Value Ref Range   Troponin I <0.03 <0.03 ng/mL  CBC with Differential  Result Value Ref Range   WBC 4.7 4.0 - 10.5 K/uL   RBC 3.45 (L) 3.87 - 5.11 MIL/uL   Hemoglobin 9.5 (L) 12.0 - 15.0 g/dL   HCT 29.3 (L) 36.0 - 46.0 %   MCV 84.9 78.0 - 100.0 fL   MCH 27.5 26.0 - 34.0 pg   MCHC 32.4 30.0 - 36.0 g/dL   RDW 16.7 (H) 11.5 - 15.5 %   Platelets 191 150 - 400 K/uL   Neutrophils Relative % 69 %   Neutro Abs 3.3 1.7 - 7.7 K/uL   Lymphocytes Relative 22 %   Lymphs Abs 1.0 0.7 - 4.0 K/uL   Monocytes Relative 7 %   Monocytes Absolute 0.3 0.1 - 1.0 K/uL   Eosinophils Relative 1 %   Eosinophils Absolute 0.0 0.0 - 0.7 K/uL   Basophils Relative 1 %   Basophils Absolute 0.0 0.0 - 0.1 K/uL  Urinalysis, Routine w reflex microscopic  Result Value Ref Range   Color, Urine YELLOW YELLOW   APPearance CLOUDY (A) CLEAR   Specific Gravity, Urine 1.011 1.005 - 1.030   pH 7.0 5.0 - 8.0   Glucose, UA NEGATIVE NEGATIVE mg/dL   Hgb urine dipstick NEGATIVE NEGATIVE   Bilirubin Urine NEGATIVE NEGATIVE   Ketones, ur NEGATIVE NEGATIVE mg/dL   Protein, ur 30 (A) NEGATIVE mg/dL   Nitrite NEGATIVE NEGATIVE   Leukocytes, UA NEGATIVE NEGATIVE  Lactic acid, plasma  Result Value Ref Range   Lactic Acid, Venous 1.9 0.5 - 1.9 mmol/L  Lactic acid, plasma  Result Value Ref Range   Lactic Acid, Venous 3.9 (HH) 0.5 - 1.9 mmol/L  Urine microscopic-add on  Result Value Ref Range   Squamous Epithelial / LPF  0-5 (A) NONE SEEN   WBC, UA 0-5 0 - 5 WBC/hpf   RBC / HPF NONE SEEN 0 - 5 RBC/hpf   Bacteria, UA NONE SEEN NONE SEEN   Laboratory interpretation all normal except 3 hour lactic acid elevated (drawn during seizure), stable anemia, hypokalemia    EKG  EKG Interpretation  Date/Time:  Thursday August 10 2016 00:21:41 EST Ventricular Rate:  73 PR Interval:    QRS Duration: 109 QT Interval:  459 QTC Calculation: 506 R Axis:   -49 Text Interpretation:  Sinus or ectopic atrial rhythm LAD, consider left anterior fascicular block Nonspecific T abnrm, anterolateral leads Prolonged QT interval No significant change since last tracing 23 Oct 2015 Confirmed by Qianna Clagett  MD-I, Ilithyia Titzer ([id number]) on 08/10/2016 12:39:21 AM       Radiology Ct Head Wo Contrast  Result Date: 08/10/2016 CLINICAL DATA:  Acute onset of vomiting and altered mental status. Initial encounter. EXAM: CT HEAD WITHOUT CONTRAST TECHNIQUE: Contiguous axial images were obtained from the base of the skull through the vertex without intravenous contrast. COMPARISON:  CT of the head performed 28-Jun-202016, and MRI of the brain from 03/08/2015 FINDINGS: Brain: No evidence of acute infarction, hemorrhage, hydrocephalus,  extra-axial collection or mass lesion/mass effect. Prominence of the ventricles and sulci reflects moderate cortical volume loss. Cerebellar atrophy is noted. Diffuse periventricular and subcortical white matter change likely reflects small vessel ischemic microangiopathy. The brainstem and fourth ventricle are within normal limits. The basal ganglia are unremarkable in appearance. The cerebral hemispheres demonstrate grossly normal gray-white differentiation. No mass effect or midline shift is seen. Vascular: No hyperdense vessel or unexpected calcification. Skull: There is no evidence of fracture; visualized osseous structures are unremarkable in appearance. Sinuses/Orbits: The visualized portions of the orbits are within normal  limits. The paranasal sinuses and mastoid air cells are well-aerated. Other: No significant soft tissue abnormalities are seen. IMPRESSION: 1. No acute intracranial pathology seen on CT. 2. Moderate cortical volume loss and diffuse small vessel ischemic microangiopathy. Electronically Signed   By: Garald Balding M.D.   On: 08/10/2016 00:02   Dg Chest Port 1 View  Result Date: 08/09/2016 CLINICAL DATA:  Cough for 24 hours.  Altered mental status tonight. EXAM: PORTABLE CHEST 1 VIEW COMPARISON:  10/23/2015 FINDINGS: Shallow inspiration. Mild cardiac enlargement. No pulmonary vascular congestion. No edema or consolidation in the lungs. No blunting of costophrenic angles. No pneumothorax. Calcified and tortuous aorta. IMPRESSION: Shallow inspiration. No evidence of active pulmonary disease. Mild cardiac enlargement. Tortuous aorta. Aortic atherosclerosis. Electronically Signed   By: Lucienne Capers M.D.   On: 08/09/2016 23:51    Procedures Procedures (including critical care time)  Medications Ordered in ED Medications  ondansetron (ZOFRAN) injection 4 mg (not administered)  sodium chloride 0.9 % bolus 1,000 mL (0 mLs Intravenous Stopped 08/10/16 0153)  sodium chloride 0.9 % bolus 500 mL (0 mLs Intravenous Stopped 08/10/16 0051)  LORazepam (ATIVAN) injection 0.5 mg (0.5 mg Intravenous Given 08/10/16 0326)  levETIRAcetam (KEPPRA) 500 mg in sodium chloride 0.9 % 100 mL IVPB (0 mg Intravenous Stopped 08/10/16 0344)  LORazepam (ATIVAN) injection 0.5 mg (0.5 mg Intravenous Given 08/10/16 0326)  LORazepam (ATIVAN) injection 0.5 mg (0.5 mg Intravenous Given 08/10/16 0325)  LORazepam (ATIVAN) injection 0.5 mg ( Intravenous Given 08/10/16 0325)  LORazepam (ATIVAN) injection 0.5 mg (0.5 mg Intravenous Given 08/10/16 0325)  LORazepam (ATIVAN) injection 0.5 mg (0.5 mg Intravenous Given 08/10/16 0324)  sodium chloride 0.9 % bolus 1,000 mL (1,000 mLs Intravenous New Bag/Given 08/10/16 0402)     Initial  Impression / Assessment and Plan / ED Course  I have reviewed the triage vital signs and the nursing notes.  Pertinent labs & imaging results that were available during my care of the patient were reviewed by me and considered in my medical decision making (see chart for details).  Clinical Course    COORDINATION OF CARE: 11:38 PM Discussed treatment plan with pt at bedside which includes IV fluids, blood work, and urinalysis and pt agreed to plan.   3:02 AM Eyes are deviated to right, rhythmic twitching of right face and RUE and RLE. Will give Ativan titrated due to her age and small size for seizure activity. Daughter states she had a seizure several years ago and then again a couple years later. She's unsure what evaluation she had for the seizures. Patient is not on seizure medication. Daughter states there are other 2 seizures did not last this long.  Patient was suctioned as needed by me, she was placed on nasal cannula oxygen. Her pulse ox was 99-100%.  3:22 AM Pt's twitching just stopped. She received a total of Ativan 3.5 mg given 0.5 mg at a time before her  seizure activity stopped. She was started on IV Keppra.Patient seizure lasted over 20 minutes. I am going to talk to the hospitalist about admission.  4:25 AM Dr. Loleta Books states to admit however he wants to do the orders.  Final Clinical Impressions(s) / ED Diagnoses   Final diagnoses:  Altered mental status, unspecified altered mental status type  Status epilepticus ([place])    Plan admission  Rolland Porter, MD, FACEP  CRITICAL CARE Performed by: Donathan Buller L Carle Fenech Total critical care time: 35 minutes Critical care time was exclusive of separately billable procedures and treating other patients. Critical care was necessary to treat or prevent imminent or life-threatening deterioration. Critical care was time spent personally by me on the following activities: development of treatment plan with patient and/or surrogate as well as  nursing, discussions with consultants, evaluation of patient's response to treatment, examination of patient, obtaining history from patient or surrogate, ordering and performing treatments and interventions, ordering and review of laboratory studies, ordering and review of radiographic studies, pulse oximetry and re-evaluation of patient's condition.    I personally performed the services described in this documentation, which was scribed in my presence. The recorded information has been reviewed and considered.  Rolland Porter, MD, Barbette Or, MD 08/10/16 [phone]

## 2016-08-09 NOTE — ED Notes (Signed)
Bed: [id number] Expected date:  Expected time:  Means of arrival:  Comments: EMS 82 yo female from home/aspirated ensure

## 2016-08-09 NOTE — ED Triage Notes (Signed)
Per [facility] Personnel, pt was being fed ensure while lying flat about one hr ago from this time. Pt has since and white foamy emesis. Pt baseline dementia oriented to self only. Pt had hx of aspiration PNA. Per pt daughter, pt has been altered since emesis episode. Pt currently leaning to left or right with skin facial droop (r/t weight loss) is pt baseline.

## 2016-08-10 ENCOUNTER — Inpatient Hospital Stay (HOSPITAL_COMMUNITY)
Admit: 2016-08-10 | Discharge: 2016-08-10 | Disposition: A | Discharge status: Home / Self Care | Payer: Commercial Managed Care - HMO | Attending: Internal Medicine | Admitting: Internal Medicine

## 2016-08-10 ENCOUNTER — Encounter (HOSPITAL_COMMUNITY): Payer: Self-pay | Admitting: *Deleted

## 2016-08-10 DIAGNOSIS — Z8249 Family history of ischemic heart disease and other diseases of the circulatory system: Secondary | ICD-10-CM | POA: Diagnosis not present

## 2016-08-10 DIAGNOSIS — I10 Essential (primary) hypertension: Secondary | ICD-10-CM | POA: Diagnosis not present

## 2016-08-10 DIAGNOSIS — E43 Unspecified severe protein-calorie malnutrition: Secondary | ICD-10-CM

## 2016-08-10 DIAGNOSIS — E876 Hypokalemia: Secondary | ICD-10-CM | POA: Diagnosis not present

## 2016-08-10 DIAGNOSIS — H409 Unspecified glaucoma: Secondary | ICD-10-CM | POA: Diagnosis present

## 2016-08-10 DIAGNOSIS — D649 Anemia, unspecified: Secondary | ICD-10-CM | POA: Diagnosis present

## 2016-08-10 DIAGNOSIS — I252 Old myocardial infarction: Secondary | ICD-10-CM | POA: Diagnosis not present

## 2016-08-10 DIAGNOSIS — K219 Gastro-esophageal reflux disease without esophagitis: Secondary | ICD-10-CM | POA: Diagnosis present

## 2016-08-10 DIAGNOSIS — G40901 Epilepsy, unspecified, not intractable, with status epilepticus: Secondary | ICD-10-CM | POA: Diagnosis present

## 2016-08-10 DIAGNOSIS — Z66 Do not resuscitate: Secondary | ICD-10-CM | POA: Diagnosis present

## 2016-08-10 DIAGNOSIS — R4182 Altered mental status, unspecified: Secondary | ICD-10-CM

## 2016-08-10 DIAGNOSIS — Z961 Presence of intraocular lens: Secondary | ICD-10-CM | POA: Diagnosis present

## 2016-08-10 DIAGNOSIS — E039 Hypothyroidism, unspecified: Secondary | ICD-10-CM | POA: Diagnosis present

## 2016-08-10 DIAGNOSIS — I129 Hypertensive chronic kidney disease with stage 1 through stage 4 chronic kidney disease, or unspecified chronic kidney disease: Secondary | ICD-10-CM | POA: Diagnosis present

## 2016-08-10 DIAGNOSIS — Z9842 Cataract extraction status, left eye: Secondary | ICD-10-CM | POA: Diagnosis not present

## 2016-08-10 DIAGNOSIS — E860 Dehydration: Secondary | ICD-10-CM | POA: Diagnosis present

## 2016-08-10 DIAGNOSIS — Z681 Body mass index (BMI) 19 or less, adult: Secondary | ICD-10-CM | POA: Diagnosis not present

## 2016-08-10 DIAGNOSIS — F0391 Unspecified dementia with behavioral disturbance: Secondary | ICD-10-CM | POA: Diagnosis present

## 2016-08-10 DIAGNOSIS — Z9841 Cataract extraction status, right eye: Secondary | ICD-10-CM | POA: Diagnosis not present

## 2016-08-10 DIAGNOSIS — Z79899 Other long term (current) drug therapy: Secondary | ICD-10-CM | POA: Diagnosis not present

## 2016-08-10 DIAGNOSIS — N183 Chronic kidney disease, stage 3 (moderate): Secondary | ICD-10-CM | POA: Diagnosis present

## 2016-08-10 DIAGNOSIS — R569 Unspecified convulsions: Secondary | ICD-10-CM | POA: Diagnosis not present

## 2016-08-10 DIAGNOSIS — Z85038 Personal history of other malignant neoplasm of large intestine: Secondary | ICD-10-CM | POA: Diagnosis not present

## 2016-08-10 DIAGNOSIS — R0902 Hypoxemia: Secondary | ICD-10-CM | POA: Diagnosis not present

## 2016-08-10 DIAGNOSIS — I429 Cardiomyopathy, unspecified: Secondary | ICD-10-CM | POA: Diagnosis present

## 2016-08-10 LAB — URINALYSIS, ROUTINE W REFLEX MICROSCOPIC
Bilirubin Urine: NEGATIVE
GLUCOSE, UA: NEGATIVE mg/dL
HGB URINE DIPSTICK: NEGATIVE
Ketones, ur: NEGATIVE mg/dL
LEUKOCYTES UA: NEGATIVE
Nitrite: NEGATIVE
Protein, ur: 30 mg/dL — AB
SPECIFIC GRAVITY, URINE: 1.011 (ref 1.005–1.030)
pH: 7 (ref 5.0–8.0)

## 2016-08-10 LAB — TROPONIN I

## 2016-08-10 LAB — CBC WITH DIFFERENTIAL/PLATELET
BASOS ABS: 0 10*3/uL (ref 0.0–0.1)
Basophils Relative: 1 %
Eosinophils Absolute: 0 10*3/uL (ref 0.0–0.7)
Eosinophils Relative: 1 %
HEMATOCRIT: 29.3 % — AB (ref 36.0–46.0)
HEMOGLOBIN: 9.5 g/dL — AB (ref 12.0–15.0)
LYMPHS ABS: 1 10*3/uL (ref 0.7–4.0)
LYMPHS PCT: 22 %
MCH: 27.5 pg (ref 26.0–34.0)
MCHC: 32.4 g/dL (ref 30.0–36.0)
MCV: 84.9 fL (ref 78.0–100.0)
Monocytes Absolute: 0.3 10*3/uL (ref 0.1–1.0)
Monocytes Relative: 7 %
NEUTROS ABS: 3.3 10*3/uL (ref 1.7–7.7)
NEUTROS PCT: 69 %
Platelets: 191 10*3/uL (ref 150–400)
RBC: 3.45 MIL/uL — ABNORMAL LOW (ref 3.87–5.11)
RDW: 16.7 % — ABNORMAL HIGH (ref 11.5–15.5)
WBC: 4.7 10*3/uL (ref 4.0–10.5)

## 2016-08-10 LAB — URINE MICROSCOPIC-ADD ON
BACTERIA UA: NONE SEEN
RBC / HPF: NONE SEEN RBC/hpf (ref 0–5)

## 2016-08-10 LAB — COMPREHENSIVE METABOLIC PANEL
ALT: 9 U/L — AB (ref 14–54)
AST: 20 U/L (ref 15–41)
Albumin: 3.3 g/dL — ABNORMAL LOW (ref 3.5–5.0)
Alkaline Phosphatase: 60 U/L (ref 38–126)
Anion gap: 9 (ref 5–15)
BUN: 18 mg/dL (ref 6–20)
CHLORIDE: 104 mmol/L (ref 101–111)
CO2: 28 mmol/L (ref 22–32)
CREATININE: 0.92 mg/dL (ref 0.44–1.00)
Calcium: 8.7 mg/dL — ABNORMAL LOW (ref 8.9–10.3)
GFR calc non Af Amer: 51 mL/min — ABNORMAL LOW (ref >60)
GFR, EST AFRICAN AMERICAN: 59 mL/min — AB (ref >60)
Glucose, Bld: 147 mg/dL — ABNORMAL HIGH (ref 65–99)
POTASSIUM: 3.2 mmol/L — AB (ref 3.5–5.1)
SODIUM: 141 mmol/L (ref 135–145)
Total Bilirubin: 0.6 mg/dL (ref 0.3–1.2)
Total Protein: 6.1 g/dL — ABNORMAL LOW (ref 6.5–8.1)

## 2016-08-10 LAB — LACTIC ACID, PLASMA
Lactic Acid, Venous: 1.8 mmol/L (ref 0.5–1.9)
Lactic Acid, Venous: 1.9 mmol/L (ref 0.5–1.9)
Lactic Acid, Venous: 3.9 mmol/L (ref 0.5–1.9)

## 2016-08-10 LAB — GLUCOSE, CAPILLARY: Glucose-Capillary: 80 mg/dL (ref 65–99)

## 2016-08-10 MED ORDER — LORAZEPAM 2 MG/ML IJ SOLN
0.5000 mg | Freq: Once | INTRAMUSCULAR | Status: AC
  Administered 2016-08-10: 0.5 mg via INTRAVENOUS

## 2016-08-10 MED ORDER — SODIUM CHLORIDE 0.9 % IV BOLUS (SEPSIS)
1000.0000 mL | Freq: Once | INTRAVENOUS | Status: AC
  Administered 2016-08-10: 1000 mL via INTRAVENOUS

## 2016-08-10 MED ORDER — ENSURE ENLIVE PO LIQD
237.0000 mL | Freq: Three times a day (TID) | ORAL | Status: DC
  Administered 2016-08-14 – 2016-08-15 (×2): 237 mL via ORAL

## 2016-08-10 MED ORDER — LORAZEPAM 2 MG/ML IJ SOLN
0.5000 mg | Freq: Once | INTRAMUSCULAR | Status: AC
Start: 2016-08-10 — End: 2016-08-10
  Administered 2016-08-10: 0.5 mg via INTRAVENOUS

## 2016-08-10 MED ORDER — ONDANSETRON HCL 4 MG PO TABS: 4.0000 mg | ORAL_TABLET | Freq: Four times a day (QID) | ORAL | Status: DC | PRN

## 2016-08-10 MED ORDER — LORAZEPAM 2 MG/ML IJ SOLN
INTRAMUSCULAR | Status: AC
  Filled 2016-08-10: qty 1

## 2016-08-10 MED ORDER — SUCRALFATE 1 GM/10ML PO SUSP
1.0000 g | Freq: Three times a day (TID) | ORAL | Status: DC
  Administered 2016-08-12 – 2016-08-15 (×10): 1 g via ORAL
  Filled 2016-08-10 (×10): qty 10

## 2016-08-10 MED ORDER — ACETAMINOPHEN 650 MG RE SUPP: 650.0000 mg | Freq: Four times a day (QID) | RECTAL | Status: DC | PRN

## 2016-08-10 MED ORDER — ONDANSETRON HCL 4 MG/2ML IJ SOLN: 4.0000 mg | Freq: Four times a day (QID) | INTRAMUSCULAR | Status: DC | PRN

## 2016-08-10 MED ORDER — SODIUM CHLORIDE 0.9 % IV SOLN
500.0000 mg | Freq: Two times a day (BID) | INTRAVENOUS | Status: DC
  Administered 2016-08-10 – 2016-08-14 (×9): 500 mg via INTRAVENOUS
  Filled 2016-08-10 (×10): qty 5

## 2016-08-10 MED ORDER — ACETAMINOPHEN 325 MG PO TABS: 650.0000 mg | ORAL_TABLET | Freq: Four times a day (QID) | ORAL | Status: DC | PRN

## 2016-08-10 MED ORDER — SODIUM CHLORIDE 0.9 % IV SOLN
500.0000 mg | Freq: Once | INTRAVENOUS | Status: AC
  Administered 2016-08-10: 500 mg via INTRAVENOUS
  Filled 2016-08-10: qty 5

## 2016-08-10 MED ORDER — LORAZEPAM 2 MG/ML IJ SOLN
0.5000 mg | Freq: Once | INTRAMUSCULAR | Status: AC
Start: 2016-08-10 — End: 2016-08-10
  Administered 2016-08-10: 03:00:00 via INTRAVENOUS

## 2016-08-10 MED ORDER — LIP MEDEX EX OINT
TOPICAL_OINTMENT | CUTANEOUS | Status: AC
  Administered 2016-08-10: 22:00:00
  Filled 2016-08-10: qty 7

## 2016-08-10 MED ORDER — LEVETIRACETAM 500 MG PO TABS: 500.0000 mg | ORAL_TABLET | Freq: Two times a day (BID) | ORAL | Status: DC

## 2016-08-10 MED ORDER — POLYETHYLENE GLYCOL 3350 17 G PO PACK
17.0000 g | PACK | Freq: Every day | ORAL | Status: DC
  Administered 2016-08-12 – 2016-08-15 (×3): 17 g via ORAL
  Filled 2016-08-10 (×3): qty 1

## 2016-08-10 MED ORDER — DONEPEZIL HCL 10 MG PO TABS
5.0000 mg | ORAL_TABLET | Freq: Every day | ORAL | Status: DC
  Administered 2016-08-12 – 2016-08-14 (×3): 5 mg via ORAL
  Filled 2016-08-10 (×4): qty 1

## 2016-08-10 MED ORDER — ONDANSETRON HCL 4 MG/2ML IJ SOLN: 4.0000 mg | Freq: Once | INTRAMUSCULAR | Status: DC

## 2016-08-10 MED ORDER — ENOXAPARIN SODIUM 40 MG/0.4ML ~~LOC~~ SOLN
40.0000 mg | SUBCUTANEOUS | Status: DC
  Administered 2016-08-10 – 2016-08-13 (×4): 40 mg via SUBCUTANEOUS
  Filled 2016-08-10 (×4): qty 0.4

## 2016-08-10 MED ORDER — LEVOTHYROXINE SODIUM 88 MCG PO TABS
88.0000 ug | ORAL_TABLET | Freq: Every day | ORAL | Status: DC
  Administered 2016-08-12 – 2016-08-15 (×4): 88 ug via ORAL
  Filled 2016-08-10 (×4): qty 1

## 2016-08-10 NOTE — ED Notes (Signed)
Pt continues to be sleeping.

## 2016-08-10 NOTE — ED Notes (Signed)
Pt's seizure activity stopped.

## 2016-08-10 NOTE — Progress Notes (Signed)
Patient seen and examined. Admitted after midnight secondary to possible seizure activity. Had hx of seizure in the past and also dementia. Patient is hemodynamically stable, even very somnolent from use of lorazepam. No further seizure appreciated. Neurology has seen patient and recommend to continue keppra BID for now. Will follow MRI and EEG results. Please refer to Dr. Loleta Books H&P for further info/details on admission.  [name] [id number]

## 2016-08-10 NOTE — ED Notes (Signed)
Dr. Loleta Books aware of pt's BP and states it is d/t pt being asleep. Pt continues to be resting comfortably and not further seizure-like activity noted.

## 2016-08-10 NOTE — Progress Notes (Signed)
Pt attempting to speak, but it is garbled. Sleeping at present

## 2016-08-10 NOTE — ED Notes (Signed)
Pt appears to be twitching and gasping as if something is in her throat.  Attempted to suction pt and only obtain saliva. Pt is now tachycardic and lungs sounds clear.  Unable to visualize mouth d/t pt not opening mouth.  Dr. Tomi Bamberger made aware and is currently at the bedside.

## 2016-08-10 NOTE — Progress Notes (Signed)
Patient still unarousable. Vital signs checked and documented earlier. On cont. Pulse oximetry. All meds not given this am. MD aware. Will continue to monitor.

## 2016-08-10 NOTE — Progress Notes (Signed)
[name] is a 82 y.o. female patient admitted from ED asleep - no acute distress noted.  VSS - Blood pressure 106/88, pulse (!) 58, temperature 97.8 F (36.6 C), temperature source Axillary, resp. rate 14, SpO2 97 %.    IV in place, occlusive dsg intact without redness.  Orientation to room, and floor completed with information packet given to patient.  Admission INP armband ID verified with patient/family, and in place.   SR up x 2, fall assessment complete, bed alarm on, call light within reach. Skin, clean-dry- intact without evidence of bruising, or skin tears.   Patient incontinent with evidence of skin break down noted on exam.     Will cont to eval and treat per MD orders.  Marcy Salvo, RN 08/10/2016 6:53 AM

## 2016-08-10 NOTE — Progress Notes (Signed)
Patient opened eyes once and quickly goes back to sleep. Mouth care provided. Will continue to monitor.

## 2016-08-10 NOTE — H&P (Signed)
History and Physical  Patient Name: [name]     [id number]    DOB: [birth year]    DOA: 08/09/2016 PCP: Cathlean Cower, MD   Patient coming from: Home  Chief Complaint: Aspiration  HPI: [name] is a 82 y.o. female with a past medical history significant for advanced dementia, hypothyroidism, HTN, CKD stage III and chronic anemia who presents with aspiration episode.  Caveat that patient has dementia and is unable to answer questions, is currently sedated with lorazepam, and no family is present at bedside so all history collected from [place].  Reportedly, the patient was in her normal state of health today, around 6:30 PM her daughter put curlers in her hair in order to get ready for Thanksgiving dinner, and then around 9:30 PM her daughter went back to her room and found her "talking out of her head", mumbling, and inattentive.  Shortly after that, the patient was described as "vomiting a white, foamy substance" possibly in the context of eating Ensure, and so daughter called EMS because of concern that she aspirated.  ED course: -Afebrile, heart rate 70s, respirations and pulse ox normal, blood pressure 100/60 -Na 141, K 3.2, Cr 0.92, WBC 4.7K, Hgb 9.5 stable -Troponin negative, lactate normal, urinalysis unremarkable -Chest x-ray clear, CT head normal for age -ECG showed normal sinus rhythm -While in the ER, the patient developed an episode where she was "choking" and "twitching", was suctioned but had only saliva, ED provider came into room and found patient with eyes deviated to right, and whole right side with clonic jerking and face twitching -This episode lasted about 20 minutes, and slowly resolved after lorazepam 3.5 mg IV after which she was given levetiracetam 500 mg -TRH were called and asked to evaluate the patient for seizure    Daughter subsequently explained the patient has had seizures in the past, about every 2 years. She is not currently nor has ever been on an  AED. From chart review, it appears that the patient had 2 episodes in 2013 with seizure, one with either generalized or unilateral myoclonic jerking (although this was in the context of a sodium of 114), and another time with what are described in the discharge summary as "possible abscence Episodes".  I can find no other history of seizures.         ROS: Review of Systems  Unable to perform ROS: Dementia          Past Medical History:  Diagnosis Date  . Anxiety   . Arthritis    "joints" (12/02/2013)  . Behavior disturbance   . Cardiomyopathy   . Colon cancer [facility])    daughter denis colon cancer only diverticulitis  . Depression   . Diverticulitis   . Diverticulosis of colon (without mention of hemorrhage) 07/2012  . GERD (gastroesophageal reflux disease)   . Glaucoma   . Herpes zoster   . History of blood transfusion    "related to diverticulitis" (12/02/2013)  . Hypertension   . Hyponatremia 07/2012   Na as low as 114.   Marland Kitchen Hypothyroid   . Internal hemorrhoid 07/2012  . Lumbar back pain   . Myocardial infarction   . Protein calorie malnutrition ([place]) 07/2012   BMI then: 16.5  . Psychosis   . Zenker diverticulum 07/2012   this and esophageal dysmotility on esophagram.     Past Surgical History:  Procedure Laterality Date  . APPENDECTOMY  2005   ruptured appendix  . CATARACT EXTRACTION W/ INTRAOCULAR LENS  IMPLANT, BILATERAL Bilateral   . COLONOSCOPY  07/19/2012   Procedure: COLONOSCOPY;  Surgeon: Ladene Artist, MD,FACG;  Location: [facility] ENDOSCOPY;  Service: Endoscopy;  Laterality: N/A;  . GLAUCOMA SURGERY Right   . THYROIDECTOMY    . TUBAL LIGATION      Social History: Patient lives with her daughter. She makes her needs known and baseline but she does not have conversations. Requires assistance with all ADLs. Uses a walker at baseline. Nonsmoker.  No Known Allergies  Family history: family history includes Dementia in her sister; Stroke in her  daughter.  Prior to Admission medications   Medication Sig Start Date End Date Taking? Authorizing Provider  acetaminophen (TYLENOL) 500 MG tablet Take 500 mg by mouth every 6 (six) hours as needed for moderate pain.   Yes Historical Provider, MD  Calcium Carbonate-Vit D-Min (CALTRATE 600+D PLUS MINERALS) 600-800 MG-UNIT TABS Take 1 tablet by mouth daily. 11/22/15  Yes Biagio Borg, MD  CARAFATE 1 GM/10ML suspension TAKE 10 MLS (1 G TOTAL) BY MOUTH 4 (FOUR) TIMES DAILY - WITH MEALS AND AT BEDTIME. 03/16/16  Yes Biagio Borg, MD  [employer] VITAMIN B12 1000 MCG tablet TAKE 1 TABLET (1,000 MCG TOTAL) BY MOUTH DAILY. 06/05/16  Yes Biagio Borg, MD  donepezil (ARICEPT) 5 MG tablet Take 1 tablet (5 mg total) by mouth at bedtime. 03/09/15  Yes Janece Canterbury, MD  feeding supplement, ENSURE ENLIVE, (ENSURE ENLIVE) LIQD Take 237 mLs by mouth 3 (three) times daily between meals. 10/25/15  Yes Lavina Hamman, MD  isosorbide mononitrate (IMDUR) 30 MG 24 hr tablet TAKE 1 TABLET (30 MG TOTAL) BY MOUTH DAILY. 07/03/16  Yes Biagio Borg, MD  levothyroxine (SYNTHROID, LEVOTHROID) 88 MCG tablet TAKE 1 TABLET (88 MCG TOTAL) BY MOUTH DAILY BEFORE BREAKFAST. 03/30/16  Yes Biagio Borg, MD  lisinopril (PRINIVIL,ZESTRIL) 10 MG tablet Take 1 tablet (10 mg total) by mouth daily. 03/30/16  Yes Biagio Borg, MD  nitroGLYCERIN (NITROSTAT) 0.4 MG SL tablet Place 1 tablet (0.4 mg total) under the tongue every 5 (five) minutes as needed. Chest pains 10/20/14  Yes Biagio Borg, MD  polyethylene glycol powder (GLYCOLAX/MIRALAX) powder TAKE 17 G BY MOUTH DAILY. 12/21/15  Yes Biagio Borg, MD  sertraline (ZOLOFT) 25 MG tablet TAKE 1 TABLET (25 MG TOTAL) BY MOUTH DAILY. 05/26/16  Yes Biagio Borg, MD  donepezil (ARICEPT) 5 MG tablet TAKE 1 TABLET (5 MG TOTAL) BY MOUTH AT BEDTIME. Patient not taking: Reported on 08/09/2016 03/16/16   Biagio Borg, MD  isosorbide mononitrate (IMDUR) 30 MG 24 hr tablet Take 1 tablet (30 mg total) by mouth daily. Patient  not taking: Reported on 08/09/2016 05/19/15   Biagio Borg, MD  nitrofurantoin, macrocrystal-monohydrate, (MACROBID) 100 MG capsule Take 1 capsule (100 mg total) by mouth 2 (two) times daily. Patient not taking: Reported on 08/09/2016 02/13/16   Biagio Borg, MD  nystatin (MYCOSTATIN) 100000 UNIT/ML suspension Take 5 mLs (500,000 Units total) by mouth 4 (four) times daily. Patient not taking: Reported on 08/09/2016 11/03/15   Biagio Borg, MD  traMADol (ULTRAM) 50 MG tablet Take 1 tablet (50 mg total) by mouth every 12 (twelve) hours as needed for severe pain. Patient not taking: Reported on 08/09/2016 06/19/15   Virgel Manifold, MD       Physical Exam: BP 147/73   Pulse 65   Temp 98.1 F (36.7 C) (Rectal)   Resp 24   SpO2 100%  General appearance: Frail elderly female, sleeping, not rousable.   Eyes: Anicteric, conjunctiva pink, lids and lashes normal. PERRL.    ENT: No nasal deformity, discharge, epistaxis.  Hearing unable to test. OP moist without lesions.   Skin: Warm and dry.  No jaundice.  No suspicious rashes or lesions. Cardiac: RRR, nl S1-S2, no murmurs appreciated.  Capillary refill is brisk.  No LE edema.  Radial and DP pulses 2+ and symmetric. Respiratory: Normal respiratory rate and rhythm.  CTAB without rales or wheezes. Abdomen: Abdomen soft.  No TTP. No ascites, distension, hepatosplenomegaly.   MSK: No deformities or effusions.  No cyanosis or clubbing.  Diffuse lack of muscle mass. Neuro: PERRL.  Sleeping.  Otherwise does not respond to stimuli because of lorazepam.  Psych: Unable to assess.     Labs on Admission:  I have personally reviewed following labs and imaging studies: CBC:  Recent Labs Lab 08/10/16 0007  WBC 4.7  NEUTROABS 3.3  HGB 9.5*  HCT 29.3*  MCV 84.9  PLT 99991111   Basic Metabolic Panel:  Recent Labs Lab 08/10/16 0007  NA 141  K 3.2*  CL 104  CO2 28  GLUCOSE 147*  BUN 18  CREATININE 0.92  CALCIUM 8.7*   GFR: CrCl cannot be  calculated (Unknown ideal weight.).  Liver Function Tests:  Recent Labs Lab 08/10/16 0007  AST 20  ALT 9*  ALKPHOS 60  BILITOT 0.6  PROT 6.1*  ALBUMIN 3.3*   No results for input(s): LIPASE, AMYLASE in the last 168 hours. No results for input(s): AMMONIA in the last 168 hours. Coagulation Profile: No results for input(s): INR, PROTIME in the last 168 hours. Cardiac Enzymes:  Recent Labs Lab 08/10/16 0007  TROPONINI <0.03   BNP (last 3 results) No results for input(s): PROBNP in the last 8760 hours. HbA1C: No results for input(s): HGBA1C in the last 72 hours. CBG: No results for input(s): GLUCAP in the last 168 hours. Lipid Profile: No results for input(s): CHOL, HDL, LDLCALC, TRIG, CHOLHDL, LDLDIRECT in the last 72 hours. Thyroid Function Tests: No results for input(s): TSH, T4TOTAL, FREET4, T3FREE, THYROIDAB in the last 72 hours. Anemia Panel: No results for input(s): VITAMINB12, FOLATE, FERRITIN, TIBC, IRON, RETICCTPCT in the last 72 hours. Sepsis Labs: Lactic acid 1.9 --> 3.9 Invalid input(s): PROCALCITONIN, LACTICIDVEN No results found for this or any previous visit (from the past 240 hour(s)).       Radiological Exams on Admission: Personally reviewed CXR shows no focal opacity; CT head report reviewed: Ct Head Wo Contrast  Result Date: 08/10/2016 CLINICAL DATA:  Acute onset of vomiting and altered mental status. Initial encounter. EXAM: CT HEAD WITHOUT CONTRAST TECHNIQUE: Contiguous axial images were obtained from the base of the skull through the vertex without intravenous contrast. COMPARISON:  CT of the head performed 07-11-202016, and MRI of the brain from 03/08/2015 FINDINGS: Brain: No evidence of acute infarction, hemorrhage, hydrocephalus, extra-axial collection or mass lesion/mass effect. Prominence of the ventricles and sulci reflects moderate cortical volume loss. Cerebellar atrophy is noted. Diffuse periventricular and subcortical white matter change  likely reflects small vessel ischemic microangiopathy. The brainstem and fourth ventricle are within normal limits. The basal ganglia are unremarkable in appearance. The cerebral hemispheres demonstrate grossly normal gray-white differentiation. No mass effect or midline shift is seen. Vascular: No hyperdense vessel or unexpected calcification. Skull: There is no evidence of fracture; visualized osseous structures are unremarkable in appearance. Sinuses/Orbits: The visualized portions of the orbits are within normal limits.  The paranasal sinuses and mastoid air cells are well-aerated. Other: No significant soft tissue abnormalities are seen. IMPRESSION: 1. No acute intracranial pathology seen on CT. 2. Moderate cortical volume loss and diffuse small vessel ischemic microangiopathy. Electronically Signed   By: Garald Balding M.D.   On: 08/10/2016 00:02   Dg Chest Port 1 View  Result Date: 08/09/2016 CLINICAL DATA:  Cough for 24 hours.  Altered mental status tonight. EXAM: PORTABLE CHEST 1 VIEW COMPARISON:  10/23/2015 FINDINGS: Shallow inspiration. Mild cardiac enlargement. No pulmonary vascular congestion. No edema or consolidation in the lungs. No blunting of costophrenic angles. No pneumothorax. Calcified and tortuous aorta. IMPRESSION: Shallow inspiration. No evidence of active pulmonary disease. Mild cardiac enlargement. Tortuous aorta. Aortic atherosclerosis. Electronically Signed   By: Lucienne Capers M.D.   On: 08/09/2016 23:51    EKG: Independently reviewed. Rate 73, Qtc normal, diffuse repol abnormalities, old.    Assessment/Plan Active Problems:   Hypothyroidism   Essential hypertension   Seizure (HCC)   Protein-calorie malnutrition, severe ([place])   Dementia with behavioral disturbance  1. Seizure:  No evidence of precipitating factor, tother than advanced dementia.  No evidence of infection or electrolyte imbalance. -Seizure precautions -Continue levetiracetam 500 mg BID  oral -Consult to Neurology, appreciate cares -EEG ordered   2. HTN:  -Hold antihypertensives given hypotension after lorazepam, restart as able  3. Hypothyroidism:  -Continue levothyroxine  4. Dementia:  -Continue Aricept -Hold sertraline until more alert  5. Elevated lactic acid:  In context of 20 minute seizure.  Fluids given.  No evidence of infection at this time. -Repeat lactic acid  6. Other medications: -Continue carafate -Continue Ensure -Continue MiraLAX            DVT prophylaxis: Lovenox  Code Status: DO NOT RESUSCITATE  Family Communication: None present  Disposition Plan: Anticipate Keppra and Neuro consult and monitor for additional seizure activity.   Consults called: Neuro Admission status: INPATIENT, med surg         Medical decision making: Patient seen at 5:00 AM on 08/10/2016.  The patient was discussed with Dr. Tomi Bamberger and Dr. Leonel Ramsay.  What exists of the patient's chart was reviewed in depth and summarized above.  Clinical condition: stable.           Edwin Dada [facility] Pager [phone]      At the time of admission, it appears that the appropriate admission status for this patient is INPATIENT. This is judged to be reasonable and necessary in order to provide the required intensity of service to ensure the patient's safety given the presenting symptoms, physical exam findings, and initial radiographic and laboratory data in the context of their chronic comorbidities.  Together, these circumstances are felt to place her/him at high risk for further clinical deterioration threatening life, limb, or organ. The following factors support the admission status of inpatient:   A. The patient's presenting symptoms include aspiration type event, altered mental status B. The worrisome physical exam findings include seizure episode in ER with twitching and clonic jerking C. The initial radiographic and laboratory  data are worrisome because of elevated lactic acid D. The chronic co-morbidities include advanced dementia, HTN, chronic kidney disease, anemia E. Patient requires inpatient status due to high intensity of service, high risk for further deterioration and high frequency of surveillance required because of this acute illness that poses a threat to life or bodily function. F. I certify that at the point of admission it is my clinical  judgment that the patient will require inpatient hospital care spanning beyond 2 midnights from the point of admission and that early discharge would result in unnecessary risk of decompensation and readmission or threat to life, limb or bodily function.

## 2016-08-10 NOTE — Consult Note (Signed)
Reason for [place] Referring Physician: hospitalist  [name] is an 82 y.o. female.  HPI: Patient brought in for aspiration type episode.  However, she was in the ED and had an episode where she was right sided jerking and face twitching.  She was given Ativan 3.73m IV that eventually stopped the episode.  She was also given a dose of Keppra.  The patient remains unarousable and no further hx can be obtained from her.  Reportedly the patient has had seizure-type episodes in the past but has never been treated with AED.  Past Medical History:  Diagnosis Date  . Anxiety   . Arthritis    "joints" (12/02/2013)  . Behavior disturbance   . Cardiomyopathy   . Colon cancer ([facility]    daughter denis colon cancer only diverticulitis  . Depression   . Diverticulitis   . Diverticulosis of colon (without mention of hemorrhage) 07/2012  . GERD (gastroesophageal reflux disease)   . Glaucoma   . Herpes zoster   . History of blood transfusion    "related to diverticulitis" (12/02/2013)  . Hypertension   . Hyponatremia 07/2012   Na as low as 114.   .Marland KitchenHypothyroid   . Internal hemorrhoid 07/2012  . Lumbar back pain   . Myocardial infarction   . Protein calorie malnutrition ([place] 07/2012   BMI then: 16.5  . Psychosis   . Zenker diverticulum 07/2012   this and esophageal dysmotility on esophagram.     Past Surgical History:  Procedure Laterality Date  . APPENDECTOMY  2005   ruptured appendix  . CATARACT EXTRACTION W/ INTRAOCULAR LENS  IMPLANT, BILATERAL Bilateral   . COLONOSCOPY  07/19/2012   Procedure: COLONOSCOPY;  Surgeon: MLadene Artist MD,FACG;  Location: [facility];  Service: Endoscopy;  Laterality: N/A;  . GLAUCOMA SURGERY Right   . THYROIDECTOMY    . TUBAL LIGATION      Family History  Problem Relation Age of Onset  . Stroke Daughter   . Heart attack    . Dementia Sister   . Colon cancer Neg Hx     Social History:  reports that she has never smoked. She has  never used smokeless tobacco. She reports that she does not drink alcohol or use drugs.  Allergies: No Known Allergies  Medications: I have reviewed the patient's current medications.  Results for orders placed or performed during the hospital encounter of 08/09/16 (from the past 48 hour(s))  Comprehensive metabolic panel     Status: Abnormal   Collection Time: 08/10/16 12:07 AM  Result Value Ref Range   Sodium 141 135 - 145 mmol/L   Potassium 3.2 (L) 3.5 - 5.1 mmol/L   Chloride 104 101 - 111 mmol/L   CO2 28 22 - 32 mmol/L   Glucose, Bld 147 (H) 65 - 99 mg/dL   BUN 18 6 - 20 mg/dL   Creatinine, Ser 0.92 0.44 - 1.00 mg/dL   Calcium 8.7 (L) 8.9 - 10.3 mg/dL   Total Protein 6.1 (L) 6.5 - 8.1 g/dL   Albumin 3.3 (L) 3.5 - 5.0 g/dL   AST 20 15 - 41 U/L   ALT 9 (L) 14 - 54 U/L   Alkaline Phosphatase 60 38 - 126 U/L   Total Bilirubin 0.6 0.3 - 1.2 mg/dL   GFR calc non Af Amer 51 (L) >60 mL/min   GFR calc Af Amer 59 (L) >60 mL/min    Comment: (NOTE) The eGFR has been calculated using the CKD EPI  equation. This calculation has not been validated in all clinical situations. eGFR's persistently <60 mL/min signify possible Chronic Kidney Disease.    Anion gap 9 5 - 15  Troponin I     Status: None   Collection Time: 08/10/16 12:07 AM  Result Value Ref Range   Troponin I <0.03 <0.03 ng/mL  CBC with Differential     Status: Abnormal   Collection Time: 08/10/16 12:07 AM  Result Value Ref Range   WBC 4.7 4.0 - 10.5 K/uL   RBC 3.45 (L) 3.87 - 5.11 MIL/uL   Hemoglobin 9.5 (L) 12.0 - 15.0 g/dL   HCT 29.3 (L) 36.0 - 46.0 %   MCV 84.9 78.0 - 100.0 fL   MCH 27.5 26.0 - 34.0 pg   MCHC 32.4 30.0 - 36.0 g/dL   RDW 16.7 (H) 11.5 - 15.5 %   Platelets 191 150 - 400 K/uL   Neutrophils Relative % 69 %   Neutro Abs 3.3 1.7 - 7.7 K/uL   Lymphocytes Relative 22 %   Lymphs Abs 1.0 0.7 - 4.0 K/uL   Monocytes Relative 7 %   Monocytes Absolute 0.3 0.1 - 1.0 K/uL   Eosinophils Relative 1 %    Eosinophils Absolute 0.0 0.0 - 0.7 K/uL   Basophils Relative 1 %   Basophils Absolute 0.0 0.0 - 0.1 K/uL  Lactic acid, plasma     Status: None   Collection Time: 08/10/16 12:08 AM  Result Value Ref Range   Lactic Acid, Venous 1.9 0.5 - 1.9 mmol/L  Urinalysis, Routine w reflex microscopic     Status: Abnormal   Collection Time: 08/10/16 12:50 AM  Result Value Ref Range   Color, Urine YELLOW YELLOW   APPearance CLOUDY (A) CLEAR   Specific Gravity, Urine 1.011 1.005 - 1.030   pH 7.0 5.0 - 8.0   Glucose, UA NEGATIVE NEGATIVE mg/dL   Hgb urine dipstick NEGATIVE NEGATIVE   Bilirubin Urine NEGATIVE NEGATIVE   Ketones, ur NEGATIVE NEGATIVE mg/dL   Protein, ur 30 (A) NEGATIVE mg/dL   Nitrite NEGATIVE NEGATIVE   Leukocytes, UA NEGATIVE NEGATIVE  Urine microscopic-add on     Status: Abnormal   Collection Time: 08/10/16 12:50 AM  Result Value Ref Range   Squamous Epithelial / LPF 0-5 (A) NONE SEEN   WBC, UA 0-5 0 - 5 WBC/hpf   RBC / HPF NONE SEEN 0 - 5 RBC/hpf   Bacteria, UA NONE SEEN NONE SEEN  Lactic acid, plasma     Status: Abnormal   Collection Time: 08/10/16  2:43 AM  Result Value Ref Range   Lactic Acid, Venous 3.9 (HH) 0.5 - 1.9 mmol/L    Comment: CRITICAL RESULT CALLED TO, READ BACK BY AND VERIFIED WITH: G NASH RN [id number] 08/10/16 A NAVARRO   Lactic acid, plasma     Status: None   Collection Time: 08/10/16  7:13 AM  Result Value Ref Range   Lactic Acid, Venous 1.8 0.5 - 1.9 mmol/L    Ct Head Wo Contrast  Result Date: 08/10/2016 CLINICAL DATA:  Acute onset of vomiting and altered mental status. Initial encounter. EXAM: CT HEAD WITHOUT CONTRAST TECHNIQUE: Contiguous axial images were obtained from the base of the skull through the vertex without intravenous contrast. COMPARISON:  CT of the head performed April 07, 202016, and MRI of the brain from 03/08/2015 FINDINGS: Brain: No evidence of acute infarction, hemorrhage, hydrocephalus, extra-axial collection or mass lesion/mass effect.  Prominence of the ventricles and sulci reflects moderate cortical volume loss.  Cerebellar atrophy is noted. Diffuse periventricular and subcortical white matter change likely reflects small vessel ischemic microangiopathy. The brainstem and fourth ventricle are within normal limits. The basal ganglia are unremarkable in appearance. The cerebral hemispheres demonstrate grossly normal gray-white differentiation. No mass effect or midline shift is seen. Vascular: No hyperdense vessel or unexpected calcification. Skull: There is no evidence of fracture; visualized osseous structures are unremarkable in appearance. Sinuses/Orbits: The visualized portions of the orbits are within normal limits. The paranasal sinuses and mastoid air cells are well-aerated. Other: No significant soft tissue abnormalities are seen. IMPRESSION: 1. No acute intracranial pathology seen on CT. 2. Moderate cortical volume loss and diffuse small vessel ischemic microangiopathy. Electronically Signed   By: Garald Balding M.D.   On: 08/10/2016 00:02   Dg Chest Port 1 View  Result Date: 08/09/2016 CLINICAL DATA:  Cough for 24 hours.  Altered mental status tonight. EXAM: PORTABLE CHEST 1 VIEW COMPARISON:  10/23/2015 FINDINGS: Shallow inspiration. Mild cardiac enlargement. No pulmonary vascular congestion. No edema or consolidation in the lungs. No blunting of costophrenic angles. No pneumothorax. Calcified and tortuous aorta. IMPRESSION: Shallow inspiration. No evidence of active pulmonary disease. Mild cardiac enlargement. Tortuous aorta. Aortic atherosclerosis. Electronically Signed   By: Lucienne Capers M.D.   On: 08/09/2016 23:51    Review of Systems  Unable to perform ROS: Dementia     Blood pressure (!) 166/71, pulse (!) 58, temperature 97.8 F (36.6 C), temperature source Axillary, resp. rate 14, SpO2 98 %. Physical Exam MS - drowsy and difficult to arouse CN - pupils reactive Motor - not moving extremities to command Sensory  - unable to test DTR - symmetric Coor/station/gait - not tested  Assessment/Plan: 1. Possible seizure The episode sounds like a possible seizure with focal features but also with alteration in awareness.  Patient is awaiting EEG and MRI.  Would continue with Keppra 552m q12h for now.  Minimize use of sedating medications.  2. Dementia Continue with Aricept.  3. Multiple medical problems  SDoren Custard MD Neurology 08/10/2016, 10:42 AM

## 2016-08-10 NOTE — Progress Notes (Signed)
EEG completed, results pending. 

## 2016-08-10 NOTE — ED Notes (Addendum)
Pt's daughter's cell phone numbers: Charlett Nose: [phone] Linda: [phone]

## 2016-08-10 NOTE — ED Notes (Signed)
Notified Dr. Tomi Bamberger about critical value.

## 2016-08-10 NOTE — ED Notes (Signed)
Pt's BP currently 63/46.  A liter of saline was hanged and Dr. Tomi Bamberger made aware of pt's BP and lactic acid.  Dr. Tomi Bamberger gave verbal order for the liter of saline.  Pt's other VS stable.

## 2016-08-11 ENCOUNTER — Inpatient Hospital Stay (HOSPITAL_COMMUNITY): Payer: Commercial Managed Care - HMO

## 2016-08-11 NOTE — Progress Notes (Signed)
Subjective: Interval History: She is awake today but still not speaking.  Objective: Vital signs in last 24 hours: Temp:  [97.3 F (36.3 C)-97.4 F (36.3 C)] 97.4 F (36.3 C) (11/24 0624) Pulse Rate:  [48-56] 56 (11/24 0624) Resp:  [16-18] 18 (11/24 0624) BP: (128-176)/(71-98) 128/98 (11/24 0624) SpO2:  [100 %] 100 % (11/23 1405)  Intake/Output from previous day: 11/23 0701 - 11/24 0700 In: 115 [P.O.:10; IV Piggyback:105] Out: -  Intake/Output this shift: No intake/output data recorded. Nutritional status: Diet regular Room service appropriate? Yes; Fluid consistency: Thin  Neuro Exam MS - awake, alert, tracking but not speaking nor following commands CN - eyes are tracking Motor - reportedly moving spontaneously but not to command   Lab Results:  Recent Labs  08/10/16 0007  WBC 4.7  HGB 9.5*  HCT 29.3*  PLT 191  NA 141  K 3.2*  CL 104  CO2 28  GLUCOSE 147*  BUN 18  CREATININE 0.92  CALCIUM 8.7*   Lipid Panel No results for input(s): CHOL, TRIG, HDL, CHOLHDL, VLDL, LDLCALC in the last 72 hours.  Studies/Results: Ct Head Wo Contrast  Result Date: 08/10/2016 CLINICAL DATA:  Acute onset of vomiting and altered mental status. Initial encounter. EXAM: CT HEAD WITHOUT CONTRAST TECHNIQUE: Contiguous axial images were obtained from the base of the skull through the vertex without intravenous contrast. COMPARISON:  CT of the head performed 04-08-2015, and MRI of the brain from 03/08/2015 FINDINGS: Brain: No evidence of acute infarction, hemorrhage, hydrocephalus, extra-axial collection or mass lesion/mass effect. Prominence of the ventricles and sulci reflects moderate cortical volume loss. Cerebellar atrophy is noted. Diffuse periventricular and subcortical white matter change likely reflects small vessel ischemic microangiopathy. The brainstem and fourth ventricle are within normal limits. The basal ganglia are unremarkable in appearance. The cerebral hemispheres  demonstrate grossly normal gray-white differentiation. No mass effect or midline shift is seen. Vascular: No hyperdense vessel or unexpected calcification. Skull: There is no evidence of fracture; visualized osseous structures are unremarkable in appearance. Sinuses/Orbits: The visualized portions of the orbits are within normal limits. The paranasal sinuses and mastoid air cells are well-aerated. Other: No significant soft tissue abnormalities are seen. IMPRESSION: 1. No acute intracranial pathology seen on CT. 2. Moderate cortical volume loss and diffuse small vessel ischemic microangiopathy. Electronically Signed   By: Garald Balding M.D.   On: 08/10/2016 00:02   Dg Chest Port 1 View  Result Date: 08/09/2016 CLINICAL DATA:  Cough for 24 hours.  Altered mental status tonight. EXAM: PORTABLE CHEST 1 VIEW COMPARISON:  10/23/2015 FINDINGS: Shallow inspiration. Mild cardiac enlargement. No pulmonary vascular congestion. No edema or consolidation in the lungs. No blunting of costophrenic angles. No pneumothorax. Calcified and tortuous aorta. IMPRESSION: Shallow inspiration. No evidence of active pulmonary disease. Mild cardiac enlargement. Tortuous aorta. Aortic atherosclerosis. Electronically Signed   By: Lucienne Capers M.D.   On: 08/09/2016 23:51    Medications: I have reviewed the patient's current medications.  Assessment/Plan: 1. Seizure No further seizures noted.  EEG did not reveal any seizures.  MRI pending.  Continue Keppra.  2. Dementia    LOS: 1 day   Doren Custard, MD Neurology

## 2016-08-11 NOTE — Progress Notes (Signed)
[facility] PROGRESS NOTE  Malorey Hirshman [id number] DOB: 1917/03/30 DOA: 08/09/2016 PCP: Cathlean Cower, MD  Interim summary and HPI 82 y.o. female.  HPI: Patient brought in for aspiration type episode.  However, she was in the ED and had an episode where she was right sided jerking and face twitching.  She was given Ativan 3.5mg  IV that eventually stopped the episode.  She was also given a dose of Keppra. Reportedly the patient has had seizure-type episodes in the past but has never been treated with AED.  Assessment/Plan: 1. Seizure:  No evidence of infection or electrolyte imbalance. -Continue Seizure precautions -Continue levetiracetam 500 mg BID  -Neurology on board, appreciate care and rec's -MRI w/o acute stroke  -EEG neg for active seizure  2. HTN:  -BP stable overall -will continue holding to guaranteed good perfusion and minimize hypotension   3. Hypothyroidism:  -Continue levothyroxine  4. Dementia:  -Continue Aricept  5. Elevated lactic acid:  In context of 20 minute seizure and dehydration. - Fluids given.   -No evidence of infection at this time. -repeated lactic acid WNL  6. GERD -Continue carafate  7. Severe protein calorie malnutrition  -Continue Ensure     Code Status: DNR Family Communication: daughter at bedside  Disposition Plan: to be determine; patient currently with hard time eating and no close to baseline as per family reports. Might potentially ended requiring hospice at home.   Consultants:  Neurology   Procedures:  See below for x-ray reports   EEG: w/o active seizure activity   Antibiotics:  None   HPI/Subjective: Afebrile, in no distress. Patient unable to follow commands and with garbled speech  Objective: Vitals:   08/10/16 2216 08/11/16 0624  BP: (!) 176/86 (!) 128/98  Pulse: (!) 52 (!) 56  Resp: 16 18  Temp:  97.4 F (36.3 C)    Intake/Output Summary (Last 24 hours) at 08/11/16 1513 Last data  filed at 08/10/16 1857  Gross per 24 hour  Intake               10 ml  Output                0 ml  Net               10 ml   There were no vitals filed for this visit.  Exam:   General:  Old, frail and underweight. Awake, garbled speech with family, unable to follow commands. No fever. No further seizure seen.  Cardiovascular: S1 and S2, no rubs, no gallops  Respiratory: no wheezing, no crackles; positive scattered rhonchi   Abdomen: soft, NT, positive BS  Musculoskeletal: no edema, no cyanosis, no clubbing   Data Reviewed: Basic Metabolic Panel:  Recent Labs Lab 08/10/16 0007  NA 141  K 3.2*  CL 104  CO2 28  GLUCOSE 147*  BUN 18  CREATININE 0.92  CALCIUM 8.7*   Liver Function Tests:  Recent Labs Lab 08/10/16 0007  AST 20  ALT 9*  ALKPHOS 60  BILITOT 0.6  PROT 6.1*  ALBUMIN 3.3*   CBC:  Recent Labs Lab 08/10/16 0007  WBC 4.7  NEUTROABS 3.3  HGB 9.5*  HCT 29.3*  MCV 84.9  PLT 191   Cardiac Enzymes:  Recent Labs Lab 08/10/16 0007  TROPONINI <0.03   CBG:  Recent Labs Lab 08/10/16 2143  GLUCAP 80    Recent Results (from the past 240 hour(s))  Culture, blood (routine x 2)  Status: None (Preliminary result)   Collection Time: 08/10/16 12:08 AM  Result Value Ref Range Status   Specimen Description BLOOD RIGHT ANTECUBITAL  Final   Special Requests BOTTLES DRAWN AEROBIC AND ANAEROBIC 5 CC  Final   Culture   Final    NO GROWTH < 12 HOURS Performed at [facility]    Report Status PENDING  Incomplete  Culture, blood (routine x 2)     Status: None (Preliminary result)   Collection Time: 08/10/16 12:10 AM  Result Value Ref Range Status   Specimen Description BLOOD LEFT ANTECUBITAL  Final   Special Requests BOTTLES DRAWN AEROBIC AND ANAEROBIC 5 CC  Final   Culture   Final    NO GROWTH < 12 HOURS Performed at [facility]    Report Status PENDING  Incomplete     Studies: Ct Head Wo Contrast  Result Date:  08/10/2016 CLINICAL DATA:  Acute onset of vomiting and altered mental status. Initial encounter. EXAM: CT HEAD WITHOUT CONTRAST TECHNIQUE: Contiguous axial images were obtained from the base of the skull through the vertex without intravenous contrast. COMPARISON:  CT of the head performed November 12, 202016, and MRI of the brain from 03/08/2015 FINDINGS: Brain: No evidence of acute infarction, hemorrhage, hydrocephalus, extra-axial collection or mass lesion/mass effect. Prominence of the ventricles and sulci reflects moderate cortical volume loss. Cerebellar atrophy is noted. Diffuse periventricular and subcortical white matter change likely reflects small vessel ischemic microangiopathy. The brainstem and fourth ventricle are within normal limits. The basal ganglia are unremarkable in appearance. The cerebral hemispheres demonstrate grossly normal gray-white differentiation. No mass effect or midline shift is seen. Vascular: No hyperdense vessel or unexpected calcification. Skull: There is no evidence of fracture; visualized osseous structures are unremarkable in appearance. Sinuses/Orbits: The visualized portions of the orbits are within normal limits. The paranasal sinuses and mastoid air cells are well-aerated. Other: No significant soft tissue abnormalities are seen. IMPRESSION: 1. No acute intracranial pathology seen on CT. 2. Moderate cortical volume loss and diffuse small vessel ischemic microangiopathy. Electronically Signed   By: Garald Balding M.D.   On: 08/10/2016 00:02   Mr Brain Wo Contrast  Result Date: 08/11/2016 CLINICAL DATA:  82 year old female with possible seizure activity. Symptoms since yesterday afternoon. Dementia. Initial encounter. EXAM: MRI HEAD WITHOUT CONTRAST TECHNIQUE: Multiplanar, multiecho pulse sequences of the brain and surrounding structures were obtained without intravenous contrast. COMPARISON:  Head CT without contrast 08/09/2016. Brain MRI 03/08/2015. FINDINGS: Brain: No  restricted diffusion to suggest acute infarction. No midline shift, mass effect, evidence of mass lesion, ventriculomegaly, extra-axial collection or acute intracranial hemorrhage. Cervicomedullary junction and pituitary are within normal limits. Generalized cerebral volume loss. Patchy and confluent chronic cerebral white matter T2 and FLAIR hyperintensity, and chronic deep gray matter T2 heterogeneity, especially in the medial thalami. Relative sparing of the brainstem and cerebellum. [place] Cables and white matter signal not significantly changed since 2016. No cortical encephalomalacia or chronic cerebral blood products identified. Vascular: Major intracranial vascular flow voids are stable since 2016 and appear normal except for the appearance of the left sigmoid sinus and superior left IJ. There is increased signal in the left sigmoid sinus and left IJ bulb, an the visible superior left IJ appears more diminutive than before. Skull and upper cervical spine: Negative. Normal bone marrow signal. Sinuses/Orbits: Stable and negative with postoperative changes to the left globe. Visualized paranasal sinuses and mastoids are stable and well pneumatized. Other: Visible internal auditory structures appear normal. Negative scalp soft  tissues. IMPRESSION: 1. Possible abnormally slow flow in, or or occlusion of, the left sigmoid sinus and visible left IJ which appears new since 2016. While this might be artifact, consider left IJ stenosis or thrombosis - especially if a left neck central line has been placed. Doppler ultrasound of the neck or Neck CT with IV contrast (CT NOT CTA) would evaluate further. 2. Otherwise stable noncontrast MRI appearance of the brain since 2016 with no other acute intracranial abnormality. Electronically Signed   By: Genevie Ann M.D.   On: 08/11/2016 14:13   Dg Chest Port 1 View  Result Date: 08/09/2016 CLINICAL DATA:  Cough for 24 hours.  Altered mental status tonight. EXAM: PORTABLE CHEST 1 VIEW  COMPARISON:  10/23/2015 FINDINGS: Shallow inspiration. Mild cardiac enlargement. No pulmonary vascular congestion. No edema or consolidation in the lungs. No blunting of costophrenic angles. No pneumothorax. Calcified and tortuous aorta. IMPRESSION: Shallow inspiration. No evidence of active pulmonary disease. Mild cardiac enlargement. Tortuous aorta. Aortic atherosclerosis. Electronically Signed   By: Lucienne Capers M.D.   On: 08/09/2016 23:51    Scheduled Meds: . donepezil  5 mg Oral QHS  . enoxaparin (LOVENOX) injection  40 mg Subcutaneous Q24H  . feeding supplement (ENSURE ENLIVE)  237 mL Oral TID BM  . levETIRAcetam  500 mg Intravenous Q12H  . levothyroxine  88 mcg Oral QAC breakfast  . polyethylene glycol  17 g Oral Daily  . sucralfate  1 g Oral TID WC & HS   Continuous Infusions:  Active Problems:   Hypothyroidism   Essential hypertension   Seizure ([place])   Protein-calorie malnutrition, severe ([place])   Dementia with behavioral disturbance    Time spent: 30 minutes    Barton Dubois  [facility] Pager [phone]. If 7PM-7AM, please contact night-coverage at [email or web address], password [facility] 08/11/2016, 3:13 PM  LOS: 1 day

## 2016-08-11 NOTE — Procedures (Signed)
EEG REPORT  CLINICAL HISTORY This is a 82yo F who presented with probable new onset seizures.  She has a h/o dementia.  She has been started on Keppra.  TECHNIQUE This is routine beside EEG done with standard technique.  The patient was noted to be drowsy throughout the procedure.  Activating procedures were not done.  INTERPRETATION Background activity shows 7hz  frequency with low amplitude.  The background is disorganized but symmetric.  Note was made of artifact from movement, muscle and noise.  There was evidence for any epileptiform activity.  IMPRESSION This is an abnormal EEG due to mild generalized slowing.  CLINICAL CORRELATION This finding is non-specific but is suggestive of a mild encephalopathy.  It can also be seen in dementia.  However, there is nothing to suggest any seizure activity.  Valene Bors, MD Neurology

## 2016-08-12 DIAGNOSIS — E872 Acidosis: Secondary | ICD-10-CM

## 2016-08-12 NOTE — Progress Notes (Signed)
Daughter updated re: patient's condition today.

## 2016-08-12 NOTE — Progress Notes (Signed)
[facility] PROGRESS NOTE  Sameena Corriher [id number] DOB: 10/25/1916 DOA: 08/09/2016 PCP: Cathlean Cower, MD  Interim summary and HPI 82 y.o. female.  HPI: Patient brought in for aspiration type episode.  However, she was in the ED and had an episode where she was right sided jerking and face twitching.  She was given Ativan 3.5mg  IV that eventually stopped the episode.  She was also given a dose of Keppra. Reportedly the patient has had seizure-type episodes in the past but has never been treated with AED.  Assessment/Plan: 1. Seizure:  No evidence of infection or electrolyte imbalance. -Continue Seizure precautions -Continue levetiracetam 500 mg BID  -Neurology on board, appreciate care and rec's -MRI w/o acute stroke  -EEG neg for active seizure -hopefully home soon -she is now waking up properly and appears to be recovering from post-ictal state and use of benzos.  2. HTN:  -BP stable overall -will continue holding to guaranteed good perfusion and minimize hypotension   3. Hypothyroidism:  -Continue levothyroxine  4. Dementia:  -Continue Aricept  5. Elevated lactic acid:  In context of 20 minute seizure and dehydration. - Fluids given.   -No evidence of infection at this time. -repeated lactic acid WNL  6. GERD -Continue carafate  7. Severe protein calorie malnutrition  -Continue Ensure  Code Status: DNR Family Communication: daughter at bedside  Disposition Plan: to be determine; patient improving and more alert/oriented. Will monitor for an extra 24 hours and if she remains stable will discharge with oral AED's and instructions to pursuit home hospice.   Consultants:  Neurology   Procedures:  See below for x-ray reports   EEG: w/o active seizure activity   Antibiotics:  None   HPI/Subjective: Afebrile, in no distress. Patient is more alert today, tracking, answering some questions and following simple commands. No further seizures  appreciated.  Objective: Vitals:   08/11/16 2051 08/12/16 0554  BP: (!) 167/89 (!) 113/59  Pulse: (!) 103 87  Resp: 20 16  Temp: 98 F (36.7 C) 97.9 F (36.6 C)    Intake/Output Summary (Last 24 hours) at 08/12/16 1434 Last data filed at 08/12/16 1407  Gross per 24 hour  Intake             1044 ml  Output                0 ml  Net             1044 ml   There were no vitals filed for this visit.  Exam:   General:  Old, frail and underweight. Awake, speaking more clear now, patient following simple commands and able to track during conversation. No fever. No further seizure appreciated.  Cardiovascular: S1 and S2, no rubs, no gallops  Respiratory: no wheezing, no crackles; positive scattered rhonchi   Abdomen: soft, NT, positive BS  Musculoskeletal: no edema, no cyanosis, no clubbing   Data Reviewed: Basic Metabolic Panel:  Recent Labs Lab 08/10/16 0007  NA 141  K 3.2*  CL 104  CO2 28  GLUCOSE 147*  BUN 18  CREATININE 0.92  CALCIUM 8.7*   Liver Function Tests:  Recent Labs Lab 08/10/16 0007  AST 20  ALT 9*  ALKPHOS 60  BILITOT 0.6  PROT 6.1*  ALBUMIN 3.3*   CBC:  Recent Labs Lab 08/10/16 0007  WBC 4.7  NEUTROABS 3.3  HGB 9.5*  HCT 29.3*  MCV 84.9  PLT 191   Cardiac Enzymes:  Recent Labs Lab  08/10/16 0007  TROPONINI <0.03   CBG:  Recent Labs Lab 08/10/16 2143  GLUCAP 80    Recent Results (from the past 240 hour(s))  Culture, blood (routine x 2)     Status: None (Preliminary result)   Collection Time: 08/10/16 12:08 AM  Result Value Ref Range Status   Specimen Description BLOOD RIGHT ANTECUBITAL  Final   Special Requests BOTTLES DRAWN AEROBIC AND ANAEROBIC 5 CC  Final   Culture   Final    NO GROWTH 1 DAY Performed at [facility]    Report Status PENDING  Incomplete  Culture, blood (routine x 2)     Status: None (Preliminary result)   Collection Time: 08/10/16 12:10 AM  Result Value Ref Range Status   Specimen  Description BLOOD LEFT ANTECUBITAL  Final   Special Requests BOTTLES DRAWN AEROBIC AND ANAEROBIC 5 CC  Final   Culture   Final    NO GROWTH 1 DAY Performed at [facility]    Report Status PENDING  Incomplete     Studies: Mr Brain 22 Contrast  Result Date: 08/11/2016 CLINICAL DATA:  82 year old female with possible seizure activity. Symptoms since yesterday afternoon. Dementia. Initial encounter. EXAM: MRI HEAD WITHOUT CONTRAST TECHNIQUE: Multiplanar, multiecho pulse sequences of the brain and surrounding structures were obtained without intravenous contrast. COMPARISON:  Head CT without contrast 08/09/2016. Brain MRI 03/08/2015. FINDINGS: Brain: No restricted diffusion to suggest acute infarction. No midline shift, mass effect, evidence of mass lesion, ventriculomegaly, extra-axial collection or acute intracranial hemorrhage. Cervicomedullary junction and pituitary are within normal limits. Generalized cerebral volume loss. Patchy and confluent chronic cerebral white matter T2 and FLAIR hyperintensity, and chronic deep gray matter T2 heterogeneity, especially in the medial thalami. Relative sparing of the brainstem and cerebellum. [place] Cables and white matter signal not significantly changed since 2016. No cortical encephalomalacia or chronic cerebral blood products identified. Vascular: Major intracranial vascular flow voids are stable since 2016 and appear normal except for the appearance of the left sigmoid sinus and superior left IJ. There is increased signal in the left sigmoid sinus and left IJ bulb, an the visible superior left IJ appears more diminutive than before. Skull and upper cervical spine: Negative. Normal bone marrow signal. Sinuses/Orbits: Stable and negative with postoperative changes to the left globe. Visualized paranasal sinuses and mastoids are stable and well pneumatized. Other: Visible internal auditory structures appear normal. Negative scalp soft tissues. IMPRESSION: 1.  Possible abnormally slow flow in, or or occlusion of, the left sigmoid sinus and visible left IJ which appears new since 2016. While this might be artifact, consider left IJ stenosis or thrombosis - especially if a left neck central line has been placed. Doppler ultrasound of the neck or Neck CT with IV contrast (CT NOT CTA) would evaluate further. 2. Otherwise stable noncontrast MRI appearance of the brain since 2016 with no other acute intracranial abnormality. Electronically Signed   By: Genevie Ann M.D.   On: 08/11/2016 14:13    Scheduled Meds: . donepezil  5 mg Oral QHS  . enoxaparin (LOVENOX) injection  40 mg Subcutaneous Q24H  . feeding supplement (ENSURE ENLIVE)  237 mL Oral TID BM  . levETIRAcetam  500 mg Intravenous Q12H  . levothyroxine  88 mcg Oral QAC breakfast  . polyethylene glycol  17 g Oral Daily  . sucralfate  1 g Oral TID WC & HS   Continuous Infusions:  Active Problems:   Hypothyroidism   Essential hypertension   Seizure ([place])  Protein-calorie malnutrition, severe ([place])   Dementia with behavioral disturbance    Time spent: 30 minutes    Barton Dubois  [facility] Pager [phone]. If 7PM-7AM, please contact night-coverage at [email or web address], password [facility] 08/12/2016, 2:34 PM  LOS: 2 days

## 2016-08-13 ENCOUNTER — Encounter (HOSPITAL_COMMUNITY): Payer: Self-pay | Admitting: *Deleted

## 2016-08-13 DIAGNOSIS — E876 Hypokalemia: Secondary | ICD-10-CM

## 2016-08-13 LAB — BASIC METABOLIC PANEL
Anion gap: 8 (ref 5–15)
BUN: 28 mg/dL — AB (ref 6–20)
CHLORIDE: 107 mmol/L (ref 101–111)
CO2: 31 mmol/L (ref 22–32)
Calcium: 8.7 mg/dL — ABNORMAL LOW (ref 8.9–10.3)
Creatinine, Ser: 0.92 mg/dL (ref 0.44–1.00)
GFR calc Af Amer: 59 mL/min — ABNORMAL LOW (ref >60)
GFR calc non Af Amer: 51 mL/min — ABNORMAL LOW (ref >60)
GLUCOSE: 100 mg/dL — AB (ref 65–99)
POTASSIUM: 2.5 mmol/L — AB (ref 3.5–5.1)
Sodium: 146 mmol/L — ABNORMAL HIGH (ref 135–145)

## 2016-08-13 LAB — MAGNESIUM: Magnesium: 1.3 mg/dL — ABNORMAL LOW (ref 1.7–2.4)

## 2016-08-13 MED ORDER — SODIUM CHLORIDE 0.9 % IV SOLN
30.0000 meq | INTRAVENOUS | Status: AC
  Administered 2016-08-13 (×2): 30 meq via INTRAVENOUS
  Filled 2016-08-13 (×2): qty 15

## 2016-08-13 MED ORDER — MAGNESIUM SULFATE 2 GM/50ML IV SOLN
2.0000 g | Freq: Once | INTRAVENOUS | Status: AC
  Administered 2016-08-13: 2 g via INTRAVENOUS
  Filled 2016-08-13: qty 50

## 2016-08-13 MED ORDER — ENOXAPARIN SODIUM 30 MG/0.3ML ~~LOC~~ SOLN
30.0000 mg | SUBCUTANEOUS | Status: DC
  Administered 2016-08-14: 30 mg via SUBCUTANEOUS
  Filled 2016-08-13: qty 0.3

## 2016-08-13 NOTE — Progress Notes (Signed)
CRITICAL VALUE ALERT  Critical value received:  Potassium 2.5  Date of notification:  08/13/16  Time of notification:  0506  Critical value read back:Yes.    Nurse who received alert:  Hortencia Conradi RN   MD notified (1st page):  Rai MD  Time of first page:  0507  MD notified (2nd page):  Time of second page:  Responding MD:  Rai MD  Time MD responded:

## 2016-08-13 NOTE — Progress Notes (Signed)
[facility] PROGRESS NOTE  [name] [id number] DOB: 07/08/1917 DOA: 08/09/2016 PCP: Cathlean Cower, MD  Interim summary and HPI 82 y.o. female.  HPI: Patient brought in for aspiration type episode.  However, she was in the ED and had an episode where she was right sided jerking and face twitching.  She was given Ativan 3.5mg  IV that eventually stopped the episode.  She was also given a dose of Keppra. Reportedly the patient has had seizure-type episodes in the past but has never been treated with AED.  Assessment/Plan: 1. Seizure:  No evidence of infection or electrolyte imbalance. -Continue Seizure precautions -Continue levetiracetam 500 mg BID  -Neurology on board, appreciate care and rec's -MRI w/o acute stroke  -EEG neg for active seizure -hopefully home soon -she is now waking up properly and appears to be recovering from post-ictal state and use of benzos given on admission.  2. HTN:  -BP stable overall -will continue holding to guaranteed good perfusion and minimize hypotension   3. Hypothyroidism:  -Continue levothyroxine  4. Dementia:  -Continue Aricept  5. Elevated lactic acid:  In context of 20 minute seizure and dehydration. -resolved with IV Fluids given.   -No evidence of infection at this time. -repeated lactic acid WNL  6. GERD -Continue carafate  7. Severe protein calorie malnutrition  -Continue Ensure  8. hypokalemia and hypomagnesemia  -will replete as needed and follow trend -associated with poor PO intake   Code Status: DNR Family Communication: daughter at bedside  Disposition Plan: to be determine; patient improving and more alert/oriented overall. Will need electrolytes repletion and extra monitoring for an extra 24-48 hours and if she remains stable will discharge with oral AED's and instructions to pursuit home hospice.   Consultants:  Neurology   Procedures:  See below for x-ray reports   EEG: w/o active seizure  activity   Antibiotics:  None   HPI/Subjective: Afebrile, in no distress. Patient mentation has remained improved. Weak and not eating much. Electrolytes are out of wack today.  Objective: Vitals:   08/12/16 2227 08/13/16 0609  BP: 138/71 (!) 146/74  Pulse: 72 64  Resp: 16 16  Temp: 98 F (36.7 C) 97.9 F (36.6 C)    Intake/Output Summary (Last 24 hours) at 08/13/16 1400 Last data filed at 08/13/16 1336  Gross per 24 hour  Intake              920 ml  Output                0 ml  Net              920 ml   There were no vitals filed for this visit.  Exam:   General:  Old, frail and underweight. Awake, speaking more clear now, patient following simple commands and able to track during conversation. No fever. No further seizure appreciated. Weak and with poor oral intake.  Cardiovascular: S1 and S2, no rubs, no gallops  Respiratory: no wheezing, no crackles; positive scattered rhonchi   Abdomen: soft, NT, positive BS  Musculoskeletal: no edema, no cyanosis, no clubbing   Data Reviewed: Basic Metabolic Panel:  Recent Labs Lab 08/10/16 0007 08/13/16 0401 08/13/16 0629  NA 141 146*  --   K 3.2* 2.5*  --   CL 104 107  --   CO2 28 31  --   GLUCOSE 147* 100*  --   BUN 18 28*  --   CREATININE 0.92 0.92  --  CALCIUM 8.7* 8.7*  --   MG  --   --  1.3*   Liver Function Tests:  Recent Labs Lab 08/10/16 0007  AST 20  ALT 9*  ALKPHOS 60  BILITOT 0.6  PROT 6.1*  ALBUMIN 3.3*   CBC:  Recent Labs Lab 08/10/16 0007  WBC 4.7  NEUTROABS 3.3  HGB 9.5*  HCT 29.3*  MCV 84.9  PLT 191   Cardiac Enzymes:  Recent Labs Lab 08/10/16 0007  TROPONINI <0.03   CBG:  Recent Labs Lab 08/10/16 2143  GLUCAP 80    Recent Results (from the past 240 hour(s))  Culture, blood (routine x 2)     Status: None (Preliminary result)   Collection Time: 08/10/16 12:08 AM  Result Value Ref Range Status   Specimen Description BLOOD RIGHT ANTECUBITAL  Final   Special  Requests BOTTLES DRAWN AEROBIC AND ANAEROBIC 5 CC  Final   Culture   Final    NO GROWTH 3 DAYS Performed at [facility]    Report Status PENDING  Incomplete  Culture, blood (routine x 2)     Status: None (Preliminary result)   Collection Time: 08/10/16 12:10 AM  Result Value Ref Range Status   Specimen Description BLOOD LEFT ANTECUBITAL  Final   Special Requests BOTTLES DRAWN AEROBIC AND ANAEROBIC 5 CC  Final   Culture   Final    NO GROWTH 3 DAYS Performed at [facility]    Report Status PENDING  Incomplete     Studies: No results found.  Scheduled Meds: . donepezil  5 mg Oral QHS  . [START ON 08/14/2016] enoxaparin (LOVENOX) injection  30 mg Subcutaneous Q24H  . feeding supplement (ENSURE ENLIVE)  237 mL Oral TID BM  . levETIRAcetam  500 mg Intravenous Q12H  . levothyroxine  88 mcg Oral QAC breakfast  . polyethylene glycol  17 g Oral Daily  . sucralfate  1 g Oral TID WC & HS   Continuous Infusions:  Active Problems:   Hypothyroidism   Essential hypertension   Seizure ([place])   Protein-calorie malnutrition, severe ([place])   Dementia with behavioral disturbance    Time spent: 25 minutes    Barton Dubois  [facility] Pager [phone]. If 7PM-7AM, please contact night-coverage at [email or web address], password [email or web address] 08/13/2016, 2:00 PM  LOS: 3 days

## 2016-08-14 DIAGNOSIS — R569 Unspecified convulsions: Secondary | ICD-10-CM

## 2016-08-14 DIAGNOSIS — I10 Essential (primary) hypertension: Secondary | ICD-10-CM

## 2016-08-14 DIAGNOSIS — F0391 Unspecified dementia with behavioral disturbance: Secondary | ICD-10-CM

## 2016-08-14 LAB — BASIC METABOLIC PANEL
Anion gap: 6 (ref 5–15)
BUN: 23 mg/dL — ABNORMAL HIGH (ref 6–20)
CALCIUM: 8.6 mg/dL — AB (ref 8.9–10.3)
CO2: 29 mmol/L (ref 22–32)
CREATININE: 0.74 mg/dL (ref 0.44–1.00)
Chloride: 111 mmol/L (ref 101–111)
GFR calc non Af Amer: >60 mL/min (ref >60)
Glucose, Bld: 105 mg/dL — ABNORMAL HIGH (ref 65–99)
Potassium: 3.7 mmol/L (ref 3.5–5.1)
SODIUM: 146 mmol/L — AB (ref 135–145)

## 2016-08-14 LAB — MAGNESIUM: MAGNESIUM: 2.1 mg/dL (ref 1.7–2.4)

## 2016-08-14 MED ORDER — LEVETIRACETAM 500 MG PO TABS: 500.0000 mg | ORAL_TABLET | Freq: Two times a day (BID) | ORAL | 0 refills | Status: DC

## 2016-08-14 MED ORDER — LEVETIRACETAM 500 MG PO TABS
500.0000 mg | ORAL_TABLET | Freq: Two times a day (BID) | ORAL | Status: DC
  Administered 2016-08-14 – 2016-08-15 (×2): 500 mg via ORAL
  Filled 2016-08-14 (×2): qty 1

## 2016-08-14 NOTE — Progress Notes (Signed)
CSW consulted for SNF placement. Pt has dementia and is unable to assist with d/c planning. CSW spoke with pt's daughter, Alvie Heidelberg [id number] this am to review PT recommendations. Daughter plans for d/c home when pt is stable. Pt has CAP services ( CNA 9a-5p week days, and assistance on weekends as well. Dau lives with pt and assists with care. Dau has requested hospital bed. RNCM has been alerted and will assist with d/c planning.  Werner Lean LCSW [phone]

## 2016-08-14 NOTE — Progress Notes (Signed)
Pt has been discharged, she is going home with daughter where she lived prior to admission. Appetite is good. Pt is incontinent, foul smelling urine. Discharge instructions given to the daughter. O2 sat without O2 is 75%. Dr. Carolin Sicks called and he ordered home O2 @ 2l.

## 2016-08-14 NOTE — Care Management Important Message (Signed)
Important Message  Patient Details  Name: [name] MRN: [id number] Date of Birth: 04/24/1917   Medicare Important Message Given:  Yes    Camillo Flaming 08/14/2016, 9:34 AMImportant Message  Patient Details  Name: [name] MRN: [id number] Date of Birth: 08/25/1917   Medicare Important Message Given:  Yes    Camillo Flaming 08/14/2016, 9:34 AM

## 2016-08-14 NOTE — Progress Notes (Signed)
Pt was discharged, daughter in to take her home- O2 removed and her Sat dropped to 75%. Dr. Carolin Sicks called and informed . He ordered Home O2. Case manager called at [facility], asked her to arrange for O2 to be delivered to pt's room. She was unable to arranged that due to it being too late. Pt will remain another night in the hospital and O2 will be delivered in AM. Daughter will be in at 1500 to pick her mother up.

## 2016-08-14 NOTE — Discharge Summary (Addendum)
Physician Discharge Summary  [name] [id number] DOB: December 13, 1916 DOA: 08/09/2016  PCP: Cathlean Cower, MD  Admit date: 08/09/2016 Discharge date: 08/15/2016  Admitted From:Home Disposition:home with home care. Family declined SNF.   Recommendations for Outpatient Follow-up:  1. Follow up with PCP in 1-2 weeks  Home Health:Yes Equipment/Devices: Yes including hospital. Discharge Condition: Stable CODE STATUS: DO NOT RESUSCITATE Diet recommendation: Dysphagia level II  Brief/Interim Summary:82 y.o. female with a past medical history significant for advanced dementia, hypothyroidism, HTN, CKD stage III and chronic anemia who presents with aspiration episode. In the ED and had an episode where she was right sided jerking and face twitching. She was given Ativan 3.5mg  IV that eventually stopped the episode. She was also given a dose of Keppra. Reportedly the patient has had seizure-type episodes in the past but has never been treated with AED.  Patient was evaluated by a neurologist. MRI without any acute stroke. EEG negative for active seizure. On Keppra 500 mg twice a day. No further episode of seizure in the hospital. Evaluated by PT OT recommended 24-hour supervision or SNF. Family declined SNF. As per case manager, patient has home care and family provides care in the night. Family wants to take patient home. Unable to obtain history or review of system for the patient because of dementia.I discussed with the case manager and social worker regarding discharge planning in detail.  Continued home medication for hypertension, hypothyroidism, dementia on discharge.  Patient developed hypokalemia and hypomagnesemia in the hospital likely due to poor oral intake.Repeat labs improved.  Patient has severe protein calorie malnutrition. Encourage oral intake. Currently patient is being discharged home as per family's request.  Addendum: Pt was hypoxic yesterday likely due to problem with the  sensor/equipment vs cold extremities. Repeat CXR unchanged. Today, o2 sat 100 % in room air. Will dc oxygen on discharge. Pt is DNR and palliative care referral outpatient. Discharge Diagnoses:  Active Problems:   Hypothyroidism   Essential hypertension   Seizure (HCC)   Protein-calorie malnutrition, severe (HCC)   Dementia with behavioral disturbance    Discharge Instructions  Discharge Instructions    Amb Referral to Palliative Care    Complete by:  As directed    Call MD for:  difficulty breathing, headache or visual disturbances    Complete by:  As directed    Call MD for:  hives    Complete by:  As directed    Call MD for:  severe uncontrolled pain    Complete by:  As directed    Call MD for:  temperature >100.4    Complete by:  As directed    Diet - low sodium heart healthy    Complete by:  As directed    Dysphasia level 2 diet   Discharge instructions    Complete by:  As directed    Dysphasia diet   Increase activity slowly    Complete by:  As directed         Medication List    TAKE these medications   acetaminophen 500 MG tablet Commonly known as:  TYLENOL Take 500 mg by mouth every 6 (six) hours as needed for moderate pain.   CALTRATE 600+D PLUS MINERALS 600-800 MG-UNIT Tabs Take 1 tablet by mouth daily.   CARAFATE 1 GM/10ML suspension Generic drug:  sucralfate TAKE 10 MLS (1 G TOTAL) BY MOUTH 4 (FOUR) TIMES DAILY - WITH MEALS AND AT BEDTIME.   [employer] VITAMIN B12 1000 MCG tablet Generic drug:  cyanocobalamin TAKE 1 TABLET (1,000 MCG TOTAL) BY MOUTH DAILY.   donepezil 5 MG tablet Commonly known as:  ARICEPT Take 1 tablet (5 mg total) by mouth at bedtime.   feeding supplement (ENSURE ENLIVE) Liqd Take 237 mLs by mouth 3 (three) times daily between meals.   isosorbide mononitrate 30 MG 24 hr tablet Commonly known as:  IMDUR TAKE 1 TABLET (30 MG TOTAL) BY MOUTH DAILY.   levETIRAcetam 100 MG/ML solution Commonly known as:  KEPPRA Take 5 mLs (500 mg  total) by mouth 2 (two) times daily.   levothyroxine 88 MCG tablet Commonly known as:  SYNTHROID, LEVOTHROID TAKE 1 TABLET (88 MCG TOTAL) BY MOUTH DAILY BEFORE BREAKFAST.   lisinopril 10 MG tablet Commonly known as:  PRINIVIL,ZESTRIL Take 1 tablet (10 mg total) by mouth daily.   nitroGLYCERIN 0.4 MG SL tablet Commonly known as:  NITROSTAT Place 1 tablet (0.4 mg total) under the tongue every 5 (five) minutes as needed. Chest pains   polyethylene glycol powder powder Commonly known as:  GLYCOLAX/MIRALAX TAKE 17 G BY MOUTH DAILY.   sertraline 25 MG tablet Commonly known as:  ZOLOFT TAKE 1 TABLET (25 MG TOTAL) BY MOUTH DAILY.         Follow-up Information    Cathlean Cower, MD. Schedule an appointment as soon as possible for a visit in 1 week(s).   Specialties:  Internal Medicine, Radiology Contact information: [place] [address] [phone]        Doren Custard, MD Follow up.   Specialty:  Neurology Why:  as needed for seizure. Contact information: [place] [address] [phone]          No Known Allergies  Consultations:Neurologist   Procedures/Studies: None   Subjective: Patient was seen and examined at bedside. Patient was alert awake but not oriented. Denied pain. Review of system unreliable because of her dementia.   Discharge Exam: Vitals:   08/15/16 0611 08/15/16 1300  BP: 125/89 (!) 144/65  Pulse:  68  Resp:  16  Temp:  99 F (37.2 C)   Vitals:   08/15/16 0457 08/15/16 0611 08/15/16 1300 08/15/16 1400  BP: (!) 155/32 125/89 (!) 144/65   Pulse: (!) 108  68   Resp: 16  16   Temp: 98 F (36.7 C)  99 F (37.2 C)   TempSrc: Axillary  Oral   SpO2: 92%  100% 100%    General: Elderly female looks comfortable. Has severe dementia Cardiovascular: RRR, S1/S2 +, no rubs, no gallops Respiratory: CTA bilaterally, no wheezing, no rhonchi Abdominal: Soft, NT, ND, bowel sounds + Extremities: no  edema, no cyanosis    The results of significant diagnostics from this hospitalization (including imaging, microbiology, ancillary and laboratory) are listed below for reference.     Microbiology: Recent Results (from the past 240 hour(s))  Culture, blood (routine x 2)     Status: None   Collection Time: 08/10/16 12:08 AM  Result Value Ref Range Status   Specimen Description BLOOD RIGHT ANTECUBITAL  Final   Special Requests BOTTLES DRAWN AEROBIC AND ANAEROBIC 5 CC  Final   Culture   Final    NO GROWTH 5 DAYS Performed at [facility]    Report Status 08/15/2016 FINAL  Final  Culture, blood (routine x 2)     Status: None   Collection Time: 08/10/16 12:10 AM  Result Value Ref Range Status   Specimen Description BLOOD LEFT  ANTECUBITAL  Final   Special Requests BOTTLES DRAWN AEROBIC AND ANAEROBIC 5 CC  Final   Culture   Final    NO GROWTH 5 DAYS Performed at [facility]    Report Status 08/15/2016 FINAL  Final     Labs: BNP (last 3 results) No results for input(s): BNP in the last 8760 hours. Basic Metabolic Panel:  Recent Labs Lab 08/10/16 0007 08/13/16 0401 08/13/16 0629 08/14/16 0340  NA 141 146*  --  146*  K 3.2* 2.5*  --  3.7  CL 104 107  --  111  CO2 28 31  --  29  GLUCOSE 147* 100*  --  105*  BUN 18 28*  --  23*  CREATININE 0.92 0.92  --  0.74  CALCIUM 8.7* 8.7*  --  8.6*  MG  --   --  1.3* 2.1   Liver Function Tests:  Recent Labs Lab 08/10/16 0007  AST 20  ALT 9*  ALKPHOS 60  BILITOT 0.6  PROT 6.1*  ALBUMIN 3.3*   No results for input(s): LIPASE, AMYLASE in the last 168 hours. No results for input(s): AMMONIA in the last 168 hours. CBC:  Recent Labs Lab 08/10/16 0007  WBC 4.7  NEUTROABS 3.3  HGB 9.5*  HCT 29.3*  MCV 84.9  PLT 191   Cardiac Enzymes:  Recent Labs Lab 08/10/16 0007  TROPONINI <0.03   BNP: Invalid input(s): POCBNP CBG:  Recent Labs Lab 08/10/16 2143  GLUCAP 80   D-Dimer No results for  input(s): DDIMER in the last 72 hours. Hgb A1c No results for input(s): HGBA1C in the last 72 hours. Lipid Profile No results for input(s): CHOL, HDL, LDLCALC, TRIG, CHOLHDL, LDLDIRECT in the last 72 hours. Thyroid function studies No results for input(s): TSH, T4TOTAL, T3FREE, THYROIDAB in the last 72 hours.  Invalid input(s): FREET3 Anemia work up No results for input(s): VITAMINB12, FOLATE, FERRITIN, TIBC, IRON, RETICCTPCT in the last 72 hours. Urinalysis    Component Value Date/Time   COLORURINE YELLOW 08/10/2016 0050   APPEARANCEUR CLOUDY (A) 08/10/2016 0050   LABSPEC 1.011 08/10/2016 0050   PHURINE 7.0 08/10/2016 0050   GLUCOSEU NEGATIVE 08/10/2016 0050   GLUCOSEU NEGATIVE 01/06/2015 1541   HGBUR NEGATIVE 08/10/2016 0050   BILIRUBINUR NEGATIVE 08/10/2016 0050   KETONESUR NEGATIVE 08/10/2016 0050   PROTEINUR 30 (A) 08/10/2016 0050   UROBILINOGEN 0.2 05/10/2015 1612   NITRITE NEGATIVE 08/10/2016 0050   LEUKOCYTESUR NEGATIVE 08/10/2016 0050   Sepsis Labs Invalid input(s): PROCALCITONIN,  WBC,  LACTICIDVEN Microbiology Recent Results (from the past 240 hour(s))  Culture, blood (routine x 2)     Status: None   Collection Time: 08/10/16 12:08 AM  Result Value Ref Range Status   Specimen Description BLOOD RIGHT ANTECUBITAL  Final   Special Requests BOTTLES DRAWN AEROBIC AND ANAEROBIC 5 CC  Final   Culture   Final    NO GROWTH 5 DAYS Performed at [facility]    Report Status 08/15/2016 FINAL  Final  Culture, blood (routine x 2)     Status: None   Collection Time: 08/10/16 12:10 AM  Result Value Ref Range Status   Specimen Description BLOOD LEFT ANTECUBITAL  Final   Special Requests BOTTLES DRAWN AEROBIC AND ANAEROBIC 5 CC  Final   Culture   Final    NO GROWTH 5 DAYS Performed at [facility]    Report Status 08/15/2016 FINAL  Final     Time coordinating discharge: Over  30 minutes  SIGNED:   Rosita Fire, MD  [facility] 08/15/2016, 2:21 PM  If 7PM-7AM, please contact night-coverage [email or web address] Password [email or web address]

## 2016-08-14 NOTE — Progress Notes (Signed)
Received a call from [place], per daughter Charlett Nose, plan is for d/c home. Patient has full time care at home through [facility], her daughter is agreeable to [facility] services. Discussed a hospital bed, daughter would like to wait on this for now. Has all the other equipment that is needed at home already. Have use [facility] in the past for [facility] services and would like to use them again. Contacted [facility] for referral, awaiting final orders, anticipating d/c in am.

## 2016-08-14 NOTE — Care Management (Signed)
ED CM received call from [place] at [facility] on 3W, concerning patient needing oxygen for discharge home this evening. Nurse was apparent providing discharge instructions when patient became hypoxic oxygen sat down to 75%. Dr Carolin Sicks placed order for Home oxygen. CM contacted several DME agencies, [employer], [facility] [employer] and [facility], none of the DME companies were able to provide services tonight  CM will leave handoff with Unit CM to follow up in the am. ED CM updated daughter Nelia Shi by phone she verbalizes understanding and is agreeable. CM notifed Merchandiser, retail.

## 2016-08-14 NOTE — Progress Notes (Signed)
OT Cancellation Note  Patient Details Name: Ranae Kidwell MRN: [id number] DOB: 1917-02-19   Cancelled Treatment:    Reason Eval/Treat Not Completed: Other (comment)  Spoke with pts daughter.  Pt has been DCed and is ready to go .  Will defer OT eval to home health care. Pts daughter in agreement.  Betsy Pries 08/14/2016, 5:00 PM

## 2016-08-14 NOTE — Progress Notes (Signed)
SATURATION QUALIFICATIONS: (This note is used to comply with regulatory documentation for home oxygen)  Patient Saturations on Room Air at Rest =75%  Patient Saturations on Room Air while Ambulating = not ambulatory  Patient Saturations on2Liters of oxygen while Ambulating =not ambulatory however at rest on 2 L of O2 pt sats 96-97%  Please briefly explain why patient needs home oxygen: needs oxygen to keep saturations above 90%.

## 2016-08-14 NOTE — Evaluation (Signed)
Physical Therapy Evaluation Patient Details Name: Stellar Penfold MRN: [id number] DOB: 06/11/1917 Today's Date: 08/14/2016   History of Present Illness  82 y.o. female with a past medical history significant for advanced dementia, hypothyroidism, HTN, CKD stage III and chronic anemia and admitted for seizure.  Clinical Impression  Pt admitted with above diagnosis. Pt currently with functional limitations due to the deficits listed below (see PT Problem List).  Pt will benefit from skilled PT to increase their independence and safety with mobility to allow discharge to the venue listed below.   No family present and pt poor historian due to advanced dementia.  Pt from home with daughter per chart.  Pt requiring at least mod assist at this time for bed mobility and standing.  If family unable to provide current assist level, pt may need SNF.     Follow Up Recommendations Supervision/Assistance - 24 hour;SNF (may need SNF if family unable to provide current assist)    Equipment Recommendations  None recommended by PT    Recommendations for Other Services       Precautions / Restrictions Precautions Precautions: Fall      Mobility  Bed Mobility Overal bed mobility: Needs Assistance Bed Mobility: Supine to Sit;Sit to Supine     Supine to sit: Mod assist Sit to supine: Mod assist   General bed mobility comments: assistance initially for manual cues, pt able to somewhat assist thereafter  Transfers Overall transfer level: Needs assistance Equipment used: Rolling walker (2 wheeled) Transfers: Sit to/from Stand Sit to Stand: Mod assist;+2 safety/equipment         General transfer comment: RW placed in front of pt and she reached out to hold, pt able to initiate sit to stands however requires assist due to strong posterior lean of LEs against bed surface, performed twice  Ambulation/Gait                Stairs            Wheelchair Mobility    Modified Rankin  (Stroke Patients Only)       Balance Overall balance assessment: Needs assistance         Standing balance support: Bilateral upper extremity supported;During functional activity Standing balance-Leahy Scale: Zero Standing balance comment: requires increased external support                             Pertinent Vitals/Pain Pain Assessment: Faces Faces Pain Scale: No hurt    Home Living Family/patient expects to be discharged to:: Private residence Living Arrangements: Children (lives with daughter) Available Help at Discharge: Family;Personal care attendant;Available 24 hours/day Type of Home: House Home Access: Level entry     Home Layout: One level Home Equipment: Walker - 2 wheels;Bedside commode Additional Comments: no family present, above per admission 9 months ago    Prior Function Level of Independence: Needs assistance   Gait / Transfers Assistance Needed: appears to have used RW, uncertain of mobility baseline           Hand Dominance        Extremity/Trunk Assessment   Upper Extremity Assessment: Generalized weakness           Lower Extremity Assessment: Generalized weakness         Communication   Communication: Expressive difficulties;HOH  Cognition Arousal/Alertness: Awake/alert Behavior During Therapy: WFL for tasks assessed/performed Overall Cognitive Status: No family/caregiver present to determine baseline cognitive functioning (hx advanced dementia)  General Comments      Exercises     Assessment/Plan    PT Assessment Patient needs continued PT services  PT Problem List Decreased strength;Decreased mobility;Decreased activity tolerance;Decreased balance;Decreased cognition;Decreased knowledge of use of DME          PT Treatment Interventions DME instruction;Gait training;Functional mobility training;Therapeutic exercise;Therapeutic activities;Patient/family education;Wheelchair  mobility training    PT Goals (Current goals can be found in the Care Plan section)  Acute Rehab PT Goals PT Goal Formulation: Patient unable to participate in goal setting Time For Goal Achievement: 08/21/16 Potential to Achieve Goals: Fair    Frequency Min 2X/week   Barriers to discharge        Co-evaluation               End of Session Equipment Utilized During Treatment: Gait belt Activity Tolerance: Patient tolerated treatment well Patient left: in bed;with call bell/phone within reach;with nursing/sitter in room;with bed alarm set           Time: 0950-1003 PT Time Calculation (min) (ACUTE ONLY): 13 min   Charges:   PT Evaluation $PT Eval Low Complexity: 1 Procedure     PT G Codes:        Hue Frick,KATHrine E 08/14/2016, 10:13 AM Carmelia Bake, PT, DPT 08/14/2016 Pager: [phone]

## 2016-08-15 ENCOUNTER — Ambulatory Visit: Payer: Self-pay | Admitting: Internal Medicine

## 2016-08-15 ENCOUNTER — Inpatient Hospital Stay (HOSPITAL_COMMUNITY): Payer: Commercial Managed Care - HMO

## 2016-08-15 LAB — CULTURE, BLOOD (ROUTINE X 2)
CULTURE: NO GROWTH
CULTURE: NO GROWTH

## 2016-08-15 MED ORDER — LEVETIRACETAM 100 MG/ML PO SOLN
500.0000 mg | Freq: Two times a day (BID) | ORAL | Status: DC
  Administered 2016-08-15: 500 mg via ORAL
  Filled 2016-08-15: qty 5

## 2016-08-15 MED ORDER — LEVETIRACETAM 100 MG/ML PO SOLN: 500.0000 mg | Freq: Two times a day (BID) | ORAL | 12 refills | Status: AC

## 2016-08-15 NOTE — Progress Notes (Signed)
Pt's oxygen saturations 100% when pulse ox probe placed on ear lobe and forehead. Case management and MD notified.  Order received to discontinue order for home O2.

## 2016-08-15 NOTE — Progress Notes (Signed)
Per RN and MD, pt no longer requires home 02. Order was discontinued.  Marney Doctor RN,BSN,NCM [phone]

## 2016-08-15 NOTE — Progress Notes (Addendum)
Brief note:   The patient was seen and re-examined today. She was discharged yesterday to home. However, she was unable to leave because of problem with setting off oxygen at home. Today, repeat chest x-ray unchanged from prior and has no acute finding. When changing the oxygen probe to forehead and earlobe, patient has oxygen saturation of 100% in room air. I believe the hypoxic episode yesterday was probably because of issue with oxygen probe or cold extremities. Patient will be discharged today without oxygen.  No other change in medical management. Patient is DO NOT RESUSCITATE and outpatient palliative service was referred. Family declined SNF. She will be discharged home with home care and family support. Recommended outpatient follow-up.  Vitals: 144/65, HR 68, O2 sat 100 in room air Elderly female lying comfortable on bed  Alert, awake, not oriented Lungs: Bilateral clear, no wheezing or crackle Cvs; regular rate rhythm, S1-S2 normal.

## 2016-08-15 NOTE — Progress Notes (Signed)
Pt discharged home with daughter in stable condition.  Discharge instructions given. Scripts sent to pharmacy of choice. Daughter Bethena Roys verbalized understanding. No immediate questions or concerns at this time.

## 2016-08-18 ENCOUNTER — Telehealth: Payer: Self-pay | Admitting: Internal Medicine

## 2016-08-18 DIAGNOSIS — F0391 Unspecified dementia with behavioral disturbance: Secondary | ICD-10-CM | POA: Diagnosis not present

## 2016-08-18 DIAGNOSIS — I429 Cardiomyopathy, unspecified: Secondary | ICD-10-CM | POA: Diagnosis not present

## 2016-08-18 DIAGNOSIS — L89611 Pressure ulcer of right heel, stage 1: Secondary | ICD-10-CM | POA: Diagnosis not present

## 2016-08-18 DIAGNOSIS — E43 Unspecified severe protein-calorie malnutrition: Secondary | ICD-10-CM | POA: Diagnosis not present

## 2016-08-18 DIAGNOSIS — R1310 Dysphagia, unspecified: Secondary | ICD-10-CM | POA: Diagnosis not present

## 2016-08-18 DIAGNOSIS — I129 Hypertensive chronic kidney disease with stage 1 through stage 4 chronic kidney disease, or unspecified chronic kidney disease: Secondary | ICD-10-CM | POA: Diagnosis not present

## 2016-08-18 DIAGNOSIS — N189 Chronic kidney disease, unspecified: Secondary | ICD-10-CM | POA: Diagnosis not present

## 2016-08-18 DIAGNOSIS — L89322 Pressure ulcer of left buttock, stage 2: Secondary | ICD-10-CM | POA: Diagnosis not present

## 2016-08-18 DIAGNOSIS — R569 Unspecified convulsions: Secondary | ICD-10-CM | POA: Diagnosis not present

## 2016-08-18 NOTE — Telephone Encounter (Signed)
error 

## 2016-08-21 ENCOUNTER — Telehealth: Payer: Self-pay | Admitting: Internal Medicine

## 2016-08-21 ENCOUNTER — Telehealth: Payer: Self-pay | Admitting: Emergency Medicine

## 2016-08-21 NOTE — Telephone Encounter (Signed)
Home health called and said family states patient has had changes in behavior. They would like orders to be able to do a in and out cath. To test for a uti. Please follow up, Thank you.  [phone] - Santiago Glad

## 2016-08-21 NOTE — Telephone Encounter (Signed)
[facility] called and needs to get nursing and wound care orders. Please advise thanks.

## 2016-08-22 ENCOUNTER — Encounter: Payer: Self-pay | Admitting: Internal Medicine

## 2016-08-22 DIAGNOSIS — R1310 Dysphagia, unspecified: Secondary | ICD-10-CM | POA: Diagnosis not present

## 2016-08-22 DIAGNOSIS — N390 Urinary tract infection, site not specified: Secondary | ICD-10-CM | POA: Diagnosis not present

## 2016-08-22 DIAGNOSIS — N189 Chronic kidney disease, unspecified: Secondary | ICD-10-CM | POA: Diagnosis not present

## 2016-08-22 DIAGNOSIS — R569 Unspecified convulsions: Secondary | ICD-10-CM | POA: Diagnosis not present

## 2016-08-22 DIAGNOSIS — F0391 Unspecified dementia with behavioral disturbance: Secondary | ICD-10-CM | POA: Diagnosis not present

## 2016-08-22 DIAGNOSIS — I129 Hypertensive chronic kidney disease with stage 1 through stage 4 chronic kidney disease, or unspecified chronic kidney disease: Secondary | ICD-10-CM | POA: Diagnosis not present

## 2016-08-22 DIAGNOSIS — I429 Cardiomyopathy, unspecified: Secondary | ICD-10-CM | POA: Diagnosis not present

## 2016-08-22 DIAGNOSIS — L89322 Pressure ulcer of left buttock, stage 2: Secondary | ICD-10-CM | POA: Diagnosis not present

## 2016-08-22 DIAGNOSIS — E43 Unspecified severe protein-calorie malnutrition: Secondary | ICD-10-CM | POA: Diagnosis not present

## 2016-08-22 DIAGNOSIS — L89611 Pressure ulcer of right heel, stage 1: Secondary | ICD-10-CM | POA: Diagnosis not present

## 2016-08-22 NOTE — Telephone Encounter (Signed)
[name] from [facility] called and wants you to please return her call thanks.

## 2016-08-22 NOTE — Telephone Encounter (Signed)
Ok for verbals 

## 2016-08-22 NOTE — Telephone Encounter (Signed)
Verbals given  

## 2016-08-22 NOTE — Telephone Encounter (Signed)
[place] for I and O cath  For UA and Cx - altered mental status

## 2016-08-23 ENCOUNTER — Encounter: Payer: Self-pay | Admitting: Internal Medicine

## 2016-08-23 ENCOUNTER — Other Ambulatory Visit: Payer: Self-pay | Admitting: Internal Medicine

## 2016-08-23 ENCOUNTER — Ambulatory Visit (INDEPENDENT_AMBULATORY_CARE_PROVIDER_SITE_OTHER): Payer: Commercial Managed Care - HMO | Admitting: Internal Medicine

## 2016-08-23 VITALS — BP 112/64 | HR 70 | Temp 98.8°F | Resp 20 | Wt 79.0 lb

## 2016-08-23 DIAGNOSIS — R569 Unspecified convulsions: Secondary | ICD-10-CM | POA: Diagnosis not present

## 2016-08-23 DIAGNOSIS — F0391 Unspecified dementia with behavioral disturbance: Secondary | ICD-10-CM | POA: Diagnosis not present

## 2016-08-23 DIAGNOSIS — Z23 Encounter for immunization: Secondary | ICD-10-CM

## 2016-08-23 DIAGNOSIS — N390 Urinary tract infection, site not specified: Secondary | ICD-10-CM

## 2016-08-23 MED ORDER — CEFUROXIME AXETIL 250 MG PO TABS: 250.0000 mg | ORAL_TABLET | Freq: Two times a day (BID) | ORAL | 0 refills | Status: AC

## 2016-08-23 NOTE — Assessment & Plan Note (Signed)
D/w daughter, declines seroquel for now,  to f/u any worsening symptoms or concerns

## 2016-08-23 NOTE — Telephone Encounter (Signed)
Left message

## 2016-08-23 NOTE — Assessment & Plan Note (Signed)
stable overall by history and exam, and pt to continue medical treatment as before,  to f/u any worsening symptoms or concerns, for neuro f/u at 3 mo per family request

## 2016-08-23 NOTE — Patient Instructions (Signed)
Please take all new medication as prescribed - the antibiotic already sent to [facility]  Please continue all other medications as before, and refills have been done if requested.  Please have the pharmacy call with any other refills you may need.  Please keep your appointments with your specialists as you may have planned  You will be contacted regarding the referral for: Neurology at 3 months  Please return in 6 months, or sooner if needed

## 2016-08-23 NOTE — Progress Notes (Signed)
Pre visit review using our clinic review tool, if applicable. No additional management support is needed unless otherwise documented below in the visit note. 

## 2016-08-23 NOTE — Progress Notes (Signed)
Subjective:    Patient ID: [name], female    DOB: [birth year], 82 y.o.   MRN: [id number]  HPI  Here after recent hospn with excellent summary at d/c:  82 y.o.femalewith a past medical history significant for advanced dementia, hypothyroidism, HTN, CKD stage III and chronic anemiawho presents with aspiration episode. In the ED and had an episode where she was right sided jerking and face twitching. She was given Ativan 3.5mg  IV that eventually stopped the episode. She was also given a dose of Keppra. Reportedly the patient has had seizure-type episodes in the past but has never been treated with AED.  Patient was evaluated by a neurologist. MRI without any acute stroke. EEG negative for active seizure. On Keppra 500 mg twice a day. No further episode of seizure in the hospital. Evaluated by PT OT recommended 24-hour supervision or SNF. Family declined SNF  Since home has been more hallucinatory and agitated, so daughter asked for UA Cx which we have results by fax  Which is c/w abnormal UA, cx now pending Unable to obtain history or review of system for the patient because of dementia.  Due for flu shot Past Medical History:  Diagnosis Date  . Anxiety   . Arthritis    "joints" (12/02/2013)  . Behavior disturbance   . Cardiomyopathy   . Colon cancer [facility])    daughter denis colon cancer only diverticulitis  . Depression   . Diverticulitis   . Diverticulosis of colon (without mention of hemorrhage) 07/2012  . GERD (gastroesophageal reflux disease)   . Glaucoma   . Herpes zoster   . History of blood transfusion    "related to diverticulitis" (12/02/2013)  . Hypertension   . Hyponatremia 07/2012   Na as low as 114.   Marland Kitchen Hypothyroid   . Internal hemorrhoid 07/2012  . Lumbar back pain   . Myocardial infarction   . Protein calorie malnutrition ([place]) 07/2012   BMI then: 16.5  . Psychosis   . Zenker diverticulum 07/2012   this and esophageal dysmotility on esophagram.    Past  Surgical History:  Procedure Laterality Date  . APPENDECTOMY  2005   ruptured appendix  . CATARACT EXTRACTION W/ INTRAOCULAR LENS  IMPLANT, BILATERAL Bilateral   . COLONOSCOPY  07/19/2012   Procedure: COLONOSCOPY;  Surgeon: Ladene Artist, MD,FACG;  Location: [facility];  Service: Endoscopy;  Laterality: N/A;  . GLAUCOMA SURGERY Right   . THYROIDECTOMY    . TUBAL LIGATION      reports that she has never smoked. She has never used smokeless tobacco. She reports that she does not drink alcohol or use drugs. family history includes Dementia in her sister; Stroke in her daughter. No Known Allergies Current Outpatient Prescriptions on File Prior to Visit  Medication Sig Dispense Refill  . acetaminophen (TYLENOL) 500 MG tablet Take 500 mg by mouth every 6 (six) hours as needed for moderate pain.    . Calcium Carbonate-Vit D-Min (CALTRATE 600+D PLUS MINERALS) 600-800 MG-UNIT TABS Take 1 tablet by mouth daily. 90 tablet 3  . CARAFATE 1 GM/10ML suspension TAKE 10 MLS (1 G TOTAL) BY MOUTH 4 (FOUR) TIMES DAILY - WITH MEALS AND AT BEDTIME. 420 mL 3  . [facility] VITAMIN B12 1000 MCG tablet TAKE 1 TABLET (1,000 MCG TOTAL) BY MOUTH DAILY. 30 tablet 11  . donepezil (ARICEPT) 5 MG tablet Take 1 tablet (5 mg total) by mouth at bedtime. 30 tablet 0  . feeding supplement, ENSURE ENLIVE, (ENSURE ENLIVE)  LIQD Take 237 mLs by mouth 3 (three) times daily between meals. 237 mL 12  . isosorbide mononitrate (IMDUR) 30 MG 24 hr tablet TAKE 1 TABLET (30 MG TOTAL) BY MOUTH DAILY. 90 tablet 2  . levETIRAcetam (KEPPRA) 100 MG/ML solution Take 5 mLs (500 mg total) by mouth 2 (two) times daily. 473 mL 12  . levothyroxine (SYNTHROID, LEVOTHROID) 88 MCG tablet TAKE 1 TABLET (88 MCG TOTAL) BY MOUTH DAILY BEFORE BREAKFAST. 90 tablet 2  . lisinopril (PRINIVIL,ZESTRIL) 10 MG tablet Take 1 tablet (10 mg total) by mouth daily. 90 tablet 2  . nitroGLYCERIN (NITROSTAT) 0.4 MG SL tablet Place 1 tablet (0.4 mg total) under the tongue every  5 (five) minutes as needed. Chest pains 30 tablet 6  . polyethylene glycol powder (GLYCOLAX/MIRALAX) powder TAKE 17 G BY MOUTH DAILY. 1054 g 0  . sertraline (ZOLOFT) 25 MG tablet TAKE 1 TABLET (25 MG TOTAL) BY MOUTH DAILY. 30 tablet 11  . [DISCONTINUED] isosorbide dinitrate (ISORDIL) 30 MG tablet Take 30 mg by mouth 3 (three) times daily.      . [DISCONTINUED] ranitidine (ZANTAC) 150 MG tablet Take 150 mg by mouth 2 (two) times daily.       No current facility-administered medications on file prior to visit.     Review of Systems Unable due to dementia    Objective:   Physical Exam BP 112/64   Pulse 70   Temp 98.8 F (37.1 C) (Oral)   Resp 20   Wt 79 lb (35.8 kg)   SpO2 90%   BMI 15.43 kg/m  VS noted,  Constitutional: Pt appears in no apparent distress HENT: Head: NCAT.  Right Ear: External ear normal.  Left Ear: External ear normal.  Eyes: . Pupils are equal, round, and reactive to light. Conjunctivae and EOM are normal Neck: Normal range of motion. Neck supple.  Cardiovascular: Normal rate and regular rhythm.   Pulmonary/Chest: Effort normal and breath sounds without rales or wheezing.  Abd:  Soft, NT, ND, + BS, no apparent ,flank tender Neurological: Pt is alert. motor grossly intact Skin: Skin is warm. No rash, no LE edema Psychiatric: Pt behavior is blunt, does not respond to questions No agitation.     Assessment & Plan:

## 2016-08-23 NOTE — Telephone Encounter (Signed)
Received I and O UA report c/w probable uti, cx pending  Last cx with multiresistant org's  To start ceftin course empiric, await urine cx  To corinne to notify pt's family or caretaker

## 2016-08-25 DIAGNOSIS — R569 Unspecified convulsions: Secondary | ICD-10-CM | POA: Diagnosis not present

## 2016-08-25 DIAGNOSIS — L89322 Pressure ulcer of left buttock, stage 2: Secondary | ICD-10-CM | POA: Diagnosis not present

## 2016-08-25 DIAGNOSIS — I429 Cardiomyopathy, unspecified: Secondary | ICD-10-CM | POA: Diagnosis not present

## 2016-08-25 DIAGNOSIS — F0391 Unspecified dementia with behavioral disturbance: Secondary | ICD-10-CM | POA: Diagnosis not present

## 2016-08-25 DIAGNOSIS — R1310 Dysphagia, unspecified: Secondary | ICD-10-CM | POA: Diagnosis not present

## 2016-08-25 DIAGNOSIS — N189 Chronic kidney disease, unspecified: Secondary | ICD-10-CM | POA: Diagnosis not present

## 2016-08-25 DIAGNOSIS — E43 Unspecified severe protein-calorie malnutrition: Secondary | ICD-10-CM | POA: Diagnosis not present

## 2016-08-25 DIAGNOSIS — I129 Hypertensive chronic kidney disease with stage 1 through stage 4 chronic kidney disease, or unspecified chronic kidney disease: Secondary | ICD-10-CM | POA: Diagnosis not present

## 2016-08-25 DIAGNOSIS — L89611 Pressure ulcer of right heel, stage 1: Secondary | ICD-10-CM | POA: Diagnosis not present

## 2016-08-28 NOTE — Assessment & Plan Note (Signed)
Mild to mod, for antibx course - ceftin asd,  to f/u any worsening symptoms or concerns

## 2016-08-30 DIAGNOSIS — R1310 Dysphagia, unspecified: Secondary | ICD-10-CM | POA: Diagnosis not present

## 2016-08-30 DIAGNOSIS — I129 Hypertensive chronic kidney disease with stage 1 through stage 4 chronic kidney disease, or unspecified chronic kidney disease: Secondary | ICD-10-CM | POA: Diagnosis not present

## 2016-08-30 DIAGNOSIS — R569 Unspecified convulsions: Secondary | ICD-10-CM | POA: Diagnosis not present

## 2016-08-30 DIAGNOSIS — F0391 Unspecified dementia with behavioral disturbance: Secondary | ICD-10-CM | POA: Diagnosis not present

## 2016-08-30 DIAGNOSIS — I429 Cardiomyopathy, unspecified: Secondary | ICD-10-CM | POA: Diagnosis not present

## 2016-08-30 DIAGNOSIS — N189 Chronic kidney disease, unspecified: Secondary | ICD-10-CM | POA: Diagnosis not present

## 2016-08-30 DIAGNOSIS — L89322 Pressure ulcer of left buttock, stage 2: Secondary | ICD-10-CM | POA: Diagnosis not present

## 2016-08-30 DIAGNOSIS — L89611 Pressure ulcer of right heel, stage 1: Secondary | ICD-10-CM | POA: Diagnosis not present

## 2016-08-30 DIAGNOSIS — E43 Unspecified severe protein-calorie malnutrition: Secondary | ICD-10-CM | POA: Diagnosis not present

## 2016-09-12 ENCOUNTER — Other Ambulatory Visit: Payer: Self-pay | Admitting: Internal Medicine

## 2016-09-13 ENCOUNTER — Ambulatory Visit (INDEPENDENT_AMBULATORY_CARE_PROVIDER_SITE_OTHER): Payer: Commercial Managed Care - HMO

## 2016-09-13 ENCOUNTER — Ambulatory Visit (HOSPITAL_COMMUNITY)
Admission: EM | Admit: 2016-09-13 | Discharge: 2016-09-13 | Disposition: A | Discharge status: Home / Self Care | Payer: Commercial Managed Care - HMO | Attending: Family Medicine | Admitting: Family Medicine

## 2016-09-13 ENCOUNTER — Encounter (HOSPITAL_COMMUNITY): Payer: Self-pay | Admitting: Emergency Medicine

## 2016-09-13 DIAGNOSIS — S20211A Contusion of right front wall of thorax, initial encounter: Secondary | ICD-10-CM | POA: Diagnosis not present

## 2016-09-13 DIAGNOSIS — N390 Urinary tract infection, site not specified: Secondary | ICD-10-CM | POA: Diagnosis not present

## 2016-09-13 DIAGNOSIS — W19XXXA Unspecified fall, initial encounter: Secondary | ICD-10-CM

## 2016-09-13 DIAGNOSIS — R319 Hematuria, unspecified: Secondary | ICD-10-CM | POA: Diagnosis not present

## 2016-09-13 DIAGNOSIS — S299XXA Unspecified injury of thorax, initial encounter: Secondary | ICD-10-CM | POA: Diagnosis not present

## 2016-09-13 LAB — POCT URINALYSIS DIP (DEVICE)
Bilirubin Urine: NEGATIVE
Glucose, UA: NEGATIVE mg/dL
Ketones, ur: NEGATIVE mg/dL
Nitrite: NEGATIVE
Protein, ur: 100 mg/dL — AB
Specific Gravity, Urine: 1.02 (ref 1.005–1.030)
Urobilinogen, UA: 0.2 mg/dL (ref 0.0–1.0)
pH: 6 (ref 5.0–8.0)

## 2016-09-13 MED ORDER — CEPHALEXIN 500 MG PO CAPS: 500.0000 mg | ORAL_CAPSULE | Freq: Three times a day (TID) | ORAL | 0 refills | Status: AC

## 2016-09-13 MED ORDER — CEFDINIR 250 MG/5ML PO SUSR: ORAL | 0 refills | Status: AC

## 2016-09-13 NOTE — ED Triage Notes (Signed)
The patient presented to the [facility] with her family with a complaint of right side collar bone pain secondary to a fall that occurred last night.

## 2016-09-13 NOTE — ED Provider Notes (Signed)
CSN: [id number]     Arrival date & time 09/13/16  1701 History   First MD Initiated Contact with Patient 09/13/16 1756     Chief Complaint  Patient presents with  . Fall   (Consider location/radiation/quality/duration/timing/severity/associated sxs/prior Treatment) Patient fell last night and fell on right chest and right arm.  Patient is c/o anterior chest wall pain, right shoulder pain and right clavicle pain.   The history is provided by the patient.  Fall  This is a new problem. The current episode started 6 to 12 hours ago. The problem occurs constantly. The problem has not changed since onset.Nothing aggravates the symptoms. Nothing relieves the symptoms. She has tried nothing for the symptoms.    Past Medical History:  Diagnosis Date  . Anxiety   . Arthritis    "joints" (12/02/2013)  . Behavior disturbance   . Cardiomyopathy   . Colon cancer [facility])    daughter denis colon cancer only diverticulitis  . Depression   . Diverticulitis   . Diverticulosis of colon (without mention of hemorrhage) 07/2012  . GERD (gastroesophageal reflux disease)   . Glaucoma   . Herpes zoster   . History of blood transfusion    "related to diverticulitis" (12/02/2013)  . Hypertension   . Hyponatremia 07/2012   Na as low as 114.   Marland Kitchen Hypothyroid   . Internal hemorrhoid 07/2012  . Lumbar back pain   . Myocardial infarction   . Protein calorie malnutrition ([place]) 07/2012   BMI then: 16.5  . Psychosis   . Zenker diverticulum 07/2012   this and esophageal dysmotility on esophagram.    Past Surgical History:  Procedure Laterality Date  . APPENDECTOMY  2005   ruptured appendix  . CATARACT EXTRACTION W/ INTRAOCULAR LENS  IMPLANT, BILATERAL Bilateral   . COLONOSCOPY  07/19/2012   Procedure: COLONOSCOPY;  Surgeon: Ladene Artist, MD,FACG;  Location: [facility];  Service: Endoscopy;  Laterality: N/A;  . GLAUCOMA SURGERY Right   . THYROIDECTOMY    . TUBAL LIGATION     Family History   Problem Relation Age of Onset  . Stroke Daughter   . Heart attack    . Dementia Sister   . Colon cancer Neg Hx    Social History  Substance Use Topics  . Smoking status: Never Smoker  . Smokeless tobacco: Never Used  . Alcohol use No   OB History    No data available     Review of Systems  Constitutional: Negative.   HENT: Negative.   Eyes: Negative.   Respiratory: Negative.   Gastrointestinal: Negative.   Endocrine: Negative.   Genitourinary: Negative.   Musculoskeletal: Positive for arthralgias and joint swelling.  Allergic/Immunologic: Negative.   Neurological: Negative.   Hematological: Negative.   Psychiatric/Behavioral: Negative.     Allergies  Patient has no known allergies.  Home Medications   Prior to Admission medications   Medication Sig Start Date End Date Taking? Authorizing Provider  acetaminophen (TYLENOL) 500 MG tablet Take 500 mg by mouth every 6 (six) hours as needed for moderate pain.   Yes Historical Provider, MD  Calcium Carbonate-Vit D-Min (CALTRATE 600+D PLUS MINERALS) 600-800 MG-UNIT TABS Take 1 tablet by mouth daily. 11/22/15  Yes Biagio Borg, MD  CARAFATE 1 GM/10ML suspension TAKE 10 MLS (1 G TOTAL) BY MOUTH 4 (FOUR) TIMES DAILY - WITH MEALS AND AT BEDTIME. 09/12/16  Yes Biagio Borg, MD  [facility] VITAMIN B12 1000 MCG tablet TAKE 1 TABLET (1,000 MCG  TOTAL) BY MOUTH DAILY. 06/05/16  Yes Biagio Borg, MD  isosorbide mononitrate (IMDUR) 30 MG 24 hr tablet TAKE 1 TABLET (30 MG TOTAL) BY MOUTH DAILY. 07/03/16  Yes Biagio Borg, MD  levETIRAcetam (KEPPRA) 100 MG/ML solution Take 5 mLs (500 mg total) by mouth 2 (two) times daily. 08/15/16  Yes Dron Tanna Furry, MD  lisinopril (PRINIVIL,ZESTRIL) 10 MG tablet Take 1 tablet (10 mg total) by mouth daily. 03/30/16  Yes Biagio Borg, MD  polyethylene glycol powder (GLYCOLAX/MIRALAX) powder TAKE 17 G BY MOUTH DAILY. 09/12/16  Yes Biagio Borg, MD  sertraline (ZOLOFT) 25 MG tablet TAKE 1 TABLET (25 MG TOTAL) BY  MOUTH DAILY. 05/26/16  Yes Biagio Borg, MD  cefdinir (OMNICEF) 250 MG/5ML suspension Take 6 ml po bid 09/13/16   Lysbeth Penner, FNP  cephALEXin (KEFLEX) 500 MG capsule Take 1 capsule (500 mg total) by mouth 3 (three) times daily. 09/13/16   Lysbeth Penner, FNP   Meds Ordered and Administered this Visit  Medications - No data to display  BP 132/93 (BP Location: Right Arm)   Pulse 87   Temp 100.6 F (38.1 C) (Temporal)   Resp 18   SpO2 98%  No data found.   Physical Exam  Constitutional: She appears well-developed and well-nourished.  HENT:  Head: Normocephalic and atraumatic.  Eyes: EOM are normal. Pupils are equal, round, and reactive to light.  Neck: Normal range of motion. Neck supple.  Cardiovascular: Normal rate, regular rhythm and normal heart sounds.   Pulmonary/Chest: Effort normal and breath sounds normal.  Nursing note and vitals reviewed.   Urgent Care Course   Clinical Course     Procedures (including critical care time)  Labs Review Labs Reviewed  POCT URINALYSIS DIP (DEVICE) - Abnormal; Notable for the following:       Result Value   Hgb urine dipstick MODERATE (*)    Protein, ur 100 (*)    Leukocytes, UA TRACE (*)    All other components within normal limits    Imaging Review Dg Ribs Unilateral W/chest Right  Result Date: 09/13/2016 CLINICAL DATA:  Right shoulder pain from fall. EXAM: RIGHT RIBS AND CHEST - 3+ VIEW COMPARISON:  Chest x-ray 08/15/2016 FINDINGS: No visible displaced rib fracture. Heart is mildly enlarged. Tortuosity of the thoracic aorta. Lungs are clear. No effusions or pneumothorax. IMPRESSION: No visualized displaced rib fracture.  No acute findings. Electronically Signed   By: Rolm Baptise M.D.   On: 09/13/2016 18:43     Visual Acuity Review  Right Eye Distance:   Left Eye Distance:   Bilateral Distance:    Right Eye Near:   Left Eye Near:    Bilateral Near:         MDM   1. Fall, initial encounter   2. Chest  wall contusion, right, initial encounter   3. Urinary tract infection with hematuria, site unspecified    Cefdinir 300mg  po bid x 7 days  Take tylenol otc as directed. Follow up with [place], FNP 09/13/16 1901

## 2016-09-28 DIAGNOSIS — I429 Cardiomyopathy, unspecified: Secondary | ICD-10-CM | POA: Diagnosis not present

## 2016-09-28 DIAGNOSIS — E43 Unspecified severe protein-calorie malnutrition: Secondary | ICD-10-CM | POA: Diagnosis not present

## 2016-09-28 DIAGNOSIS — F0391 Unspecified dementia with behavioral disturbance: Secondary | ICD-10-CM | POA: Diagnosis not present

## 2016-09-28 DIAGNOSIS — L89611 Pressure ulcer of right heel, stage 1: Secondary | ICD-10-CM | POA: Diagnosis not present

## 2016-09-28 DIAGNOSIS — R569 Unspecified convulsions: Secondary | ICD-10-CM | POA: Diagnosis not present

## 2016-09-28 DIAGNOSIS — I129 Hypertensive chronic kidney disease with stage 1 through stage 4 chronic kidney disease, or unspecified chronic kidney disease: Secondary | ICD-10-CM | POA: Diagnosis not present

## 2016-09-28 DIAGNOSIS — R1310 Dysphagia, unspecified: Secondary | ICD-10-CM | POA: Diagnosis not present

## 2016-09-28 DIAGNOSIS — L89322 Pressure ulcer of left buttock, stage 2: Secondary | ICD-10-CM | POA: Diagnosis not present

## 2016-09-28 DIAGNOSIS — N189 Chronic kidney disease, unspecified: Secondary | ICD-10-CM | POA: Diagnosis not present

## 2016-10-19 DIAGNOSIS — N189 Chronic kidney disease, unspecified: Secondary | ICD-10-CM

## 2016-10-19 DIAGNOSIS — I129 Hypertensive chronic kidney disease with stage 1 through stage 4 chronic kidney disease, or unspecified chronic kidney disease: Secondary | ICD-10-CM

## 2016-10-19 DIAGNOSIS — E039 Hypothyroidism, unspecified: Secondary | ICD-10-CM

## 2016-10-19 DIAGNOSIS — M150 Primary generalized (osteo)arthritis: Secondary | ICD-10-CM

## 2016-10-19 DIAGNOSIS — R1310 Dysphagia, unspecified: Secondary | ICD-10-CM | POA: Diagnosis not present

## 2016-10-19 DIAGNOSIS — I252 Old myocardial infarction: Secondary | ICD-10-CM

## 2016-10-19 DIAGNOSIS — I429 Cardiomyopathy, unspecified: Secondary | ICD-10-CM

## 2016-10-19 DIAGNOSIS — R569 Unspecified convulsions: Secondary | ICD-10-CM | POA: Diagnosis not present

## 2016-10-19 DIAGNOSIS — K5793 Diverticulitis of intestine, part unspecified, without perforation or abscess with bleeding: Secondary | ICD-10-CM

## 2016-10-19 DIAGNOSIS — F329 Major depressive disorder, single episode, unspecified: Secondary | ICD-10-CM

## 2016-10-19 DIAGNOSIS — D649 Anemia, unspecified: Secondary | ICD-10-CM

## 2016-10-19 DIAGNOSIS — L89322 Pressure ulcer of left buttock, stage 2: Secondary | ICD-10-CM | POA: Diagnosis not present

## 2016-10-19 DIAGNOSIS — F419 Anxiety disorder, unspecified: Secondary | ICD-10-CM

## 2016-10-19 DIAGNOSIS — L89611 Pressure ulcer of right heel, stage 1: Secondary | ICD-10-CM

## 2016-10-19 DIAGNOSIS — E43 Unspecified severe protein-calorie malnutrition: Secondary | ICD-10-CM

## 2016-10-19 DIAGNOSIS — K219 Gastro-esophageal reflux disease without esophagitis: Secondary | ICD-10-CM

## 2016-10-19 DIAGNOSIS — F0391 Unspecified dementia with behavioral disturbance: Secondary | ICD-10-CM | POA: Diagnosis not present

## 2016-10-20 DIAGNOSIS — F0391 Unspecified dementia with behavioral disturbance: Secondary | ICD-10-CM | POA: Diagnosis not present

## 2016-11-02 ENCOUNTER — Encounter (HOSPITAL_COMMUNITY): Payer: Self-pay | Admitting: Emergency Medicine

## 2016-11-02 ENCOUNTER — Emergency Department (HOSPITAL_COMMUNITY): Payer: Medicare HMO

## 2016-11-02 ENCOUNTER — Observation Stay (HOSPITAL_COMMUNITY)
Admission: EM | Admit: 2016-11-02 | Discharge: 2016-11-16 | Disposition: E | Payer: Medicare HMO | Attending: Family Medicine | Admitting: Family Medicine

## 2016-11-02 DIAGNOSIS — Z85038 Personal history of other malignant neoplasm of large intestine: Secondary | ICD-10-CM | POA: Insufficient documentation

## 2016-11-02 DIAGNOSIS — I9589 Other hypotension: Secondary | ICD-10-CM

## 2016-11-02 DIAGNOSIS — E876 Hypokalemia: Secondary | ICD-10-CM | POA: Diagnosis not present

## 2016-11-02 DIAGNOSIS — E43 Unspecified severe protein-calorie malnutrition: Secondary | ICD-10-CM | POA: Insufficient documentation

## 2016-11-02 DIAGNOSIS — R569 Unspecified convulsions: Secondary | ICD-10-CM | POA: Insufficient documentation

## 2016-11-02 DIAGNOSIS — E871 Hypo-osmolality and hyponatremia: Secondary | ICD-10-CM | POA: Insufficient documentation

## 2016-11-02 DIAGNOSIS — Z79899 Other long term (current) drug therapy: Secondary | ICD-10-CM | POA: Insufficient documentation

## 2016-11-02 DIAGNOSIS — R4182 Altered mental status, unspecified: Secondary | ICD-10-CM | POA: Diagnosis not present

## 2016-11-02 DIAGNOSIS — Z66 Do not resuscitate: Secondary | ICD-10-CM | POA: Diagnosis not present

## 2016-11-02 DIAGNOSIS — I469 Cardiac arrest, cause unspecified: Secondary | ICD-10-CM | POA: Insufficient documentation

## 2016-11-02 DIAGNOSIS — I429 Cardiomyopathy, unspecified: Secondary | ICD-10-CM | POA: Insufficient documentation

## 2016-11-02 DIAGNOSIS — F419 Anxiety disorder, unspecified: Secondary | ICD-10-CM | POA: Diagnosis not present

## 2016-11-02 DIAGNOSIS — Z9841 Cataract extraction status, right eye: Secondary | ICD-10-CM | POA: Insufficient documentation

## 2016-11-02 DIAGNOSIS — F0391 Unspecified dementia with behavioral disturbance: Secondary | ICD-10-CM | POA: Insufficient documentation

## 2016-11-02 DIAGNOSIS — R402441 Other coma, without documented Glasgow coma scale score, or with partial score reported, in the field [EMT or ambulance]: Secondary | ICD-10-CM | POA: Diagnosis not present

## 2016-11-02 DIAGNOSIS — K225 Diverticulum of esophagus, acquired: Secondary | ICD-10-CM | POA: Diagnosis not present

## 2016-11-02 DIAGNOSIS — I10 Essential (primary) hypertension: Secondary | ICD-10-CM | POA: Diagnosis not present

## 2016-11-02 DIAGNOSIS — Z820 Family history of epilepsy and other diseases of the nervous system: Secondary | ICD-10-CM | POA: Insufficient documentation

## 2016-11-02 DIAGNOSIS — R627 Adult failure to thrive: Secondary | ICD-10-CM | POA: Diagnosis not present

## 2016-11-02 DIAGNOSIS — Z515 Encounter for palliative care: Principal | ICD-10-CM | POA: Insufficient documentation

## 2016-11-02 DIAGNOSIS — K5730 Diverticulosis of large intestine without perforation or abscess without bleeding: Secondary | ICD-10-CM | POA: Insufficient documentation

## 2016-11-02 DIAGNOSIS — Z823 Family history of stroke: Secondary | ICD-10-CM | POA: Insufficient documentation

## 2016-11-02 DIAGNOSIS — I491 Atrial premature depolarization: Secondary | ICD-10-CM | POA: Diagnosis not present

## 2016-11-02 DIAGNOSIS — J309 Allergic rhinitis, unspecified: Secondary | ICD-10-CM | POA: Diagnosis not present

## 2016-11-02 DIAGNOSIS — L89301 Pressure ulcer of unspecified buttock, stage 1: Secondary | ICD-10-CM | POA: Insufficient documentation

## 2016-11-02 DIAGNOSIS — G4700 Insomnia, unspecified: Secondary | ICD-10-CM | POA: Insufficient documentation

## 2016-11-02 DIAGNOSIS — I252 Old myocardial infarction: Secondary | ICD-10-CM | POA: Insufficient documentation

## 2016-11-02 DIAGNOSIS — Z8249 Family history of ischemic heart disease and other diseases of the circulatory system: Secondary | ICD-10-CM | POA: Insufficient documentation

## 2016-11-02 DIAGNOSIS — M1990 Unspecified osteoarthritis, unspecified site: Secondary | ICD-10-CM | POA: Diagnosis not present

## 2016-11-02 DIAGNOSIS — M545 Low back pain: Secondary | ICD-10-CM | POA: Insufficient documentation

## 2016-11-02 DIAGNOSIS — Z9842 Cataract extraction status, left eye: Secondary | ICD-10-CM | POA: Insufficient documentation

## 2016-11-02 DIAGNOSIS — F329 Major depressive disorder, single episode, unspecified: Secondary | ICD-10-CM | POA: Insufficient documentation

## 2016-11-02 DIAGNOSIS — H409 Unspecified glaucoma: Secondary | ICD-10-CM | POA: Insufficient documentation

## 2016-11-02 DIAGNOSIS — E890 Postprocedural hypothyroidism: Secondary | ICD-10-CM | POA: Insufficient documentation

## 2016-11-02 DIAGNOSIS — N390 Urinary tract infection, site not specified: Secondary | ICD-10-CM | POA: Insufficient documentation

## 2016-11-02 DIAGNOSIS — K219 Gastro-esophageal reflux disease without esophagitis: Secondary | ICD-10-CM | POA: Insufficient documentation

## 2016-11-02 DIAGNOSIS — R6889 Other general symptoms and signs: Secondary | ICD-10-CM | POA: Diagnosis not present

## 2016-11-02 LAB — CBC WITH DIFFERENTIAL/PLATELET
Basophils Absolute: 0 10*3/uL (ref 0.0–0.1)
Basophils Relative: 1 %
EOS ABS: 0.1 10*3/uL (ref 0.0–0.7)
EOS PCT: 1 %
HCT: 29.7 % — ABNORMAL LOW (ref 36.0–46.0)
Hemoglobin: 9.1 g/dL — ABNORMAL LOW (ref 12.0–15.0)
LYMPHS ABS: 1.8 10*3/uL (ref 0.7–4.0)
LYMPHS PCT: 29 %
MCH: 25.9 pg — AB (ref 26.0–34.0)
MCHC: 30.6 g/dL (ref 30.0–36.0)
MCV: 84.6 fL (ref 78.0–100.0)
MONO ABS: 0.2 10*3/uL (ref 0.1–1.0)
MONOS PCT: 3 %
Neutro Abs: 4.3 10*3/uL (ref 1.7–7.7)
Neutrophils Relative %: 66 %
PLATELETS: 217 10*3/uL (ref 150–400)
RBC: 3.51 MIL/uL — ABNORMAL LOW (ref 3.87–5.11)
RDW: 17 % — ABNORMAL HIGH (ref 11.5–15.5)
WBC: 6.3 10*3/uL (ref 4.0–10.5)

## 2016-11-02 LAB — URINALYSIS, ROUTINE W REFLEX MICROSCOPIC
BILIRUBIN URINE: NEGATIVE
GLUCOSE, UA: 100 mg/dL — AB
Ketones, ur: NEGATIVE mg/dL
Nitrite: NEGATIVE
SPECIFIC GRAVITY, URINE: 1.015 (ref 1.005–1.030)
pH: 7 (ref 5.0–8.0)

## 2016-11-02 LAB — URINALYSIS, MICROSCOPIC (REFLEX)

## 2016-11-02 LAB — I-STAT CHEM 8, ED
BUN: 15 mg/dL (ref 6–20)
CREATININE: 1 mg/dL (ref 0.44–1.00)
Calcium, Ion: 1.11 mmol/L — ABNORMAL LOW (ref 1.15–1.40)
Chloride: 101 mmol/L (ref 101–111)
GLUCOSE: 182 mg/dL — AB (ref 65–99)
HCT: 28 % — ABNORMAL LOW (ref 36.0–46.0)
HEMOGLOBIN: 9.5 g/dL — AB (ref 12.0–15.0)
Potassium: 2.3 mmol/L — CL (ref 3.5–5.1)
SODIUM: 143 mmol/L (ref 135–145)
TCO2: 24 mmol/L (ref 0–100)

## 2016-11-02 MED ORDER — DIPHENHYDRAMINE HCL 50 MG/ML IJ SOLN: 12.5000 mg | INTRAMUSCULAR | Status: DC | PRN

## 2016-11-02 MED ORDER — HALOPERIDOL LACTATE 2 MG/ML PO CONC
0.5000 mg | ORAL | Status: DC | PRN
  Filled 2016-11-02: qty 0.3

## 2016-11-02 MED ORDER — POLYVINYL ALCOHOL 1.4 % OP SOLN: 1.0000 [drp] | Freq: Four times a day (QID) | OPHTHALMIC | Status: DC | PRN

## 2016-11-02 MED ORDER — ACETAMINOPHEN 650 MG RE SUPP: 650.0000 mg | Freq: Four times a day (QID) | RECTAL | Status: DC | PRN

## 2016-11-02 MED ORDER — GLYCOPYRROLATE 0.2 MG/ML IJ SOLN: 0.2000 mg | INTRAMUSCULAR | Status: DC | PRN

## 2016-11-02 MED ORDER — HALOPERIDOL 1 MG PO TABS: 0.5000 mg | ORAL_TABLET | ORAL | Status: DC | PRN

## 2016-11-02 MED ORDER — BISACODYL 10 MG RE SUPP: 10.0000 mg | Freq: Every day | RECTAL | Status: DC | PRN

## 2016-11-02 MED ORDER — SODIUM CHLORIDE 0.9 % IV SOLN: INTRAVENOUS | Status: DC

## 2016-11-02 MED ORDER — LOPERAMIDE HCL 2 MG PO CAPS: 2.0000 mg | ORAL_CAPSULE | ORAL | Status: DC | PRN

## 2016-11-02 MED ORDER — SODIUM CHLORIDE 0.9 % IV BOLUS (SEPSIS)
500.0000 mL | Freq: Once | INTRAVENOUS | Status: AC
  Administered 2016-11-02: 500 mL via INTRAVENOUS

## 2016-11-02 MED ORDER — GLYCOPYRROLATE 1 MG PO TABS: 1.0000 mg | ORAL_TABLET | ORAL | Status: DC | PRN

## 2016-11-02 MED ORDER — HYDROMORPHONE HCL 2 MG/ML IJ SOLN: 1.0000 mg | INTRAMUSCULAR | Status: DC | PRN

## 2016-11-02 MED ORDER — SODIUM CHLORIDE 0.9 % IV BOLUS (SEPSIS)
1000.0000 mL | Freq: Once | INTRAVENOUS | Status: AC
  Administered 2016-11-02: 1000 mL via INTRAVENOUS

## 2016-11-02 MED ORDER — ONDANSETRON HCL 4 MG/2ML IJ SOLN: 4.0000 mg | Freq: Four times a day (QID) | INTRAMUSCULAR | Status: DC | PRN

## 2016-11-02 MED ORDER — SODIUM CHLORIDE 0.9 % IV SOLN
1000.0000 mg | INTRAVENOUS | Status: AC
  Administered 2016-11-02: 1000 mg via INTRAVENOUS
  Filled 2016-11-02: qty 10

## 2016-11-02 MED ORDER — ACETAMINOPHEN 325 MG PO TABS: 650.0000 mg | ORAL_TABLET | Freq: Four times a day (QID) | ORAL | Status: DC | PRN

## 2016-11-02 MED ORDER — ALBUTEROL SULFATE (2.5 MG/3ML) 0.083% IN NEBU: 2.5000 mg | INHALATION_SOLUTION | RESPIRATORY_TRACT | Status: DC | PRN

## 2016-11-02 MED ORDER — SODIUM CHLORIDE 0.9 % IV SOLN
30.0000 meq | Freq: Once | INTRAVENOUS | Status: AC
  Administered 2016-11-02: 30 meq via INTRAVENOUS
  Filled 2016-11-02: qty 15

## 2016-11-02 MED ORDER — ONDANSETRON 4 MG PO TBDP: 4.0000 mg | ORAL_TABLET | Freq: Four times a day (QID) | ORAL | Status: DC | PRN

## 2016-11-02 MED ORDER — ATROPINE SULFATE 1 MG/10ML IJ SOSY
PREFILLED_SYRINGE | INTRAMUSCULAR | Status: AC
  Filled 2016-11-02: qty 10

## 2016-11-02 MED ORDER — SODIUM CHLORIDE 0.9 % IV SOLN: 12.5000 mg | Freq: Four times a day (QID) | INTRAVENOUS | Status: DC | PRN

## 2016-11-02 MED ORDER — LORAZEPAM 2 MG/ML IJ SOLN: 1.0000 mg | INTRAMUSCULAR | Status: DC | PRN

## 2016-11-02 MED ORDER — LORAZEPAM 2 MG/ML IJ SOLN
1.0000 mg | Freq: Once | INTRAMUSCULAR | Status: AC
Start: 2016-11-02 — End: 2016-11-02
  Administered 2016-11-02: 1 mg via INTRAVENOUS
  Filled 2016-11-02: qty 1

## 2016-11-02 MED ORDER — MORPHINE SULFATE (CONCENTRATE) 10 MG/0.5ML PO SOLN: 5.0000 mg | ORAL | Status: DC | PRN

## 2016-11-02 MED ORDER — BIOTENE DRY MOUTH MT LIQD: 15.0000 mL | OROMUCOSAL | Status: DC | PRN

## 2016-11-02 MED ORDER — ATROPINE SULFATE 1 MG/10ML IJ SOSY
1.0000 mg | PREFILLED_SYRINGE | Freq: Once | INTRAMUSCULAR | Status: AC
  Administered 2016-11-02: 1 mg via INTRAVENOUS

## 2016-11-02 MED ORDER — HALOPERIDOL LACTATE 5 MG/ML IJ SOLN: 0.5000 mg | INTRAMUSCULAR | Status: DC | PRN

## 2016-11-09 ENCOUNTER — Telehealth: Payer: Self-pay

## 2016-11-09 NOTE — Telephone Encounter (Signed)
On 12/09/16 I received a death certificate from [facility] (original). The death certificate is for burial. The patient is a patient of Doctor Cathlean Cower. The death certificate will be taken to [facility] for signature. On 2016/12/09 I received the death certificate back from Doctor Jenny Reichmann. I got the death certificate ready and called the funeral home to let them know the death certificate is ready for pickup.

## 2016-11-16 NOTE — Progress Notes (Signed)
Responded to page to support family at bedside . Patient is deceased.  Facilitated information between family and staff. Provided emotional, spiritual and grief support. Family is accepting and appears to be coping okay.    2016/11/11 1316  Clinical Encounter Type  Visited With Patient and family together;Health care provider  Visit Type Initial;Spiritual support;Death;ED  Referral From Nurse  Spiritual Encounters  Spiritual Needs Prayer;Emotional;Grief support  Stress Factors  Family Stress Factors Exhausted;Loss  Jaclynn Major, [place]

## 2016-11-16 NOTE — ED Provider Notes (Signed)
[facility] DEPT Provider Note   CSN: [id number] Arrival date & time: 11/06/2016  0759     History   Chief Complaint Chief Complaint  Patient presents with  . Altered Mental Status    HPI Kaylani Lane is a 83 y.o. female.  Patient presents by EMS for evaluation of altered mental status. Patient's daughter saw her this morning in bed, and noted she was unresponsive. Patient's daughter thought she had a seizure because in December she had a similar presentation. Patient's daughter gave her some seizure medicine, this morning after she found her, and apparently the patient was able to swallow it. EMS arrived transferred her here for evaluation. The patient is DO NOT RESUSCITATE  Level V caveat- dementia    HPI  Past Medical History:  Diagnosis Date  . Anxiety   . Arthritis    "joints" (12/02/2013)  . Behavior disturbance   . Cardiomyopathy   . Colon cancer [facility])    daughter denis colon cancer only diverticulitis  . Depression   . Diverticulitis   . Diverticulosis of colon (without mention of hemorrhage) 07/2012  . GERD (gastroesophageal reflux disease)   . Glaucoma   . Herpes zoster   . History of blood transfusion    "related to diverticulitis" (12/02/2013)  . Hypertension   . Hyponatremia 07/2012   Na as low as 114.   Marland Kitchen Hypothyroid   . Internal hemorrhoid 07/2012  . Lumbar back pain   . Myocardial infarction   . Protein calorie malnutrition ([place]) 07/2012   BMI then: 16.5  . Psychosis   . Zenker diverticulum 07/2012   this and esophageal dysmotility on esophagram.     Patient Active Problem List   Diagnosis Date Noted  . UTI (urinary tract infection) 08/23/2016  . Altered mental status 02/08/2016  . Bilateral hearing loss 02/08/2016  . Thrush 11/22/2015  . Syncope 10/23/2015  . Dementia with behavioral disturbance 03/09/2015  . Generalized weakness   . Protein-calorie malnutrition, severe ([place]) 03/08/2015  . General weakness 01/05/2015  . Dark stools  10/20/2014  . Psychosis 09/23/2014  . Decubitus ulcer of buttock, stage 1 09/23/2014  . Abdominal pain 08/06/2014  . Insomnia 03/10/2014  . Lower GI bleed 03/01/2014  . Encounter for preventative adult health care exam with abnormal findings 01/16/2014  . Peripheral edema 01/16/2014  . Dementia 12/02/2013  . Altered mental state 12/02/2013  . Delirium 12/02/2013  . AKI (acute kidney injury) ([place]) 08/10/2013  . Adult failure to thrive 08/17/2012  . Seizure ([place]) 08/17/2012  . End of life care 08/02/2012  . Benign neoplasm of colon 07/19/2012  . Acute lower GI bleeding 07/16/2012  . Anemia associated with acute blood loss 08/23/2011  . Renal insufficiency, mild 06/28/2011  . GERD (gastroesophageal reflux disease) 06/04/2011  . Allergic rhinitis 06/04/2011  . ANGINA, STABLE 12/24/2007  . HERPES ZOSTER 11/27/2007  . Hypothyroidism 11/27/2007  . GLAUCOMA 11/27/2007  . Essential hypertension 11/27/2007  . CARDIOMYOPATHY 11/27/2007  . INTERNAL HEMORRHOIDS 11/27/2007  . Diverticulosis of large intestine 11/27/2007  . BACK PAIN, LUMBAR 11/27/2007  . Other acquired absence of organ 11/27/2007    Past Surgical History:  Procedure Laterality Date  . APPENDECTOMY  2005   ruptured appendix  . CATARACT EXTRACTION W/ INTRAOCULAR LENS  IMPLANT, BILATERAL Bilateral   . COLONOSCOPY  07/19/2012   Procedure: COLONOSCOPY;  Surgeon: Ladene Artist, MD,FACG;  Location: [facility] ENDOSCOPY;  Service: Endoscopy;  Laterality: N/A;  . GLAUCOMA SURGERY Right   . THYROIDECTOMY    .  TUBAL LIGATION      OB History    No data available       Home Medications    Prior to Admission medications   Medication Sig Start Date End Date Taking? Authorizing Provider  acetaminophen (TYLENOL) 500 MG tablet Take 500 mg by mouth every 6 (six) hours as needed for moderate pain.    Historical Provider, MD  Calcium Carbonate-Vit D-Min (CALTRATE 600+D PLUS MINERALS) 600-800 MG-UNIT TABS Take 1 tablet by mouth daily.  11/22/15   Biagio Borg, MD  CARAFATE 1 GM/10ML suspension TAKE 10 MLS (1 G TOTAL) BY MOUTH 4 (FOUR) TIMES DAILY - WITH MEALS AND AT BEDTIME. 09/12/16   Biagio Borg, MD  cefdinir (OMNICEF) 250 MG/5ML suspension Take 6 ml po bid 09/13/16   Lysbeth Penner, FNP  cephALEXin (KEFLEX) 500 MG capsule Take 1 capsule (500 mg total) by mouth 3 (three) times daily. 09/13/16   Lysbeth Penner, FNP  [employer] VITAMIN B12 1000 MCG tablet TAKE 1 TABLET (1,000 MCG TOTAL) BY MOUTH DAILY. 06/05/16   Biagio Borg, MD  isosorbide mononitrate (IMDUR) 30 MG 24 hr tablet TAKE 1 TABLET (30 MG TOTAL) BY MOUTH DAILY. 07/03/16   Biagio Borg, MD  levETIRAcetam (KEPPRA) 100 MG/ML solution Take 5 mLs (500 mg total) by mouth 2 (two) times daily. 08/15/16   Dron Tanna Furry, MD  lisinopril (PRINIVIL,ZESTRIL) 10 MG tablet Take 1 tablet (10 mg total) by mouth daily. 03/30/16   Biagio Borg, MD  polyethylene glycol powder (GLYCOLAX/MIRALAX) powder TAKE 17 G BY MOUTH DAILY. 09/12/16   Biagio Borg, MD  sertraline (ZOLOFT) 25 MG tablet TAKE 1 TABLET (25 MG TOTAL) BY MOUTH DAILY. 05/26/16   Biagio Borg, MD    Family History Family History  Problem Relation Age of Onset  . Stroke Daughter   . Heart attack    . Dementia Sister   . Colon cancer Neg Hx     Social History Social History  Substance Use Topics  . Smoking status: Never Smoker  . Smokeless tobacco: Never Used  . Alcohol use No     Allergies   Patient has no known allergies.   Review of Systems Review of Systems  Unable to perform ROS: Dementia     Physical Exam Updated Vital Signs BP (!) 72/54 (BP Location: Right Arm)   Pulse 77   Temp (!) 96.6 F (35.9 C) (Rectal)   Resp 20   SpO2 100%   Physical Exam  Constitutional: She appears well-developed. She appears toxic.  Elderly, frail  HENT:  Head: Normocephalic and atraumatic.  Eyes:  Very pale conjunctiva. Opaque right cornea. Left pupil 3-4 mm, not reactive. Some eye-movement, but primarily  right deviated  Neck: Phonation normal.  Neck flexible and to the right. Attempt at positioning to improve respirations, with tight musculature, right neck.  Cardiovascular: Normal rate and regular rhythm.   Pulmonary/Chest: No respiratory distress. She exhibits no tenderness.  Shallow respirations. Decreased air movement left base  Abdominal: Soft. She exhibits no distension. There is no tenderness. There is no guarding.  Musculoskeletal: She exhibits no edema or deformity.  Neurological:  Nonverbal. Right gaze preference. Does not follow commands. No abnormal tone in arms or legs.  Skin:  Cool, clammy skin  Psychiatric:  Obtunded  Nursing note and vitals reviewed.    ED Treatments / Results  Labs (all labs ordered are listed, but only abnormal results are displayed) Labs Reviewed  CBC  WITH DIFFERENTIAL/PLATELET  URINALYSIS, ROUTINE W REFLEX MICROSCOPIC  I-STAT CHEM 8, ED    EKG  EKG Interpretation None       Radiology No results found.  Procedures Procedures (including critical care time)  Medications Ordered in ED Medications  sodium chloride 0.9 % bolus 1,000 mL (not administered)     Initial Impression / Assessment and Plan / ED Course  I have reviewed the triage vital signs and the nursing notes.  Pertinent labs & imaging results that were available during my care of the patient were reviewed by me and considered in my medical decision making (see chart for details).  Clinical Course as of Nov 02 1153  Thu Nov 02, 2016  0928 [facility] Chest [place] 1 View [EW]  [phone] DG Chest [place] 1 View [EW]  1122 Discussed the case with patient's daughter regarding the apparent futility of care at this time, in light of her overall status. Patient's daughter agrees with palliative care plan at this point.  [EW]    Clinical Course User Index [EW] Daleen Bo, MD    . Medications  potassium chloride 30 mEq in sodium chloride 0.9 % 265 mL (KCL MULTIRUN) IVPB (30 mEq  Intravenous Given 12/01/16 1033)  levETIRAcetam (KEPPRA) 1,000 mg in sodium chloride 0.9 % 100 mL IVPB (not administered)  sodium chloride 0.9 % bolus 1,000 mL (0 mLs Intravenous Stopped 12/01/16 0914)  LORazepam (ATIVAN) injection 1 mg (1 mg Intravenous Given 2016-12-01 1029)  sodium chloride 0.9 % bolus 500 mL (0 mLs Intravenous Stopped 12/01/2016 1059)  atropine 1 MG/10ML injection 1 mg (1 mg Intravenous Given Dec 01, 2016 1144)    Patient Vitals for the past 24 hrs:  BP Temp Temp src Pulse Resp SpO2  12/01/16 1131 (!) 44/32 - - - 11 -  Dec 01, 2016 1115 (!) 57/41 - - - 14 -  12/01/16 1057 (!) 62/48 - - - 22 -  01-Dec-2016 0907 (!) 78/56 - - 94 26 -  12/01/16 0900 (!) 62/51 - - - 18 -  Dec 01, 2016 0845 104/73 - - 98 18 -  12-01-2016 0830 91/69 - - - 26 -  12/01/2016 0809 (!) 72/54 (!) 96.6 F (35.9 C) Rectal 77 20 100 %     11:50- case discussed with admitting hospitalist, regarding patient's status as actively dying. He will initiate orders for admission. Continue with palliative care approach to this patient.    Final Clinical Impressions(s) / ED Diagnoses   Final diagnoses:  Altered mental status, unspecified altered mental status type  Other specified hypotension  DNR (do not resuscitate)  Hypokalemia    Patient presented with altered mental status, cause unclear.  Differential includes that of recurrence, CVA, severe dehydration.  Patient treated symptomatically in the ED.  Also received treatment for hypokalemia.  Patient developed progressive hypotension unresponsive to fluid resuscitation.  She then became bradycardic which was treated with atropine, without improvement.  Patient then went into asystole and she was pronounced dead by me here in the emergency department.  I discussed the case with the on-call hospitalist who stated he would sign the death certificate.  Nursing Notes Reviewed/ Care Coordinated Applicable Imaging Reviewed Interpretation of Laboratory Data incorporated into ED  treatment  Disposition-expired, patient transferred to the morgue  New Prescriptions New Prescriptions   No medications on file     Daleen Bo, MD 12/01/16 1711

## 2016-11-16 NOTE — ED Notes (Signed)
The Nurse was informed of pt. Critical Lab results of Chem 8.

## 2016-11-16 NOTE — H&P (Signed)
[facility] History and Physical  [name] [id number] DOB: November 04, 1916 DOA: November 12, 2016  Referring physician:  PCP: Cathlean Cower, MD   Chief Complaint: "I found her this morning and she hasn't set a word."-Daughter  HPI: [name] is a 83 y.o. female  with past medical history of anxiety, cardio myopathy, colon cancer, depression, dementia, hypertension, hypothyroidism who presents emergency room with altered mental status. Daughter states that the patient has been her normal state of health for the last week. No medication changes. No acute illnesses. Patient was well last night. His morning patient's daughter found her weekly responsive. Patient will motion to speak but nothing came out. By the time EMS arrived patient was completely unresponsive. Patient was brought to the emergency room for evaluation.  ED course: Patient was given 2 boluses of IV fluid fluid and a CT head was performed which was negative for acute infarct. Chest x-ray didn't show acute processes. EKG did not show STEMI. Patient remained unresponsive in emergency room. Family repeatedly stated patient is to be DO NOT RESUSCITATE. Hospitalists were consulted for admission for end-of-life care.   Review of Systems:  As per HPI otherwise 10 point review of systems negative.    Past Medical History:  Diagnosis Date  . Anxiety   . Arthritis    "joints" (12/02/2013)  . Behavior disturbance   . Cardiomyopathy   . Colon cancer [facility])    daughter denis colon cancer only diverticulitis  . Depression   . Diverticulitis   . Diverticulosis of colon (without mention of hemorrhage) 07/2012  . GERD (gastroesophageal reflux disease)   . Glaucoma   . Herpes zoster   . History of blood transfusion    "related to diverticulitis" (12/02/2013)  . Hypertension   . Hyponatremia 07/2012   Na as low as 114.   Marland Kitchen Hypothyroid   . Internal hemorrhoid 07/2012  . Lumbar back pain   . Myocardial infarction   . Protein  calorie malnutrition ([place]) 07/2012   BMI then: 16.5  . Psychosis   . Zenker diverticulum 07/2012   this and esophageal dysmotility on esophagram.    Past Surgical History:  Procedure Laterality Date  . APPENDECTOMY  2005   ruptured appendix  . CATARACT EXTRACTION W/ INTRAOCULAR LENS  IMPLANT, BILATERAL Bilateral   . COLONOSCOPY  07/19/2012   Procedure: COLONOSCOPY;  Surgeon: Ladene Artist, MD,FACG;  Location: [facility] ENDOSCOPY;  Service: Endoscopy;  Laterality: N/A;  . GLAUCOMA SURGERY Right   . THYROIDECTOMY    . TUBAL LIGATION     Social History:  reports that she has never smoked. She has never used smokeless tobacco. She reports that she does not drink alcohol or use drugs.  No Known Allergies  Family History  Problem Relation Age of Onset  . Stroke Daughter   . Heart attack    . Dementia Sister   . Colon cancer Neg Hx      Prior to Admission medications   Medication Sig Start Date End Date Taking? Authorizing Provider  acetaminophen (TYLENOL) 500 MG tablet Take 500 mg by mouth every 6 (six) hours as needed for moderate pain.   Yes Historical Provider, MD  Calcium Carbonate-Vit D-Min (CALTRATE 600+D PLUS MINERALS) 600-800 MG-UNIT TABS Take 1 tablet by mouth daily. 11/22/15  Yes Biagio Borg, MD  CARAFATE 1 GM/10ML suspension TAKE 10 MLS (1 G TOTAL) BY MOUTH 4 (FOUR) TIMES DAILY - WITH MEALS AND AT BEDTIME. 09/12/16  Yes Biagio Borg, MD  [facility]  VITAMIN B12 1000 MCG tablet TAKE 1 TABLET (1,000 MCG TOTAL) BY MOUTH DAILY. 06/05/16  Yes Biagio Borg, MD  donepezil (ARICEPT) 5 MG tablet Take 5 mg by mouth at bedtime. 09/08/16  Yes Historical Provider, MD  isosorbide mononitrate (IMDUR) 30 MG 24 hr tablet TAKE 1 TABLET (30 MG TOTAL) BY MOUTH DAILY. 07/03/16  Yes Biagio Borg, MD  levETIRAcetam (KEPPRA) 100 MG/ML solution Take 5 mLs (500 mg total) by mouth 2 (two) times daily. 08/15/16  Yes Dron Tanna Furry, MD  levothyroxine (SYNTHROID, LEVOTHROID) 88 MCG tablet Take 88 mcg by mouth  daily. 09/28/16  Yes Historical Provider, MD  lisinopril (PRINIVIL,ZESTRIL) 10 MG tablet Take 1 tablet (10 mg total) by mouth daily. 03/30/16  Yes Biagio Borg, MD  polyethylene glycol powder (GLYCOLAX/MIRALAX) powder TAKE 17 G BY MOUTH DAILY. 09/12/16  Yes Biagio Borg, MD  sertraline (ZOLOFT) 25 MG tablet TAKE 1 TABLET (25 MG TOTAL) BY MOUTH DAILY. 05/26/16  Yes Biagio Borg, MD  cefdinir (OMNICEF) 250 MG/5ML suspension Take 6 ml po bid Patient not taking: Reported on 11/07/2016 09/13/16   Lysbeth Penner, FNP  cephALEXin (KEFLEX) 500 MG capsule Take 1 capsule (500 mg total) by mouth 3 (three) times daily. Patient not taking: Reported on 2016-11-07 09/13/16   Lysbeth Penner, FNP   Physical Exam: Vitals:   11-07-2016 1057 07-Nov-2016 1115 2016-11-07 1131 11-07-16 1208  BP: (!) 62/48 (!) 57/41 (!) 44/32 (!) 74/54  Pulse:      Resp: 22 14 11 21   Temp:      TempSrc:      SpO2:        Wt Readings from Last 3 Encounters:  08/23/16 35.8 kg (79 lb)  02/08/16 38.6 kg (85 lb)  11/22/15 40.4 kg (89 lb)    General:  Appears comfortable, Patient is unresponsive Eyes:  Pupils equal and minimally reactive to light, unable to assess extraocular movements, normal lids, iris ENT:  grossly normal  lips & tongue Neck:  no LAD, masses or thyromegaly Cardiovascular:  Tachycardia into the 130s, regular rhythm, no m/r/g. No LE edema.  Respiratory:  Borderline agonal respirations, rhonchorous breath sounds Abdomen:  soft, ntnd Skin:  no rash or induration seen on limited exam Musculoskeletal:  Unable to assess Psychiatric:  Unable to assess Neurologic:  Unable to assess           Labs on Admission:  Basic Metabolic Panel:  Recent Labs Lab 2016-11-07 0846  NA 143  K 2.3*  CL 101  GLUCOSE 182*  BUN 15  CREATININE 1.00   Liver Function Tests: No results for input(s): AST, ALT, ALKPHOS, BILITOT, PROT, ALBUMIN in the last 168 hours. No results for input(s): LIPASE, AMYLASE in the last 168 hours. No  results for input(s): AMMONIA in the last 168 hours. CBC:  Recent Labs Lab 11/07/16 0833 11/07/16 0846  WBC 6.3  --   NEUTROABS 4.3  --   HGB 9.1* 9.5*  HCT 29.7* 28.0*  MCV 84.6  --   PLT 217  --    Cardiac Enzymes: No results for input(s): CKTOTAL, CKMB, CKMBINDEX, TROPONINI in the last 168 hours.  BNP (last 3 results) No results for input(s): BNP in the last 8760 hours.  ProBNP (last 3 results) No results for input(s): PROBNP in the last 8760 hours.   Creatinine clearance cannot be calculated (Unknown ideal weight.)  CBG: No results for input(s): GLUCAP in the last 168 hours.  Radiological Exams on  Admission: Ct Head Wo Contrast  Result Date: 11-14-2016 CLINICAL DATA:  83 year old female with altered mental status. Possible seizure. Initial encounter. EXAM: CT HEAD WITHOUT CONTRAST TECHNIQUE: Contiguous axial images were obtained from the base of the skull through the vertex without intravenous contrast. COMPARISON:  Brain MRI 08/11/2016, head CT 08/09/2016 and earlier. FINDINGS: Brain: No midline shift, mass effect, or evidence of intracranial mass lesion. No acute intracranial hemorrhage identified. Stable ventricle size and configuration. Patchy and confluent bilateral cerebral white matter hypodensity appears stable from the prior studies. Involvement of the deep white matter capsules as before. Deep gray matter nuclei appears stable. No cortically based acute infarct identified. No cortical encephalomalacia identified. Vascular: Calcified atherosclerosis at the skull base. Skull: No acute osseous abnormality identified. Sinuses/Orbits: Visualized paranasal sinuses and mastoids are stable and well pneumatized. Other: No acute orbit or scalp soft tissue finding. IMPRESSION: No acute intracranial abnormality. Stable non contrast CT appearance of the brain with chronic white matter disease. Electronically Signed   By: Genevie Ann M.D.   On: 11-14-16 10:02   Dg Chest Port 1  View  Result Date: 11/14/16 CLINICAL DATA:  Altered mental status. EXAM: PORTABLE CHEST 1 VIEW COMPARISON:  08/15/2016 FINDINGS: Portable supine view of the chest was obtained. The patient is rotated towards the right which limits evaluation of the mediastinum. Lungs are clear without airspace disease or pulmonary edema. Atherosclerotic calcifications at the aortic arch. Heart size is mildly enlarged and stable from the previous examination. Negative for a pneumothorax. IMPRESSION: No acute chest findings. Electronically Signed   By: Markus Daft M.D.   On: 11/14/2016 08:53    EKG: Independently reviewed. No STEMI  Assessment/Plan Active Problems:   End of life care  Patient will likely pass in the next 48 hours, pt with Sepsis End of life protocol activated Past care consult KVO fluids Tylenol when necessary fever Albuterol when necessary shortness of breath Thorazine when necessary he comes Benadryl when necessary itching Zofran when necessary nausea Air mattress   Code Status: DO NOT RESUSCITATE DVT Prophylaxis: None due to end-of-life care Family Communication: 2 daughters at bedside Disposition Plan: Pending Improvement  Status: MEDSURG obs  Elwin Mocha, MD Family Medicine [facility] [email or web address] Password [email or web address]

## 2016-11-16 DEATH — deceased

## 2016-11-20 ENCOUNTER — Ambulatory Visit: Payer: Self-pay | Admitting: Neurology

## 2017-02-11 IMAGING — CT CT HEAD W/O CM
2 of 4 series · 17 of 30 positions shown, 20 images · non-contrast
Comparison: 12/02/2013

CLINICAL DATA: Weakness

EXAM:
CT HEAD WITHOUT CONTRAST
TECHNIQUE: Contiguous axial images were obtained from the base of the skull
through the vertex without intravenous contrast.

[Series 2: head w/o · axial · non-contrast · 0.45mm/px · z∈[+1540,+1650]mm · 10 of 28 slices shown, 13 images]
[im 3/28  brain]
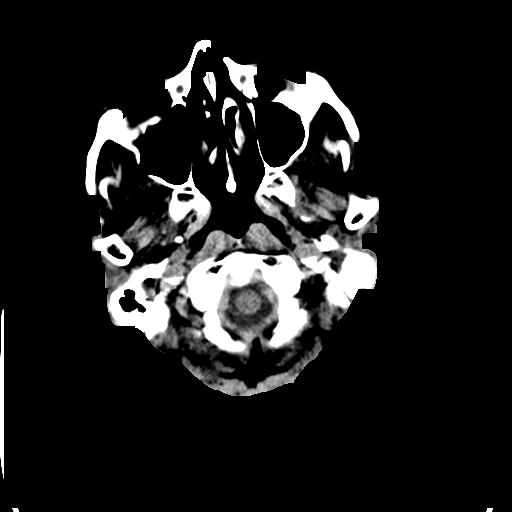
[im 3/28  bone]
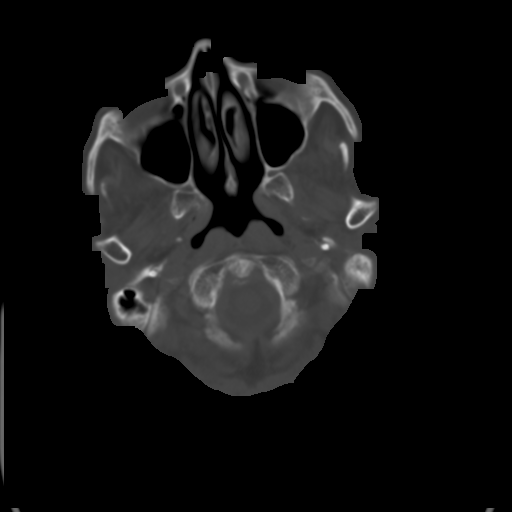
[im 5/28  brain]
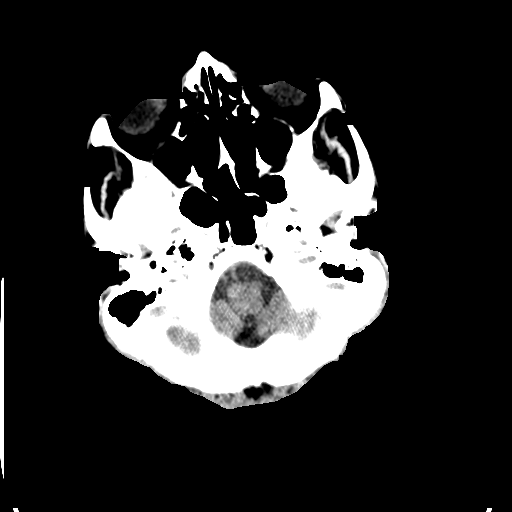
[im 8/28  brain]
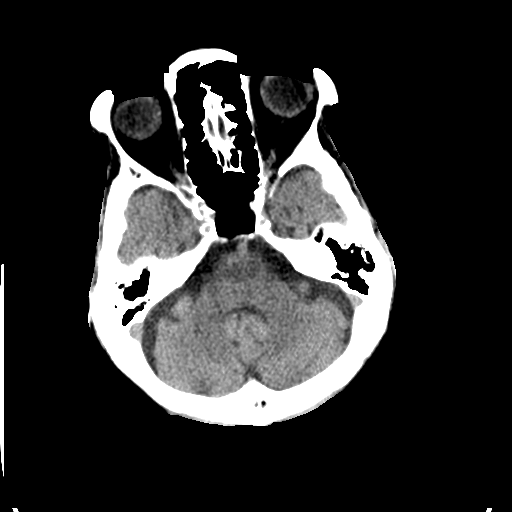
[im 10/28  brain]
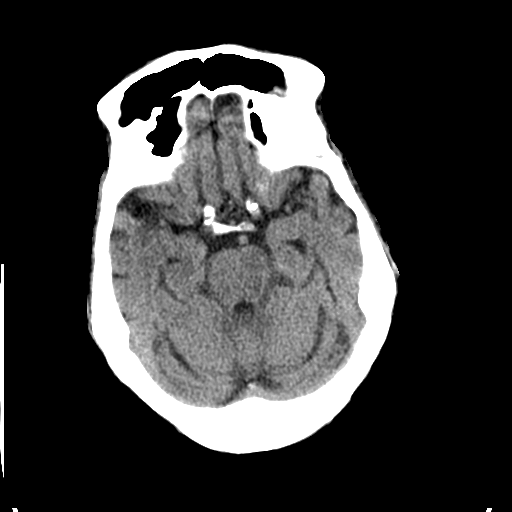
[im 13/28  brain]
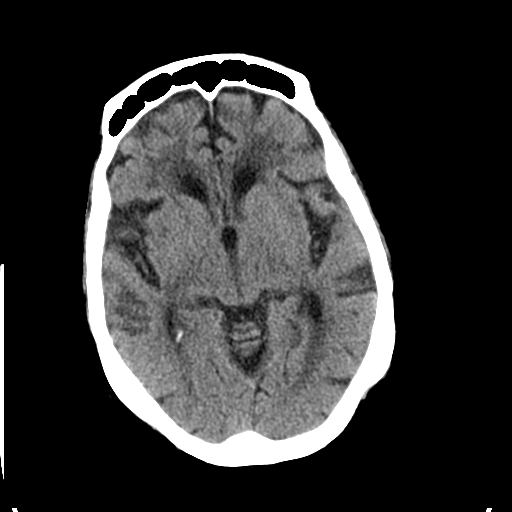
[im 13/28  bone]
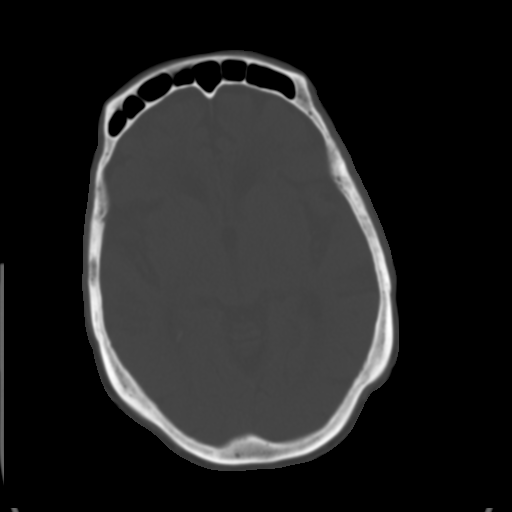
[im 15/28  brain]
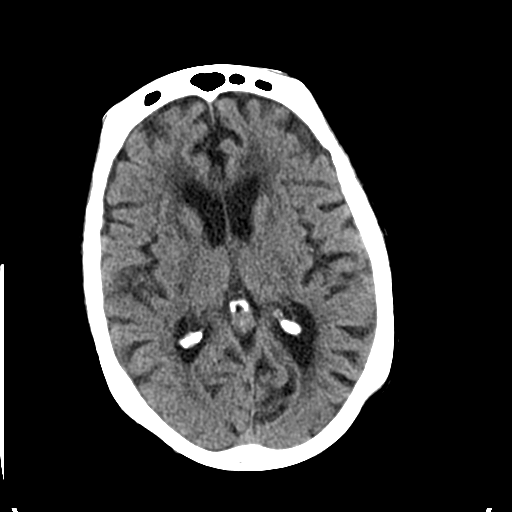
[im 18/28  brain]
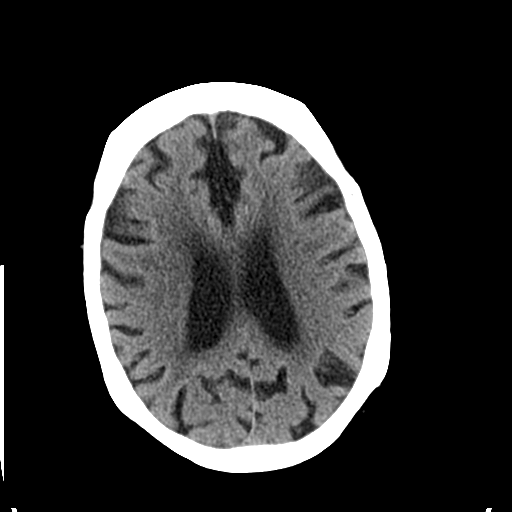
[im 20/28  brain]
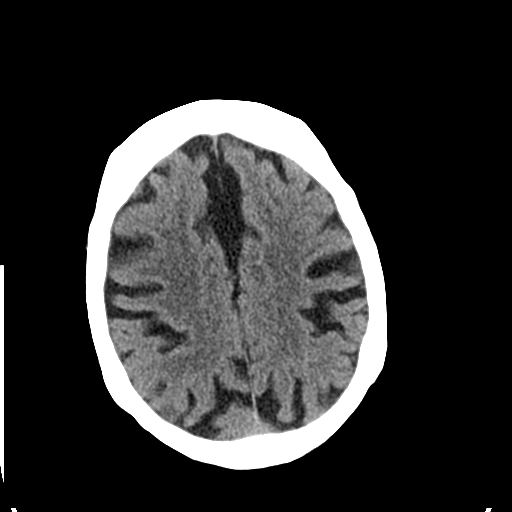
[im 23/28  brain]
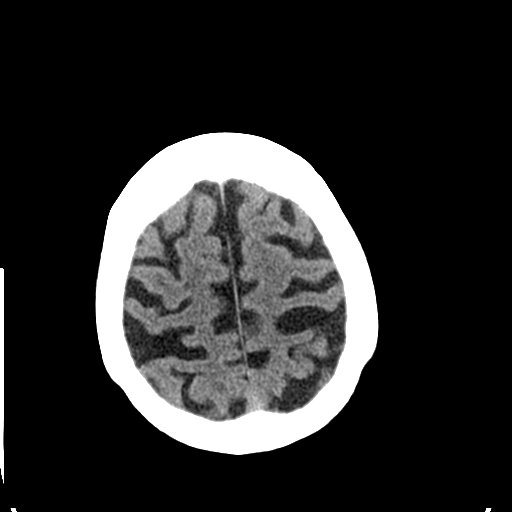
[im 23/28  bone]
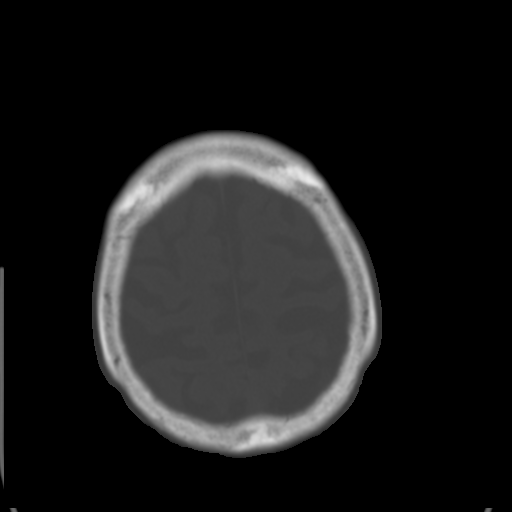
[im 25/28  brain]
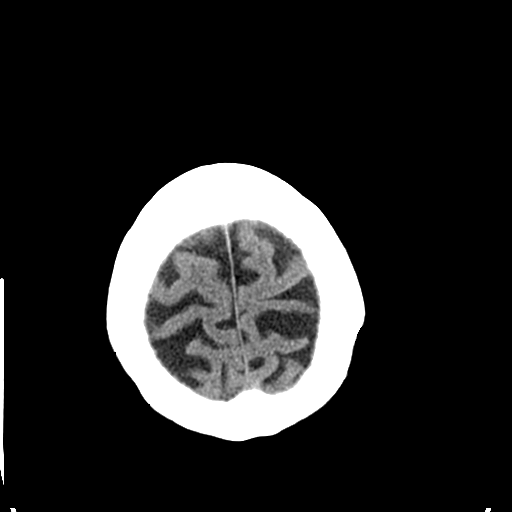

[Series 3: bone windows · axial · 0.45mm/px · z∈[+1540,+1640]mm · 7 of 28 slices shown]
[im 3/28  bone]
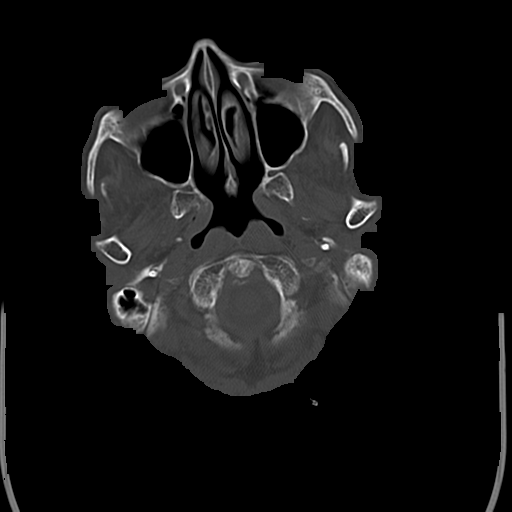
[im 5/28  bone]
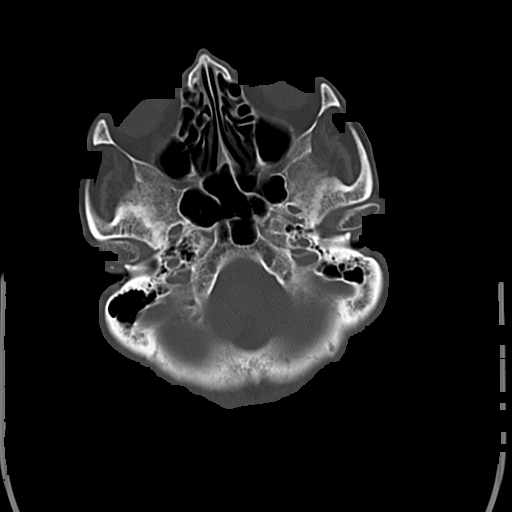
[im 10/28  bone]
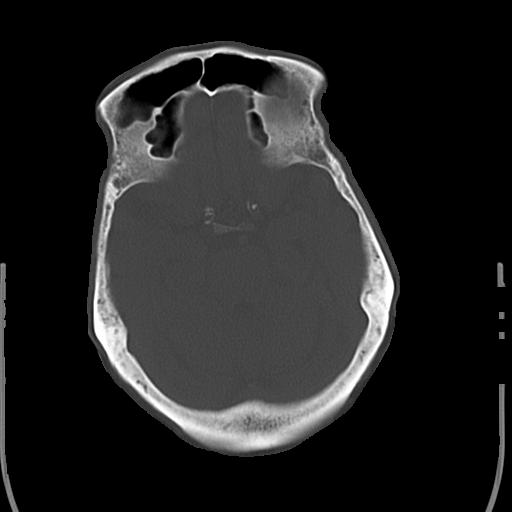
[im 13/28  bone]
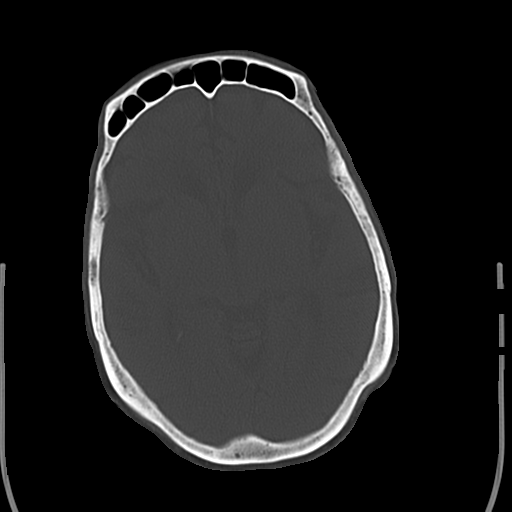
[im 15/28  bone]
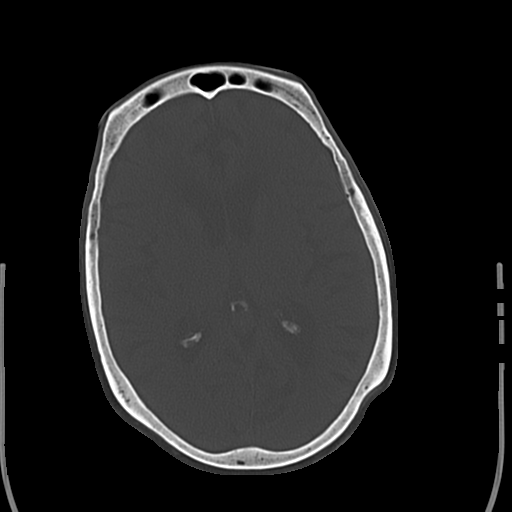
[im 18/28  bone]
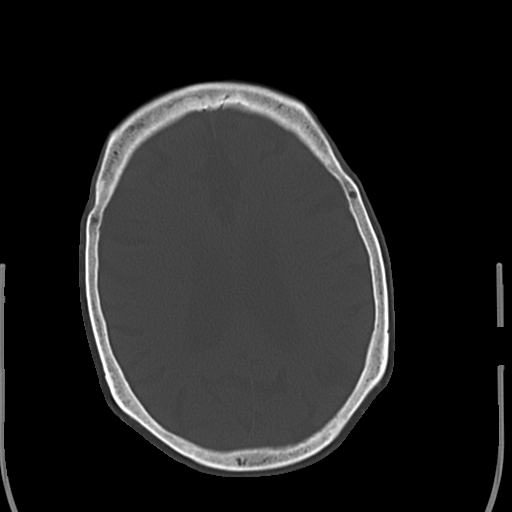
[im 23/28  bone]
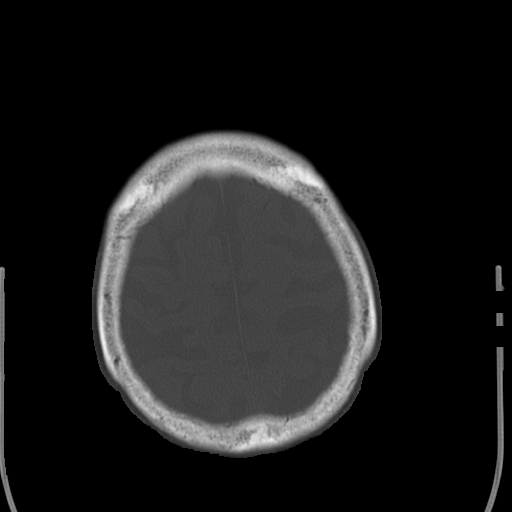

[17 of 30 positions shown; findings below may reference images not displayed]

FINDINGS: Global atrophy. Chronic ischemic changes in the periventricular
white matter and brainstem.

No mass effect, midline shift, or acute hemorrhage.

Left lens is absent likely due to postoperative change.

Mastoid air cells are clear.  Visualized sinuses are clear.
IMPRESSION: No acute intracranial pathology.

## 2017-02-11 IMAGING — CR DG CHEST 2V
2 series · 2 of 2 positions shown · non-contrast
Comparison: 03/10/2014

CLINICAL DATA: Confusion and weakness for 2 days

EXAM:
CHEST  2 VIEW

[w chest lat]
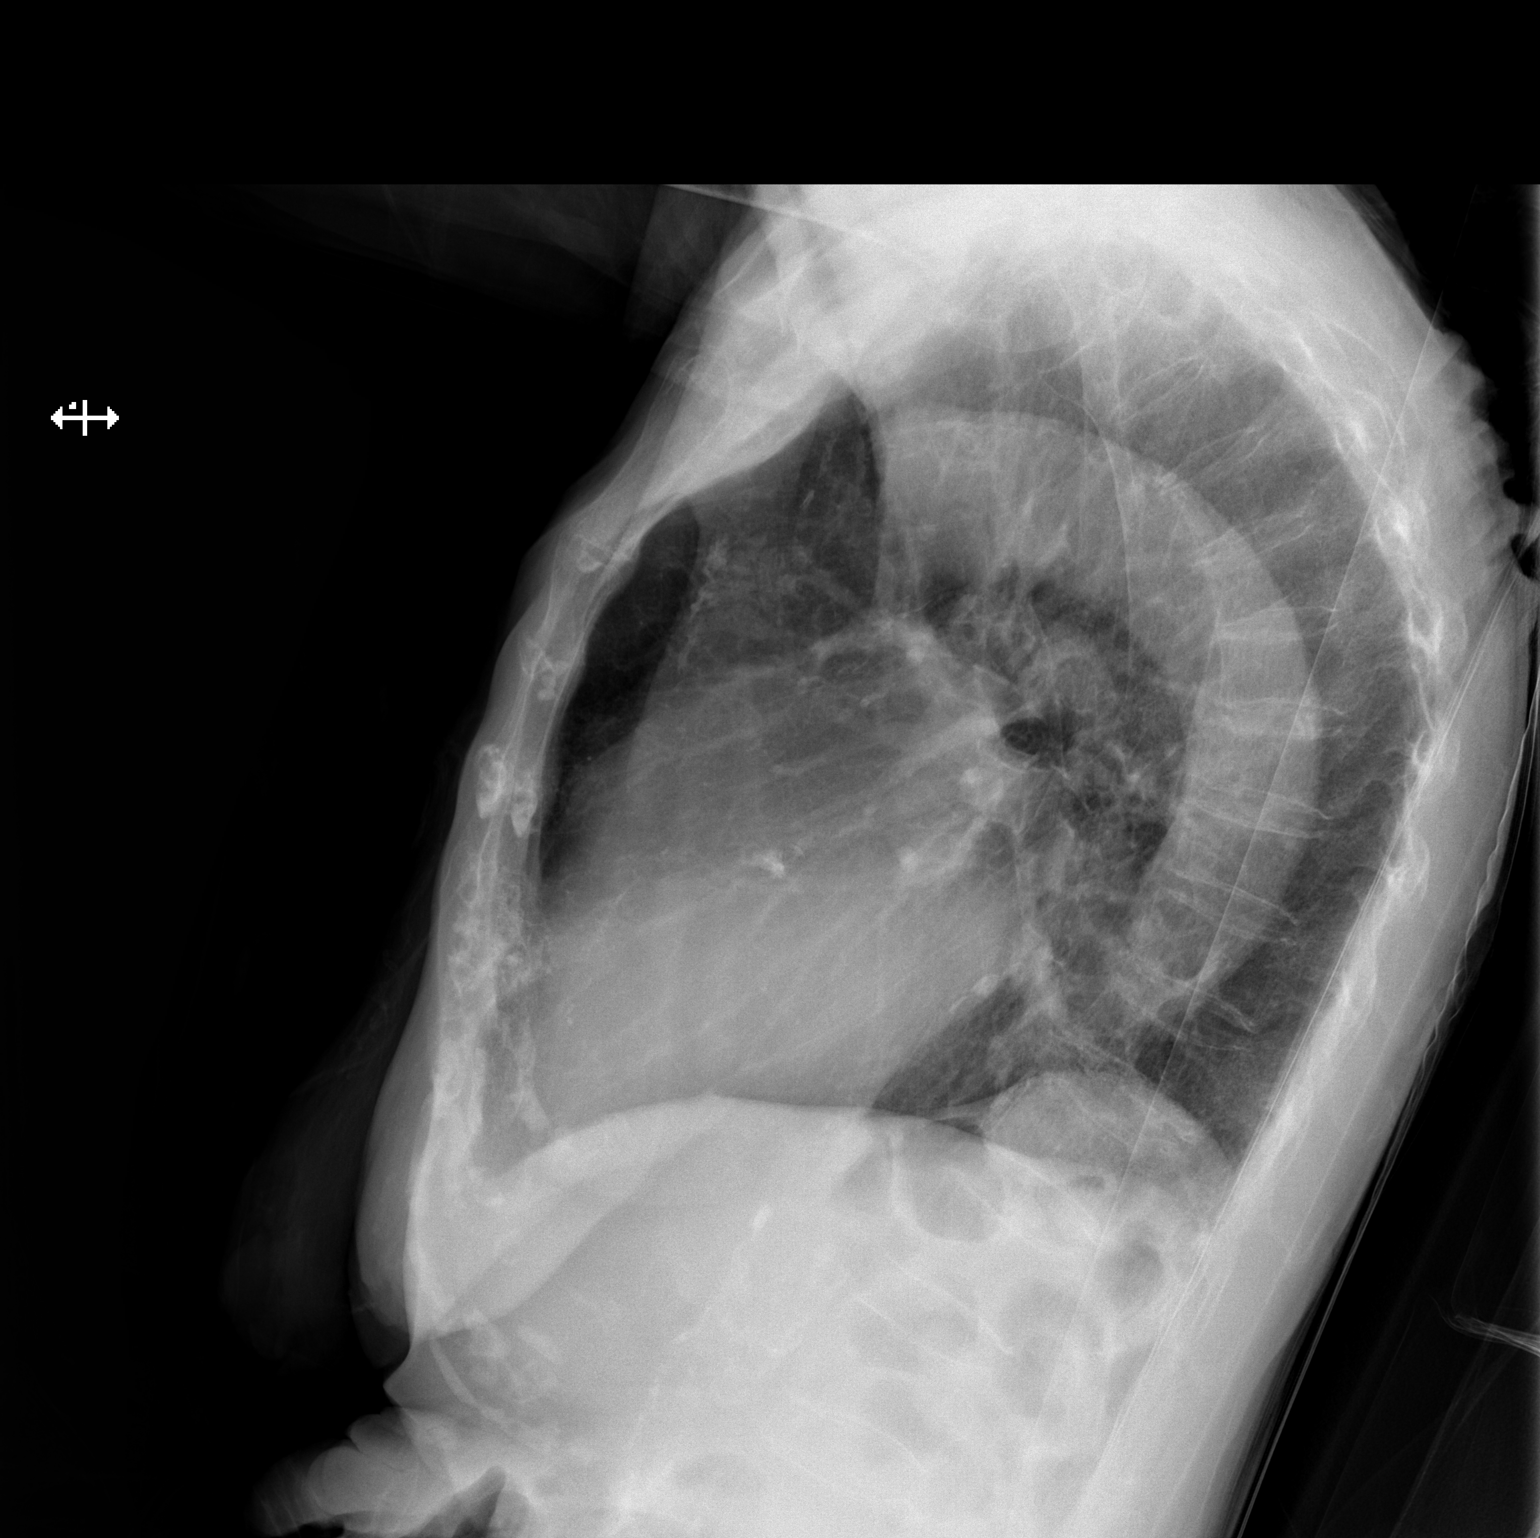

[x chest ap]
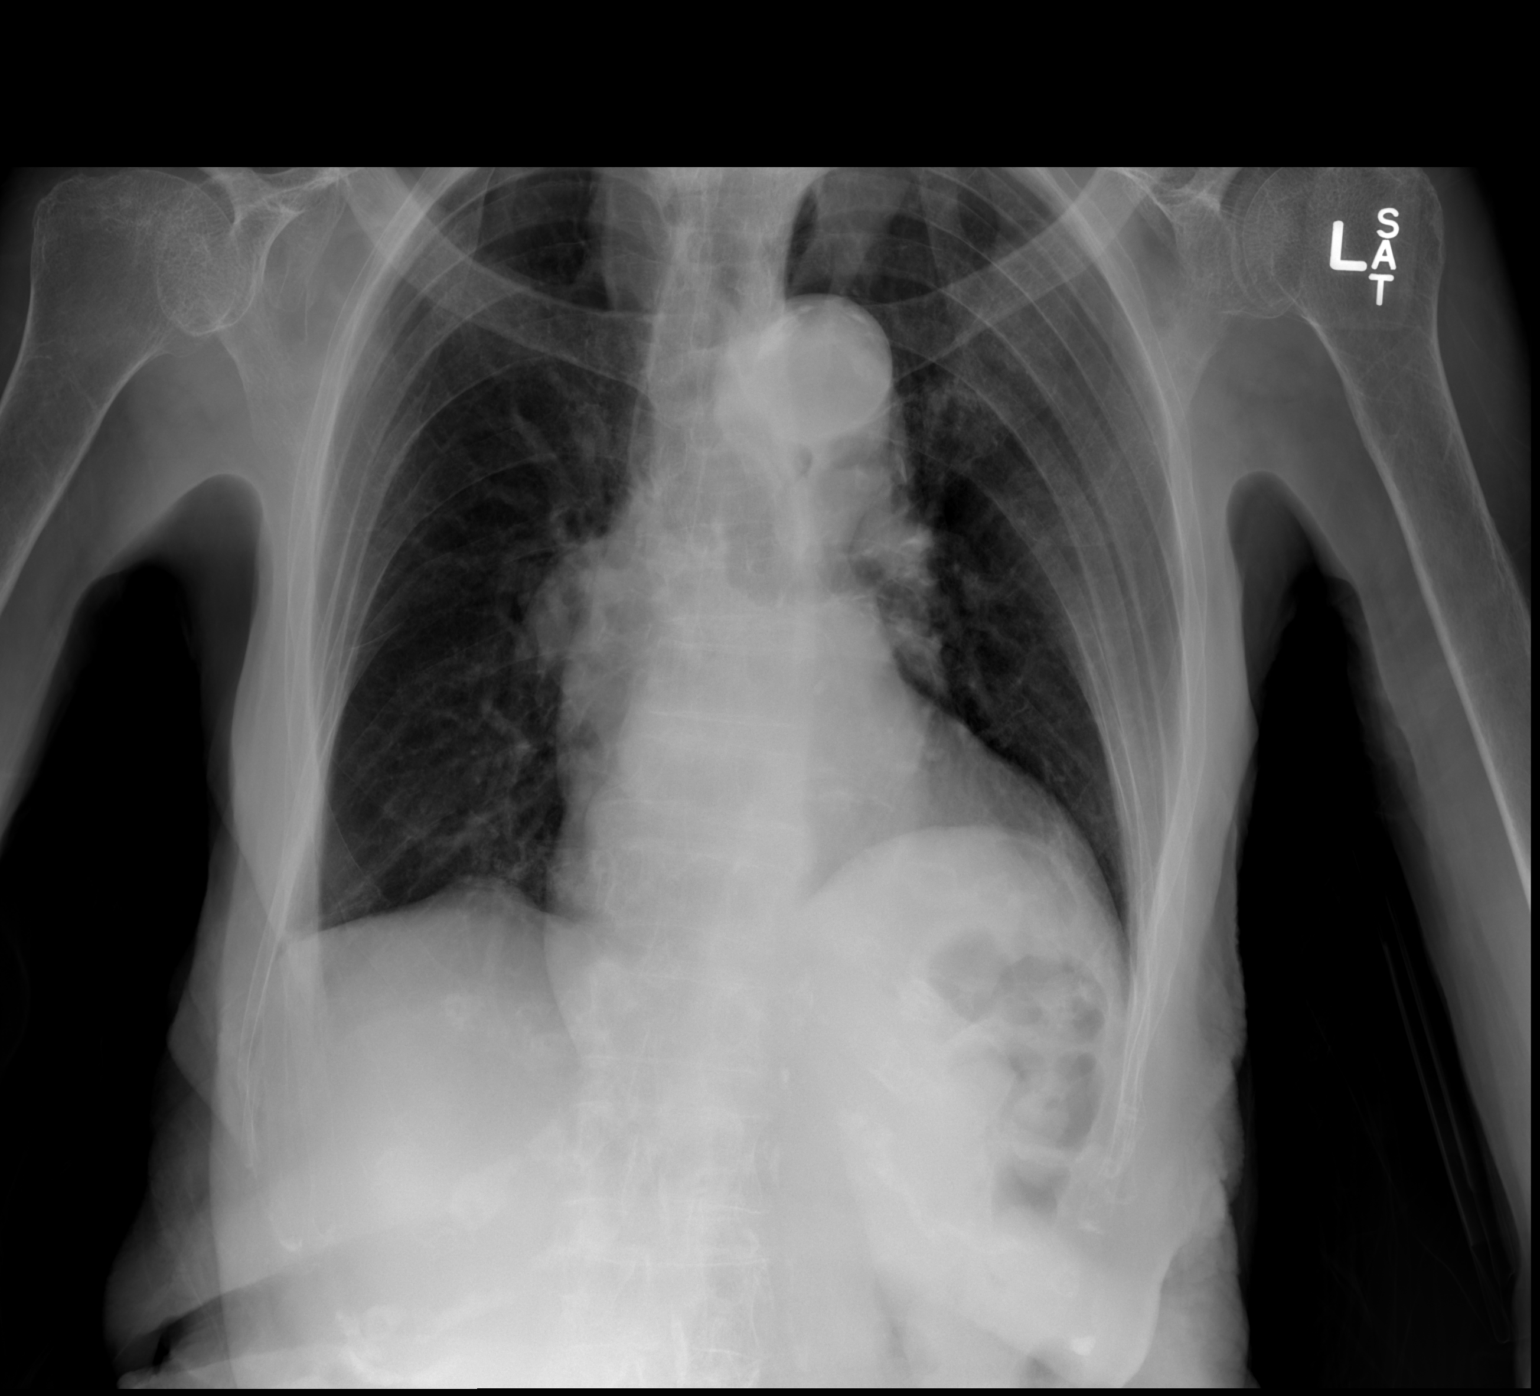

[2 of 2 positions shown; findings below may reference images not displayed]

FINDINGS: The heart is markedly enlarged. Central pulmonary vasculature is
prominent with peripheral pruning. No consolidation or mass.
Hyperaeration. Stable thoracic spine with osteopenia.
IMPRESSION: No active cardiopulmonary disease.

## 2017-02-21 ENCOUNTER — Encounter: Payer: Self-pay | Admitting: Internal Medicine
